# Patient Record
Sex: Male | Born: 1945
Health system: Southern US, Community
[De-identification: ages and names within clinical notes are randomized; demographics above are authoritative.]

## PROBLEM LIST (undated history)

## (undated) DIAGNOSIS — C140 Malignant neoplasm of pharynx, unspecified: Secondary | ICD-10-CM

## (undated) DIAGNOSIS — E669 Obesity, unspecified: Secondary | ICD-10-CM

## (undated) DIAGNOSIS — M47815 Spondylosis without myelopathy or radiculopathy, thoracolumbar region: Secondary | ICD-10-CM

## (undated) DIAGNOSIS — J439 Emphysema, unspecified: Secondary | ICD-10-CM

## (undated) DIAGNOSIS — K5790 Diverticulosis of intestine, part unspecified, without perforation or abscess without bleeding: Secondary | ICD-10-CM

## (undated) DIAGNOSIS — I5020 Unspecified systolic (congestive) heart failure: Secondary | ICD-10-CM

## (undated) DIAGNOSIS — M1990 Unspecified osteoarthritis, unspecified site: Secondary | ICD-10-CM

## (undated) DIAGNOSIS — D494 Neoplasm of unspecified behavior of bladder: Secondary | ICD-10-CM

## (undated) DIAGNOSIS — E119 Type 2 diabetes mellitus without complications: Secondary | ICD-10-CM

## (undated) DIAGNOSIS — N400 Enlarged prostate without lower urinary tract symptoms: Secondary | ICD-10-CM

## (undated) DIAGNOSIS — I452 Bifascicular block: Secondary | ICD-10-CM

## (undated) DIAGNOSIS — I6789 Other cerebrovascular disease: Secondary | ICD-10-CM

## (undated) DIAGNOSIS — R251 Tremor, unspecified: Secondary | ICD-10-CM

## (undated) DIAGNOSIS — I2510 Atherosclerotic heart disease of native coronary artery without angina pectoris: Secondary | ICD-10-CM

## (undated) DIAGNOSIS — E1142 Type 2 diabetes mellitus with diabetic polyneuropathy: Secondary | ICD-10-CM

## (undated) DIAGNOSIS — I700 Atherosclerosis of aorta: Secondary | ICD-10-CM

## (undated) DIAGNOSIS — I10 Essential (primary) hypertension: Secondary | ICD-10-CM

## (undated) DIAGNOSIS — M5440 Lumbago with sciatica, unspecified side: Secondary | ICD-10-CM

## (undated) DIAGNOSIS — Z7982 Long term (current) use of aspirin: Secondary | ICD-10-CM

## (undated) DIAGNOSIS — G629 Polyneuropathy, unspecified: Secondary | ICD-10-CM

## (undated) DIAGNOSIS — H9190 Unspecified hearing loss, unspecified ear: Secondary | ICD-10-CM

## (undated) DIAGNOSIS — M47812 Spondylosis without myelopathy or radiculopathy, cervical region: Secondary | ICD-10-CM

## (undated) DIAGNOSIS — I6523 Occlusion and stenosis of bilateral carotid arteries: Secondary | ICD-10-CM

## (undated) DIAGNOSIS — Z87442 Personal history of urinary calculi: Secondary | ICD-10-CM

## (undated) DIAGNOSIS — K439 Ventral hernia without obstruction or gangrene: Secondary | ICD-10-CM

## (undated) DIAGNOSIS — M549 Dorsalgia, unspecified: Secondary | ICD-10-CM

## (undated) DIAGNOSIS — C679 Malignant neoplasm of bladder, unspecified: Secondary | ICD-10-CM

## (undated) DIAGNOSIS — C4491 Basal cell carcinoma of skin, unspecified: Secondary | ICD-10-CM

## (undated) DIAGNOSIS — C649 Malignant neoplasm of unspecified kidney, except renal pelvis: Secondary | ICD-10-CM

## (undated) DIAGNOSIS — G8929 Other chronic pain: Secondary | ICD-10-CM

## (undated) DIAGNOSIS — I509 Heart failure, unspecified: Secondary | ICD-10-CM

## (undated) HISTORY — PX: COLONOSCOPY: SHX174

## (undated) HISTORY — PX: OTHER SURGICAL HISTORY: SHX169

## (undated) HISTORY — PX: APPENDECTOMY: SHX54

## (undated) HISTORY — PX: CERVICAL SPINE SURGERY: SHX589

## (undated) HISTORY — PX: NEPHRECTOMY: SHX65

## (undated) HISTORY — PX: BACK SURGERY: SHX140

## (undated) HISTORY — PX: BLADDER SURGERY: SHX569

## (undated) HISTORY — PX: HERNIA REPAIR: SHX51

---

## 1988-12-28 HISTORY — PX: THROAT SURGERY: SHX803

## 2005-07-31 ENCOUNTER — Ambulatory Visit: Payer: Self-pay

## 2005-09-03 ENCOUNTER — Other Ambulatory Visit: Payer: Self-pay

## 2005-09-17 ENCOUNTER — Ambulatory Visit: Payer: Self-pay | Admitting: Urology

## 2005-10-13 ENCOUNTER — Inpatient Hospital Stay: Payer: Self-pay | Admitting: Urology

## 2005-11-06 ENCOUNTER — Ambulatory Visit: Payer: Self-pay | Admitting: Oncology

## 2005-11-27 ENCOUNTER — Ambulatory Visit: Payer: Self-pay | Admitting: Oncology

## 2005-12-28 ENCOUNTER — Ambulatory Visit: Payer: Self-pay | Admitting: Oncology

## 2006-01-28 ENCOUNTER — Ambulatory Visit: Payer: Self-pay | Admitting: Oncology

## 2006-02-12 ENCOUNTER — Ambulatory Visit: Payer: Self-pay

## 2006-05-19 ENCOUNTER — Ambulatory Visit: Payer: Self-pay | Admitting: Oncology

## 2006-05-28 ENCOUNTER — Ambulatory Visit: Payer: Self-pay | Admitting: Oncology

## 2006-06-24 ENCOUNTER — Ambulatory Visit: Payer: Self-pay | Admitting: Physician Assistant

## 2006-06-27 ENCOUNTER — Ambulatory Visit: Payer: Self-pay | Admitting: Oncology

## 2006-07-22 ENCOUNTER — Other Ambulatory Visit: Payer: Self-pay

## 2006-07-29 ENCOUNTER — Ambulatory Visit: Payer: Self-pay | Admitting: Urology

## 2006-10-18 ENCOUNTER — Ambulatory Visit: Payer: Self-pay | Admitting: Oncology

## 2006-11-10 ENCOUNTER — Ambulatory Visit: Payer: Self-pay | Admitting: Gastroenterology

## 2007-01-06 ENCOUNTER — Ambulatory Visit: Payer: Self-pay | Admitting: Pain Medicine

## 2007-01-19 ENCOUNTER — Ambulatory Visit: Payer: Self-pay | Admitting: Pain Medicine

## 2007-02-01 ENCOUNTER — Ambulatory Visit: Payer: Self-pay | Admitting: Oncology

## 2007-02-26 ENCOUNTER — Ambulatory Visit: Payer: Self-pay | Admitting: Oncology

## 2007-03-03 ENCOUNTER — Ambulatory Visit: Payer: Self-pay | Admitting: Pain Medicine

## 2007-03-04 ENCOUNTER — Ambulatory Visit: Payer: Self-pay | Admitting: Urology

## 2007-03-14 ENCOUNTER — Ambulatory Visit: Payer: Self-pay | Admitting: Pain Medicine

## 2007-05-31 ENCOUNTER — Ambulatory Visit: Payer: Self-pay | Admitting: Urology

## 2007-05-31 ENCOUNTER — Other Ambulatory Visit: Payer: Self-pay

## 2007-06-14 ENCOUNTER — Ambulatory Visit: Payer: Self-pay | Admitting: Urology

## 2007-07-12 ENCOUNTER — Inpatient Hospital Stay: Payer: Self-pay | Admitting: Urology

## 2009-04-26 ENCOUNTER — Ambulatory Visit: Payer: Self-pay | Admitting: Urology

## 2010-12-17 ENCOUNTER — Ambulatory Visit: Payer: Self-pay | Admitting: Internal Medicine

## 2011-05-06 ENCOUNTER — Ambulatory Visit: Payer: Self-pay | Admitting: Urology

## 2012-10-10 DIAGNOSIS — N138 Other obstructive and reflux uropathy: Secondary | ICD-10-CM | POA: Insufficient documentation

## 2013-07-07 ENCOUNTER — Ambulatory Visit: Payer: Self-pay | Admitting: Urology

## 2013-07-07 DIAGNOSIS — R053 Chronic cough: Secondary | ICD-10-CM | POA: Insufficient documentation

## 2013-07-07 DIAGNOSIS — Z8554 Personal history of malignant neoplasm of ureter: Secondary | ICD-10-CM | POA: Insufficient documentation

## 2014-01-05 ENCOUNTER — Ambulatory Visit: Payer: Self-pay | Admitting: Gastroenterology

## 2014-01-09 LAB — PATHOLOGY REPORT

## 2014-07-04 DIAGNOSIS — K439 Ventral hernia without obstruction or gangrene: Secondary | ICD-10-CM | POA: Insufficient documentation

## 2014-07-04 DIAGNOSIS — E785 Hyperlipidemia, unspecified: Secondary | ICD-10-CM | POA: Insufficient documentation

## 2014-07-04 DIAGNOSIS — E119 Type 2 diabetes mellitus without complications: Secondary | ICD-10-CM | POA: Insufficient documentation

## 2014-07-04 DIAGNOSIS — G250 Essential tremor: Secondary | ICD-10-CM | POA: Insufficient documentation

## 2014-07-04 DIAGNOSIS — I10 Essential (primary) hypertension: Secondary | ICD-10-CM | POA: Insufficient documentation

## 2014-07-04 DIAGNOSIS — M549 Dorsalgia, unspecified: Secondary | ICD-10-CM | POA: Insufficient documentation

## 2016-09-01 DIAGNOSIS — D414 Neoplasm of uncertain behavior of bladder: Secondary | ICD-10-CM | POA: Insufficient documentation

## 2016-10-29 DIAGNOSIS — D692 Other nonthrombocytopenic purpura: Secondary | ICD-10-CM | POA: Insufficient documentation

## 2017-04-29 DIAGNOSIS — Z86010 Personal history of colonic polyps: Secondary | ICD-10-CM | POA: Insufficient documentation

## 2017-09-08 DIAGNOSIS — Z85528 Personal history of other malignant neoplasm of kidney: Secondary | ICD-10-CM | POA: Insufficient documentation

## 2017-11-02 DIAGNOSIS — Z0000 Encounter for general adult medical examination without abnormal findings: Secondary | ICD-10-CM | POA: Insufficient documentation

## 2017-11-09 ENCOUNTER — Encounter: Payer: Self-pay | Admitting: *Deleted

## 2017-11-10 ENCOUNTER — Ambulatory Visit
Admission: RE | Admit: 2017-11-10 | Discharge: 2017-11-10 | Disposition: A | Discharge status: Home / Self Care | Payer: Medicare PPO | Source: Ambulatory Visit | Attending: Internal Medicine | Admitting: Internal Medicine

## 2017-11-10 ENCOUNTER — Ambulatory Visit: Payer: Medicare PPO | Admitting: Anesthesiology

## 2017-11-10 ENCOUNTER — Encounter: Payer: Self-pay | Admitting: Anesthesiology

## 2017-11-10 ENCOUNTER — Encounter
Admission: RE | Disposition: A | Discharge status: Home / Self Care | Payer: Self-pay | Source: Ambulatory Visit | Attending: Internal Medicine

## 2017-11-10 DIAGNOSIS — Z79899 Other long term (current) drug therapy: Secondary | ICD-10-CM | POA: Insufficient documentation

## 2017-11-10 DIAGNOSIS — Z8521 Personal history of malignant neoplasm of larynx: Secondary | ICD-10-CM | POA: Insufficient documentation

## 2017-11-10 DIAGNOSIS — Z8551 Personal history of malignant neoplasm of bladder: Secondary | ICD-10-CM | POA: Insufficient documentation

## 2017-11-10 DIAGNOSIS — K5730 Diverticulosis of large intestine without perforation or abscess without bleeding: Secondary | ICD-10-CM | POA: Insufficient documentation

## 2017-11-10 DIAGNOSIS — K219 Gastro-esophageal reflux disease without esophagitis: Secondary | ICD-10-CM | POA: Diagnosis not present

## 2017-11-10 DIAGNOSIS — Z87891 Personal history of nicotine dependence: Secondary | ICD-10-CM | POA: Diagnosis not present

## 2017-11-10 DIAGNOSIS — E669 Obesity, unspecified: Secondary | ICD-10-CM | POA: Insufficient documentation

## 2017-11-10 DIAGNOSIS — R1310 Dysphagia, unspecified: Secondary | ICD-10-CM | POA: Diagnosis not present

## 2017-11-10 DIAGNOSIS — K3189 Other diseases of stomach and duodenum: Secondary | ICD-10-CM | POA: Insufficient documentation

## 2017-11-10 DIAGNOSIS — K640 First degree hemorrhoids: Secondary | ICD-10-CM | POA: Insufficient documentation

## 2017-11-10 DIAGNOSIS — Z7982 Long term (current) use of aspirin: Secondary | ICD-10-CM | POA: Diagnosis not present

## 2017-11-10 DIAGNOSIS — Z794 Long term (current) use of insulin: Secondary | ICD-10-CM | POA: Insufficient documentation

## 2017-11-10 DIAGNOSIS — Z1211 Encounter for screening for malignant neoplasm of colon: Secondary | ICD-10-CM | POA: Diagnosis present

## 2017-11-10 DIAGNOSIS — Z85528 Personal history of other malignant neoplasm of kidney: Secondary | ICD-10-CM | POA: Diagnosis not present

## 2017-11-10 DIAGNOSIS — I10 Essential (primary) hypertension: Secondary | ICD-10-CM | POA: Diagnosis not present

## 2017-11-10 DIAGNOSIS — Z86010 Personal history of colonic polyps: Secondary | ICD-10-CM | POA: Insufficient documentation

## 2017-11-10 DIAGNOSIS — E119 Type 2 diabetes mellitus without complications: Secondary | ICD-10-CM | POA: Insufficient documentation

## 2017-11-10 HISTORY — PX: ESOPHAGOGASTRODUODENOSCOPY (EGD) WITH PROPOFOL: SHX5813

## 2017-11-10 HISTORY — DX: Obesity, unspecified: E66.9

## 2017-11-10 HISTORY — DX: Type 2 diabetes mellitus without complications: E11.9

## 2017-11-10 HISTORY — PX: COLONOSCOPY WITH PROPOFOL: SHX5780

## 2017-11-10 HISTORY — DX: Essential (primary) hypertension: I10

## 2017-11-10 HISTORY — DX: Malignant neoplasm of bladder, unspecified: C67.9

## 2017-11-10 HISTORY — DX: Tremor, unspecified: R25.1

## 2017-11-10 HISTORY — DX: Malignant neoplasm of unspecified kidney, except renal pelvis: C64.9

## 2017-11-10 HISTORY — DX: Dorsalgia, unspecified: M54.9

## 2017-11-10 LAB — GLUCOSE, CAPILLARY: GLUCOSE-CAPILLARY: 158 mg/dL — AB (ref 65–99)

## 2017-11-10 SURGERY — ESOPHAGOGASTRODUODENOSCOPY (EGD) WITH PROPOFOL
Anesthesia: General

## 2017-11-10 MED ORDER — GLYCOPYRROLATE 0.2 MG/ML IJ SOLN
INTRAMUSCULAR | Status: AC
  Filled 2017-11-10: qty 1

## 2017-11-10 MED ORDER — SODIUM CHLORIDE 0.9 % IV SOLN
INTRAVENOUS | Status: DC
  Administered 2017-11-10: 1000 mL via INTRAVENOUS

## 2017-11-10 MED ORDER — PROPOFOL 500 MG/50ML IV EMUL
INTRAVENOUS | Status: AC
  Filled 2017-11-10: qty 50

## 2017-11-10 MED ORDER — PROPOFOL 10 MG/ML IV BOLUS
INTRAVENOUS | Status: DC | PRN
  Administered 2017-11-10: 100 mg via INTRAVENOUS

## 2017-11-10 MED ORDER — LIDOCAINE HCL (PF) 2 % IJ SOLN
INTRAMUSCULAR | Status: AC
  Filled 2017-11-10: qty 10

## 2017-11-10 MED ORDER — GLYCOPYRROLATE 0.2 MG/ML IJ SOLN
INTRAMUSCULAR | Status: DC | PRN
Start: 2017-11-10 — End: 2017-11-10
  Administered 2017-11-10: 0.2 mg via INTRAVENOUS

## 2017-11-10 MED ORDER — PROPOFOL 500 MG/50ML IV EMUL
INTRAVENOUS | Status: DC | PRN
  Administered 2017-11-10: 150 ug/kg/min via INTRAVENOUS

## 2017-11-10 MED ORDER — LIDOCAINE HCL (CARDIAC) 20 MG/ML IV SOLN
INTRAVENOUS | Status: DC | PRN
  Administered 2017-11-10: 100 mg via INTRAVENOUS

## 2017-11-10 NOTE — Op Note (Signed)
[facility] Patient Name: [name] Procedure Date: 11/10/2017 7:19 AM MRN: [id number] Account #: [id number] Date of Birth: 04/16/1946 Admit Type: Outpatient Age: 71 Room: [facility] ENDO ROOM 4 Gender: Male Note Status: Finalized Procedure:            Colonoscopy Indications:          High risk colon cancer surveillance: Personal history                        of colonic polyps Providers:            Benay Pike. Alice Reichert MD, MD Referring MD:         Dion Body (Referring MD) Medicines:            Propofol per Anesthesia Complications:        No immediate complications. Procedure:            Pre-Anesthesia Assessment:                       - The risks and benefits of the procedure and the                        sedation options and risks were discussed with the                        patient. All questions were answered and informed                        consent was obtained.                       - Patient identification and proposed procedure were                        verified prior to the procedure by the nurse. The                        procedure was verified in the procedure room.                       - ASA Grade Assessment: III - A patient with severe                        systemic disease.                       - After reviewing the risks and benefits, the patient                        was deemed in satisfactory condition to undergo the                        procedure.                       After obtaining informed consent, the colonoscope was                        passed under direct vision. Throughout the procedure,  the patient's blood pressure, pulse, and oxygen                        saturations were monitored continuously. The                        Colonoscope was introduced through the anus and                        advanced to the the cecum, identified by appendiceal                         orifice and ileocecal valve. The ileocecal valve,                        appendiceal orifice, and rectum were photographed. The                        colonoscopy was performed without difficulty. The                        patient tolerated the procedure well. The quality of                        the bowel preparation was good. Findings:      The perianal and digital rectal examinations were normal.      Multiple small-mouthed diverticula were found in the sigmoid colon.       There was no evidence of diverticular bleeding.      Non-bleeding internal hemorrhoids were found during retroflexion. The       hemorrhoids were Grade I (internal hemorrhoids that do not prolapse). Impression:           - Moderate diverticulosis in the sigmoid colon. There                        was no evidence of diverticular bleeding.                       - Non-bleeding internal hemorrhoids.                       - No specimens collected. Recommendation:       - Repeat colonoscopy in 5 years for surveillance.                       - Return to GI office PRN. Procedure Code(s):    --- Professional ---                       I2979, Colorectal cancer screening; colonoscopy on                        individual at high risk Diagnosis Code(s):    --- Professional ---                       Z86.010, Personal history of colonic polyps                       K64.0, First degree hemorrhoids  K57.30, Diverticulosis of large intestine without                        perforation or abscess without bleeding CPT copyright 2016 American [employer]. All rights reserved. The codes documented in this report are preliminary and upon coder review may  be revised to meet current compliance requirements. Efrain Sella MD, MD 11/10/2017 8:37:57 AM This report has been signed electronically. Number of Addenda: 0 Note Initiated On: 11/10/2017 7:19 AM      [facility]

## 2017-11-10 NOTE — Transfer of Care (Signed)
Immediate Anesthesia Transfer of Care Note  Patient: [name]  Procedure(s) Performed: ESOPHAGOGASTRODUODENOSCOPY (EGD) WITH PROPOFOL (N/A ) COLONOSCOPY WITH PROPOFOL (N/A )  Patient Location: Endoscopy Unit  Anesthesia Type:General  Level of Consciousness: sedated  Airway & Oxygen Therapy: Patient Spontanous Breathing and Patient connected to nasal cannula oxygen  Post-op Assessment: Report given to RN and Post -op Vital signs reviewed and stable  Post vital signs: Reviewed and stable  Last Vitals:  Vitals:   11/10/17 0706  BP: (!) 163/84  Pulse: 70  Resp: 16  Temp: (!) 35.7 C  SpO2: 99%    Last Pain:  Vitals:   11/10/17 0706  TempSrc: Tympanic         Complications: No apparent anesthesia complications

## 2017-11-10 NOTE — Op Note (Signed)
[facility] Gastroenterology Patient Name: [name] Procedure Date: 11/10/2017 7:19 AM MRN: [id number] Account #: [id number] Date of Birth: 10/18/1946 Admit Type: Outpatient Age: 71 Room: [facility] ENDO ROOM 4 Gender: Male Note Status: Finalized Procedure:            Upper GI endoscopy Indications:          Dysphagia Providers:            Benay Pike. Alice Reichert MD, MD Referring MD:         Dion Body (Referring MD) Medicines:            Propofol per Anesthesia Complications:        No immediate complications. Procedure:            Pre-Anesthesia Assessment:                       - Prior to the procedure, a History and Physical was                        performed, and patient medications and allergies were                        reviewed. The risks and benefits of the procedure and                        the sedation options and risks were discussed with the                        patient. All questions were answered and informed                        consent was obtained. Patient identification and                        proposed procedure were verified [Verifying Personnel]                        [Verification]. [Mental Status Exam]. [Airway Exam].                        [Respiratory Exam]. [CV Exam]. The patient [Abx                        Prophylaxis Requirement] prophylactic antibiotics [High                        Risk History Reason] and [High Risk Procedure Reason].                        [Anticoagulant Agents] [GHWE Prior to [place]. [ASA                        Grade]. After reviewing the risks and benefits, the                        patient was deemed in satisfactory condition to undergo                        the procedure. [Anesthesia Plan]. Immediately prior to  administration of medications, the patient was                        re-assessed for adequacy to receive sedatives. The                        heart rate,  respiratory rate, oxygen saturations, blood                        pressure, adequacy of pulmonary ventilation, and                        response to care were monitored throughout the                        procedure. The physical status of the patient was                        re-assessed after the procedure.                       - The risks and benefits of the procedure and the                        sedation options and risks were discussed with the                        patient. All questions were answered and informed                        consent was obtained.                       - Patient identification and proposed procedure were                        verified prior to the procedure by the nurse. The                        procedure was verified in the procedure room.                       - ASA Grade Assessment: III - A patient with severe                        systemic disease.                       - After reviewing the risks and benefits, the patient                        was deemed in satisfactory condition to undergo the                        procedure.                       After obtaining informed consent, the endoscope was                        passed under direct vision. Throughout the procedure,  the patient's blood pressure, pulse, and oxygen                        saturations were monitored continuously. The                        Colonoscope was introduced through the mouth, and                        advanced to the third part of duodenum. The upper GI                        endoscopy was accomplished without difficulty. The                        patient tolerated the procedure well. The upper GI                        endoscopy was accomplished without difficulty. The                        patient tolerated the procedure well. Findings:      No endoscopic abnormality was evident in the esophagus to explain the       patient's  complaint of dysphagia.      Localized moderately erythematous mucosa without bleeding was found in       the gastric antrum. Biopsies were taken with a cold forceps for       Helicobacter pylori testing.      The examined duodenum was normal. Impression:           - No endoscopic esophageal abnormality to explain                        patient's dysphagia.                       - Erythematous mucosa in the antrum. Biopsied.                       - Normal examined duodenum. Recommendation:       - Await pathology results.                       - See the other procedure note for documentation of                        additional recommendations. Procedure Code(s):    --- Professional ---                       [id number], Esophagogastroduodenoscopy, flexible, transoral;                        with biopsy, single or multiple Diagnosis Code(s):    --- Professional ---                       K31.89, Other diseases of stomach and duodenum                       R13.10, Dysphagia, unspecified CPT copyright 2016 American Medical Association. All rights reserved. The codes documented in this report are preliminary and  upon coder review may  be revised to meet current compliance requirements. Efrain Sella MD, MD 11/10/2017 8:34:02 AM This report has been signed electronically. Number of Addenda: 0 Note Initiated On: 11/10/2017 7:19 AM Total Procedure Duration: 0 hours 16 minutes 19 seconds       [facility]

## 2017-11-10 NOTE — Anesthesia Post-op Follow-up Note (Signed)
Anesthesia QCDR form completed.        

## 2017-11-10 NOTE — H&P (Signed)
Outpatient short stay form Pre-procedure 11/10/2017 7:54 AM [name], M.D.  Primary Physician: Meriel Flavors, M.D.  Reason for visit:  Dysphagia, personal hx of colon polyps.  History of present illness: Patient is a pleasant time 71 year old male with a long history of GERD. He is status post partial laryngectomy for laryngeal cancer circa 1990. He continues to have oral pharyngeal type dysphagia to solids in the same area of his previous surgery, somewhere inferior to the cricothyroid cartilage region. He denies any hematemesis or weight loss. Patient has a history of multiple adenomas removed in January 2015 during his last colonoscopy. He denies any change in bowel habits rectal bleeding or weight loss.    Current Facility-Administered Medications:  .  0.9 %  sodium chloride infusion, , Intravenous, Continuous, [place], Benay Pike, MD, Last Rate: 20 mL/hr at 11/10/17 0717, 1,000 mL at 11/10/17 4481  Medications Prior to Admission  Medication Sig Dispense Refill Last Dose  . acetaminophen (TYLENOL) 650 MG CR tablet Take 650 mg every 8 (eight) hours as needed by mouth for pain.   Past Week at Unknown time  . amitriptyline (ELAVIL) 25 MG tablet Take 25 mg at bedtime by mouth.   Past Week at Unknown time  . aspirin EC 81 MG tablet Take 81 mg daily by mouth.   Past Week at Unknown time  . atorvastatin (LIPITOR) 40 MG tablet Take 40 mg daily by mouth.   Past Week at Unknown time  . cimetidine (TAGAMET) 400 MG tablet Take 400 mg 2 (two) times daily by mouth.   Past Week at Unknown time  . fluticasone (FLONASE) 50 MCG/ACT nasal spray Place daily into both nostrils.   Past Week at Unknown time  . glimepiride (AMARYL) 4 MG tablet Take 4 mg daily with breakfast by mouth.   Past Week at Unknown time  . hydrochlorothiazide (HYDRODIURIL) 25 MG tablet Take 25 mg daily by mouth.   11/10/2017 at 0500  . ibuprofen (ADVIL,MOTRIN) 200 MG tablet Take 200 mg every 6 (six) hours as needed by mouth.    Past Week at Unknown time  . insulin NPH-regular Human (NOVOLIN 70/30) (70-30) 100 UNIT/ML injection Inject into the skin.   11/10/2017 at 0500  . lisinopril (PRINIVIL,ZESTRIL) 10 MG tablet Take 10 mg daily by mouth.   11/10/2017 at 0500  . NIFEdipine (PROCARDIA-XL/ADALAT-CC/NIFEDICAL-XL) 30 MG 24 hr tablet Take 30 mg daily by mouth.   11/10/2017 at 0500  . propranolol (INDERAL) 40 MG tablet Take 40 mg 3 (three) times daily by mouth.   11/10/2017 at 0500     No Known Allergies   Past Medical History:  Diagnosis Date  . Back pain   . Bladder cancer ([place])   . Cancer ([place])   . Diabetes mellitus without complication ([place])   . Hypertension   . Obesity   . Renal cancer ([place])   . Renal cell cancer ([place])   . Tremor     Review of systems:      Physical Exam  General appearance: alert, cooperative and appears stated age Resp: clear to auscultation bilaterally Cardio: regular rate and rhythm, S1, S2 normal, no murmur, click, rub or gallop GI: soft, non-tender; bowel sounds normal; no masses,  no organomegaly     Planned procedures: EGD and colonoscopy.The patient understands the nature of the planned procedure, indications, risks, alternatives and potential complications including but not limited to bleeding, infection, perforation, damage to internal organs and possible oversedation/side effects from anesthesia. The patient agrees  and gives consent to proceed.  Please refer to procedure notes for findings, recommendations and patient disposition/instructions.    [name], M.D. Gastroenterology 11/10/2017  7:54 AM

## 2017-11-10 NOTE — Anesthesia Postprocedure Evaluation (Signed)
Anesthesia Post Note  Patient: [name]  Procedure(s) Performed: ESOPHAGOGASTRODUODENOSCOPY (EGD) WITH PROPOFOL (N/A ) COLONOSCOPY WITH PROPOFOL (N/A )  Patient location during evaluation: Endoscopy Anesthesia Type: General Level of consciousness: awake and alert Pain management: pain level controlled Vital Signs Assessment: post-procedure vital signs reviewed and stable Respiratory status: spontaneous breathing, nonlabored ventilation, respiratory function stable and patient connected to nasal cannula oxygen Cardiovascular status: blood pressure returned to baseline and stable Postop Assessment: no apparent nausea or vomiting Anesthetic complications: no     Last Vitals:  Vitals:   11/10/17 0853 11/10/17 0903  BP: 122/80 (!) 148/80  Pulse: 71 72  Resp: 20 20  Temp:    SpO2: 93% 92%    Last Pain:  Vitals:   11/10/17 0833  TempSrc: Tympanic  PainSc: Asleep                 Martha Clan

## 2017-11-10 NOTE — Anesthesia Preprocedure Evaluation (Signed)
Anesthesia Evaluation  Patient identified by MRN, date of birth, ID band Patient awake    Reviewed: Allergy & Precautions, H&P , NPO status , Patient's Chart, lab work & pertinent test results, reviewed documented beta blocker date and time   History of Anesthesia Complications Negative for: history of anesthetic complications  Airway Mallampati: III  TM Distance: >3 FB Neck ROM: full    Dental  (+) Edentulous Upper, Edentulous Lower   Pulmonary neg pulmonary ROS, former smoker,           Cardiovascular Exercise Tolerance: Good hypertension, (-) angina(-) CAD, (-) Past MI, (-) Cardiac Stents and (-) CABG (-) dysrhythmias (-) Valvular Problems/Murmurs     Neuro/Psych negative neurological ROS  negative psych ROS   GI/Hepatic Neg liver ROS, GERD  ,  Endo/Other  diabetes  Renal/GU Renal disease (h/o kidney cancer s/p nephrectomy)  negative genitourinary   Musculoskeletal   Abdominal   Peds  Hematology negative hematology ROS (+)   Anesthesia Other Findings Past Medical History: No date: Back pain No date: Bladder cancer ([facility]) No date: Cancer ([place]) No date: Diabetes mellitus without complication (H[facility]) No date: Hypertension No date: Obesity No date: Renal cancer (H[facility]) No date: Renal cell cancer (H[facility]) No date: Tremor   Reproductive/Obstetrics negative OB ROS                             Anesthesia Physical Anesthesia Plan  ASA: III  Anesthesia Plan: General   Post-op Pain Management:    Induction: Intravenous  PONV Risk Score and Plan: 2 and Propofol infusion  Airway Management Planned: Nasal Cannula  Additional Equipment:   Intra-op Plan:   Post-operative Plan:   Informed Consent: I have reviewed the patients History and Physical, chart, labs and discussed the procedure including the risks, benefits and alternatives for the proposed anesthesia with the patient or authorized  representative who has indicated his/her understanding and acceptance.   Dental Advisory Given  Plan Discussed with: Anesthesiologist, CRNA and Surgeon  Anesthesia Plan Comments:         Anesthesia Quick Evaluation

## 2017-11-11 ENCOUNTER — Encounter: Payer: Self-pay | Admitting: Internal Medicine

## 2017-11-12 LAB — SURGICAL PATHOLOGY

## 2018-03-15 ENCOUNTER — Other Ambulatory Visit: Payer: Self-pay | Admitting: Family Medicine

## 2018-03-15 DIAGNOSIS — R1011 Right upper quadrant pain: Secondary | ICD-10-CM

## 2018-03-15 DIAGNOSIS — R509 Fever, unspecified: Secondary | ICD-10-CM | POA: Diagnosis not present

## 2018-03-15 DIAGNOSIS — K529 Noninfective gastroenteritis and colitis, unspecified: Secondary | ICD-10-CM | POA: Diagnosis not present

## 2018-03-15 DIAGNOSIS — R6889 Other general symptoms and signs: Secondary | ICD-10-CM | POA: Diagnosis not present

## 2018-03-16 ENCOUNTER — Ambulatory Visit: Payer: Medicare PPO

## 2018-03-17 ENCOUNTER — Ambulatory Visit
Admission: RE | Admit: 2018-03-17 | Discharge: 2018-03-17 | Disposition: A | Discharge status: Home / Self Care | Payer: PPO | Source: Ambulatory Visit | Attending: Family Medicine | Admitting: Family Medicine

## 2018-03-17 DIAGNOSIS — K8020 Calculus of gallbladder without cholecystitis without obstruction: Secondary | ICD-10-CM | POA: Diagnosis not present

## 2018-03-17 DIAGNOSIS — K760 Fatty (change of) liver, not elsewhere classified: Secondary | ICD-10-CM | POA: Diagnosis not present

## 2018-03-17 DIAGNOSIS — K828 Other specified diseases of gallbladder: Secondary | ICD-10-CM | POA: Diagnosis not present

## 2018-03-17 DIAGNOSIS — R1011 Right upper quadrant pain: Secondary | ICD-10-CM | POA: Diagnosis not present

## 2018-03-18 DIAGNOSIS — R1011 Right upper quadrant pain: Secondary | ICD-10-CM | POA: Diagnosis not present

## 2018-03-18 DIAGNOSIS — R112 Nausea with vomiting, unspecified: Secondary | ICD-10-CM | POA: Diagnosis not present

## 2018-03-22 ENCOUNTER — Ambulatory Visit: Payer: Self-pay

## 2018-03-22 ENCOUNTER — Other Ambulatory Visit: Payer: Self-pay

## 2018-03-22 ENCOUNTER — Inpatient Hospital Stay
Admission: AD | Admit: 2018-03-22 | Discharge: 2018-03-25 | DRG: 418 | Disposition: A | Discharge status: Home / Self Care | Payer: PPO | Source: Ambulatory Visit | Attending: General Surgery | Admitting: General Surgery

## 2018-03-22 DIAGNOSIS — K810 Acute cholecystitis: Secondary | ICD-10-CM | POA: Diagnosis not present

## 2018-03-22 DIAGNOSIS — E119 Type 2 diabetes mellitus without complications: Secondary | ICD-10-CM | POA: Diagnosis present

## 2018-03-22 DIAGNOSIS — E6609 Other obesity due to excess calories: Secondary | ICD-10-CM | POA: Diagnosis present

## 2018-03-22 DIAGNOSIS — Z79899 Other long term (current) drug therapy: Secondary | ICD-10-CM

## 2018-03-22 DIAGNOSIS — K8010 Calculus of gallbladder with chronic cholecystitis without obstruction: Secondary | ICD-10-CM | POA: Diagnosis not present

## 2018-03-22 DIAGNOSIS — M549 Dorsalgia, unspecified: Secondary | ICD-10-CM | POA: Diagnosis not present

## 2018-03-22 DIAGNOSIS — Z981 Arthrodesis status: Secondary | ICD-10-CM | POA: Diagnosis not present

## 2018-03-22 DIAGNOSIS — Z01810 Encounter for preprocedural cardiovascular examination: Secondary | ICD-10-CM | POA: Diagnosis not present

## 2018-03-22 DIAGNOSIS — E785 Hyperlipidemia, unspecified: Secondary | ICD-10-CM | POA: Diagnosis present

## 2018-03-22 DIAGNOSIS — I4891 Unspecified atrial fibrillation: Secondary | ICD-10-CM | POA: Diagnosis not present

## 2018-03-22 DIAGNOSIS — Z85528 Personal history of other malignant neoplasm of kidney: Secondary | ICD-10-CM

## 2018-03-22 DIAGNOSIS — Z86010 Personal history of colonic polyps: Secondary | ICD-10-CM

## 2018-03-22 DIAGNOSIS — Z87891 Personal history of nicotine dependence: Secondary | ICD-10-CM

## 2018-03-22 DIAGNOSIS — K8000 Calculus of gallbladder with acute cholecystitis without obstruction: Secondary | ICD-10-CM | POA: Diagnosis not present

## 2018-03-22 DIAGNOSIS — K8021 Calculus of gallbladder without cholecystitis with obstruction: Secondary | ICD-10-CM | POA: Diagnosis not present

## 2018-03-22 DIAGNOSIS — G8929 Other chronic pain: Secondary | ICD-10-CM | POA: Diagnosis not present

## 2018-03-22 DIAGNOSIS — Z6836 Body mass index (BMI) 36.0-36.9, adult: Secondary | ICD-10-CM | POA: Diagnosis not present

## 2018-03-22 DIAGNOSIS — Z8249 Family history of ischemic heart disease and other diseases of the circulatory system: Secondary | ICD-10-CM

## 2018-03-22 DIAGNOSIS — I10 Essential (primary) hypertension: Secondary | ICD-10-CM | POA: Diagnosis present

## 2018-03-22 DIAGNOSIS — Z794 Long term (current) use of insulin: Secondary | ICD-10-CM | POA: Diagnosis not present

## 2018-03-22 DIAGNOSIS — Z791 Long term (current) use of non-steroidal anti-inflammatories (NSAID): Secondary | ICD-10-CM

## 2018-03-22 DIAGNOSIS — E860 Dehydration: Secondary | ICD-10-CM | POA: Diagnosis not present

## 2018-03-22 DIAGNOSIS — E7800 Pure hypercholesterolemia, unspecified: Secondary | ICD-10-CM | POA: Diagnosis present

## 2018-03-22 DIAGNOSIS — I480 Paroxysmal atrial fibrillation: Secondary | ICD-10-CM | POA: Diagnosis not present

## 2018-03-22 DIAGNOSIS — Z905 Acquired absence of kidney: Secondary | ICD-10-CM

## 2018-03-22 DIAGNOSIS — R932 Abnormal findings on diagnostic imaging of liver and biliary tract: Secondary | ICD-10-CM | POA: Diagnosis not present

## 2018-03-22 DIAGNOSIS — H5462 Unqualified visual loss, left eye, normal vision right eye: Secondary | ICD-10-CM | POA: Diagnosis present

## 2018-03-22 DIAGNOSIS — C649 Malignant neoplasm of unspecified kidney, except renal pelvis: Secondary | ICD-10-CM | POA: Diagnosis not present

## 2018-03-22 DIAGNOSIS — I471 Supraventricular tachycardia: Secondary | ICD-10-CM | POA: Diagnosis not present

## 2018-03-22 DIAGNOSIS — D692 Other nonthrombocytopenic purpura: Secondary | ICD-10-CM | POA: Diagnosis present

## 2018-03-22 DIAGNOSIS — Z833 Family history of diabetes mellitus: Secondary | ICD-10-CM

## 2018-03-22 DIAGNOSIS — Z7982 Long term (current) use of aspirin: Secondary | ICD-10-CM

## 2018-03-22 DIAGNOSIS — R109 Unspecified abdominal pain: Secondary | ICD-10-CM

## 2018-03-22 DIAGNOSIS — G20 Parkinson's disease: Secondary | ICD-10-CM | POA: Diagnosis not present

## 2018-03-22 DIAGNOSIS — I4892 Unspecified atrial flutter: Secondary | ICD-10-CM | POA: Diagnosis not present

## 2018-03-22 DIAGNOSIS — Z85819 Personal history of malignant neoplasm of unspecified site of lip, oral cavity, and pharynx: Secondary | ICD-10-CM | POA: Diagnosis not present

## 2018-03-22 DIAGNOSIS — I499 Cardiac arrhythmia, unspecified: Secondary | ICD-10-CM | POA: Diagnosis not present

## 2018-03-22 DIAGNOSIS — R748 Abnormal levels of other serum enzymes: Secondary | ICD-10-CM | POA: Diagnosis present

## 2018-03-22 DIAGNOSIS — K219 Gastro-esophageal reflux disease without esophagitis: Secondary | ICD-10-CM | POA: Diagnosis present

## 2018-03-22 DIAGNOSIS — N400 Enlarged prostate without lower urinary tract symptoms: Secondary | ICD-10-CM | POA: Diagnosis present

## 2018-03-22 DIAGNOSIS — Z8551 Personal history of malignant neoplasm of bladder: Secondary | ICD-10-CM

## 2018-03-22 DIAGNOSIS — K8020 Calculus of gallbladder without cholecystitis without obstruction: Secondary | ICD-10-CM | POA: Diagnosis not present

## 2018-03-22 HISTORY — DX: Malignant neoplasm of pharynx, unspecified: C14.0

## 2018-03-22 LAB — BASIC METABOLIC PANEL
Anion gap: 9 (ref 5–15)
BUN: 7 mg/dL (ref 6–20)
CALCIUM: 8.7 mg/dL — AB (ref 8.9–10.3)
CO2: 26 mmol/L (ref 22–32)
CREATININE: 0.85 mg/dL (ref 0.61–1.24)
Chloride: 102 mmol/L (ref 101–111)
GFR calc non Af Amer: >60 mL/min (ref >60)
GLUCOSE: 117 mg/dL — AB (ref 65–99)
Potassium: 3.5 mmol/L (ref 3.5–5.1)
Sodium: 137 mmol/L (ref 135–145)

## 2018-03-22 LAB — SURGICAL PCR SCREEN
MRSA, PCR: NEGATIVE
Staphylococcus aureus: NEGATIVE

## 2018-03-22 LAB — GLUCOSE, CAPILLARY: Glucose-Capillary: 135 mg/dL — ABNORMAL HIGH (ref 65–99)

## 2018-03-22 MED ORDER — ENOXAPARIN SODIUM 40 MG/0.4ML ~~LOC~~ SOLN
40.0000 mg | SUBCUTANEOUS | Status: DC
  Administered 2018-03-23 – 2018-03-25 (×3): 40 mg via SUBCUTANEOUS
  Filled 2018-03-22 (×3): qty 0.4

## 2018-03-22 MED ORDER — PROPRANOLOL HCL 40 MG PO TABS
40.0000 mg | ORAL_TABLET | Freq: Three times a day (TID) | ORAL | Status: DC
  Administered 2018-03-22 – 2018-03-23 (×2): 40 mg via ORAL
  Filled 2018-03-22 (×3): qty 1
  Filled 2018-03-22 (×2): qty 2

## 2018-03-22 MED ORDER — SODIUM CHLORIDE 0.9 % IV SOLN
INTRAVENOUS | Status: DC
  Administered 2018-03-22 – 2018-03-23 (×3): via INTRAVENOUS

## 2018-03-22 MED ORDER — FLUTICASONE PROPIONATE 50 MCG/ACT NA SUSP
1.0000 | Freq: Every day | NASAL | Status: DC
  Administered 2018-03-23: 1 via NASAL
  Filled 2018-03-22: qty 16

## 2018-03-22 MED ORDER — PANTOPRAZOLE SODIUM 40 MG IV SOLR
40.0000 mg | Freq: Every day | INTRAVENOUS | Status: DC
  Administered 2018-03-22 – 2018-03-24 (×3): 40 mg via INTRAVENOUS
  Filled 2018-03-22 (×3): qty 40

## 2018-03-22 MED ORDER — AMITRIPTYLINE HCL 25 MG PO TABS
25.0000 mg | ORAL_TABLET | Freq: Every day | ORAL | Status: DC
  Administered 2018-03-22: 25 mg via ORAL
  Filled 2018-03-22 (×2): qty 1

## 2018-03-22 MED ORDER — INSULIN GLARGINE 100 UNIT/ML ~~LOC~~ SOLN
10.0000 [IU] | Freq: Two times a day (BID) | SUBCUTANEOUS | Status: DC
  Administered 2018-03-22 – 2018-03-24 (×5): 10 [IU] via SUBCUTANEOUS
  Filled 2018-03-22 (×7): qty 0.1

## 2018-03-22 MED ORDER — INSULIN ASPART 100 UNIT/ML ~~LOC~~ SOLN: 0.0000 [IU] | Freq: Every day | SUBCUTANEOUS | Status: DC

## 2018-03-22 MED ORDER — MORPHINE SULFATE (PF) 4 MG/ML IV SOLN
4.0000 mg | INTRAVENOUS | Status: DC | PRN
  Administered 2018-03-22 – 2018-03-23 (×4): 4 mg via INTRAVENOUS
  Filled 2018-03-22 (×4): qty 1

## 2018-03-22 MED ORDER — PIPERACILLIN-TAZOBACTAM 3.375 G IVPB
3.3750 g | Freq: Three times a day (TID) | INTRAVENOUS | Status: DC
  Administered 2018-03-22 – 2018-03-25 (×9): 3.375 g via INTRAVENOUS
  Filled 2018-03-22 (×9): qty 50

## 2018-03-22 MED ORDER — LISINOPRIL 10 MG PO TABS
10.0000 mg | ORAL_TABLET | Freq: Every day | ORAL | Status: DC
  Administered 2018-03-23: 10 mg via ORAL
  Filled 2018-03-22: qty 1

## 2018-03-22 MED ORDER — NIFEDIPINE ER 30 MG PO TB24
30.0000 mg | ORAL_TABLET | Freq: Every day | ORAL | Status: DC
  Administered 2018-03-22 – 2018-03-23 (×2): 30 mg via ORAL
  Filled 2018-03-22 (×2): qty 1

## 2018-03-22 MED ORDER — MUPIROCIN 2 % EX OINT
1.0000 "application " | TOPICAL_OINTMENT | Freq: Two times a day (BID) | CUTANEOUS | Status: DC
  Filled 2018-03-22: qty 22

## 2018-03-22 MED ORDER — INSULIN ASPART 100 UNIT/ML ~~LOC~~ SOLN
0.0000 [IU] | Freq: Three times a day (TID) | SUBCUTANEOUS | Status: DC
  Administered 2018-03-23: 2 [IU] via SUBCUTANEOUS
  Administered 2018-03-23: 1 [IU] via SUBCUTANEOUS
  Administered 2018-03-24: 2 [IU] via SUBCUTANEOUS
  Administered 2018-03-24: 3 [IU] via SUBCUTANEOUS
  Administered 2018-03-24 – 2018-03-25 (×2): 2 [IU] via SUBCUTANEOUS
  Administered 2018-03-25: 7 [IU] via SUBCUTANEOUS
  Filled 2018-03-22 (×7): qty 1

## 2018-03-22 MED ORDER — HYDROCHLOROTHIAZIDE 25 MG PO TABS: 25.0000 mg | ORAL_TABLET | Freq: Every day | ORAL | Status: DC

## 2018-03-22 NOTE — Consult Note (Signed)
[facility] [place] NAME: [name]    MR#:  [id number]  DATE OF BIRTH:  [birth year]  DATE OF ADMISSION:  03/22/2018  PRIMARY CARE PHYSICIAN: Dion Body, MD   REQUESTING/REFERRING PHYSICIAN:  Dr. Herbert Pun  CHIEF COMPLAINT:  No chief complaint on file.   HISTORY OF PRESENT ILLNESS:  [name]  is a 72 y.o. male with a known history of hypertension, insulin-dependent diabetes mellitus, history of renal cell cancer status post right nephrectomy, remote history of throat cancer and essential tremors comes to hospital secondary to worsening epigastric pain radiating to right upper quadrant and also nausea. Symptoms going on almost for 2 weeks now.Ultrasound here showing cholelithiasis. Admitted to surgical service for possible cholecystectomy. Medical consult requested for clearance. Patient denies any cardiac history, no chest pain, active at baseline. Labs are pending at this time.Denies any complaints other than abdominal pain and nausea. No recent illnesses.  PAST MEDICAL HISTORY:   Past Medical History:  Diagnosis Date  . Back pain   . Bladder cancer ([place])   . Diabetes mellitus without complication ([place])   . Hypertension   . Obesity   . Renal cancer ([place])   . Renal cell cancer ([place])   . Throat cancer ([place])   . Tremor     PAST SURGICAL HISTOIRY:   Past Surgical History:  Procedure Laterality Date  . APPENDECTOMY    . BLADDER SURGERY    . CERVICAL SPINE SURGERY    . COLONOSCOPY    . COLONOSCOPY WITH PROPOFOL N/A 11/10/2017   Procedure: COLONOSCOPY WITH PROPOFOL;  Surgeon: Toledo, Benay Pike, MD;  Location: [name] ENDOSCOPY;  Service: Gastroenterology;  Laterality: N/A;  . ESOPHAGOGASTRODUODENOSCOPY (EGD) WITH PROPOFOL N/A 11/10/2017   Procedure: ESOPHAGOGASTRODUODENOSCOPY (EGD) WITH PROPOFOL;  Surgeon: Toledo, Benay Pike, MD;  Location: [name] ENDOSCOPY;  Service: Gastroenterology;  Laterality: N/A;   . HERNIA REPAIR    . left od surgery    . NEPHRECTOMY     right kidney nephrectomy  . rt elbow surgery    . THROAT SURGERY  1990   for cancer removal at [facility]:   Social History   Tobacco Use  . Smoking status: Former Research scientist (life sciences)  . Smokeless tobacco: Never Used  Substance Use Topics  . Alcohol use: Yes    Frequency: Never    Comment: Occasional beer drinker    FAMILY HISTORY:   Family History  Problem Relation Age of Onset  . Diabetes Brother   . Hypertension Brother   . CAD Brother   . CAD Brother     DRUG ALLERGIES:  No Known Allergies  REVIEW OF SYSTEMS:   Review of Systems  Constitutional: Negative for chills, fever, malaise/fatigue and weight loss.  HENT: Negative for ear discharge, ear pain, hearing loss and nosebleeds.   Eyes: Negative for blurred vision, double vision and photophobia.  Respiratory: Negative for cough, hemoptysis, shortness of breath and wheezing.   Cardiovascular: Negative for chest pain, palpitations, orthopnea and leg swelling.  Gastrointestinal: Positive for abdominal pain and nausea. Negative for constipation, diarrhea, heartburn, melena and vomiting.  Genitourinary: Negative for dysuria, frequency, hematuria and urgency.  Musculoskeletal: Negative for back pain, myalgias and neck pain.  Skin: Negative for rash.  Neurological: Negative for dizziness, tremors, sensory change, speech change, focal weakness and headaches.  Endo/Heme/Allergies: Does not bruise/bleed easily.  Psychiatric/Behavioral: Negative for depression.    MEDICATIONS AT HOME:   Prior to  Admission medications   Medication Sig Start Date End Date Taking? Authorizing Provider  aspirin EC 81 MG tablet Take 81 mg daily by mouth.   Yes [provider]  acetaminophen (TYLENOL) 650 MG CR tablet Take 650 mg every 8 (eight) hours as needed by mouth for pain.    [provider]  amitriptyline (ELAVIL) 25 MG tablet Take 25 mg at  bedtime by mouth.    [provider]  atorvastatin (LIPITOR) 40 MG tablet Take 40 mg daily by mouth.    [provider]  cimetidine (TAGAMET) 400 MG tablet Take 400 mg 2 (two) times daily by mouth.    [provider]  fluticasone (FLONASE) 50 MCG/ACT nasal spray Place daily into both nostrils.    [provider]  glimepiride (AMARYL) 4 MG tablet Take 4 mg daily with breakfast by mouth.    [provider]  hydrochlorothiazide (HYDRODIURIL) 25 MG tablet Take 25 mg daily by mouth.    [provider]  ibuprofen (ADVIL,MOTRIN) 200 MG tablet Take 200 mg every 6 (six) hours as needed by mouth.    [provider]  insulin NPH-regular Human (NOVOLIN 70/30) (70-30) 100 UNIT/ML injection Inject into the skin.    [provider]  lisinopril (PRINIVIL,ZESTRIL) 10 MG tablet Take 10 mg daily by mouth.    [provider]  NIFEdipine (PROCARDIA-XL/ADALAT-CC/NIFEDICAL-XL) 30 MG 24 hr tablet Take 30 mg daily by mouth.    [provider]  propranolol (INDERAL) 40 MG tablet Take 40 mg 3 (three) times daily by mouth.    [provider]      VITAL SIGNS:  Blood pressure (!) 169/83, pulse 74, temperature 98.1 F (36.7 C), temperature source Oral, resp. rate 18, height 6' (1.829 m), weight 109.6 kg (241 lb 10 oz), SpO2 98 %.  PHYSICAL EXAMINATION:   Physical Exam  GENERAL:  72 y.o.-year-old obese patient lying in the bed with no acute distress.  EYES: Pupils equal, round, reactive to light and accommodation. No scleral icterus. Extraocular muscles intact.  HEENT: Head atraumatic, normocephalic. Oropharynx and nasopharynx clear.  NECK:  Supple, no jugular venous distention. No thyroid enlargement, no tenderness.  LUNGS: Normal breath sounds bilaterally, no wheezing, rales,rhonchi or crepitation. No use of accessory muscles of respiration.  CARDIOVASCULAR: S1, S2 normal. No murmurs, rubs, or gallops.  ABDOMEN: Soft,  tender in the epigastric region and right upper quadrant with voluntary guarding, nondistended. Bowel sounds present. No organomegaly or mass.  EXTREMITIES: No pedal edema, cyanosis, or clubbing.  NEUROLOGIC: Cranial nerves II through XII are intact. Muscle strength 5/5 in all extremities. Sensation intact. Gait not checked.  PSYCHIATRIC: The patient is alert and oriented x 3.  SKIN: No obvious rash, lesion, or ulcer.   LABORATORY PANEL:   CBC No results for input(s): WBC, HGB, HCT, PLT in the last 168 hours. ------------------------------------------------------------------------------------------------------------------  Chemistries  No results for input(s): NA, K, CL, CO2, GLUCOSE, BUN, CREATININE, CALCIUM, MG, AST, ALT, ALKPHOS, BILITOT in the last 168 hours.  Invalid input(s): GFRCGP ------------------------------------------------------------------------------------------------------------------  Cardiac Enzymes No results for input(s): TROPONINI in the last 168 hours. ------------------------------------------------------------------------------------------------------------------  RADIOLOGY:  No results found.  EKG:   Orders placed or performed during the hospital encounter of 03/22/18  . EKG 12-Lead  . EKG 12-Lead    IMPRESSION AND PLAN:   [name]  is a 72 y.o. male with a known history of hypertension, insulin-dependent diabetes mellitus, history of renal cell cancer status post right nephrectomy, remote history  of throat cancer and essential tremors comes to hospital secondary to worsening epigastric pain radiating to right upper quadrant and also nausea.  1. Acute cholelithiasis with cholecystitis-admitted to surgical service. -Labs done as outpatient showing elevated LFTs but normal bilirubin. -For laparoscopic cholecystectomy tomorrow. -Lower risk for noncardiac surgery, can proceed with the surgery. -Currently on liquid diet, nothing by mouth after  midnight. -Pain control after surgery and further management per surgical team  2.Insulin-dependent diabetes mellitus-A1c is pending.-Sliding scale insulin, decrease Lantus to 10 units twice a day for now -hold Amaryl for now  3. Hypertension-will hold hydrochlorothiazide. Continue his lisinopril and nifedipine.  4. Essential tremors-on propranolol  5. DVT prophylaxis-Lovenox    All the records are reviewed and case discussed with Consulting provider. Management plans discussed with the patient, family and they are in agreement.  CODE STATUS: full code  TOTAL TIME TAKING CARE OF THIS PATIENT: 50 minutes.    Gladstone Lighter M.D on 03/22/2018 at 5:58 PM  Between 7am to 6pm - Pager - [phone]  After 6pm go to [email or web address] - password [email or web address] [facility]  [facility]  Office  [phone]  CC: Primary care Physician: Dion Body, MD

## 2018-03-22 NOTE — Progress Notes (Signed)
[facility] received order requisition for HCPOA for patient. [facility] will provide [facility] with information to follow up with patient. Education materials

## 2018-03-22 NOTE — H&P (Signed)
PATIENT PROFILE: [name] is a 72 y.o. male who presents to the Clinic for consultation at the request of Dr. Netty Starring for evaluation of cholelithiasis.  PCP:  Dion Body, MD  HISTORY OF PRESENT ILLNESS: [name] reports having abdominal pain since 3-4 weeks ago. The patient refers that it has been progressively getting worse. Pain starts on the epigastric area and radiates to the right upper quadrant. Pain is associated with nausea and vomiting. Refers one episode of fever couple of weeks ago. Pain is aggravated with any food intake. Pain is better when not eating but does not resolves. Denies changes of bowel habits.    PROBLEM LIST:         Problem List  Date Reviewed: 11/02/2017         Noted   Medicare annual wellness visit, initial: 05/28/15 11/02/2017   Medicare annual wellness visit, subsequent 11/02/17 11/02/2017   History of colon polyps (01/05/14 - repeat 3 yrs) 04/29/2017   Senile purpura (CMS-HCC) 10/29/2016   Vaccine counseling: Td vaccine-09/11/04- pt declines vaccine; PNA-23 vaccine-Fall 2013 per pt- pt declines vaccine; PCV-13 vaccine administered on 06/05/16 (11/02/17) 05/28/2015   Pure hypercholesterolemia (LDL 77 - 10/26/17) Unknown   Insulin dependent type 2 diabetes mellitus (A1c 7.5% - 10/26/17) Unknown   Essential hypertension, benign Unknown   History of throat cancer Unknown   History of bladder cancer - followed by Dr. Jacqlyn Larsen Unknown   History of renal cell cancer Unknown   Class 2 obesity due to excess calories with serious comorbidity and body mass index (BMI) of 36.0 to 36.9 in adult Unknown   Benign essential tremor Unknown   Chronic back pain on chronic amitriptyline Unknown      GENERAL REVIEW OF SYSTEMS:   General ROS: negative for - chills, fatigue, fever, weight gain or weight loss Allergy and Immunology ROS: negative for - hives  Hematological and Lymphatic ROS: negative for - bleeding problems or bruising, negative  for palpable nodes Endocrine ROS: negative for - heat or cold intolerance, hair changes Respiratory ROS: Positive for - cough, Denies shortness of breath or wheezing Cardiovascular ROS: no chest pain or palpitations GI ROS: Positive for nausea, vomiting, abdominal pain, Denies diarrhea, constipation Musculoskeletal ROS: negative for - joint swelling or muscle pain Neurological ROS: negative for - confusion, syncope Dermatological ROS: negative for pruritus and rash Psychiatric: negative for anxiety, depression, difficulty sleeping and memory loss  MEDICATIONS: CurrentMedications        Current Outpatient Medications  Medication Sig Dispense Refill  . acetaminophen (TYLENOL) 650 MG ER tablet Take 1,300 mg by mouth once daily. Takes 2 tablets PO qafternoon    . amitriptyline (ELAVIL) 25 MG tablet TAKE 3 TABLETS BY MOUTH AT BEDTIME 270 tablet 1  . amoxicillin-clavulanate (AUGMENTIN) 500-125 mg tablet Take 1 tablet (500 mg total) by mouth 2 (two) times daily 14 tablet 0  . aspirin 81 MG EC tablet Take 81 mg by mouth once daily.    Marland Kitchen atorvastatin (LIPITOR) 40 MG tablet Take 1 tablet (40 mg total) by mouth nightly 90 tablet 1  . BD INSULIN SYRINGE ULTRA-FINE 0.3 mL 31 gauge x 5/16" syringe USE SUBCUTANEOUSLY TWICE DAILY 200 each 3  . blood glucose diagnostic (GLUCOSE BLOOD) test strip Use as directed Patient needs OneTouch Ultra test strips. Check CBG's twice daily. Dx: E11.9, Z79.4 100 each 11  . blood glucose meter kit Use as directed Patient needs OneTouch Ultra glucometer. Dx: E11.9, Z79.4 1 each 0  . fluticasone (  FLONASE) 50 mcg/actuation nasal spray Place 2 sprays into both nostrils once daily. 16 g 5  . glimepiride (AMARYL) 4 MG tablet Take 1 tablet by mouth every day with a meal 90 tablet 1  . hydroCHLOROthiazide (HYDRODIURIL) 25 MG tablet TAKE 1 TABLET BY MOUTH ONCE A DAY 90 tablet 1  . ibuprofen (ADVIL,MOTRIN) 200 MG tablet Take 200 mg by mouth once daily as needed      .  lancets (ONETOUCH DELICA LANCETS) 30 gauge Misc Use 1 each as directed Check CBG's twice daily. Dx: E11.9, Z79.4 100 each 11  . lisinopril (PRINIVIL,ZESTRIL) 10 MG tablet TAKE 1 TABLET (10 MG TOTAL) BY MOUTH ONCE DAILY. 90 tablet 1  . niFEdipine (PROCARDIA-XL) 30 MG (OSM) XL tablet TAKE 1 TABLET BY MOUTH ONCE A DAY 90 tablet 1  . NOVOLIN 70/30 U-100 INSULIN 100 unit/mL (70-30) injection INJECT 30 UNITS SUBCUTANEOUSLY TWICE DAILY BEFORE A MEAL. 60 mL 1  . ondansetron (ZOFRAN-ODT) 4 MG disintegrating tablet Take 1 tablet (4 mg total) by mouth every 8 (eight) hours as needed for Nausea 20 tablet 0  . peg-electrolyte (NULYTELY) solution Use as directed for colonoscopy 4000 mL 0  . propranolol (INDERAL) 40 MG tablet Take 40 mg by mouth 2 (two) times daily.    . propranolol (INDERAL) 40 MG tablet TAKE 1 TABLET (40 MG TOTAL) BY MOUTH 2 (TWO) TIMES DAILY. 180 tablet 3  . ranitidine (ZANTAC) 150 MG tablet Take 1 tablet (150 mg total) by mouth 2 (two) times daily 60 tablet 3  . tamsulosin (FLOMAX) 0.4 mg capsule Take 0.4 mg by mouth once daily. Take 30 minutes after same meal each day.     No current facility-administered medications for this visit.       ALLERGIES: Patient has no known allergies.  PAST MEDICAL HISTORY:     Past Medical History:  Diagnosis Date  . Benign essential tremor   . Chronic back pain, unspecified   . Essential hypertension, benign   . History of bladder cancer    History of right renal cancer s/p right nephrectomy- followed by Dr. Jacqlyn Larsen  . History of renal cell cancer   . History of throat cancer   . Insulin dependent diabetes mellitus (CMS-HCC)   . Obesity, unspecified   . Other and unspecified hyperlipidemia   . Tremor     PAST SURGICAL HISTORY:      Past Surgical History:  Procedure Laterality Date  . APPENDECTOMY    . COLONOSCOPY  01/05/2014   Adenomatous Polyps & PH Colon Polyps - repeat 3 years per Dr. Rayann Heman  . COLONOSCOPY   11/10/2017   Diverticulosis/PHx CP/Repeat 17yr/TKT  . EGD  11/10/2017   No endoscopic esophageal abnormality no repeat TKT   . INGUINAL HERNIA REPAIR    . Left eye surgery     s/p eye injury with blindness left eye.  .Marland KitchenNEPHRECTOMY Right    s/p right renal cancer  . OTHER SURGERY     Cervical spine fusion  . Right elbow surgery       FAMILY HISTORY:      Family History  Problem Relation Age of Onset  . No Known Problems Mother   . No Known Problems Father   . No Known Problems Brother   . No Known Problems Brother   . No Known Problems Brother   . No Known Problems Brother      SOCIAL HISTORY: Social History  Socioeconomic History  . Marital status: Married    Spouse name: Not on file  . Number of children: Not on file  . Years of education: Not on file  . Highest education level: Not on file  Occupational History  . Not on file  Social Needs  . Financial resource strain: Not on file  . Food insecurity:    Worry: Not on file    Inability: Not on file  . Transportation needs:    Medical: Not on file    Non-medical: Not on file  Tobacco Use  . Smoking status: Former Smoker    Packs/day: 2.50    Years: 19.00    Pack years: 47.50    Types: Cigarettes    Last attempt to quit: 12/29/1987    Years since quitting: 30.2  . Smokeless tobacco: Never Used  Substance and Sexual Activity  . Alcohol use: Yes    Alcohol/week: 0.0 oz    Comment: Occasional- beer  . Drug use: No  . Sexual activity: Defer  Other Topics Concern  . Not on file  Social History Narrative   Marital Status- Married   Lives with wife   Employment- Disability   Exercise hx- Walks on the treadmill 10 minutes 3x/daily   Religious Affiliation- [employer]      PHYSICAL EXAM:    Vitals:   03/22/18 1403  BP: (!) 184/89  Pulse: 72  Temp: 36.8 C (98.3 F)   Body mass index is 34.97 kg/m. Weight: (!) 109.8 kg (242 lb)    GENERAL: Alert, active, oriented x3  HEENT: Pupils equal reactive to light. Extraocular movements are intact. Sclera clear. Palpebral conjunctiva normal red color.Pharynx clear.  NECK: Supple with no palpable mass and no adenopathy.  LUNGS: Sound clear with no rales rhonchi or wheezes.  HEART: Regular rhythm S1 and S2 without murmur.  ABDOMEN: Soft and depressible, tender on right upper quadrant with no palpable mass, no hepatomegaly.   EXTREMITIES: Well-developed well-nourished symmetrical with dependent edema.  NEUROLOGICAL: Awake alert oriented, facial expression symmetrical, moving all extremities.  REVIEW OF DATA: I have reviewed the following data today:      Office Visit on 03/18/2018  Component Date Value  . WBC (White Blood Cell Co* 03/18/2018 8.9   . RBC (Red Blood Cell Coun* 03/18/2018 5.38   . Hemoglobin 03/18/2018 13.7*  . Hematocrit 03/18/2018 43.6   . MCV (Mean Corpuscular Vo* 03/18/2018 81.0   . MCH (Mean Corpuscular He* 03/18/2018 25.5*  . MCHC (Mean Corpuscular H* 03/18/2018 31.4*  . Platelet Count 03/18/2018 224   . RDW-CV (Red Cell Distrib* 03/18/2018 15.4*  . MPV (Mean Platelet Volum* 03/18/2018 11.3   . Neutrophils 03/18/2018 6.38   . Lymphocytes 03/18/2018 1.15   . Monocytes 03/18/2018 0.71   . Eosinophils 03/18/2018 0.60*  . Basophils 03/18/2018 0.04   . Neutrophil % 03/18/2018 71.7*  . Lymphocyte % 03/18/2018 12.9   . Monocyte % 03/18/2018 8.0   . Eosinophil % 03/18/2018 6.7*  . Basophil% 03/18/2018 0.4   . Immature Granulocyte % 03/18/2018 0.3   . Immature Granulocyte Cou* 03/18/2018 0.03   . Glucose 03/18/2018 83   . Sodium 03/18/2018 139   . Potassium 03/18/2018 3.3*  . Chloride 03/18/2018 96*  . Carbon Dioxide (CO2) 03/18/2018 30.4   . Urea Nitrogen (BUN) 03/18/2018 19   . Creatinine 03/18/2018 1.0   . Glomerular Filtration Ra* 03/18/2018 74   . Calcium 03/18/2018 9.1   . AST  03/18/2018 109*  .  ALT  03/18/2018 120*  .  Alk Phos (alkaline Phosp* 03/18/2018 230*  . Albumin 03/18/2018 3.3*  . Bilirubin, Total 03/18/2018 1.6*  . Protein, Total 03/18/2018 6.4   . A/G Ratio 03/18/2018 1.1   Office Visit on 03/15/2018  Component Date Value  . Influenza A Antigen 03/15/2018 Negative   . Influenza B Antigen 03/15/2018 Negative   . WBC (White Blood Cell Co* 03/15/2018 10.4*  . RBC (Red Blood Cell Coun* 03/15/2018 5.66   . Hemoglobin 03/15/2018 14.8   . Hematocrit 03/15/2018 46.1   . MCV (Mean Corpuscular Vo* 03/15/2018 81.4   . MCH (Mean Corpuscular He* 03/15/2018 26.1*  . MCHC (Mean Corpuscular H* 03/15/2018 32.1   . Platelet Count 03/15/2018 184   . RDW-CV (Red Cell Distrib* 03/15/2018 15.3*  . MPV (Mean Platelet Volum* 03/15/2018 11.6   . Neutrophils 03/15/2018 8.58*  . Lymphocytes 03/15/2018 0.72*  . Monocytes 03/15/2018 0.81   . Eosinophils 03/15/2018 0.26   . Basophils 03/15/2018 0.04   . Neutrophil % 03/15/2018 82.1*  . Lymphocyte % 03/15/2018 6.9*  . Monocyte % 03/15/2018 7.8   . Eosinophil % 03/15/2018 2.5   . Basophil% 03/15/2018 0.4   . Immature Granulocyte % 03/15/2018 0.3   . Immature Granulocyte Cou* 03/15/2018 0.03   . Glucose 03/15/2018 203*  . Sodium 03/15/2018 138   . Potassium 03/15/2018 3.5*  . Chloride 03/15/2018 98   . Carbon Dioxide (CO2) 03/15/2018 32.6*  . Urea Nitrogen (BUN) 03/15/2018 26*  . Creatinine 03/15/2018 1.3   . Glomerular Filtration Ra* 03/15/2018 55*  . Calcium 03/15/2018 8.9   . AST  03/15/2018 26   . ALT  03/15/2018 20   . Alk Phos (alkaline Phosp* 03/15/2018 72   . Albumin 03/15/2018 3.4*  . Bilirubin, Total 03/15/2018 1.3*  . Protein, Total 03/15/2018 6.4   . A/G Ratio 03/15/2018 1.1      ASSESSMENT: Mr. Kroft is a 72 y.o. male presenting for consultation for cholelithiasis.    Patient was oriented about the diagnosis of cholelithiasis. Also oriented about what is the gallbladder, its anatomy and function and the implications of having stones.  The patient was oriented about the treatment alternatives. Since patient is getting worse pain and the bilirubin is elevated will order new labs today STAT. After labs will admit patient for IV hydration, MRCP if bilirubin elevated, medical clearance and possible cholecystectomy on admission.   Surgical technique (open vs laparoscopic) was discussed. It was also discussed the goals of the surgery (decrease the pain episodes and avoid the risk of cholecystitis) and the risk of surgery including: bleeding, infection, common bile duct injury, stone retention, injury to other organs such as bowel, liver, stomach, other complications such as hernia, bowel obstruction among others. Also discussed with patient about anesthesia and its complications such as: reaction to medications, pneumonia, heart complications, death, among others.  New labs shows improvement of bilirubin, most likely due to passed stone. Still with elevated Alk Phos.    PLAN: 1. CBC, CMP, PT/PTT/INR - done 2. Will admit to the hospital for IV hydration, Internal medicine clearance and cholecystectomy on same admission  Patient, his wife and daughter verbalized understanding, all questions were answered, and were agreeable with the plan outlined above.   I spent more than 60 minutes on this encounter orienting patient about condition and coordinating plan of care including direct admission to the hospital.   Herbert Pun, MD  Electronically signed by Herbert Pun, MD

## 2018-03-22 NOTE — Progress Notes (Signed)
[employer] spoke with patient and his wife. Patient is having surgery in the morning and wanted HCPOA information. PT was educated and wife is looking over paperwork. Patient spoke about his grandson and the time they spend together on Saturdays. [place] provided education, active listening, and emotional support. A follow up visit with [facility] on-call for tomorrow will be set up.

## 2018-03-23 ENCOUNTER — Inpatient Hospital Stay: Payer: PPO | Admitting: Anesthesiology

## 2018-03-23 ENCOUNTER — Encounter: Payer: Self-pay | Admitting: *Deleted

## 2018-03-23 ENCOUNTER — Encounter
Admission: AD | Disposition: A | Discharge status: Home / Self Care | Payer: Self-pay | Source: Ambulatory Visit | Attending: General Surgery

## 2018-03-23 ENCOUNTER — Inpatient Hospital Stay: Payer: PPO

## 2018-03-23 HISTORY — PX: CHOLECYSTECTOMY: SHX55

## 2018-03-23 LAB — GLUCOSE, CAPILLARY
GLUCOSE-CAPILLARY: 137 mg/dL — AB (ref 65–99)
GLUCOSE-CAPILLARY: 156 mg/dL — AB (ref 65–99)
GLUCOSE-CAPILLARY: 180 mg/dL — AB (ref 65–99)
GLUCOSE-CAPILLARY: 163 mg/dL — AB (ref 65–99)
Glucose-Capillary: 152 mg/dL — ABNORMAL HIGH (ref 65–99)

## 2018-03-23 LAB — HEMOGLOBIN A1C
HEMOGLOBIN A1C: 7.5 % — AB (ref 4.8–5.6)
MEAN PLASMA GLUCOSE: 168.55 mg/dL

## 2018-03-23 LAB — COMPREHENSIVE METABOLIC PANEL
ALBUMIN: 2.8 g/dL — AB (ref 3.5–5.0)
ALK PHOS: 238 U/L — AB (ref 38–126)
ALT: 92 U/L — ABNORMAL HIGH (ref 17–63)
AST: 52 U/L — ABNORMAL HIGH (ref 15–41)
Anion gap: 10 (ref 5–15)
BILIRUBIN TOTAL: 1.3 mg/dL — AB (ref 0.3–1.2)
BUN: 7 mg/dL (ref 6–20)
CALCIUM: 8.5 mg/dL — AB (ref 8.9–10.3)
CO2: 26 mmol/L (ref 22–32)
Chloride: 102 mmol/L (ref 101–111)
Creatinine, Ser: 0.93 mg/dL (ref 0.61–1.24)
GFR calc non Af Amer: >60 mL/min (ref >60)
Glucose, Bld: 134 mg/dL — ABNORMAL HIGH (ref 65–99)
POTASSIUM: 3.3 mmol/L — AB (ref 3.5–5.1)
SODIUM: 138 mmol/L (ref 135–145)
TOTAL PROTEIN: 7.3 g/dL (ref 6.5–8.1)

## 2018-03-23 LAB — PROTIME-INR
INR: 1.18
Prothrombin Time: 14.9 seconds (ref 11.4–15.2)

## 2018-03-23 SURGERY — LAPAROSCOPIC CHOLECYSTECTOMY
Anesthesia: General | Site: Abdomen | Wound class: "Contaminated "

## 2018-03-23 MED ORDER — SUGAMMADEX SODIUM 500 MG/5ML IV SOLN
INTRAVENOUS | Status: DC | PRN
  Administered 2018-03-23: 220 mg via INTRAVENOUS

## 2018-03-23 MED ORDER — PROPOFOL 10 MG/ML IV BOLUS
INTRAVENOUS | Status: DC | PRN
  Administered 2018-03-23: 170 mg via INTRAVENOUS

## 2018-03-23 MED ORDER — LIDOCAINE HCL (PF) 2 % IJ SOLN
INTRAMUSCULAR | Status: AC
  Filled 2018-03-23: qty 10

## 2018-03-23 MED ORDER — ACETAMINOPHEN 325 MG PO TABS: 650.0000 mg | ORAL_TABLET | Freq: Four times a day (QID) | ORAL | Status: DC | PRN

## 2018-03-23 MED ORDER — CEFAZOLIN SODIUM-DEXTROSE 2-3 GM-%(50ML) IV SOLR
INTRAVENOUS | Status: DC | PRN
  Administered 2018-03-23: 2 g via INTRAVENOUS

## 2018-03-23 MED ORDER — TAMSULOSIN HCL 0.4 MG PO CAPS
0.4000 mg | ORAL_CAPSULE | Freq: Every day | ORAL | Status: DC
  Administered 2018-03-24 – 2018-03-25 (×2): 0.4 mg via ORAL
  Filled 2018-03-23 (×2): qty 1

## 2018-03-23 MED ORDER — MORPHINE SULFATE (PF) 4 MG/ML IV SOLN
4.0000 mg | INTRAVENOUS | Status: DC | PRN
  Administered 2018-03-23 – 2018-03-25 (×5): 4 mg via INTRAVENOUS
  Filled 2018-03-23 (×5): qty 1

## 2018-03-23 MED ORDER — ROCURONIUM BROMIDE 50 MG/5ML IV SOLN
INTRAVENOUS | Status: AC
  Filled 2018-03-23: qty 1

## 2018-03-23 MED ORDER — MIDAZOLAM HCL 2 MG/2ML IJ SOLN
INTRAMUSCULAR | Status: DC | PRN
  Administered 2018-03-23: 2 mg via INTRAVENOUS

## 2018-03-23 MED ORDER — GADOBENATE DIMEGLUMINE 529 MG/ML IV SOLN
20.0000 mL | Freq: Once | INTRAVENOUS | Status: AC | PRN
  Administered 2018-03-23: 20 mL via INTRAVENOUS

## 2018-03-23 MED ORDER — SUCCINYLCHOLINE CHLORIDE 20 MG/ML IJ SOLN
INTRAMUSCULAR | Status: DC | PRN
  Administered 2018-03-23: 140 mg via INTRAVENOUS

## 2018-03-23 MED ORDER — PROPOFOL 10 MG/ML IV BOLUS
INTRAVENOUS | Status: AC
  Filled 2018-03-23: qty 20

## 2018-03-23 MED ORDER — FENTANYL CITRATE (PF) 100 MCG/2ML IJ SOLN
INTRAMUSCULAR | Status: DC | PRN
  Administered 2018-03-23: 50 ug via INTRAVENOUS
  Administered 2018-03-23: 100 ug via INTRAVENOUS

## 2018-03-23 MED ORDER — EPHEDRINE SULFATE 50 MG/ML IJ SOLN
INTRAMUSCULAR | Status: DC | PRN
  Administered 2018-03-23: 5 mg via INTRAVENOUS
  Administered 2018-03-23: 10 mg via INTRAVENOUS

## 2018-03-23 MED ORDER — LIDOCAINE HCL (CARDIAC) 20 MG/ML IV SOLN
INTRAVENOUS | Status: DC | PRN
  Administered 2018-03-23: 60 mg via INTRAVENOUS

## 2018-03-23 MED ORDER — EPHEDRINE SULFATE 50 MG/ML IJ SOLN
INTRAMUSCULAR | Status: AC
  Filled 2018-03-23: qty 1

## 2018-03-23 MED ORDER — ONDANSETRON HCL 4 MG/2ML IJ SOLN: 4.0000 mg | Freq: Once | INTRAMUSCULAR | Status: DC | PRN

## 2018-03-23 MED ORDER — FENTANYL CITRATE (PF) 100 MCG/2ML IJ SOLN
25.0000 ug | INTRAMUSCULAR | Status: DC | PRN
  Administered 2018-03-23 (×3): 25 ug via INTRAVENOUS

## 2018-03-23 MED ORDER — POTASSIUM CHLORIDE IN NACL 20-0.45 MEQ/L-% IV SOLN
INTRAVENOUS | Status: DC
  Administered 2018-03-23 – 2018-03-25 (×3): via INTRAVENOUS
  Filled 2018-03-23 (×5): qty 1000

## 2018-03-23 MED ORDER — ONDANSETRON HCL 4 MG/2ML IJ SOLN
INTRAMUSCULAR | Status: DC | PRN
Start: 2018-03-23 — End: 2018-03-23
  Administered 2018-03-23: 4 mg via INTRAVENOUS

## 2018-03-23 MED ORDER — PHENYLEPHRINE HCL 10 MG/ML IJ SOLN
INTRAMUSCULAR | Status: DC | PRN
  Administered 2018-03-23: 25 ug/min via INTRAVENOUS

## 2018-03-23 MED ORDER — SUCCINYLCHOLINE CHLORIDE 20 MG/ML IJ SOLN
INTRAMUSCULAR | Status: AC
  Filled 2018-03-23: qty 1

## 2018-03-23 MED ORDER — ROCURONIUM BROMIDE 100 MG/10ML IV SOLN
INTRAVENOUS | Status: DC | PRN
  Administered 2018-03-23 (×2): 10 mg via INTRAVENOUS
  Administered 2018-03-23: 20 mg via INTRAVENOUS
  Administered 2018-03-23: 5 mg via INTRAVENOUS
  Administered 2018-03-23: 35 mg via INTRAVENOUS

## 2018-03-23 MED ORDER — PHENYLEPHRINE HCL 10 MG/ML IJ SOLN
INTRAMUSCULAR | Status: DC | PRN
Start: 2018-03-23 — End: 2018-03-23
  Administered 2018-03-23 (×6): 100 ug via INTRAVENOUS
  Administered 2018-03-23: 150 ug via INTRAVENOUS

## 2018-03-23 MED ORDER — PHENYLEPHRINE HCL 10 MG/ML IJ SOLN
INTRAMUSCULAR | Status: AC
  Filled 2018-03-23: qty 1

## 2018-03-23 MED ORDER — FENTANYL CITRATE (PF) 250 MCG/5ML IJ SOLN
INTRAMUSCULAR | Status: AC
  Filled 2018-03-23: qty 5

## 2018-03-23 MED ORDER — MIDAZOLAM HCL 2 MG/2ML IJ SOLN
INTRAMUSCULAR | Status: AC
  Filled 2018-03-23: qty 2

## 2018-03-23 MED ORDER — FENTANYL CITRATE (PF) 100 MCG/2ML IJ SOLN
INTRAMUSCULAR | Status: AC
  Administered 2018-03-23: 25 ug via INTRAVENOUS
  Filled 2018-03-23: qty 2

## 2018-03-23 MED ORDER — BUPIVACAINE-EPINEPHRINE (PF) 0.5% -1:200000 IJ SOLN
INTRAMUSCULAR | Status: DC | PRN
  Administered 2018-03-23: 10 mL

## 2018-03-23 SURGICAL SUPPLY — 38 items
"PENCIL ELECTRO HAND CTR " (MISCELLANEOUS) IMPLANT
APPLIER CLIP LOGIC TI 5 (MISCELLANEOUS) ×3 IMPLANT
BLADE SURG SZ11 CARB STEEL (BLADE) ×3 IMPLANT
CANISTER SUCT 1200ML W/VALVE (MISCELLANEOUS) ×3 IMPLANT
CHLORAPREP W/TINT 26ML (MISCELLANEOUS) ×3 IMPLANT
DERMABOND ADVANCED (GAUZE/BANDAGES/DRESSINGS) ×2
DERMABOND ADVANCED .7 DNX12 (GAUZE/BANDAGES/DRESSINGS) ×1 IMPLANT
DRAPE SHEET LG 3/4 BI-LAMINATE (DRAPES) ×3 IMPLANT
ELECT REM PT RETURN 9FT ADLT (ELECTROSURGICAL) ×3
ELECTRODE REM PT RTRN 9FT ADLT (ELECTROSURGICAL) ×1 IMPLANT
GLOVE BIO SURGEON STRL SZ 6.5 (GLOVE) ×2 IMPLANT
GLOVE BIO SURGEONS STRL SZ 6.5 (GLOVE) ×1
GOWN STRL REUS W/ TWL LRG LVL3 (GOWN DISPOSABLE) ×4 IMPLANT
GOWN STRL REUS W/TWL LRG LVL3 (GOWN DISPOSABLE) ×8
GRASPER SUT TROCAR 14GX15 (MISCELLANEOUS) IMPLANT
HEMOSTAT SURGICEL 2X3 (HEMOSTASIS) IMPLANT
IRRIGATION STRYKERFLOW (MISCELLANEOUS) ×1 IMPLANT
IRRIGATOR STRYKERFLOW (MISCELLANEOUS) ×3
IV NS 1000ML (IV SOLUTION) ×2
IV NS 1000ML BAXH (IV SOLUTION) ×1 IMPLANT
KIT TURNOVER KIT A (KITS) ×3 IMPLANT
LABEL OR SOLS (LABEL) ×3 IMPLANT
NDL HYPO 25X1 1.5 SAFETY (NEEDLE) ×1 IMPLANT
NDL INSUFFLATION 14GA 120MM (NEEDLE) ×1 IMPLANT
NEEDLE HYPO 25X1 1.5 SAFETY (NEEDLE) ×3 IMPLANT
NEEDLE INSUFFLATION 14GA 120MM (NEEDLE) ×3 IMPLANT
NS IRRIG 500ML POUR BTL (IV SOLUTION) ×3 IMPLANT
PACK LAP CHOLECYSTECTOMY (MISCELLANEOUS) ×3 IMPLANT
PENCIL ELECTRO HAND CTR (MISCELLANEOUS) IMPLANT
POUCH SPECIMEN RETRIEVAL 10MM (ENDOMECHANICALS) ×3 IMPLANT
SCISSORS METZENBAUM CVD 33 (INSTRUMENTS) ×3 IMPLANT
SLEEVE ENDOPATH XCEL 5M (ENDOMECHANICALS) ×6 IMPLANT
SUT MNCRL AB 4-0 PS2 18 (SUTURE) ×3 IMPLANT
SUT VIC AB 0 CT1 36 (SUTURE) ×2 IMPLANT
SUT VICRYL 0 AB UR-6 (SUTURE) ×3 IMPLANT
TROCAR XCEL NON-BLD 11X100MML (ENDOMECHANICALS) ×3 IMPLANT
TROCAR XCEL NON-BLD 5MMX100MML (ENDOMECHANICALS) ×3 IMPLANT
TUBING INSUFFLATION (TUBING) ×3 IMPLANT

## 2018-03-23 NOTE — Op Note (Signed)
Preoperative diagnosis:  Acute cholecystitis  Postoperative diagnosis: Acute cholecystitis.  Procedure: Laparoscopic Cholecystectomy.   Anesthesia: GETA   Surgeon: Dr. Windell Moment  Wound Classification: Clean Contaminated  Indications: Patient is a 72 y.o. male developed right upper quadrant pain, nausea, vomiting, fever and on workup was found to have cholelithiasis with a normal common duct. Clinical history was consistent with cholecystitis. Laparoscopic cholecystectomy was elected.  Findings: Acutely inflamed, tense gallbladder Critical view of safety achieved Cystic duct and artery identified, ligated and divided Adequate hemostasis Large right flank hernia, non obstructive  Description of procedure: The patient was placed on the operating table in the supine position. General anesthesia was induced. A time-out was completed verifying correct patient, procedure, site, positioning, and implant(s) and/or special equipment prior to beginning this procedure. An orogastric tube was placed. The abdomen was prepped and draped in the usual sterile fashion.  An incision was made in a natural skin line below the umbilicus.  The fascia was elevated and the Veress needle inserted. Proper position was confirmed by aspiration and saline meniscus test.  The abdomen was insufflated with carbon dioxide to a pressure of 15 mmHg. The patient tolerated insufflation well. A 11-mm trocar was then inserted.  The laparoscope was inserted and the abdomen inspected. No injuries from initial trocar placement were noted. Additional trocars were then inserted in the following locations: a 5-mm trocar in the right epigastrium and two 5-mm trocars along the right costal margin. The abdomen was inspected and no abnormalities were found. The table was placed in the reverse Trendelenburg position with the right side up.  Filmy adhesions between the gallbladder and omentum, duodenum and transverse colon were lysed  sharply. The dome of the gallbladder was unable to be grasped and the gallbladder needed to be aspirated to be able to be grasped with an atraumatic grasper that was passed through the lateral port and retracted over the dome of the liver. The infundibulum was also grasped with an atraumatic grasper through the midclavicular port and retracted toward the right lower quadrant. This maneuver exposed Calot's triangle. The peritoneum overlying the gallbladder infundibulum was then incised and the cystic duct and cystic artery identified and circumferentially dissected. A time out was done to review the critical view of safety structures. The cystic duct and cystic artery were then doubly clipped and divided close to the gallbladder.  The gallbladder was then dissected from its peritoneal attachments by electrocautery. Hemostasis was checked and the gallbladder and contained stones were removed using an endoscopic retrieval bag placed through the umbilical port. The gallbladder was passed off the table as a specimen. The gallbladder fossa was copiously irrigated with saline and hemostasis was obtained. There was no evidence of bleeding from the gallbladder fossa or cystic artery or leakage of the bile from the cystic duct stump. Secondary trocars were removed under direct vision. No bleeding was noted. The laparoscope was withdrawn and the umbilical trocar removed. The abdomen was allowed to collapse. The fascia of the 47mm trocar sites was closed with figure-of-eight 0 vicryl sutures. The skin was closed with subcuticular sutures of 4-0 monocryl and topical skin adhesive. The orogastric tube was removed.  The patient tolerated the procedure well and was taken to the postanesthesia care unit in stable condition.   Specimen: Gallbladder  Complications: None  EBL: 81mL

## 2018-03-23 NOTE — Progress Notes (Signed)
Was unable to see this patient in follow up as was in surgery. Will follow up tomorrow. In the interim, will c/s DM nurse.

## 2018-03-23 NOTE — Anesthesia Procedure Notes (Signed)
Procedure Name: Intubation Date/Time: 03/23/2018 3:39 PM Performed by: Aline Brochure, CRNA Pre-anesthesia Checklist: Patient identified, Patient being monitored, Timeout performed, Emergency Drugs available and Suction available Patient Re-evaluated:Patient Re-evaluated prior to induction Oxygen Delivery Method: Circle system utilized Preoxygenation: Pre-oxygenation with 100% oxygen Induction Type: IV induction Ventilation: Mask ventilation without difficulty and Oral airway inserted - appropriate to patient size Laryngoscope Size: Mac and 3 Grade View: Grade I Tube type: Oral Tube size: 7.5 mm Number of attempts: 1 Placement Confirmation: ETT inserted through vocal cords under direct vision,  positive ETCO2 and breath sounds checked- equal and bilateral Secured at: 21 cm Tube secured with: Tape Dental Injury: Teeth and Oropharynx as per pre-operative assessment

## 2018-03-23 NOTE — Anesthesia Post-op Follow-up Note (Signed)
Anesthesia QCDR form completed.        

## 2018-03-23 NOTE — Anesthesia Postprocedure Evaluation (Signed)
Anesthesia Post Note  Patient: [name]  Procedure(s) Performed: LAPAROSCOPIC CHOLECYSTECTOMY (N/A Abdomen)  Patient location during evaluation: PACU Anesthesia Type: General Level of consciousness: awake and alert Pain management: pain level controlled Vital Signs Assessment: post-procedure vital signs reviewed and stable Respiratory status: spontaneous breathing and respiratory function stable Cardiovascular status: stable Anesthetic complications: no     Last Vitals:  Vitals:   03/23/18 1515 03/23/18 1723  BP: (!) 154/82 111/62  Pulse: 81 63  Resp: 20 18  Temp: (!) 36.3 C (!) 36.4 C  SpO2: 93% 95%    Last Pain:  Vitals:   03/23/18 1723  TempSrc:   PainSc: Asleep                 KEPHART,WILLIAM K

## 2018-03-23 NOTE — Anesthesia Preprocedure Evaluation (Addendum)
Anesthesia Evaluation  Patient identified by MRN, date of birth, ID band Patient awake    Reviewed: Allergy & Precautions, NPO status , Patient's Chart, lab work & pertinent test results  History of Anesthesia Complications Negative for: history of anesthetic complications  Airway Mallampati: II       Dental  (+) Chipped, Missing   Pulmonary neg sleep apnea, neg COPD, former smoker,           Cardiovascular hypertension, Pt. on medications (-) Past MI and (-) CHF (-) dysrhythmias (-) Valvular Problems/Murmurs     Neuro/Psych neg Seizures  Blind in R eye    GI/Hepatic GERD  Medicated and Controlled,  Endo/Other  diabetes, Type 2, Oral Hypoglycemic Agents  Renal/GU Renal disease (Renal CA, S/P nephrectomy)     Musculoskeletal   Abdominal   Peds  Hematology   Anesthesia Other Findings   Reproductive/Obstetrics                            Anesthesia Physical Anesthesia Plan  ASA: III  Anesthesia Plan: General   Post-op Pain Management:    Induction:   PONV Risk Score and Plan: 2 and Dexamethasone and Ondansetron  Airway Management Planned: Oral ETT  Additional Equipment:   Intra-op Plan:   Post-operative Plan:   Informed Consent: I have reviewed the patients History and Physical, chart, labs and discussed the procedure including the risks, benefits and alternatives for the proposed anesthesia with the patient or authorized representative who has indicated his/her understanding and acceptance.     Plan Discussed with:   Anesthesia Plan Comments:         Anesthesia Quick Evaluation

## 2018-03-23 NOTE — Transfer of Care (Signed)
Immediate Anesthesia Transfer of Care Note  Patient: [name]  Procedure(s) Performed: LAPAROSCOPIC CHOLECYSTECTOMY (N/A Abdomen)  Patient Location: PACU  Anesthesia Type:General  Level of Consciousness: sedated  Airway & Oxygen Therapy: Patient Spontanous Breathing and Patient connected to face mask oxygen  Post-op Assessment: Report given to RN and Post -op Vital signs reviewed and stable  Post vital signs: Reviewed and stable  Last Vitals:  Vitals Value Taken Time  BP 111/62 03/23/2018  5:23 PM  Temp 36.4 C 03/23/2018  5:23 PM  Pulse 65 03/23/2018  5:25 PM  Resp 19 03/23/2018  5:25 PM  SpO2 95 % 03/23/2018  5:25 PM  Vitals shown include unvalidated device data.  Last Pain:  Vitals:   03/23/18 1723  TempSrc:   PainSc: Asleep      Patients Stated Pain Goal: 1 (80/88/11 0315)  Complications: No apparent anesthesia complications

## 2018-03-23 NOTE — Interval H&P Note (Signed)
History and Physical Interval Note:  03/23/2018 3:28 PM  [name]  has presented today for surgery, with the diagnosis of N/A  The various methods of treatment have been discussed with the patient and family. After consideration of risks, benefits and other options for treatment, the patient has consented to  Procedure(s): LAPAROSCOPIC CHOLECYSTECTOMY (N/A) as a surgical intervention .  The patient's history has been reviewed, patient examined, no change in status, stable for surgery.  I have reviewed the patient's chart and labs.  Questions were answered to the patient's satisfaction.     Herbert Pun

## 2018-03-23 NOTE — H&P (View-Only) (Signed)
Patient ID: [name], male   DOB: Dec 06, 1946, 72 y.o.   MRN: [id number]     [place] [facility] Day(s): 1.   Post op day(s):  Marland Kitchen   Interval History: Patient seen and examined, no acute events or new complaints overnight. Patient reports persistent abdominal pain, denies fever or chills. .  Vital signs in last 24 hours: [min-max] current  Temp:  [98.1 F (36.7 C)-99.2 F (37.3 C)] 98.9 F (37.2 C) (03/27 0419) Pulse Rate:  [74-84] 84 (03/27 0419) Resp:  [18-20] 20 (03/27 0419) BP: (162-177)/(70-83) 162/80 (03/27 0419) SpO2:  [92 %-98 %] 92 % (03/27 0419) Weight:  [109.6 kg (241 lb 10 oz)] 109.6 kg (241 lb 10 oz) (03/26 1742)     Height: 6' (182.9 cm) Weight: 109.6 kg (241 lb 10 oz) BMI (Calculated): 32.76    Physical Exam:  Constitutional: alert, cooperative and no distress  HENT: normocephalic without obvious abnormality  Eyes: PERRL, EOM's grossly intact and symmetric  Respiratory: breathing non-labored at rest  Cardiovascular: regular rate and sinus rhythm  Gastrointestinal: soft, tender on right upper quadrant, and non-distended  Labs:  No flowsheet data found. CMP Latest Ref Rng & Units 03/23/2018 03/22/2018  Glucose 65 - 99 mg/dL 134(H) 117(H)  BUN 6 - 20 mg/dL 7 7  Creatinine 0.61 - 1.24 mg/dL 0.93 0.85  Sodium 135 - 145 mmol/L 138 137  Potassium 3.5 - 5.1 mmol/L 3.3(L) 3.5  Chloride 101 - 111 mmol/L 102 102  CO2 22 - 32 mmol/L 26 26  Calcium 8.9 - 10.3 mg/dL 8.5(L) 8.7(L)  Total Protein 6.5 - 8.1 g/dL 7.3 -  Total Bilirubin 0.3 - 1.2 mg/dL 1.3(H) -  Alkaline Phos 38 - 126 U/L 238(H) -  AST 15 - 41 U/L 52(H) -  ALT 17 - 63 U/L 92(H) -   Imaging studies: No new pertinent imaging studies  Assessment/Plan:  72 y.o. male with cholelithiasis with increasing trend again of bilirubin. Persistent abdominal pain. No fever. Alk phos/AST/ALT decreasing but bilirubin increased. Will order MRCP for evaluation of biliary duct and rule out choledocholithiasis  before cholecystectomy. If negative will take patient to OR for cholecystectomy. If positive will consult GI for evaluation and recommendations. Currently patient NPO for study and possible surgery. Receiving 0.9NSS for hydration since patient is dehydrated.    All of the above findings and recommendations were discussed with the patient, patient's wife, and the medical team, and all of patient's and family's questions were answered to their expressed satisfaction.  Arnold Long, MD

## 2018-03-23 NOTE — Progress Notes (Signed)
Patient ID: [name], male   DOB: Dec 06, 1946, 72 y.o.   MRN: [id number]     [facility] Day(s): 1.   Post op day(s):  Marland Kitchen   Interval History: Patient seen and examined, no acute events or new complaints overnight. Patient reports persistent abdominal pain, denies fever or chills. .  Vital signs in last 24 hours: [min-max] current  Temp:  [98.1 F (36.7 C)-99.2 F (37.3 C)] 98.9 F (37.2 C) (03/27 0419) Pulse Rate:  [74-84] 84 (03/27 0419) Resp:  [18-20] 20 (03/27 0419) BP: (162-177)/(70-83) 162/80 (03/27 0419) SpO2:  [92 %-98 %] 92 % (03/27 0419) Weight:  [109.6 kg (241 lb 10 oz)] 109.6 kg (241 lb 10 oz) (03/26 1742)     Height: 6' (182.9 cm) Weight: 109.6 kg (241 lb 10 oz) BMI (Calculated): 32.76    Physical Exam:  Constitutional: alert, cooperative and no distress  HENT: normocephalic without obvious abnormality  Eyes: PERRL, EOM's grossly intact and symmetric  Respiratory: breathing non-labored at rest  Cardiovascular: regular rate and sinus rhythm  Gastrointestinal: soft, tender on right upper quadrant, and non-distended  Labs:  No flowsheet data found. CMP Latest Ref Rng & Units 03/23/2018 03/22/2018  Glucose 65 - 99 mg/dL 134(H) 117(H)  BUN 6 - 20 mg/dL 7 7  Creatinine 0.61 - 1.24 mg/dL 0.93 0.85  Sodium 135 - 145 mmol/L 138 137  Potassium 3.5 - 5.1 mmol/L 3.3(L) 3.5  Chloride 101 - 111 mmol/L 102 102  CO2 22 - 32 mmol/L 26 26  Calcium 8.9 - 10.3 mg/dL 8.5(L) 8.7(L)  Total Protein 6.5 - 8.1 g/dL 7.3 -  Total Bilirubin 0.3 - 1.2 mg/dL 1.3(H) -  Alkaline Phos 38 - 126 U/L 238(H) -  AST 15 - 41 U/L 52(H) -  ALT 17 - 63 U/L 92(H) -   Imaging studies: No new pertinent imaging studies  Assessment/Plan:  72 y.o. male with cholelithiasis with increasing trend again of bilirubin. Persistent abdominal pain. No fever. Alk phos/AST/ALT decreasing but bilirubin increased. Will order MRCP for evaluation of biliary duct and rule out choledocholithiasis  before cholecystectomy. If negative will take patient to OR for cholecystectomy. If positive will consult GI for evaluation and recommendations. Currently patient NPO for study and possible surgery. Receiving 0.9NSS for hydration since patient is dehydrated.    All of the above findings and recommendations were discussed with the patient, patient's wife, and the medical team, and all of patient's and family's questions were answered to their expressed satisfaction.  Martin Long, MD

## 2018-03-24 ENCOUNTER — Encounter: Payer: Self-pay | Admitting: General Surgery

## 2018-03-24 LAB — GLUCOSE, CAPILLARY
GLUCOSE-CAPILLARY: 175 mg/dL — AB (ref 65–99)
GLUCOSE-CAPILLARY: 216 mg/dL — AB (ref 65–99)
Glucose-Capillary: 155 mg/dL — ABNORMAL HIGH (ref 65–99)
Glucose-Capillary: 175 mg/dL — ABNORMAL HIGH (ref 65–99)

## 2018-03-24 LAB — COMPREHENSIVE METABOLIC PANEL
ALK PHOS: 214 U/L — AB (ref 38–126)
ALT: 74 U/L — ABNORMAL HIGH (ref 17–63)
ANION GAP: 10 (ref 5–15)
AST: 57 U/L — ABNORMAL HIGH (ref 15–41)
Albumin: 2.8 g/dL — ABNORMAL LOW (ref 3.5–5.0)
BILIRUBIN TOTAL: 1.7 mg/dL — AB (ref 0.3–1.2)
BUN: 8 mg/dL (ref 6–20)
CALCIUM: 8.4 mg/dL — AB (ref 8.9–10.3)
CO2: 23 mmol/L (ref 22–32)
Chloride: 103 mmol/L (ref 101–111)
Creatinine, Ser: 0.83 mg/dL (ref 0.61–1.24)
GFR calc non Af Amer: >60 mL/min (ref >60)
Glucose, Bld: 139 mg/dL — ABNORMAL HIGH (ref 65–99)
Potassium: 3.6 mmol/L (ref 3.5–5.1)
Sodium: 136 mmol/L (ref 135–145)
TOTAL PROTEIN: 7.6 g/dL (ref 6.5–8.1)

## 2018-03-24 MED ORDER — NIFEDIPINE ER 30 MG PO TB24
30.0000 mg | ORAL_TABLET | Freq: Every day | ORAL | Status: DC
  Administered 2018-03-25: 30 mg via ORAL
  Filled 2018-03-24 (×2): qty 1

## 2018-03-24 MED ORDER — TRAMADOL HCL 50 MG PO TABS
50.0000 mg | ORAL_TABLET | Freq: Four times a day (QID) | ORAL | Status: DC | PRN
  Administered 2018-03-24 – 2018-03-25 (×3): 50 mg via ORAL
  Filled 2018-03-24 (×3): qty 1

## 2018-03-24 MED ORDER — LISINOPRIL 10 MG PO TABS
10.0000 mg | ORAL_TABLET | Freq: Every day | ORAL | Status: DC
  Administered 2018-03-24 – 2018-03-25 (×2): 10 mg via ORAL
  Filled 2018-03-24 (×2): qty 1

## 2018-03-24 NOTE — Progress Notes (Signed)
Inpatient Diabetes Program Recommendations  AACE/ADA: New Consensus Statement on Inpatient Glycemic Control (2015)  Target Ranges:  Prepandial:   less than 140 mg/dL      Peak postprandial:   less than 180 mg/dL (1-2 hours)      Critically ill patients:  140 - 180 mg/dL   Results for ADRIEL, KESSEN (MRN [id number]) as of 03/24/2018 13:20  Ref. Range 03/23/2018 07:37 03/23/2018 12:28 03/23/2018 15:17 03/23/2018 17:25 03/23/2018 21:33  Glucose-Capillary Latest Ref Range: 65 - 99 mg/dL 137 (H) 156 (H) 152 (H) 180 (H) 163 (H)   Results for TRENDON, ZARING (MRN [id number]) as of 03/24/2018 13:20  Ref. Range 03/24/2018 07:40 03/24/2018 11:55  Glucose-Capillary Latest Ref Range: 65 - 99 mg/dL 155 (H) 175 (H)   Results for ALTIN, SEASE (MRN [id number]) as of 03/24/2018 13:20  Ref. Range 03/22/2018 19:02  Hemoglobin A1C Latest Ref Range: 4.8 - 5.6 % 7.5 (H)    Admit: Acute cholecystitis  History: DM  Home DM Meds: Novolin 70/30 Insulin- 30 units BID        Amaryl 4 mg daily  Current Orders: Lantus 10 units BID       Novolog Sensitive Correction Scale/ SSI (0-9 units) TID AC + HS      CBGs stable on current Insulin regimen.  Hemoglobin A1c shows decent control at home= 7.5%.  No insulin recommendations at this time given stability of CBGs today.     --Will follow patient during hospitalization--  Wyn Quaker RN, MSN, CDE Diabetes Coordinator Inpatient Glycemic Control Team Team Pager: [phone] (8a-5p)

## 2018-03-24 NOTE — Progress Notes (Signed)
[facility] NAME: [name]    MR#:  [id number]  DATE OF BIRTH:  Jul 21, 1946  SUBJECTIVE:   She is admitted to the hospital due to ongoing abdominal pain noted to have acute cholecystitis.  Patient status post scopic cholecystectomy postop day #1 today.  Blood sugars are stable.  Patient still complains of some mild abdominal pain but otherwise much better.  REVIEW OF SYSTEMS:    Review of Systems  Constitutional: Negative for chills and fever.  HENT: Negative for congestion and tinnitus.   Eyes: Negative for blurred vision and double vision.  Respiratory: Negative for cough, shortness of breath and wheezing.   Cardiovascular: Negative for chest pain, orthopnea and PND.  Gastrointestinal: Positive for abdominal pain. Negative for diarrhea, nausea and vomiting.  Genitourinary: Negative for dysuria and hematuria.  Neurological: Negative for dizziness, sensory change and focal weakness.  All other systems reviewed and are negative.   Nutrition: Full Liquid Tolerating Diet: Yes Tolerating PT: Ambulatory  DRUG ALLERGIES:  No Known Allergies  VITALS:  Blood pressure (!) 159/82, pulse 97, temperature 98.2 F (36.8 C), temperature source Oral, resp. rate 20, height 6' (1.829 m), weight 109.6 kg (241 lb 10 oz), SpO2 90 %.  PHYSICAL EXAMINATION:   Physical Exam  GENERAL:  72 y.o.-year-old obese patient lying in bed in no acute distress.  EYES: Pupils equal, round, reactive to light and accommodation. No scleral icterus. Extraocular muscles intact.  HEENT: Head atraumatic, normocephalic. Oropharynx and nasopharynx clear.  NECK:  Supple, no jugular venous distention. No thyroid enlargement, no tenderness.  LUNGS: Normal breath sounds bilaterally, no wheezing, rales, rhonchi. No use of accessory muscles of respiration.  CARDIOVASCULAR: S1, S2 normal. No murmurs, rubs, or gallops.  ABDOMEN: Soft, Tender near surgical sites,  nondistended. Bowel sounds present. No organomegaly or mass.  EXTREMITIES: No cyanosis, clubbing or edema b/l.    NEUROLOGIC: Cranial nerves II through XII are intact. No focal Motor or sensory deficits b/l.   PSYCHIATRIC: The patient is alert and oriented x 3.  SKIN: No obvious rash, lesion, or ulcer.    LABORATORY PANEL:   CBC No results for input(s): WBC, HGB, HCT, PLT in the last 168 hours. ------------------------------------------------------------------------------------------------------------------  Chemistries  Recent Labs  Lab 03/24/18 0519  NA 136  K 3.6  CL 103  CO2 23  GLUCOSE 139*  BUN 8  CREATININE 0.83  CALCIUM 8.4*  AST 57*  ALT 74*  ALKPHOS 214*  BILITOT 1.7*   ------------------------------------------------------------------------------------------------------------------  Cardiac Enzymes No results for input(s): TROPONINI in the last 168 hours. ------------------------------------------------------------------------------------------------------------------  RADIOLOGY:  Mr 3d Recon At Scanner  Result Date: 03/23/2018 CLINICAL DATA:  Abdominal pain with abnormal liver function studies. Cholelithiasis and increased hepatic echogenicity on ultrasound. EXAM: MRI ABDOMEN WITHOUT CONTRAST  (INCLUDING MRCP) TECHNIQUE: Multiplanar multisequence MR imaging of the abdomen was performed. Heavily T2-weighted images of the biliary and pancreatic ducts were obtained, and three-dimensional MRCP images were rendered by post processing. COMPARISON:  Ultrasound 03/17/2018. FINDINGS: Despite efforts by the technologist and patient, mild motion artifact is present on today's exam and could not be eliminated. This reduces exam sensitivity and specificity. Motion is greatest on the post-contrast images. Lower chest: Trace bilateral pleural effusions. The visualized lower chest otherwise appears unremarkable. Hepatobiliary: The liver demonstrates no significant signal dropout on  gradient echo opposed phase images to suggest significant steatosis. No focal hepatic lesions or abnormal enhancement seen following contrast. The gallbladder is well distended.  There are several small gallstones. No evidence of gallbladder wall thickening or pericholecystic inflammation. The common bile duct is normal in caliber. No evidence of choledocholithiasis. Pancreas: Unremarkable. No pancreatic ductal dilatation or surrounding inflammatory changes. Normal pancreatic ductal anatomy. Spleen: Normal in size without focal abnormality. Adrenals/Urinary Tract: Both adrenal glands appear normal. Previous right nephrectomy. The left kidney appears normal. No evidence of mass in the nephrectomy bed. Stomach/Bowel: No evidence of bowel wall thickening, distention or surrounding inflammatory change.Mild diverticulosis of the descending colon. Vascular/Lymphatic: There are no enlarged abdominal lymph nodes. Mild aortic atherosclerosis. The IVC and left renal vein appear normal. No acute vascular findings. Other: There is a broad-based hernia involving the right lateral abdominal wall, incompletely visualized. This may be incisional. Portions of the liver, right colon and distal small bowel protrude into this hernia. No ascites. Musculoskeletal: No acute or significant osseous findings. IMPRESSION: 1. Cholelithiasis without evidence of cholecystitis or biliary dilatation. 2. The liver appears normal without steatosis or focal abnormality. 3. No evidence of metastatic renal cell carcinoma status post right nephrectomy. The left kidney appears normal. 4. Probable incisional hernia involving the right lateral abdominal wall, incompletely visualized. Electronically Signed   By: Richardean Sale M.D.   On: 03/23/2018 12:42   Mr Abdomen With Mrcp W Contrast  Result Date: 03/23/2018 CLINICAL DATA:  Abdominal pain with abnormal liver function studies. Cholelithiasis and increased hepatic echogenicity on ultrasound. EXAM: MRI  ABDOMEN WITHOUT CONTRAST  (INCLUDING MRCP) TECHNIQUE: Multiplanar multisequence MR imaging of the abdomen was performed. Heavily T2-weighted images of the biliary and pancreatic ducts were obtained, and three-dimensional MRCP images were rendered by post processing. COMPARISON:  Ultrasound 03/17/2018. FINDINGS: Despite efforts by the technologist and patient, mild motion artifact is present on today's exam and could not be eliminated. This reduces exam sensitivity and specificity. Motion is greatest on the post-contrast images. Lower chest: Trace bilateral pleural effusions. The visualized lower chest otherwise appears unremarkable. Hepatobiliary: The liver demonstrates no significant signal dropout on gradient echo opposed phase images to suggest significant steatosis. No focal hepatic lesions or abnormal enhancement seen following contrast. The gallbladder is well distended. There are several small gallstones. No evidence of gallbladder wall thickening or pericholecystic inflammation. The common bile duct is normal in caliber. No evidence of choledocholithiasis. Pancreas: Unremarkable. No pancreatic ductal dilatation or surrounding inflammatory changes. Normal pancreatic ductal anatomy. Spleen: Normal in size without focal abnormality. Adrenals/Urinary Tract: Both adrenal glands appear normal. Previous right nephrectomy. The left kidney appears normal. No evidence of mass in the nephrectomy bed. Stomach/Bowel: No evidence of bowel wall thickening, distention or surrounding inflammatory change.Mild diverticulosis of the descending colon. Vascular/Lymphatic: There are no enlarged abdominal lymph nodes. Mild aortic atherosclerosis. The IVC and left renal vein appear normal. No acute vascular findings. Other: There is a broad-based hernia involving the right lateral abdominal wall, incompletely visualized. This may be incisional. Portions of the liver, right colon and distal small bowel protrude into this hernia. No  ascites. Musculoskeletal: No acute or significant osseous findings. IMPRESSION: 1. Cholelithiasis without evidence of cholecystitis or biliary dilatation. 2. The liver appears normal without steatosis or focal abnormality. 3. No evidence of metastatic renal cell carcinoma status post right nephrectomy. The left kidney appears normal. 4. Probable incisional hernia involving the right lateral abdominal wall, incompletely visualized. Electronically Signed   By: Richardean Sale M.D.   On: 03/23/2018 12:42     ASSESSMENT AND PLAN:   72 year old male with past medical history of diabetes, morbid obesity,  history of renal cell carcinoma, hypertension, tremor/possible Parkinson's who presents to the hospital due to abdominal pain and noted to have acute cholecystitis.  1.  Acute cholecystitis-status post laparoscopic cholecystectomy postop day #1 today. -Continue full liquid diet, advance as tolerated per surgery. - LFT's stable bilirubin still mildly elevated and we will continue to monitor.  MRCP done intraoperatively was negative for any acute stones.  2.  Diabetes type 2 without complication -Continue low-dose Lantus, sliding scale insulin.  Blood sugar stable  3. Hx of HTN - cont. Lisinopril, Procardia.   4. BPH - cont. Flomax.   5. GERD - cont. Protonix.    All the records are reviewed and case discussed with Care Management/Social Worker. Management plans discussed with the patient, family and they are in agreement.  CODE STATUS: Full code  DVT Prophylaxis: Lovenox  TOTAL TIME TAKING CARE OF THIS PATIENT: 25 minutes.   POSSIBLE D/C IN 1-2 DAYS, DEPENDING ON CLINICAL CONDITION.   Henreitta Leber M.D on 03/24/2018 at 1:39 PM  Between 7am to 6pm - Pager - [phone]  After 6pm go to [email or web address] - Proofreader  [facility]  Office  [phone]  CC: Primary care physician; Dion Body, MD

## 2018-03-24 NOTE — Progress Notes (Signed)
Patient ID: [name], male   DOB: 25-May-1946, 72 y.o.   MRN: [id number]     [facility] Hospital Day(s): 2.   Post op day(s): 1 Day Post-Op.   Interval History: Patient seen and examined, no acute events overnight. Patient reports significant pain on surgical area. Unable to be controlled with oral pain medications. Denies nausea or vomiting.   Vital signs in last 24 hours: [min-max] current  Temp:  [97.4 F (36.3 C)-98.8 F (37.1 C)] 98.2 F (36.8 C) (03/28 0506) Pulse Rate:  [63-97] 97 (03/28 0506) Resp:  [16-30] 20 (03/28 0506) BP: (111-178)/(62-83) 159/82 (03/28 0506) SpO2:  [90 %-97 %] 90 % (03/28 0506)     Height: 6' (182.9 cm) Weight: 109.6 kg (241 lb 10 oz) BMI (Calculated): 32.76   Physical Exam:  Constitutional: alert, cooperative and no distress  HENT: normocephalic without obvious abnormality  Eyes: PERRL, EOM's grossly intact and symmetric  Respiratory: breathing non-labored at rest  Cardiovascular: regular rate and sinus rhythm  Gastrointestinal: soft, tender palpation on right upper quadrant, and non-distended. Wounds are dry and clean.   Labs:  No flowsheet data found. CMP Latest Ref Rng & Units 03/24/2018 03/23/2018 03/22/2018  Glucose 65 - 99 mg/dL 139(H) 134(H) 117(H)  BUN 6 - 20 mg/dL 8 7 7   Creatinine 0.61 - 1.24 mg/dL 0.83 0.93 0.85  Sodium 135 - 145 mmol/L 136 138 137  Potassium 3.5 - 5.1 mmol/L 3.6 3.3(L) 3.5  Chloride 101 - 111 mmol/L 103 102 102  CO2 22 - 32 mmol/L 23 26 26   Calcium 8.9 - 10.3 mg/dL 8.4(L) 8.5(L) 8.7(L)  Total Protein 6.5 - 8.1 g/dL 7.6 7.3 -  Total Bilirubin 0.3 - 1.2 mg/dL 1.7(H) 1.3(H) -  Alkaline Phos 38 - 126 U/L 214(H) 238(H) -  AST 15 - 41 U/L 57(H) 52(H) -  ALT 17 - 63 U/L 74(H) 92(H) -   Imaging studies: MRCP yesterday shows a distended gallbladder with normal common bile duct. No sign of choledocholithiasis   Assessment/Plan: 72 y.o. male with  1. Acute cholecystitis status post laparoscopic  cholecystectomy POD#1. Recovering slowly.  2. Hyperbilirubinemia - may be expected post op or due to a retained stone. MRCP negative before surgery. Will continue IV hydration, and follow trend with new labs. Alk phosphatase and ALT in decreasing trend.  3. Post operative pain still significant. Need to be controlled with IV pain medication.   Patient encouraged to ambulate. Will keep Full liquid diet and assess for toleration. Will continue DVT prophylaxis, stress ulcer prophylaxis, blood pressure and sugar control.   Arnold Long, MD

## 2018-03-25 ENCOUNTER — Inpatient Hospital Stay
Admission: AD | Admit: 2018-03-25 | Discharge: 2018-03-25 | Disposition: A | Discharge status: Home / Self Care | Payer: PPO | Source: Ambulatory Visit | Attending: Specialist | Admitting: Specialist

## 2018-03-25 LAB — CBC WITH DIFFERENTIAL/PLATELET
Basophils Absolute: 0.1 10*3/uL (ref 0–0.1)
Basophils Relative: 1 %
EOS PCT: 3 %
Eosinophils Absolute: 0.3 10*3/uL (ref 0–0.7)
HCT: 41 % (ref 40.0–52.0)
HEMOGLOBIN: 13.3 g/dL (ref 13.0–18.0)
LYMPHS ABS: 1.1 10*3/uL (ref 1.0–3.6)
LYMPHS PCT: 11 %
MCH: 25.8 pg — AB (ref 26.0–34.0)
MCHC: 32.4 g/dL (ref 32.0–36.0)
MCV: 79.8 fL — ABNORMAL LOW (ref 80.0–100.0)
Monocytes Absolute: 0.8 10*3/uL (ref 0.2–1.0)
Monocytes Relative: 8 %
Neutro Abs: 8.1 10*3/uL — ABNORMAL HIGH (ref 1.4–6.5)
Neutrophils Relative %: 77 %
PLATELETS: 258 10*3/uL (ref 150–440)
RBC: 5.14 MIL/uL (ref 4.40–5.90)
RDW: 16.9 % — ABNORMAL HIGH (ref 11.5–14.5)
WBC: 10.5 10*3/uL (ref 3.8–10.6)

## 2018-03-25 LAB — COMPREHENSIVE METABOLIC PANEL
ALBUMIN: 2.6 g/dL — AB (ref 3.5–5.0)
ALT: 46 U/L (ref 17–63)
ALT: 47 U/L (ref 17–63)
ANION GAP: 12 (ref 5–15)
ANION GAP: 9 (ref 5–15)
AST: 31 U/L (ref 15–41)
AST: 34 U/L (ref 15–41)
Albumin: 2.5 g/dL — ABNORMAL LOW (ref 3.5–5.0)
Alkaline Phosphatase: 169 U/L — ABNORMAL HIGH (ref 38–126)
Alkaline Phosphatase: 163 U/L — ABNORMAL HIGH (ref 38–126)
BUN: 12 mg/dL (ref 6–20)
BUN: 15 mg/dL (ref 6–20)
CHLORIDE: 103 mmol/L (ref 101–111)
CHLORIDE: 103 mmol/L (ref 101–111)
CO2: 20 mmol/L — AB (ref 22–32)
CO2: 21 mmol/L — AB (ref 22–32)
Calcium: 8.3 mg/dL — ABNORMAL LOW (ref 8.9–10.3)
Calcium: 8.6 mg/dL — ABNORMAL LOW (ref 8.9–10.3)
Creatinine, Ser: 1 mg/dL (ref 0.61–1.24)
Creatinine, Ser: 1.34 mg/dL — ABNORMAL HIGH (ref 0.61–1.24)
GFR calc Af Amer: >60 mL/min (ref >60)
GFR calc non Af Amer: >60 mL/min (ref >60)
GFR calc non Af Amer: 52 mL/min — ABNORMAL LOW (ref >60)
GLUCOSE: 164 mg/dL — AB (ref 65–99)
Glucose, Bld: 303 mg/dL — ABNORMAL HIGH (ref 65–99)
POTASSIUM: 3.4 mmol/L — AB (ref 3.5–5.1)
POTASSIUM: 4.3 mmol/L (ref 3.5–5.1)
SODIUM: 133 mmol/L — AB (ref 135–145)
Sodium: 135 mmol/L (ref 135–145)
Total Bilirubin: 1.5 mg/dL — ABNORMAL HIGH (ref 0.3–1.2)
Total Bilirubin: 1 mg/dL (ref 0.3–1.2)
Total Protein: 7.3 g/dL (ref 6.5–8.1)
Total Protein: 7.8 g/dL (ref 6.5–8.1)

## 2018-03-25 LAB — SURGICAL PATHOLOGY

## 2018-03-25 LAB — MAGNESIUM: MAGNESIUM: 2 mg/dL (ref 1.7–2.4)

## 2018-03-25 LAB — GLUCOSE, CAPILLARY
GLUCOSE-CAPILLARY: 306 mg/dL — AB (ref 65–99)
Glucose-Capillary: 192 mg/dL — ABNORMAL HIGH (ref 65–99)

## 2018-03-25 LAB — ECHOCARDIOGRAM COMPLETE
HEIGHTINCHES: 72 in
WEIGHTICAEL: 3865.99 [oz_av]

## 2018-03-25 LAB — BILIRUBIN, DIRECT: Bilirubin, Direct: 0.3 mg/dL (ref 0.1–0.5)

## 2018-03-25 MED ORDER — INSULIN GLARGINE 100 UNIT/ML ~~LOC~~ SOLN
12.0000 [IU] | Freq: Two times a day (BID) | SUBCUTANEOUS | Status: DC
  Administered 2018-03-25: 12 [IU] via SUBCUTANEOUS
  Filled 2018-03-25 (×3): qty 0.12

## 2018-03-25 MED ORDER — PROPRANOLOL HCL 20 MG PO TABS
40.0000 mg | ORAL_TABLET | Freq: Three times a day (TID) | ORAL | Status: DC
  Administered 2018-03-25: 40 mg via ORAL
  Filled 2018-03-25 (×3): qty 2

## 2018-03-25 MED ORDER — DILTIAZEM HCL 25 MG/5ML IV SOLN
10.0000 mg | Freq: Once | INTRAVENOUS | Status: AC
  Administered 2018-03-25: 10 mg via INTRAVENOUS
  Filled 2018-03-25: qty 5

## 2018-03-25 MED ORDER — POTASSIUM CHLORIDE CRYS ER 20 MEQ PO TBCR
40.0000 meq | EXTENDED_RELEASE_TABLET | Freq: Once | ORAL | Status: AC
  Administered 2018-03-25: 40 meq via ORAL
  Filled 2018-03-25: qty 2

## 2018-03-25 MED ORDER — TRAMADOL HCL 50 MG PO TABS: 50.0000 mg | ORAL_TABLET | Freq: Four times a day (QID) | ORAL | 0 refills | Status: DC | PRN

## 2018-03-25 MED ORDER — PROPRANOLOL HCL 40 MG PO TABS: 40.0000 mg | ORAL_TABLET | Freq: Three times a day (TID) | ORAL | 0 refills | Status: DC

## 2018-03-25 NOTE — Progress Notes (Signed)
*  PRELIMINARY RESULTS* Echocardiogram 2D Echocardiogram has been performed.  Sherrie Sport 03/25/2018, 2:41 PM

## 2018-03-25 NOTE — Consult Note (Signed)
[facility] Cardiology  CARDIOLOGY CONSULT NOTE  Patient ID: [name] MRN: [id number] DOB/AGE: [birth year] 72 y.o.  Admit date: 03/22/2018 Referring Physician Verdell Carmine Primary Physician [facility] Primary Cardiologist  Reason for Consultation atrial fibrillation  HPI: 73 year old gentleman referred for evaluation of paroxysmal atrial fibrillation.  Patient was admitted with right upper quadrant pain, diagnosed with cholelithiasis, and underwent laparoscopic cholecystectomy.  Postop day 2, patient developed tachycardia, ECG revealed atrial fibrillation with a rapid ventricular rate, converted to sinus rhythm.  The patient previously was on propanolol, for tremors secondary to Parkinson's.  He denies chest pain or shortness of breath.  He denies palpitations or heart racing.  2D echocardiogram was performed today which revealed normal left ventricular function, with LVEF 55-65%.  Review of systems complete and found to be negative unless listed above     Past Medical History:  Diagnosis Date  . Back pain   . Bladder cancer ([place])   . Diabetes mellitus without complication ([place])   . Hypertension   . Obesity   . Renal cancer ([place])   . Renal cell cancer ([place])   . Throat cancer ([place])   . Tremor     Past Surgical History:  Procedure Laterality Date  . APPENDECTOMY    . BLADDER SURGERY    . CERVICAL SPINE SURGERY    . CHOLECYSTECTOMY N/A 03/23/2018   Procedure: LAPAROSCOPIC CHOLECYSTECTOMY;  Surgeon: Herbert Pun, MD;  Location: [name] ORS;  Service: General;  Laterality: N/A;  . COLONOSCOPY    . COLONOSCOPY WITH PROPOFOL N/A 11/10/2017   Procedure: COLONOSCOPY WITH PROPOFOL;  Surgeon: Toledo, Benay Pike, MD;  Location: [name] ENDOSCOPY;  Service: Gastroenterology;  Laterality: N/A;  . ESOPHAGOGASTRODUODENOSCOPY (EGD) WITH PROPOFOL N/A 11/10/2017   Procedure: ESOPHAGOGASTRODUODENOSCOPY (EGD) WITH PROPOFOL;  Surgeon: Toledo, Benay Pike, MD;  Location: [name] ENDOSCOPY;  Service:  Gastroenterology;  Laterality: N/A;  . HERNIA REPAIR    . left od surgery    . NEPHRECTOMY     right kidney nephrectomy  . rt elbow surgery    . THROAT SURGERY  1990   for cancer removal at [facility]    Medications Prior to Admission  Medication Sig Dispense Refill Last Dose  . acetaminophen (TYLENOL) 650 MG CR tablet Take 650 mg every 8 (eight) hours as needed by mouth for pain.   prn at prn  . amitriptyline (ELAVIL) 25 MG tablet Take 25 mg at bedtime by mouth.   03/21/2018 at 2000  . amoxicillin-clavulanate (AUGMENTIN) 500-125 MG tablet Take 1 tablet by mouth 2 (two) times daily.   03/22/2018 at 0800  . aspirin EC 81 MG tablet Take 81 mg daily by mouth.   03/22/2018 at 0800  . atorvastatin (LIPITOR) 40 MG tablet Take 40 mg daily by mouth.   03/22/2018 at 0800  . fluticasone (FLONASE) 50 MCG/ACT nasal spray Place 2 sprays into both nostrils daily.    03/22/2018 at 0800  . glimepiride (AMARYL) 4 MG tablet Take 4 mg daily with breakfast by mouth.   03/22/2018 at 0800  . hydrochlorothiazide (HYDRODIURIL) 25 MG tablet Take 25 mg daily by mouth.   03/22/2018 at 0800  . insulin NPH-regular Human (NOVOLIN 70/30) (70-30) 100 UNIT/ML injection Inject 30 Units into the skin 2 (two) times daily with a meal.    03/22/2018 at 0800  . lisinopril (PRINIVIL,ZESTRIL) 10 MG tablet Take 10 mg daily by mouth.   03/22/2018 at 0800  . NIFEdipine (PROCARDIA-XL/ADALAT-CC/NIFEDICAL-XL) 30 MG 24 hr tablet Take 30 mg daily by mouth.  03/22/2018 at 0800  . ondansetron (ZOFRAN-ODT) 4 MG disintegrating tablet Take 4 mg by mouth every 8 (eight) hours as needed for nausea or vomiting.   prn at prn  . propranolol (INDERAL) 40 MG tablet Take 40 mg by mouth 2 (two) times daily.    03/22/2018 at 0800  . ranitidine (ZANTAC) 150 MG tablet Take 150 mg by mouth 2 (two) times daily.   03/22/2018 at 0800  . tamsulosin (FLOMAX) 0.4 MG CAPS capsule Take 0.4 mg by mouth daily after breakfast.   03/22/2018 at 0800  . cimetidine (TAGAMET) 400  MG tablet Take 400 mg 2 (two) times daily by mouth.   Unknown at Unknown time   Social History   Socioeconomic History  . Marital status: Married    Spouse name: Not on file  . Number of children: Not on file  . Years of education: Not on file  . Highest education level: Not on file  Occupational History  . Not on file  Social Needs  . Financial resource strain: Not on file  . Food insecurity:    Worry: Not on file    Inability: Not on file  . Transportation needs:    Medical: Not on file    Non-medical: Not on file  Tobacco Use  . Smoking status: Former Research scientist (life sciences)  . Smokeless tobacco: Never Used  Substance and Sexual Activity  . Alcohol use: Yes    Frequency: Never    Comment: Occasional beer drinker  . Drug use: No  . Sexual activity: Yes  Lifestyle  . Physical activity:    Days per week: Not on file    Minutes per session: Not on file  . Stress: Not on file  Relationships  . Social connections:    Talks on phone: Not on file    Gets together: Not on file    Attends religious service: Not on file    Active member of club or organization: Not on file    Attends meetings of clubs or organizations: Not on file    Relationship status: Not on file  . Intimate partner violence:    Fear of current or ex partner: Not on file    Emotionally abused: Not on file    Physically abused: Not on file    Forced sexual activity: Not on file  Other Topics Concern  . Not on file  Social History Narrative   Lives at home with wife, active and independent at baseline    Family History  Problem Relation Age of Onset  . Diabetes Brother   . Hypertension Brother   . CAD Brother   . CAD Brother       Review of systems complete and found to be negative unless listed above      PHYSICAL EXAM  General: Well developed, well nourished, in no acute distress HEENT:  Normocephalic and atramatic Neck:  No JVD.  Lungs: Clear bilaterally to auscultation and percussion. Heart: HRRR .  Normal S1 and S2 without gallops or murmurs.  Abdomen: Bowel sounds are positive, abdomen soft and non-tender  Msk:  Back normal, normal gait. Normal strength and tone for age. Extremities: No clubbing, cyanosis or edema.   Neuro: Alert and oriented X 3. Psych:  Good affect, responds appropriately  Labs:   Lab Results  Component Value Date   WBC 10.5 03/25/2018   HGB 13.3 03/25/2018   HCT 41.0 03/25/2018   MCV 79.8 (L) 03/25/2018   PLT 258 03/25/2018  Recent Labs  Lab 03/25/18 1218  NA 133*  K 4.3  CL 103  CO2 21*  BUN 15  CREATININE 1.34*  CALCIUM 8.6*  PROT 7.8  BILITOT 1.0  ALKPHOS 163*  ALT 47  AST 34  GLUCOSE 303*   No results found for: CKTOTAL, CKMB, CKMBINDEX, TROPONINI No results found for: CHOL No results found for: HDL No results found for: LDLCALC No results found for: TRIG No results found for: CHOLHDL No results found for: LDLDIRECT    Radiology: Mr 3d Recon At Scanner  Result Date: 03/23/2018 CLINICAL DATA:  Abdominal pain with abnormal liver function studies. Cholelithiasis and increased hepatic echogenicity on ultrasound. EXAM: MRI ABDOMEN WITHOUT CONTRAST  (INCLUDING MRCP) TECHNIQUE: Multiplanar multisequence MR imaging of the abdomen was performed. Heavily T2-weighted images of the biliary and pancreatic ducts were obtained, and three-dimensional MRCP images were rendered by post processing. COMPARISON:  Ultrasound 03/17/2018. FINDINGS: Despite efforts by the technologist and patient, mild motion artifact is present on today's exam and could not be eliminated. This reduces exam sensitivity and specificity. Motion is greatest on the post-contrast images. Lower chest: Trace bilateral pleural effusions. The visualized lower chest otherwise appears unremarkable. Hepatobiliary: The liver demonstrates no significant signal dropout on gradient echo opposed phase images to suggest significant steatosis. No focal hepatic lesions or abnormal enhancement seen  following contrast. The gallbladder is well distended. There are several small gallstones. No evidence of gallbladder wall thickening or pericholecystic inflammation. The common bile duct is normal in caliber. No evidence of choledocholithiasis. Pancreas: Unremarkable. No pancreatic ductal dilatation or surrounding inflammatory changes. Normal pancreatic ductal anatomy. Spleen: Normal in size without focal abnormality. Adrenals/Urinary Tract: Both adrenal glands appear normal. Previous right nephrectomy. The left kidney appears normal. No evidence of mass in the nephrectomy bed. Stomach/Bowel: No evidence of bowel wall thickening, distention or surrounding inflammatory change.Mild diverticulosis of the descending colon. Vascular/Lymphatic: There are no enlarged abdominal lymph nodes. Mild aortic atherosclerosis. The IVC and left renal vein appear normal. No acute vascular findings. Other: There is a broad-based hernia involving the right lateral abdominal wall, incompletely visualized. This may be incisional. Portions of the liver, right colon and distal small bowel protrude into this hernia. No ascites. Musculoskeletal: No acute or significant osseous findings. IMPRESSION: 1. Cholelithiasis without evidence of cholecystitis or biliary dilatation. 2. The liver appears normal without steatosis or focal abnormality. 3. No evidence of metastatic renal cell carcinoma status post right nephrectomy. The left kidney appears normal. 4. Probable incisional hernia involving the right lateral abdominal wall, incompletely visualized. Electronically Signed   By: Richardean Sale M.D.   On: 03/23/2018 12:42   Mr Abdomen With Mrcp W Contrast  Result Date: 03/23/2018 CLINICAL DATA:  Abdominal pain with abnormal liver function studies. Cholelithiasis and increased hepatic echogenicity on ultrasound. EXAM: MRI ABDOMEN WITHOUT CONTRAST  (INCLUDING MRCP) TECHNIQUE: Multiplanar multisequence MR imaging of the abdomen was performed.  Heavily T2-weighted images of the biliary and pancreatic ducts were obtained, and three-dimensional MRCP images were rendered by post processing. COMPARISON:  Ultrasound 03/17/2018. FINDINGS: Despite efforts by the technologist and patient, mild motion artifact is present on today's exam and could not be eliminated. This reduces exam sensitivity and specificity. Motion is greatest on the post-contrast images. Lower chest: Trace bilateral pleural effusions. The visualized lower chest otherwise appears unremarkable. Hepatobiliary: The liver demonstrates no significant signal dropout on gradient echo opposed phase images to suggest significant steatosis. No focal hepatic lesions or abnormal enhancement seen following contrast. The  gallbladder is well distended. There are several small gallstones. No evidence of gallbladder wall thickening or pericholecystic inflammation. The common bile duct is normal in caliber. No evidence of choledocholithiasis. Pancreas: Unremarkable. No pancreatic ductal dilatation or surrounding inflammatory changes. Normal pancreatic ductal anatomy. Spleen: Normal in size without focal abnormality. Adrenals/Urinary Tract: Both adrenal glands appear normal. Previous right nephrectomy. The left kidney appears normal. No evidence of mass in the nephrectomy bed. Stomach/Bowel: No evidence of bowel wall thickening, distention or surrounding inflammatory change.Mild diverticulosis of the descending colon. Vascular/Lymphatic: There are no enlarged abdominal lymph nodes. Mild aortic atherosclerosis. The IVC and left renal vein appear normal. No acute vascular findings. Other: There is a broad-based hernia involving the right lateral abdominal wall, incompletely visualized. This may be incisional. Portions of the liver, right colon and distal small bowel protrude into this hernia. No ascites. Musculoskeletal: No acute or significant osseous findings. IMPRESSION: 1. Cholelithiasis without evidence of  cholecystitis or biliary dilatation. 2. The liver appears normal without steatosis or focal abnormality. 3. No evidence of metastatic renal cell carcinoma status post right nephrectomy. The left kidney appears normal. 4. Probable incisional hernia involving the right lateral abdominal wall, incompletely visualized. Electronically Signed   By: Richardean Sale M.D.   On: 03/23/2018 12:42   US Abdomen Limited Ruq  Result Date: 03/17/2018 CLINICAL DATA:  Right upper quadrant pain for 1 EXAM: ULTRASOUND ABDOMEN LIMITED RIGHT UPPER QUADRANT COMPARISON:  04/26/2009 FINDINGS: Gallbladder: Multiple gallstones are identified. No gallbladder wall thickening or pericholecystic fluid is noted. Gallbladder sludge is noted as well. Common bile duct: Diameter: 4 mm Liver: Mildly increased in echogenicity consistent with fatty infiltration. Portal vein is patent on color Doppler imaging with normal direction of blood flow towards the liver. IMPRESSION: Cholelithiasis and gallbladder sludge without complicating factors. Fatty liver. Electronically Signed   By: Inez Catalina M.D.   On: 03/17/2018 14:59    EKG: Normal sinus rhythm with right bundle branch block  ASSESSMENT AND PLAN:   1.  Paroxysmal atrial fibrillation, episode in the setting of postop for laparoscopic cholecystectomy for cholelithiasis, converted spontaneously back into sinus rhythm, back on propanolol.  2D echocardiogram reveals normal left ventricular function, without significant valvular abnormalities.  Recommendations  1.  Agree with current therapy 2.  Defer starting chronic anticoagulation since episode of atrial fibrillation likely occurred during a reversible event (cholelithiasis status post laparoscopic cholecystectomy ), exacerbated by stopping propranolol with rebound tachycardia 3.  Continue propranolol 4.  Follow-up as outpatient  Signed: Isaias Cowman MD,PhD, [facility] 03/25/2018, 4:23 PM

## 2018-03-25 NOTE — Discharge Summary (Signed)
Physician Discharge Summary  Patient ID: [name] MRN: [id number] DOB/AGE: 1946-07-22 72 y.o.  Admit date: 03/22/2018 Discharge date: 03/25/2018  Admission Diagnoses: Cholelithiasis with acute cholecystitis  Discharge Diagnoses:  Active Problems:   Cholelithiasis with acute cholecystitis (Resolved)   Dehydration (Resolved)   Hyperbilirubinemia (Resolved)   Atrial fibrillation vs supraventricular tachycardia (resolved)  Discharged Condition: fair  Hospital Course: Patient admitted from the clinic due to finding of right upper quadrant pain, hyperbilirubinemia, dehydration unable to tolerated diet and ultrasound with cholelithiasis. MRCP done and found with normal common bile duct. Patient taken to surgery and cholecystectomy was done. Patient had increased hyperbilirubinemia after surgery which was treated with IV hydration. Pain controlled with oral pain medication. On post op day #2 patient developed tachycardia. As per Hospitalist it was a supraventricular tachycardia which responded to medical management. Tachycardia may come since patient was not using the Propanolol. Now patient with a heart rate of 71, regular rhythm, no chest pain, no shortness of breath. Patient toleratind diet and ambulating. Bilirubin and liver enzymes normalized. Alk phosphates continue in decreasing trend. Will discharge patient home who will follow up with cardiologist as outpatient.   Consults: Hospitalist  Significant Diagnostic Studies: labs: bilirubin 1.0, Potassium 4.3, WBC 10.5. MRCP: normal biliary duct, no sign of choledocholithiasis.   Treatments: IV hydration, antibiotics: Zosyn, analgesia: Morphine and Tramadol, insulin: regular and Lantus and surgery: Laparoscopic cholecystectomy  Discharge Exam: Blood pressure 108/62, pulse 66, temperature 97.6 F (36.4 C), temperature source Oral, resp. rate 20, height 6' (1.829 m), weight 109.6 kg (241 lb 10 oz), SpO2 90 %. General appearance: alert and  cooperative Resp: clear to auscultation bilaterally Cardio: regular rate and rhythm GI: soft, non-tender; bowel sounds normal; no masses,  no organomegaly Pulses: 2+ and symmetric Incision/Wound:dry and clean.  Disposition: Discharge disposition: 01-Home or Self Care       Discharge Instructions    Diet - low sodium heart healthy   Complete by:  As directed    Increase activity slowly   Complete by:  As directed      Allergies as of 03/25/2018   No Known Allergies     Medication List    STOP taking these medications   amoxicillin-clavulanate 500-125 MG tablet Commonly known as:  AUGMENTIN     TAKE these medications   acetaminophen 650 MG CR tablet Commonly known as:  TYLENOL Take 650 mg every 8 (eight) hours as needed by mouth for pain.   amitriptyline 25 MG tablet Commonly known as:  ELAVIL Take 25 mg at bedtime by mouth.   aspirin EC 81 MG tablet Take 81 mg daily by mouth.   atorvastatin 40 MG tablet Commonly known as:  LIPITOR Take 40 mg daily by mouth.   cimetidine 400 MG tablet Commonly known as:  TAGAMET Take 400 mg 2 (two) times daily by mouth.   fluticasone 50 MCG/ACT nasal spray Commonly known as:  FLONASE Place 2 sprays into both nostrils daily.   glimepiride 4 MG tablet Commonly known as:  AMARYL Take 4 mg daily with breakfast by mouth.   hydrochlorothiazide 25 MG tablet Commonly known as:  HYDRODIURIL Take 25 mg daily by mouth.   insulin NPH-regular Human (70-30) 100 UNIT/ML injection Commonly known as:  NOVOLIN 70/30 Inject 30 Units into the skin 2 (two) times daily with a meal.   lisinopril 10 MG tablet Commonly known as:  PRINIVIL,ZESTRIL Take 10 mg daily by mouth.   NIFEdipine 30 MG 24 hr tablet Commonly  known as:  PROCARDIA-XL/ADALAT-CC/NIFEDICAL-XL Take 30 mg daily by mouth.   ondansetron 4 MG disintegrating tablet Commonly known as:  ZOFRAN-ODT Take 4 mg by mouth every 8 (eight) hours as needed for nausea or vomiting.    propranolol 40 MG tablet Commonly known as:  INDERAL Take 1 tablet (40 mg total) by mouth 3 (three) times daily. What changed:  when to take this   ranitidine 150 MG tablet Commonly known as:  ZANTAC Take 150 mg by mouth 2 (two) times daily.   tamsulosin 0.4 MG Caps capsule Commonly known as:  FLOMAX Take 0.4 mg by mouth daily after breakfast.   traMADol 50 MG tablet Commonly known as:  ULTRAM Take 1 tablet (50 mg total) by mouth every 6 (six) hours as needed for moderate pain.        Signed: Herbert Pun 03/25/2018, 3:36 PM

## 2018-03-25 NOTE — Progress Notes (Signed)
[name] at [place] NAME: Markeith Jue    MR#:  [id number]  DATE OF BIRTH:  [birth year]  SUBJECTIVE:   She developed some irregular heart rate/SVT this morning.  He was completely asymptomatic.  He denies any chest pain, shortness of breath or palpitations.  He denies abdominal pain nausea or vomiting.  REVIEW OF SYSTEMS:    Review of Systems  Constitutional: Negative for chills and fever.  HENT: Negative for congestion and tinnitus.   Eyes: Negative for blurred vision and double vision.  Respiratory: Negative for cough, shortness of breath and wheezing.   Cardiovascular: Negative for chest pain, orthopnea and PND.  Gastrointestinal: Positive for abdominal pain. Negative for diarrhea, nausea and vomiting.  Genitourinary: Negative for dysuria and hematuria.  Neurological: Negative for dizziness, sensory change and focal weakness.  All other systems reviewed and are negative.   Nutrition: Full Liquid Tolerating Diet: Yes Tolerating PT: Ambulatory  DRUG ALLERGIES:  No Known Allergies  VITALS:  Blood pressure 108/62, pulse 66, temperature 97.6 F (36.4 C), temperature source Oral, resp. rate 20, height 6' (1.829 m), weight 109.6 kg (241 lb 10 oz), SpO2 90 %.  PHYSICAL EXAMINATION:   Physical Exam  GENERAL:  72 y.o.-year-old obese patient lying in bed in no acute distress.  EYES: Pupils equal, round, reactive to light and accommodation. No scleral icterus. Extraocular muscles intact.  HEENT: Head atraumatic, normocephalic. Oropharynx and nasopharynx clear.  NECK:  Supple, no jugular venous distention. No thyroid enlargement, no tenderness.  LUNGS: Normal breath sounds bilaterally, no wheezing, rales, rhonchi. No use of accessory muscles of respiration.  CARDIOVASCULAR: S1, S2 RRR, Tachy. No murmurs, rubs, or gallops.  ABDOMEN: Soft, Tender near surgical sites, nondistended. Bowel sounds present. No organomegaly or mass.  EXTREMITIES:  No cyanosis, clubbing or edema b/l.    NEUROLOGIC: Cranial nerves II through XII are intact. No focal Motor or sensory deficits b/l.   PSYCHIATRIC: The patient is alert and oriented x 3.  SKIN: No obvious rash, lesion, or ulcer.    LABORATORY PANEL:   CBC Recent Labs  Lab 03/25/18 0559  WBC 10.5  HGB 13.3  HCT 41.0  PLT 258   ------------------------------------------------------------------------------------------------------------------  Chemistries  Recent Labs  Lab 03/25/18 0559 03/25/18 1218  NA 135 133*  K 3.4* 4.3  CL 103 103  CO2 20* 21*  GLUCOSE 164* 303*  BUN 12 15  CREATININE 1.00 1.34*  CALCIUM 8.3* 8.6*  MG 2.0  --   AST 31 34  ALT 46 47  ALKPHOS 169* 163*  BILITOT 1.5* 1.0   ------------------------------------------------------------------------------------------------------------------  Cardiac Enzymes No results for input(s): TROPONINI in the last 168 hours. ------------------------------------------------------------------------------------------------------------------  RADIOLOGY:  No results found.   ASSESSMENT AND PLAN:   72 year old male with past medical history of diabetes, morbid obesity, history of renal cell carcinoma, hypertension, tremor/possible Parkinson's who presents to the hospital due to abdominal pain and noted to have acute cholecystitis.  1.  Acute cholecystitis-status post laparoscopic cholecystectomy postop day #2 today. -Continue full liquid diet, advance as tolerated per surgery. - LFT's stable bilirubin stable.  MRCP done intraoperatively was negative for any acute stones.  2.  SVT/tachycardia-patient was noted to have SVT with heart rates in the 130s-150s this morning.  Patient was clinically asymptomatic.  EKG reading was consistent with atrial fibrillation.  Patient has no previous history of atrial fibrillation.  He was given 1 dose of IV Cardizem with some slowing of his heart rate.  He was restarted back on his  propranolol.  Echocardiogram obtained but results pending. -Patient has now converted to normal sinus rhythm.  Cardiology has been consulted and await further input.  3.  Diabetes type 2 without complication -Continue low-dose Lantus, sliding scale insulin.  Blood sugar stable  4. Hx of HTN - cont. Lisinopril, Procardia, Propranolol.   5. BPH - cont. Flomax.   6. GERD - cont. Protonix.   Discussed plan with pt's family and also with surgeon over the phone.   All the records are reviewed and case discussed with Care Management/Social Worker. Management plans discussed with the patient, family and they are in agreement.  CODE STATUS: Full code  DVT Prophylaxis: Lovenox  TOTAL TIME TAKING CARE OF THIS PATIENT: 30 minutes.   POSSIBLE D/C IN 1-2 DAYS, DEPENDING ON CLINICAL CONDITION.   Henreitta Leber M.D on 03/25/2018 at 3:36 PM  Between 7am to 6pm - Pager - [phone]  After 6pm go to [email or web address] - Proofreader  [facility]  Office  [phone]  CC: Primary care physician; Dion Body, MD

## 2018-03-25 NOTE — Progress Notes (Signed)
Patient ID: [name], male   DOB: [birth year], 72 y.o.   MRN: [id number]     [facility] Day(s): 3.   Post op day(s): 2 Days Post-Op.   Interval History: Patient seen and examined, no acute events or new complaints overnight. Patient refers feeling better but vital signs from yesterday show a low grade fever at 3PM. None since then. Not much appetite eating small amount. On physical exam heart rate and rhythm was found irregular.  Vital signs in last 24 hours: [min-max] current  Temp:  [97.5 F (36.4 C)-100.2 F (37.9 C)] 97.5 F (36.4 C) (03/29 0823) Pulse Rate:  [102-121] 113 (03/29 0823) Resp:  [16-20] 20 (03/29 0429) BP: (101-151)/(76-90) 151/85 (03/29 0823) SpO2:  [91 %-96 %] 95 % (03/29 0823)     Height: 6' (182.9 cm) Weight: 109.6 kg (241 lb 10 oz) BMI (Calculated): 32.76   Physical Exam:  Constitutional: alert, cooperative and no distress  Respiratory: breathing non-labored at rest  Cardiovascular: irregular rate and rythm Gastrointestinal: soft, mild-tender on right upper quadrant, and non-distended. Wounds dry and clean.   Labs:  CBC Latest Ref Rng & Units 03/25/2018  WBC 3.8 - 10.6 K/uL 10.5  Hemoglobin 13.0 - 18.0 g/dL 13.3  Hematocrit 40.0 - 52.0 % 41.0  Platelets 150 - 440 K/uL 258   CMP Latest Ref Rng & Units 03/25/2018 03/24/2018 03/23/2018  Glucose 65 - 99 mg/dL 164(H) 139(H) 134(H)  BUN 6 - 20 mg/dL _0 Creatinine 0.61 - 1.24 mg/dL 1.00 0.83 0.93  Sodium 135 - 145 mmol/L 135 136 138  Potassium 3.5 - 5.1 mmol/L 3.4(L) 3.6 3.3(L)  Chloride 101 - 111 mmol/L 103 103 102  CO2 22 - 32 mmol/L 20(L) 23 26  Calcium 8.9 - 10.3 mg/dL 8.3(L) 8.4(L) 8.5(L)  Total Protein 6.5 - 8.1 g/dL 7.3 7.6 7.3  Total Bilirubin 0.3 - 1.2 mg/dL 1.5(H) 1.7(H) 1.3(H)  Alkaline Phos 38 - 126 U/L 169(H) 214(H) 238(H)  AST 15 - 41 U/L 31 57(H) 52(H)  ALT 17 - 63 U/L 46 74(H) 92(H)   Imaging studies: No new pertinent imaging studies  Assessment/Plan:  72  y.o. male with acute cholecystitis 2 Days Post-Op s/p laparoscopic cholecytectomy.   -Hyperbilirubinemia - stable with slight decrease to 1.5. Will continue IV hydration, and follow trend with new labs and partition. Alk phosphatase and ALT/AST continue in decreasing trend.  -Post operative pain better.  -New Afib - EKG done and found with irregular rate and rhythm. Blood pressure stable. Will contact Hospitalist for recommendations   All of the above findings and recommendations were discussed with the patient, patient's family, and the medical team, and all of patient's and family's questions were answered to their expressed satisfaction.  Arnold Long, MD

## 2018-03-25 NOTE — Discharge Instructions (Signed)
  Diet: Resume home heart healthy regular diet.   Activity: No heavy lifting >20 pounds (children, pets, laundry, garbage) or strenuous activity until follow-up, but light activity and walking are encouraged. Do not drive or drink alcohol if taking narcotic pain medications.  Wound care: May shower with soapy water and pat dry (do not rub incisions), but no baths or submerging incision underwater until follow-up. (no swimming)   Medications: Resume all home medications. For mild to moderate pain: acetaminophen (Tylenol) or ibuprofen (if no kidney disease). Combining Tylenol with alcohol can substantially increase your risk of causing liver disease. Narcotic pain medications, if prescribed, can be used for severe pain, though may cause nausea, constipation, and drowsiness. Do not combine Tylenol and Percocet within a 6 hour period as Percocet contains Tylenol. If you do not need the narcotic pain medication, you do not need to fill the prescription.  Call office ([phone]) at any time if any questions, worsening pain, fevers/chills, bleeding, drainage from incision site, or other concerns.  

## 2018-03-25 NOTE — Progress Notes (Signed)
Inpatient Diabetes Program Recommendations  AACE/ADA: New Consensus Statement on Inpatient Glycemic Control (2015)  Target Ranges:  Prepandial:   less than 140 mg/dL      Peak postprandial:   less than 180 mg/dL (1-2 hours)      Critically ill patients:  140 - 180 mg/dL   Results for DESJUAN, STEARNS (MRN [id number]) as of 03/25/2018 08:22  Ref. Range 03/24/2018 07:40 03/24/2018 11:55 03/24/2018 16:42 03/24/2018 21:32  Glucose-Capillary Latest Ref Range: 65 - 99 mg/dL 155 (H) 175 (H) 216 (H) 175 (H)   Results for MACINTYRE, ALEXA (MRN [id number]) as of 03/25/2018 08:22  Ref. Range 03/25/2018 07:57  Glucose-Capillary Latest Ref Range: 65 - 99 mg/dL 192 (H)    Admit: Acute cholecystitis  History: DM  Home DM Meds: Novolin 70/30 Insulin- 30 units BID                              Amaryl 4 mg daily  Current Orders: Lantus 10 units BID                             Novolog Sensitive Correction Scale/ SSI (0-9 units) TID AC + HS     MD- Please consider the following in-hospital insulin adjustments:  1. Increase Lantus slightly to 12 units BID  2. Increase Novolog SSI to Moderate Correction Scale/ SSI (0-15 units) TID AC + HS    --Will follow patient during hospitalization--  Wyn Quaker RN, MSN, CDE Diabetes Coordinator Inpatient Glycemic Control Team Team Pager: [phone] (8a-5p)

## 2018-03-25 NOTE — Progress Notes (Signed)
Discharge instructions reviewed with patient, wife and daughter.  Understanding was verbalized and all questions were answered.  Patient discharged via wheelchair in stable condition escorted by nursing staff.

## 2018-03-25 NOTE — Progress Notes (Signed)
Patient ID: [name], male   DOB: 03-Aug-1946, 72 y.o.   MRN: [id number]  Re evaluated the patient and found on sinus rhythm and regular rate (67). Patient without chest pain or shortness of breath. Abdominal pain controlled with Tramadol. Tolerated diet.   Hospitalist evaluated patient and ordered Cardizem and an Echo.  Bilirubin normalizes to 1.0, Potassium increased to 4.3. No leukocytosis, no more fever. Will follow final recommendations before being able to discharge patient. From surgical standpoint patient may be discharged but new recommendations regarding new cardiac finding needs to be addressed.

## 2018-03-25 NOTE — Care Management Important Message (Signed)
Important Message  Patient Details  Name: [name] MRN: [id number] Date of Birth: 02/26/1946   Medicare Important Message Given:  Yes  Signed IM notice given   Katrina Stack, RN 03/25/2018, 8:06 AM

## 2018-04-14 DIAGNOSIS — G250 Essential tremor: Secondary | ICD-10-CM | POA: Diagnosis not present

## 2018-04-29 DIAGNOSIS — E7800 Pure hypercholesterolemia, unspecified: Secondary | ICD-10-CM | POA: Diagnosis not present

## 2018-05-06 DIAGNOSIS — E7800 Pure hypercholesterolemia, unspecified: Secondary | ICD-10-CM | POA: Diagnosis not present

## 2018-05-06 DIAGNOSIS — E119 Type 2 diabetes mellitus without complications: Secondary | ICD-10-CM | POA: Diagnosis not present

## 2018-05-06 DIAGNOSIS — Z794 Long term (current) use of insulin: Secondary | ICD-10-CM | POA: Diagnosis not present

## 2018-05-06 DIAGNOSIS — I10 Essential (primary) hypertension: Secondary | ICD-10-CM | POA: Diagnosis not present

## 2018-07-07 DIAGNOSIS — I878 Other specified disorders of veins: Secondary | ICD-10-CM | POA: Diagnosis not present

## 2018-07-14 DIAGNOSIS — I878 Other specified disorders of veins: Secondary | ICD-10-CM | POA: Diagnosis not present

## 2018-09-02 DIAGNOSIS — E7800 Pure hypercholesterolemia, unspecified: Secondary | ICD-10-CM | POA: Diagnosis not present

## 2018-09-02 DIAGNOSIS — I10 Essential (primary) hypertension: Secondary | ICD-10-CM | POA: Diagnosis not present

## 2018-09-02 DIAGNOSIS — E119 Type 2 diabetes mellitus without complications: Secondary | ICD-10-CM | POA: Diagnosis not present

## 2018-09-02 DIAGNOSIS — Z794 Long term (current) use of insulin: Secondary | ICD-10-CM | POA: Diagnosis not present

## 2018-09-09 DIAGNOSIS — E7800 Pure hypercholesterolemia, unspecified: Secondary | ICD-10-CM | POA: Diagnosis not present

## 2018-09-09 DIAGNOSIS — I10 Essential (primary) hypertension: Secondary | ICD-10-CM | POA: Diagnosis not present

## 2018-09-09 DIAGNOSIS — E119 Type 2 diabetes mellitus without complications: Secondary | ICD-10-CM | POA: Diagnosis not present

## 2018-09-09 DIAGNOSIS — Z794 Long term (current) use of insulin: Secondary | ICD-10-CM | POA: Diagnosis not present

## 2018-10-13 DIAGNOSIS — G250 Essential tremor: Secondary | ICD-10-CM | POA: Diagnosis not present

## 2019-01-04 DIAGNOSIS — E119 Type 2 diabetes mellitus without complications: Secondary | ICD-10-CM | POA: Diagnosis not present

## 2019-01-04 DIAGNOSIS — I10 Essential (primary) hypertension: Secondary | ICD-10-CM | POA: Diagnosis not present

## 2019-01-04 DIAGNOSIS — Z794 Long term (current) use of insulin: Secondary | ICD-10-CM | POA: Diagnosis not present

## 2019-01-04 DIAGNOSIS — E7800 Pure hypercholesterolemia, unspecified: Secondary | ICD-10-CM | POA: Diagnosis not present

## 2019-01-11 DIAGNOSIS — I10 Essential (primary) hypertension: Secondary | ICD-10-CM | POA: Diagnosis not present

## 2019-01-11 DIAGNOSIS — E119 Type 2 diabetes mellitus without complications: Secondary | ICD-10-CM | POA: Diagnosis not present

## 2019-01-11 DIAGNOSIS — Z0000 Encounter for general adult medical examination without abnormal findings: Secondary | ICD-10-CM | POA: Diagnosis not present

## 2019-01-11 DIAGNOSIS — Z794 Long term (current) use of insulin: Secondary | ICD-10-CM | POA: Diagnosis not present

## 2019-01-11 DIAGNOSIS — E7800 Pure hypercholesterolemia, unspecified: Secondary | ICD-10-CM | POA: Diagnosis not present

## 2019-01-26 DIAGNOSIS — R0982 Postnasal drip: Secondary | ICD-10-CM | POA: Diagnosis not present

## 2019-01-26 DIAGNOSIS — J019 Acute sinusitis, unspecified: Secondary | ICD-10-CM | POA: Diagnosis not present

## 2019-05-05 DIAGNOSIS — E119 Type 2 diabetes mellitus without complications: Secondary | ICD-10-CM | POA: Diagnosis not present

## 2019-05-05 DIAGNOSIS — E7800 Pure hypercholesterolemia, unspecified: Secondary | ICD-10-CM | POA: Diagnosis not present

## 2019-05-05 DIAGNOSIS — Z794 Long term (current) use of insulin: Secondary | ICD-10-CM | POA: Diagnosis not present

## 2019-05-05 DIAGNOSIS — I10 Essential (primary) hypertension: Secondary | ICD-10-CM | POA: Diagnosis not present

## 2019-05-12 DIAGNOSIS — E119 Type 2 diabetes mellitus without complications: Secondary | ICD-10-CM | POA: Diagnosis not present

## 2019-05-12 DIAGNOSIS — I10 Essential (primary) hypertension: Secondary | ICD-10-CM | POA: Diagnosis not present

## 2019-05-12 DIAGNOSIS — L309 Dermatitis, unspecified: Secondary | ICD-10-CM | POA: Diagnosis not present

## 2019-05-12 DIAGNOSIS — Z794 Long term (current) use of insulin: Secondary | ICD-10-CM | POA: Diagnosis not present

## 2019-05-12 DIAGNOSIS — D692 Other nonthrombocytopenic purpura: Secondary | ICD-10-CM | POA: Diagnosis not present

## 2019-05-12 DIAGNOSIS — E7800 Pure hypercholesterolemia, unspecified: Secondary | ICD-10-CM | POA: Diagnosis not present

## 2019-06-05 DIAGNOSIS — L03115 Cellulitis of right lower limb: Secondary | ICD-10-CM | POA: Diagnosis not present

## 2019-06-05 DIAGNOSIS — I878 Other specified disorders of veins: Secondary | ICD-10-CM | POA: Diagnosis not present

## 2019-06-12 DIAGNOSIS — L03115 Cellulitis of right lower limb: Secondary | ICD-10-CM | POA: Diagnosis not present

## 2019-06-12 DIAGNOSIS — I878 Other specified disorders of veins: Secondary | ICD-10-CM | POA: Diagnosis not present

## 2019-06-19 ENCOUNTER — Ambulatory Visit: Payer: PPO | Admitting: Physician Assistant

## 2019-07-06 ENCOUNTER — Other Ambulatory Visit: Payer: Self-pay

## 2019-07-06 ENCOUNTER — Encounter: Payer: PPO | Attending: Physician Assistant | Admitting: Physician Assistant

## 2019-07-06 DIAGNOSIS — H5462 Unqualified visual loss, left eye, normal vision right eye: Secondary | ICD-10-CM | POA: Insufficient documentation

## 2019-07-06 DIAGNOSIS — I10 Essential (primary) hypertension: Secondary | ICD-10-CM | POA: Insufficient documentation

## 2019-07-06 DIAGNOSIS — Z905 Acquired absence of kidney: Secondary | ICD-10-CM | POA: Insufficient documentation

## 2019-07-06 DIAGNOSIS — I890 Lymphedema, not elsewhere classified: Secondary | ICD-10-CM | POA: Diagnosis not present

## 2019-07-06 DIAGNOSIS — M1990 Unspecified osteoarthritis, unspecified site: Secondary | ICD-10-CM | POA: Insufficient documentation

## 2019-07-06 DIAGNOSIS — E11628 Type 2 diabetes mellitus with other skin complications: Secondary | ICD-10-CM | POA: Diagnosis not present

## 2019-07-06 DIAGNOSIS — Z8551 Personal history of malignant neoplasm of bladder: Secondary | ICD-10-CM | POA: Diagnosis not present

## 2019-07-06 DIAGNOSIS — Z8521 Personal history of malignant neoplasm of larynx: Secondary | ICD-10-CM | POA: Diagnosis not present

## 2019-07-06 DIAGNOSIS — Z87891 Personal history of nicotine dependence: Secondary | ICD-10-CM | POA: Diagnosis not present

## 2019-07-06 DIAGNOSIS — R600 Localized edema: Secondary | ICD-10-CM | POA: Diagnosis not present

## 2019-07-06 DIAGNOSIS — I872 Venous insufficiency (chronic) (peripheral): Secondary | ICD-10-CM | POA: Diagnosis not present

## 2019-07-06 DIAGNOSIS — L97909 Non-pressure chronic ulcer of unspecified part of unspecified lower leg with unspecified severity: Secondary | ICD-10-CM | POA: Diagnosis present

## 2019-07-06 DIAGNOSIS — Z85528 Personal history of other malignant neoplasm of kidney: Secondary | ICD-10-CM | POA: Insufficient documentation

## 2019-07-06 NOTE — Progress Notes (Signed)
JEB, SCHLOEMER ([id number]) Visit Report for 07/06/2019 Abuse/Suicide Risk Screen Details Patient Name: SONG, GARRIS. Date of Service: 07/06/2019 1:15 PM Medical Record Number: [id number] Patient Account Number: [id number] Date of Birth/Sex: [birth year] (73 y.o. M) Treating RN: Montey Hora Primary Care Diasha Castleman: Dion Body Other Clinician: Referring Jeronda Don: Dion Body Treating Venus Gilles/Extender: Melburn Hake, HOYT Weeks in Treatment: 0 Abuse/Suicide Risk Screen Items Answer ABUSE RISK SCREEN: Has anyone close to you tried to hurt or harm you recentlyo No Do you feel uncomfortable with anyone in your familyo No Has anyone forced you do things that you didnot want to doo No Electronic Signature(s) Signed: 07/06/2019 4:02:59 PM By: Montey Hora Entered By: Montey Hora on 07/06/2019 13:32:31 Latka, Viviann Spare ([id number]) -------------------------------------------------------------------------------- Activities of Daily Living Details Patient Name: VALENTINE, BARNEY. Date of Service: 07/06/2019 1:15 PM Medical Record Number: [id number] Patient Account Number: [id number] Date of Birth/Sex: 1946/07/28 (73 y.o. M) Treating RN: Montey Hora Primary Care Ha Shannahan: Dion Body Other Clinician: Referring Marvia Troost: Dion Body Treating Waymond Meador/Extender: Melburn Hake, HOYT Weeks in Treatment: 0 Activities of Daily Living Items Answer Activities of Daily Living (Please select one for each item) Drive Automobile Completely Able Take Medications Completely Able Use Telephone Completely Able Care for Appearance Completely Able Use Toilet Completely Able Bath / Shower Completely Able Dress Self Completely Able Feed Self Completely Able Walk Completely Able Get In / Out Bed Completely Able Housework Completely Able Prepare Meals Completely Able Handle Money Completely Able Shop for Self Completely Able Electronic Signature(s) Signed: 07/06/2019 4:02:59 PM  By: Montey Hora Entered By: Montey Hora on 07/06/2019 13:33:12 Lechtenberg, Viviann Spare ([id number]) -------------------------------------------------------------------------------- Education Screening Details Patient Name: [name] Date of Service: 07/06/2019 1:15 PM Medical Record Number: [id number] Patient Account Number: [id number] Date of Birth/Sex: 1946-06-19 (73 y.o. M) Treating RN: Montey Hora Primary Care Christopher Glasscock: Dion Body Other Clinician: Referring Desi Carby: Dion Body Treating Jerian Morais/Extender: Sharalyn Ink in Treatment: 0 Primary Learner Assessed: Patient Learning Preferences/Education Level/Primary Language Learning Preference: Explanation, Demonstration Highest Education Level: High School Preferred Language: English Cognitive Barrier Language Barrier: No Translator Needed: No Memory Deficit: No Emotional Barrier: No Cultural/Religious Beliefs Affecting Medical Care: No Physical Barrier Impaired Vision: No Impaired Hearing: No Decreased Hand dexterity: No Knowledge/Comprehension Knowledge Level: Medium Comprehension Level: Medium Ability to understand written Medium instructions: Ability to understand verbal Medium instructions: Motivation Anxiety Level: Calm Cooperation: Cooperative Education Importance: Acknowledges Need Interest in Health Problems: Asks Questions Perception: Coherent Willingness to Engage in Self- Medium Management Activities: Readiness to Engage in Self- Medium Management Activities: Electronic Signature(s) Signed: 07/06/2019 4:02:59 PM By: Montey Hora Entered By: Montey Hora on 07/06/2019 13:33:56 Brim, Viviann Spare ([id number]) -------------------------------------------------------------------------------- Fall Risk Assessment Details Patient Name: [name] Date of Service: 07/06/2019 1:15 PM Medical Record Number: [id number] Patient Account Number: [id number] Date of  Birth/Sex: 02-17-1946 (73 y.o. M) Treating RN: Montey Hora Primary Care Becker Christopher: Dion Body Other Clinician: Referring Caci Orren: Dion Body Treating Kalee Broxton/Extender: Melburn Hake, HOYT Weeks in Treatment: 0 Fall Risk Assessment Items Have you had 2 or more falls in the last 12 monthso 0 No Have you had any fall that resulted in injury in the last 12 monthso 0 No FALLS RISK SCREEN History of falling - immediate or within 3 months 0 No Secondary diagnosis (Do you have 2 or more medical diagnoseso) 0 No Ambulatory aid None/bed rest/wheelchair/nurse 0 Yes Crutches/cane/walker 0 No Furniture 0 No Intravenous therapy Access/Saline/Heparin Lock 0 No Gait/Transferring Normal/ bed rest/ wheelchair 0 No Weak (  short steps with or without shuffle, stooped but able to lift head while 10 Yes walking, may seek support from furniture) Impaired (short steps with shuffle, may have difficulty arising from chair, head 0 No down, impaired balance) Mental Status Oriented to own ability 0 Yes Electronic Signature(s) Signed: 07/06/2019 4:02:59 PM By: Montey Hora Entered By: Montey Hora on 07/06/2019 13:34:05 Godino, Viviann Spare ([id number]) -------------------------------------------------------------------------------- Foot Assessment Details Patient Name: [name] Date of Service: 07/06/2019 1:15 PM Medical Record Number: [id number] Patient Account Number: [id number] Date of Birth/Sex: 1946/06/25 (73 y.o. M) Treating RN: Montey Hora Primary Care Loras Grieshop: Dion Body Other Clinician: Referring Jaksen Fiorella: Dion Body Treating Arli Bree/Extender: Melburn Hake, HOYT Weeks in Treatment: 0 Foot Assessment Items Site Locations + = Sensation present, - = Sensation absent, C = Callus, U = Ulcer R = Redness, W = Warmth, M = Maceration, PU = Pre-ulcerative lesion F = Fissure, S = Swelling, D = Dryness Assessment Right: Left: Other Deformity: No No Prior Foot  Ulcer: No No Prior Amputation: No No Charcot Joint: No No Ambulatory Status: Ambulatory Without Help Gait: Steady Electronic Signature(s) Signed: 07/06/2019 4:02:59 PM By: Montey Hora Entered By: Montey Hora on 07/06/2019 13:45:37 Freid, Viviann Spare ([id number]) -------------------------------------------------------------------------------- Nutrition Risk Screening Details Patient Name: [name]. Date of Service: 07/06/2019 1:15 PM Medical Record Number: [id number] Patient Account Number: [id number] Date of Birth/Sex: 08/30/1946 (73 y.o. M) Treating RN: Montey Hora Primary Care Harlo Fabela: Dion Body Other Clinician: Referring Raad Clayson: Dion Body Treating Traves Majchrzak/Extender: Melburn Hake, HOYT Weeks in Treatment: 0 Height (in): 72 Weight (lbs): 245 Body Mass Index (BMI): 33.2 Nutrition Risk Screening Items Score Screening NUTRITION RISK SCREEN: I have an illness or condition that made me change the kind and/or amount of 0 No food I eat I eat fewer than two meals per day 0 No I eat few fruits and vegetables, or milk products 0 No I have three or more drinks of beer, liquor or wine almost every day 0 No I have tooth or mouth problems that make it hard for me to eat 0 No I don't always have enough money to buy the food I need 0 No I eat alone most of the time 0 No I take three or more different prescribed or over-the-counter drugs a day 1 Yes Without wanting to, I have lost or gained 10 pounds in the last six months 0 No I am not always physically able to shop, cook and/or feed myself 0 No Nutrition Protocols Good Risk Protocol 0 No interventions needed Moderate Risk Protocol High Risk Proctocol Risk Level: Good Risk Score: 1 Electronic Signature(s) Signed: 07/06/2019 4:02:59 PM By: Montey Hora Entered By: Montey Hora on 07/06/2019 13:34:12

## 2019-07-09 NOTE — Progress Notes (Signed)
JAMAURIE, BERNIER ([id number]) Visit Report for 07/06/2019 Chief Complaint Document Details Patient Name: [name], [name]. Date of Service: 07/06/2019 1:15 PM Medical Record Number: [id number] Patient Account Number: [id number] Date of Birth/Sex: [birth year] (73 y.o. M) Treating RN: Army Melia Primary Care Provider: Dion Body Other Clinician: Referring Provider: Dion Body Treating Provider/Extender: Melburn Hake, HOYT Weeks in Treatment: 0 Information Obtained from: Patient Chief Complaint Bilateral LE edema Electronic Signature(s) Signed: 07/09/2019 4:13:38 PM By: Worthy Keeler PA-C Entered By: Worthy Keeler on 07/06/2019 13:52:25 [name], [name] ([id number]) -------------------------------------------------------------------------------- HPI Details Patient Name: [name] Date of Service: 07/06/2019 1:15 PM Medical Record Number: [id number] Patient Account Number: [id number] Date of Birth/Sex: 05-Jan-1946 (73 y.o. M) Treating RN: Army Melia Primary Care Provider: Dion Body Other Clinician: Referring Provider: Dion Body Treating Provider/Extender: Melburn Hake, HOYT Weeks in Treatment: 0 History of Present Illness HPI Description: 07/06/19 but evaluation today patient patient presents for initial evaluation our clinic concerning issues that he's been having with his bilateral lower extremities for some time. He has actually appears to be doing rather well more recently although he does have a blister on the right anterior shin in several areas that appear to be showing signs of not being completely open but have recently been so. This is on the bilateral lower extremities. He does have lymphedema as well as being stasis according to what I'm seeing at this point. As previously been in [employer] with some good result. With that being said I do believe that based on what I'm seeing at this point he may benefit from a compression wrap  to squeeze out some of the fluid and then measure him for appropriate compression stockings size for him to pick something up at an elastic therapy in [place]. The patient is not opposed to this. The good news is there's no signs of infection and no obvious and significant wounds at this point. He does have a history of hypertension, he's had his right kidney removed secondary to cancer from what he tells me, and again does have lymphedema as well as chronic venous insufficiency. The good news is he has excellent APIs and seems to have great blood flow with good pulses. Electronic Signature(s) Signed: 07/09/2019 4:13:38 PM By: Worthy Keeler PA-C Entered By: Worthy Keeler on 07/06/2019 14:04:20 [name], [name] ([id number]) -------------------------------------------------------------------------------- Physical Exam Details Patient Name: [name], [name]. Date of Service: 07/06/2019 1:15 PM Medical Record Number: [id number] Patient Account Number: [id number] Date of Birth/Sex: 1946/01/05 (73 y.o. M) Treating RN: Army Melia Primary Care Provider: Dion Body Other Clinician: Referring Provider: Dion Body Treating Provider/Extender: Melburn Hake, HOYT Weeks in Treatment: 0 Constitutional patient is hypertensive.. pulse regular and within target range for patient.Marland Kitchen respirations regular, non-labored and within target range for patient.Marland Kitchen temperature within target range for patient.. Well-nourished and well-hydrated in no acute distress. Eyes conjunctiva clear no eyelid edema noted. pupils equal round and reactive to light and accommodation. Ears, Nose, Mouth, and Throat no gross abnormality of ear auricles or external auditory canals. normal hearing noted during conversation. mucus membranes moist. Respiratory normal breathing without difficulty. clear to auscultation bilaterally. Cardiovascular regular rate and rhythm with normal S1, S2. 2+ dorsalis pedis/posterior  tibialis pulses. no clubbing, cyanosis, significant edema, <3 sec cap refill. Gastrointestinal (GI) soft, non-tender, non-distended, +BS. no ventral hernia noted. Musculoskeletal normal gait and posture. no significant deformity or arthritic changes, no loss or range of motion, no clubbing. Psychiatric this patient is able to  make decisions and demonstrates good insight into disease process. Alert and Oriented x 3. pleasant and cooperative. Notes Upon inspection today patient's wound bed actually showed signs of good granulation at this time the does not appear to be any signs of active infection which is excellent news. Fortunately he has been tolerating the dressing changes without any complication. No fevers, chills, nausea, or vomiting noted at this time. Electronic Signature(s) Signed: 07/09/2019 4:13:38 PM By: Worthy Keeler PA-C Entered By: Worthy Keeler on 07/06/2019 14:04:52 [name], [name] ([id number]) -------------------------------------------------------------------------------- Physician Orders Details Patient Name: [name], [name]. Date of Service: 07/06/2019 1:15 PM Medical Record Number: [id number] Patient Account Number: [id number] Date of Birth/Sex: [birth year] (73 y.o. M) Treating RN: Army Melia Primary Care Provider: Dion Body Other Clinician: Referring Provider: Dion Body Treating Provider/Extender: Melburn Hake, HOYT Weeks in Treatment: 0 Verbal / Phone Orders: No Diagnosis Coding ICD-10 Coding Code Description I89.0 Lymphedema, not elsewhere classified E11.628 Type 2 diabetes mellitus with other skin complications K48.1 Venous insufficiency (chronic) (peripheral) I10 Essential (primary) hypertension Z90.5 Acquired absence of kidney Wound Cleansing o Clean wound with Normal Saline. Primary Wound Dressing o ABD Pad Dressing Change Frequency o Change dressing every week Follow-up Appointments o Return Appointment in 1  week. Edema Control o 3 Layer Compression System - Bilateral Electronic Signature(s) Signed: 07/06/2019 2:48:20 PM By: Army Melia Signed: 07/09/2019 4:13:38 PM By: Worthy Keeler PA-C Entered By: Army Melia on 07/06/2019 13:58:40 [name], [name] ([id number]) -------------------------------------------------------------------------------- Problem List Details Patient Name: [name], [name]. Date of Service: 07/06/2019 1:15 PM Medical Record Number: [id number] Patient Account Number: [id number] Date of Birth/Sex: 10-30-1946 (73 y.o. M) Treating RN: Army Melia Primary Care Provider: Dion Body Other Clinician: Referring Provider: Dion Body Treating Provider/Extender: Melburn Hake, HOYT Weeks in Treatment: 0 Active Problems ICD-10 Evaluated Encounter Code Description Active Date Today Diagnosis I89.0 Lymphedema, not elsewhere classified 07/06/2019 No Yes E11.628 Type 2 diabetes mellitus with other skin complications 0/01/7740 No Yes I87.2 Venous insufficiency (chronic) (peripheral) 07/06/2019 No Yes I10 Essential (primary) hypertension 07/06/2019 No Yes Z90.5 Acquired absence of kidney 07/06/2019 No Yes Inactive Problems Resolved Problems Electronic Signature(s) Signed: 07/09/2019 4:13:38 PM By: Worthy Keeler PA-C Entered By: Worthy Keeler on 07/06/2019 13:51:50 [name], [name] ([id number]) -------------------------------------------------------------------------------- Progress Note Details Patient Name: [name] Date of Service: 07/06/2019 1:15 PM Medical Record Number: [id number] Patient Account Number: [id number] Date of Birth/Sex: [birth year] (73 y.o. M) Treating RN: Army Melia Primary Care Provider: Dion Body Other Clinician: Referring Provider: Dion Body Treating Provider/Extender: Melburn Hake, HOYT Weeks in Treatment: 0 Subjective Chief Complaint Information obtained from Patient Bilateral LE edema History of Present Illness  (HPI) 07/06/19 but evaluation today patient patient presents for initial evaluation our clinic concerning issues that he's been having with his bilateral lower extremities for some time. He has actually appears to be doing rather well more recently although he does have a blister on the right anterior shin in several areas that appear to be showing signs of not being completely open but have recently been so. This is on the bilateral lower extremities. He does have lymphedema as well as being stasis according to what I'm seeing at this point. As previously been in [employer] with some good result. With that being said I do believe that based on what I'm seeing at this point he may benefit from a compression wrap to squeeze out some of the fluid and then measure him for appropriate compression stockings size  for him to pick something up at an elastic therapy in [place]. The patient is not opposed to this. The good news is there's no signs of infection and no obvious and significant wounds at this point. He does have a history of hypertension, he's had his right kidney removed secondary to cancer from what he tells me, and again does have lymphedema as well as chronic venous insufficiency. The good news is he has excellent APIs and seems to have great blood flow with good pulses. Patient History Information obtained from Patient. Allergies No Known Drug Allergies Family History Diabetes - Siblings, Heart Disease - Siblings, Hypertension - Siblings, No family history of Cancer, Hereditary Spherocytosis, Kidney Disease, Lung Disease, Seizures, Stroke, Thyroid Problems, Tuberculosis. Social History Former smoker - quit 50+ years ago, Marital Status - Married, Alcohol Use - Rarely, Drug Use - No History, Caffeine Use - Daily. Medical History Eyes Denies history of Cataracts, Glaucoma, Optic Neuritis Cardiovascular Patient has history of Hypertension Denies history of Angina, Arrhythmia,  Congestive Heart Failure, Coronary Artery Disease, Deep Vein Thrombosis, Hypotension, Myocardial Infarction, Peripheral Arterial Disease, Peripheral Venous Disease, Phlebitis, Vasculitis Endocrine Patient has history of Type II Diabetes Denies history of Type I Diabetes Genitourinary Denies history of End Stage Renal Disease Seman, MARSHALL KAMPF ([id number]) Integumentary (Skin) Denies history of History of Burn, History of pressure wounds Musculoskeletal Patient has history of Osteoarthritis Denies history of Gout, Rheumatoid Arthritis, Osteomyelitis Oncologic Denies history of Received Chemotherapy, Received Radiation Patient is treated with Insulin, Oral Agents. Blood sugar is tested. 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 Eyes Denies complaints or symptoms of Dry Eyes, Vision Changes, Glasses / Contacts. Ear/Nose/Mouth/Throat Denies complaints or symptoms of Difficult clearing ears, Sinusitis. Hematologic/Lymphatic Denies complaints or symptoms of Bleeding / Clotting Disorders, Human Immunodeficiency Virus. Respiratory Denies complaints or symptoms of Chronic or frequent coughs, Shortness of Breath. Cardiovascular Complains or has symptoms of LE edema. Denies complaints or symptoms of Chest pain. Gastrointestinal Denies complaints or symptoms of Frequent diarrhea, Nausea, Vomiting. Endocrine Denies complaints or symptoms of Hepatitis, Thyroid disease, Polydypsia (Excessive Thirst). Genitourinary Denies complaints or symptoms of Kidney failure/ Dialysis, Incontinence/dribbling. Immunological Denies complaints or symptoms of Hives, Itching. Integumentary (Skin) Complains or has symptoms of  Wounds, Swelling. Denies complaints or symptoms of Bleeding or bruising tendency, Breakdown. Musculoskeletal Denies complaints or symptoms of Muscle Pain, Muscle Weakness. Neurologic Denies complaints or symptoms of Numbness/parasthesias, Focal/Weakness. Psychiatric Denies complaints or symptoms of Anxiety, Claustrophobia. Objective [name], [name] ([id number]) Constitutional patient is hypertensive.. pulse regular and within target range for patient.Marland Kitchen respirations regular, non-labored and within target range for patient.Marland Kitchen temperature within target range for patient.. Well-nourished and well-hydrated in no acute distress. Vitals Time Taken: 1:30 PM, Height: 72 in, Source: Measured, Weight: 245 lbs, Source: Measured, BMI: 33.2, Temperature: 98.4 F, Pulse: 74 bpm, Respiratory Rate: 16 breaths/min, Blood Pressure: 155/68 mmHg. Eyes conjunctiva clear no eyelid edema noted. pupils equal round and reactive to light and accommodation. Ears, Nose, Mouth, and Throat no gross abnormality of ear auricles or external auditory canals. normal hearing noted during conversation. mucus membranes moist. Respiratory normal breathing without difficulty. clear to auscultation bilaterally. Cardiovascular regular rate and rhythm with normal S1, S2. 2+ dorsalis pedis/posterior tibialis pulses. no clubbing, cyanosis, significant edema, Gastrointestinal (GI) soft,  non-tender, non-distended, +BS. no ventral hernia noted. Musculoskeletal normal gait and posture. no significant deformity or arthritic changes, no loss or range of motion, no clubbing. Psychiatric this patient is able to make decisions and demonstrates good insight into disease process. Alert and Oriented x 3. pleasant and cooperative. General Notes: Upon inspection today patient's wound bed actually showed signs of good granulation at this time the does not appear to be any signs of active infection which is excellent news. Fortunately he has  been tolerating the dressing changes without any complication. No fevers, chills, nausea, or vomiting noted at this time. Other Condition(s) Patient presents with Lymphedema located on the Bilateral Leg. General Notes: BLE edema with no open areas at this time Assessment Active Problems ICD-10 Lymphedema, not elsewhere classified Type 2 diabetes mellitus with other skin complications Venous insufficiency (chronic) (peripheral) Essential (primary) hypertension Acquired absence of kidney [name], [name]. ([id number]) Procedures There was a Three Layer Compression Therapy Procedure with a pre-treatment ABI of 1 by Army Melia, RN. Post procedure Diagnosis Wound #: Same as Pre-Procedure Plan Wound Cleansing: Clean wound with Normal Saline. Primary Wound Dressing: ABD Pad Dressing Change Frequency: Change dressing every week Follow-up Appointments: Return Appointment in 1 week. Edema Control: 3 Layer Compression System - Bilateral My suggestion at this point is gonna be that we go ahead and initiate the above wound care measures for the next week. Again he doesn't have any significant wounds but he does have lymphedema which I think coupled with a blister on the right lower extremity he would benefit from is wrapping him in order to get his swelling down and then hopefully measure him for compression therapy next week. He is in agreement with this plan. We will subsequently see him back for reevaluation again in one weeks time to keep the wrap on between now and then. If anything worsens or changes he knows let me know. Please see above for specific wound care orders. We will see patient for re-evaluation in 1 week(s) here in the clinic. If anything worsens or changes patient will contact our office for additional recommendations. Electronic Signature(s) Signed: 07/09/2019 4:13:38 PM By: Worthy Keeler PA-C Entered By: Worthy Keeler on 07/06/2019 14:05:06 Kinzler, [name]  ([id number]) -------------------------------------------------------------------------------- ROS/PFSH Details Patient Name: [name], JAWAD. Date of Service: 07/06/2019 1:15 PM Medical Record Number: [id number] Patient Account Number: [id number] Date of Birth/Sex: 1946/11/10 (73 y.o. M) Treating RN: Montey Hora Primary Care Provider: Dion Body Other Clinician: Referring Provider: Dion Body Treating Provider/Extender: Melburn Hake, HOYT Weeks in Treatment: 0 Information Obtained From Patient Constitutional Symptoms (General Health) Complaints and Symptoms: Negative for: Fatigue; Fever; Chills; Marked Weight Change Eyes Complaints and Symptoms: Negative for: Dry Eyes; Vision Changes; Glasses / Contacts Medical History: Negative for: Cataracts; Glaucoma; Optic Neuritis Past Medical History Notes: blind in left eye Ear/Nose/Mouth/Throat Complaints and Symptoms: Negative for: Difficult clearing ears; Sinusitis Hematologic/Lymphatic Complaints and Symptoms: Negative for: Bleeding / Clotting Disorders; Human Immunodeficiency Virus Respiratory Complaints and Symptoms: Negative for: Chronic or frequent coughs; Shortness of Breath Cardiovascular Complaints and Symptoms: Positive for: LE edema Negative for: Chest pain Medical History: Positive for: Hypertension Negative for: Angina; Arrhythmia; Congestive Heart Failure; Coronary Artery Disease; Deep Vein Thrombosis; Hypotension; Myocardial Infarction; Peripheral Arterial Disease; Peripheral Venous Disease; Phlebitis; Vasculitis Gastrointestinal Complaints and Symptoms: Negative for: Frequent diarrhea; Nausea; Vomiting Endocrine LAWTON, DOLLINGER. ([id number]) Complaints and Symptoms: Negative for: Hepatitis; Thyroid disease; Polydypsia (Excessive Thirst) Medical History: Positive for: Type II Diabetes Negative for: Type  I Diabetes Treated with: Insulin, Oral agents Blood sugar tested every day: Yes Tested :  BID Genitourinary Complaints and Symptoms: Negative for: Kidney failure/ Dialysis; Incontinence/dribbling Medical History: Negative for: End Stage Renal Disease Past Medical History Notes: right kidney has been removed Immunological Complaints and Symptoms: Negative for: Hives; Itching Integumentary (Skin) Complaints and Symptoms: Positive for: Wounds; Swelling Negative for: Bleeding or bruising tendency; Breakdown Medical History: Negative for: History of Burn; History of pressure wounds Musculoskeletal Complaints and Symptoms: Negative for: Muscle Pain; Muscle Weakness Medical History: Positive for: Osteoarthritis Negative for: Gout; Rheumatoid Arthritis; Osteomyelitis Neurologic Complaints and Symptoms: Negative for: Numbness/parasthesias; Focal/Weakness Psychiatric Complaints and Symptoms: Negative for: Anxiety; Claustrophobia Oncologic Medical History: Negative for: Received Chemotherapy; Received Radiation Past Medical History Notes: renal cancer - removed right kidney; throat cancer; bladder cancer GUNTER, CONDE ([id number]) Immunizations Pneumococcal Vaccine: Received Pneumococcal Vaccination: No Implantable Devices None Family and Social History Cancer: No; Diabetes: Yes - Siblings; Heart Disease: Yes - Siblings; Hereditary Spherocytosis: No; Hypertension: Yes - Siblings; Kidney Disease: No; Lung Disease: No; Seizures: No; Stroke: No; Thyroid Problems: No; Tuberculosis: No; Former smoker - quit 50+ years ago; Marital Status - Married; Alcohol Use: Rarely; Drug Use: No History; Caffeine Use: Daily; Financial Concerns: No; Food, Clothing or Shelter Needs: No; Support System Lacking: No; Transportation Concerns: No Electronic Signature(s) Signed: 07/06/2019 4:02:59 PM By: Montey Hora Signed: 07/09/2019 4:13:38 PM By: Worthy Keeler PA-C Entered By: Montey Hora on 07/06/2019 13:40:41 Glas, [name]  ([id number]) -------------------------------------------------------------------------------- SuperBill Details Patient Name: CEVIN, RUBINSTEIN. Date of Service: 07/06/2019 Medical Record Number: [id number] Patient Account Number: [id number] Date of Birth/Sex: 07-Dec-1946 (73 y.o. M) Treating RN: Army Melia Primary Care Provider: Dion Body Other Clinician: Referring Provider: Dion Body Treating Provider/Extender: Melburn Hake, HOYT Weeks in Treatment: 0 Diagnosis Coding ICD-10 Codes Code Description I89.0 Lymphedema, not elsewhere classified E11.628 Type 2 diabetes mellitus with other skin complications F16.3 Venous insufficiency (chronic) (peripheral) I10 Essential (primary) hypertension Z90.5 Acquired absence of kidney Facility Procedures CPT4: Description Modifier Quantity Code [id number] - WOUND CARE VISIT-LEV 2 EST PT 1 CPT4: [id number] BILATERAL: Application of multi-layer venous compression system; leg (below 1 knee), including ankle and foot. Physician Procedures CPT4 Code: [id number] Description: WC PHYS LEVEL 3 o NEW PT ICD-10 Diagnosis Description I89.0 Lymphedema, not elsewhere classified E11.628 Type 2 diabetes mellitus with other skin complications M22.6 Venous insufficiency (chronic) (peripheral) I10 Essential (primary)  hypertension Modifier: Quantity: 1 Electronic Signature(s) Signed: 07/09/2019 4:13:38 PM By: Worthy Keeler PA-C Entered By: Worthy Keeler on 07/06/2019 14:05:21

## 2019-07-09 NOTE — Progress Notes (Signed)
Martin, [name] ([id number]) Visit Report for 07/06/2019 Allergy List Details Patient Name: [name], [name]. Date of Service: 07/06/2019 1:15 PM Medical Record Number: [id number] Patient Account Number: [id number] Date of Birth/Sex: 1946/10/17 (73 y.o. M) Treating RN: Montey Hora Primary Care Lynk Marti: Dion Body Other Clinician: Referring Jaice Lague: Dion Body Treating Aryanne Gilleland/Extender: Melburn Hake, HOYT Weeks in Treatment: 0 Allergies Active Allergies No Known Drug Allergies Allergy Notes Electronic Signature(s) Signed: 07/06/2019 4:02:59 PM By: Montey Hora Entered By: Montey Hora on 07/06/2019 13:32:22 Gianino, Viviann Spare ([id number]) -------------------------------------------------------------------------------- Arrival Information Details Patient Name: Martin, [name]. Date of Service: 07/06/2019 1:15 PM Medical Record Number: [id number] Patient Account Number: [id number] Date of Birth/Sex: 1946-07-22 (73 y.o. M) Treating RN: Montey Hora Primary Care Emarion Toral: Dion Body Other Clinician: Referring Tannisha Kennington: Dion Body Treating Rumi Kolodziej/Extender: Melburn Hake, HOYT Weeks in Treatment: 0 Visit Information Patient Arrived: Ambulatory Arrival Time: 13:30 Accompanied By: self Transfer Assistance: None Patient Identification Verified: Yes Secondary Verification Process Completed: Yes Patient Has Alerts: Yes Patient Alerts: DMII Electronic Signature(s) Signed: 07/06/2019 4:02:59 PM By: Montey Hora Entered By: Montey Hora on 07/06/2019 13:30:29 Kettles, Viviann Spare ([id number]) -------------------------------------------------------------------------------- Clinic Level of Care Assessment Details Patient Name: [name] Date of Service: 07/06/2019 1:15 PM Medical Record Number: [id number] Patient Account Number: [id number] Date of Birth/Sex: 12/14/1946 (73 y.o. M) Treating RN: Army Melia Primary Care Tanairi Cypert: Dion Body  Other Clinician: Referring Caylan Chenard: Dion Body Treating Regana Kemple/Extender: Melburn Hake, HOYT Weeks in Treatment: 0 Clinic Level of Care Assessment Items TOOL 1 Quantity Score X - Use when EandM and Procedure is performed on INITIAL visit 1 0 ASSESSMENTS - Nursing Assessment / Reassessment X - General Physical Exam (combine w/ comprehensive assessment (listed just below) when 1 20 performed on new pt. evals) X- 1 25 Comprehensive Assessment (HX, ROS, Risk Assessments, Wounds Hx, etc.) ASSESSMENTS - Wound and Skin Assessment / Reassessment []  - Dermatologic / Skin Assessment (not related to wound area) 0 ASSESSMENTS - Ostomy and/or Continence Assessment and Care []  - Incontinence Assessment and Management 0 []  - 0 Ostomy Care Assessment and Management (repouching, etc.) PROCESS - Coordination of Care X - Simple Patient / Family Education for ongoing care 1 15 []  - 0 Complex (extensive) Patient / Family Education for ongoing care []  - 0 Staff obtains Programmer, systems, Records, Test Results / Process Orders []  - 0 Staff telephones HHA, Nursing Homes / Clarify orders / etc []  - 0 Routine Transfer to another Facility (non-emergent condition) []  - 0 Routine Hospital Admission (non-emergent condition) X- 1 15 New Admissions / Biomedical engineer / Ordering NPWT, Apligraf, etc. []  - 0 Emergency Hospital Admission (emergent condition) PROCESS - Special Needs []  - Pediatric / Minor Patient Management 0 []  - 0 Isolation Patient Management []  - 0 Hearing / Language / Visual special needs []  - 0 Assessment of Community assistance (transportation, D/C planning, etc.) []  - 0 Additional assistance / Altered mentation []  - 0 Support Surface(s) Assessment (bed, cushion, seat, etc.) CRUE, OTERO ([id number]) INTERVENTIONS - Miscellaneous []  - External ear exam 0 []  - 0 Patient Transfer (multiple staff / Civil Service fast streamer / Similar devices) []  - 0 Simple Staple / Suture removal (25  or less) []  - 0 Complex Staple / Suture removal (26 or more) []  - 0 Hypo/Hyperglycemic Management (do not check if billed separately) []  - 0 Ankle / Brachial Index (ABI) - do not check if billed separately Has the patient been seen at the hospital within the last three years: Yes Total Score:  75 Level Of Care: New/Established - Level 2 Electronic Signature(s) Signed: 07/06/2019 2:48:20 PM By: Army Melia Entered By: Army Melia on 07/06/2019 13:59:33 Brabant, Viviann Spare ([id number]) -------------------------------------------------------------------------------- Compression Therapy Details Patient Name: [name] Date of Service: 07/06/2019 1:15 PM Medical Record Number: [id number] Patient Account Number: [id number] Date of Birth/Sex: Aug 19, 1946 (73 y.o. M) Treating RN: Army Melia Primary Care Bethene Hankinson: Dion Body Other Clinician: Referring Dalana Pfahler: Dion Body Treating Charelle Petrakis/Extender: Melburn Hake, HOYT Weeks in Treatment: 0 Compression Therapy Performed for Wound Assessment: NonWound Condition Lymphedema - Bilateral Leg Performed By: Clinician Army Melia, RN Compression Type: Three Layer Pre Treatment ABI: 1 Post Procedure Diagnosis Same as Pre-procedure Electronic Signature(s) Signed: 07/06/2019 2:48:20 PM By: Army Melia Entered By: Army Melia on 07/06/2019 13:57:41 Biondo, Viviann Spare ([id number]) -------------------------------------------------------------------------------- Encounter Discharge Information Details Patient Name: Martin, [name]. Date of Service: 07/06/2019 1:15 PM Medical Record Number: [id number] Patient Account Number: [id number] Date of Birth/Sex: May 09, 1946 (73 y.o. M) Treating RN: Cornell Barman Primary Care Mercer Stallworth: Dion Body Other Clinician: Referring Peytan Andringa: Dion Body Treating Mabry Santarelli/Extender: Melburn Hake, HOYT Weeks in Treatment: 0 Encounter Discharge Information Items Discharge Condition:  Stable Ambulatory Status: Ambulatory Discharge Destination: Home Transportation: Private Auto Accompanied By: self Schedule Follow-up Appointment: Yes Clinical Summary of Care: Electronic Signature(s) Signed: 07/06/2019 5:18:52 PM By: Gretta Cool, BSN, RN, CWS, Kim RN, BSN Entered By: Gretta Cool, BSN, RN, CWS, Kim on 07/06/2019 14:21:47 [name] ([id number]) -------------------------------------------------------------------------------- Lower Extremity Assessment Details Patient Name: DACARI, BECKSTRAND. Date of Service: 07/06/2019 1:15 PM Medical Record Number: [id number] Patient Account Number: [id number] Date of Birth/Sex: 1946/04/11 (73 y.o. M) Treating RN: Montey Hora Primary Care Yarelly Kuba: Dion Body Other Clinician: Referring Ilaisaane Marts: Dion Body Treating Charika Mikelson/Extender: Melburn Hake, HOYT Weeks in Treatment: 0 Edema Assessment Assessed: [Left: No] [Right: No] Edema: [Left: Yes] [Right: Yes] Calf Left: Right: Point of Measurement: 32 cm From Medial Instep 36 cm 36.5 cm Ankle Left: Right: Point of Measurement: 11 cm From Medial Instep 24.7 cm 25 cm Vascular Assessment Pulses: Dorsalis Pedis Palpable: [Left:Yes] [Right:Yes] Doppler Audible: [Left:Yes] [Right:Yes] Posterior Tibial Palpable: [Left:Yes] [Right:Yes] Doppler Audible: [Left:Yes] [Right:Yes] Blood Pressure: Brachial: [Left:154] [Right:158] Dorsalis Pedis: 162 [Left:Dorsalis Pedis: 150] Ankle: Posterior Tibial: 165 [Left:Posterior Tibial: 150 1.04] [Right:0.95] Electronic Signature(s) Signed: 07/06/2019 4:02:59 PM By: Montey Hora Entered By: Montey Hora on 07/06/2019 13:44:24 Towle, Viviann Spare ([id number]) -------------------------------------------------------------------------------- Multi Wound Chart Details Patient Name: [name] Date of Service: 07/06/2019 1:15 PM Medical Record Number: [id number] Patient Account Number: [id number] Date of Birth/Sex: [birth year] (73 y.o.  M) Treating RN: Army Melia Primary Care Morty Ortwein: Dion Body Other Clinician: Referring Rease Wence: Dion Body Treating Verne Lanuza/Extender: Melburn Hake, HOYT Weeks in Treatment: 0 Vital Signs Height(in): 72 Pulse(bpm): 74 Weight(lbs): 245 Blood Pressure(mmHg): 155/68 Body Mass Index(BMI): 33 Temperature(F): 98.4 Respiratory Rate 16 (breaths/min): Wound Assessments Treatment Notes Electronic Signature(s) Signed: 07/06/2019 2:48:20 PM By: Army Melia Entered By: Army Melia on 07/06/2019 13:56:25 Cornette, Viviann Spare ([id number]) -------------------------------------------------------------------------------- [place] Details Patient Name: MURPHY, DUZAN. Date of Service: 07/06/2019 1:15 PM Medical Record Number: [id number] Patient Account Number: [id number] Date of Birth/Sex: 01/22/1946 (73 y.o. M) Treating RN: Army Melia Primary Care Rozelia Catapano: Dion Body Other Clinician: Referring Aiyana Stegmann: Dion Body Treating Sharel Behne/Extender: Melburn Hake, HOYT Weeks in Treatment: 0 Active Inactive Orientation to the Wound Care Program Nursing Diagnoses: Knowledge deficit related to the wound healing center program Goals: Patient/caregiver will verbalize understanding of the [place] Program Date Initiated: 07/06/2019 Target Resolution Date: 08/04/2019 Goal Status: Active Interventions:  Provide education on orientation to the wound center Notes: Pain, Acute or Chronic Nursing Diagnoses: Pain, acute or chronic: actual or potential Goals: Patient will verbalize adequate pain control and receive pain control interventions during procedures as needed Date Initiated: 07/06/2019 Target Resolution Date: 08/04/2019 Goal Status: Active Patient/caregiver will verbalize adequate pain control between visits Date Initiated: 07/06/2019 Target Resolution Date: 08/04/2019 Goal Status: Active Interventions: Assess comfort goal upon  admission Provide education on pain management Treatment Activities: Refer to pain specialist or other pain support program : 07/06/2019 Notes: Electronic Signature(s) Signed: 07/06/2019 2:48:20 PM By: Army Melia Entered By: Army Melia on 07/06/2019 13:56:13 Seely, Viviann Spare ([id number]) Oletta Lamas, Viviann Spare ([id number]) -------------------------------------------------------------------------------- Non-Wound Condition Assessment Details Patient Name: [name] Date of Service: 07/06/2019 1:15 PM Medical Record Number: [id number] Patient Account Number: [id number] Date of Birth/Sex: [birth year] (73 y.o. M) Treating RN: Montey Hora Primary Care Nitin Mckowen: Dion Body Other Clinician: Referring Archit Leger: Dion Body Treating Lio Wehrly/Extender: Melburn Hake, HOYT Weeks in Treatment: 0 Non-Wound Condition: Condition: Lymphedema Location: Leg Side: Bilateral Photos Notes BLE edema with no open areas at this time Electronic Signature(s) Signed: 07/06/2019 4:02:59 PM By: Montey Hora Entered By: Montey Hora on 07/06/2019 13:48:07 Enns, Viviann Spare ([id number]) -------------------------------------------------------------------------------- Pain Assessment Details Patient Name: [name] Date of Service: 07/06/2019 1:15 PM Medical Record Number: [id number] Patient Account Number: [id number] Date of Birth/Sex: 12-06-1946 (73 y.o. M) Treating RN: Montey Hora Primary Care Rhea Thrun: Dion Body Other Clinician: Referring Snyder Colavito: Dion Body Treating Hajer Dwyer/Extender: Melburn Hake, HOYT Weeks in Treatment: 0 Active Problems Location of Pain Severity and Description of Pain Patient Has Paino Yes Site Locations Pain Location: Pain in Ulcers With Dressing Change: Yes Duration of the Pain. Constant / Intermittento Intermittent Character of Pain Describe the Pain: Throbbing Pain Management and Medication Current Pain Management: Electronic  Signature(s) Signed: 07/06/2019 4:02:59 PM By: Montey Hora Entered By: Montey Hora on 07/06/2019 13:30:45 [place], Viviann Spare ([id number]) -------------------------------------------------------------------------------- Patient/Caregiver Education Details Patient Name: [name] Date of Service: 07/06/2019 1:15 PM Medical Record Number: [id number] Patient Account Number: [id number] Date of Birth/Gender: 1946/04/10 (73 y.o. M) Treating RN: Army Melia Primary Care Physician: Dion Body Other Clinician: Referring Physician: Dion Body Treating Physician/Extender: Sharalyn Ink in Treatment: 0 Education Assessment Education Provided To: Patient Education Topics Provided Welcome To [facility]: Handouts: Welcome To [facility] Methods: Demonstration, Explain/Verbal Responses: State content correctly Wound/Skin Impairment: Handouts: Caring for Your Ulcer Methods: Demonstration, Explain/Verbal Responses: State content correctly Electronic Signature(s) Signed: 07/06/2019 2:48:20 PM By: Army Melia Entered By: Army Melia on 07/06/2019 14:00:02 Jasmin, Viviann Spare ([id number]) -------------------------------------------------------------------------------- [place] Details Patient Name: [name]. Date of Service: 07/06/2019 1:15 PM Medical Record Number: [id number] Patient Account Number: [id number] Date of Birth/Sex: 08/27/1946 (73 y.o. M) Treating RN: Montey Hora Primary Care Aila Terra: Dion Body Other Clinician: Referring Hind Chesler: Dion Body Treating Delray Reza/Extender: Melburn Hake, HOYT Weeks in Treatment: 0 Vital Signs Time Taken: 13:30 Temperature (F): 98.4 Height (in): 72 Pulse (bpm): 74 Source: Measured Respiratory Rate (breaths/min): 16 Weight (lbs): 245 Blood Pressure (mmHg): 155/68 Source: Measured Reference Range: 80 - 120 mg / dl Body Mass Index (BMI): 33.2 Electronic Signature(s) Signed:  07/06/2019 4:02:59 PM By: Montey Hora Entered By: Montey Hora on 07/06/2019 13:32:11

## 2019-07-13 ENCOUNTER — Ambulatory Visit: Payer: PPO | Admitting: Physician Assistant

## 2019-07-14 ENCOUNTER — Encounter: Payer: PPO | Admitting: Physician Assistant

## 2019-07-14 ENCOUNTER — Other Ambulatory Visit: Payer: Self-pay

## 2019-07-14 DIAGNOSIS — I890 Lymphedema, not elsewhere classified: Secondary | ICD-10-CM | POA: Diagnosis not present

## 2019-07-14 DIAGNOSIS — I872 Venous insufficiency (chronic) (peripheral): Secondary | ICD-10-CM | POA: Diagnosis not present

## 2019-07-16 NOTE — Progress Notes (Signed)
[name] ([id number]) Visit Report for 07/14/2019 Arrival Information Details Patient Name: [name], [name]. Date of Service: 07/14/2019 3:00 PM Medical Record Number: [id number] Patient Account Number: [id number] Date of Birth/Sex: Apr 19, 1946 (73 y.o. M) Treating RN: [name] Primary Care [name]: [name] Other Clinician: Referring [name]: [name] Treating [name]/Extender: [name], [name] [name] in Treatment: 1 Visit Information History Since Last Visit Added or deleted any medications: No Patient Arrived: Ambulatory Any new allergies or adverse reactions: No Arrival Time: 15:05 Had a fall or experienced change in No Accompanied By: self activities of daily living that may affect Transfer Assistance: None risk of falls: Patient Identification Verified: Yes Signs or symptoms of abuse/neglect since last visito No Secondary Verification Process Completed: Yes Hospitalized since last visit: No Patient Has Alerts: Yes Has Dressing in Place as Prescribed: Yes Patient Alerts: DMII Has Compression in Place as Prescribed: Yes Pain Present Now: No Electronic Signature(s) Signed: 07/14/2019 5:22:32 PM By: [name] Entered By: [name] on 07/14/2019 15:05:30 [name], [name] ([id number]) -------------------------------------------------------------------------------- Compression Therapy Details Patient Name: [name] Date of Service: 07/14/2019 3:00 PM Medical Record Number: [id number] Patient Account Number: [id number] Date of Birth/Sex: 03-24-1946 (73 y.o. M) Treating RN: [name] Primary Care [name]: [name] Other Clinician: Referring [name]: [name] Treating [name]/Extender: [name], [name] [name] in Treatment: 1 Compression Therapy Performed for Wound Assessment: NonWound Condition Lymphedema - Bilateral Leg Performed By: Clinician Martin Hora, RN Compression Type: Three Layer Pre  Treatment ABI: 1 Post Procedure Diagnosis Same as Pre-procedure Electronic Signature(s) Signed: 07/14/2019 5:37:40 PM By: [name] Entered By: [name] on 07/14/2019 15:43:00 [name], [name] ([id number]) -------------------------------------------------------------------------------- Encounter Discharge Information Details Patient Name: [name]. Date of Service: 07/14/2019 3:00 PM Medical Record Number: [id number] Patient Account Number: [id number] Date of Birth/Sex: [birth year] (73 y.o. M) Treating RN: [name] Primary Care Kenniel Bergsma: [name] Other Clinician: Referring [name]: [name] Treating [name]/Extender: [name], [name] [name] in Treatment: 1 Encounter Discharge Information Items Discharge Condition: Stable Ambulatory Status: Ambulatory Discharge Destination: Home Transportation: Private Auto Accompanied By: self Schedule Follow-up Appointment: Yes Clinical Summary of Care: Electronic Signature(s) Signed: 07/14/2019 4:56:53 PM By: [name] Entered By: [name] on 07/14/2019 16:02:36 [name] ([id number]) -------------------------------------------------------------------------------- Lower Extremity Assessment Details Patient Name: [name], [name]. Date of Service: 07/14/2019 3:00 PM Medical Record Number: [id number] Patient Account Number: [id number] Date of Birth/Sex: 07/19/1946 (73 y.o. M) Treating RN: [name] Primary Care Tatym Schermer: [name] Other Clinician: Referring Solae Norling: [name] Treating Sumeet Geter/Extender: [name], [name] [name] in Treatment: 1 Edema Assessment Assessed: [Left: No] [Right: No] [Left: Edema] [Right: :] Calf Left: Right: Point of Measurement: 32 cm From Medial Instep 34 cm 34 cm Ankle Left: Right: Point of Measurement: 11 cm From Medial Instep 22 cm 22 cm Vascular Assessment Pulses: Dorsalis Pedis Palpable: [Left:Yes] [Right:Yes] Posterior  Tibial Palpable: [Left:Yes] [Right:Yes] Electronic Signature(s) Signed: 07/14/2019 5:22:32 PM By: [name] Entered By: [name] on 07/14/2019 15:15:38 [name], [name] ([id number]) -------------------------------------------------------------------------------- Multi Wound Chart Details Patient Name: [name] Date of Service: 07/14/2019 3:00 PM Medical Record Number: [id number] Patient Account Number: [id number] Date of Birth/Sex: 1946-01-13 (73 y.o. M) Treating RN: [name] Primary Care Tiffanee Mcnee: [name] Other Clinician: Referring Brevin Mcfadden: [name] Treating Isadore Bokhari/Extender: [name], [name] [name] in Treatment: 1 Vital Signs Height(in): 72 Pulse(bpm): 79 Weight(lbs): 245 Blood Pressure(mmHg): 143/73 [name] Mass Index(BMI): 33 Temperature(F): 98.3 Respiratory Rate 18 (breaths/min): Wound Assessments Treatment Notes Electronic Signature(s) Signed:  07/14/2019 5:37:40 PM By: [name] Entered By: [name] on 07/14/2019 15:42:07 [name], [name] ([id number]) -------------------------------------------------------------------------------- [place] Details Patient Name: [name], [name]. Date of Service: 07/14/2019 3:00 PM Medical Record Number: [id number] Patient Account Number: [id number] Date of Birth/Sex: 11-10-1946 (73 y.o. M) Treating RN: [name] Primary Care Tarri Guilfoil: [name] Other Clinician: Referring Anari Evitt: [name] Treating Eshani Maestre/Extender: [name], [name] [name] in Treatment: 1 Active Inactive Orientation to the Wound Care Program Nursing Diagnoses: Knowledge deficit related to the wound healing center program Goals: Patient/caregiver will verbalize understanding of the [place] Program Date Initiated: 07/06/2019 Target Resolution Date: 08/04/2019 Goal Status: Active Interventions: Provide education on orientation to the wound  center Notes: Pain, Acute or Chronic Nursing Diagnoses: Pain, acute or chronic: actual or potential Goals: Patient will verbalize adequate pain control and receive pain control interventions during procedures as needed Date Initiated: 07/06/2019 Target Resolution Date: 08/04/2019 Goal Status: Active Patient/caregiver will verbalize adequate pain control between visits Date Initiated: 07/06/2019 Target Resolution Date: 08/04/2019 Goal Status: Active Interventions: Assess comfort goal upon admission Provide education on pain management Treatment Activities: Refer to pain specialist or other pain support program : 07/06/2019 Notes: Electronic Signature(s) Signed: 07/14/2019 5:37:40 PM By: [name] Entered By: [name] on 07/14/2019 15:42:01 [name], [name] ([id number]) [name], [name] ([id number]) -------------------------------------------------------------------------------- Non-Wound Condition Assessment Details Patient Name: [name] Date of Service: 07/14/2019 3:00 PM Medical Record Number: [id number] Patient Account Number: [id number] Date of Birth/Sex: 24-Jan-1946 (73 y.o. M) Treating RN: [name] Primary Care Daina Cara: [name] Other Clinician: Referring Anglia Blakley: [name] Treating Derriona Branscom/Extender: [name], [name] [name] in Treatment: 1 Non-Wound Condition: Condition: Lymphedema Location: Leg Side: Bilateral Photos Electronic Signature(s) Signed: 07/14/2019 5:22:32 PM By: [name] Entered By: [name] on 07/14/2019 15:19:40 [name], [name] ([id number]) -------------------------------------------------------------------------------- Pain Assessment Details Patient Name: JASYN, MEY. Date of Service: 07/14/2019 3:00 PM Medical Record Number: [id number] Patient Account Number: [id number] Date of Birth/Sex: 01-21-1946 (74 y.o. M) Treating RN: [name] Primary Care Itamar Mcgowan: [name] Other  Clinician: Referring Warda Mcqueary: [name] Treating Pharell Rolfson/Extender: [name], [name] [name] in Treatment: 1 Active Problems Location of Pain Severity and Description of Pain Patient Has Paino No Site Locations Pain Management and Medication Current Pain Management: Electronic Signature(s) Signed: 07/14/2019 5:22:32 PM By: [name] Entered By: [name] on 07/14/2019 15:07:52 [name], [name] ([id number]) -------------------------------------------------------------------------------- Patient/Caregiver Education Details Patient Name: [name], [name]. Date of Service: 07/14/2019 3:00 PM Medical Record Number: [id number] Patient Account Number: [id number] Date of Birth/Gender: 02-10-1946 (73 y.o. M) Treating RN: [name] Primary Care Physician: [name] Other Clinician: Referring Physician: Dion [name] Treating Physician/Extender: Sharalyn Ink in Treatment: 1 Education Assessment Education Provided To: Patient Education Topics Provided Venous: Handouts: Other: need for ongoing compression Methods: Explain/Verbal Responses: State content correctly Electronic Signature(s) Signed: 07/14/2019 5:37:40 PM By: [name] Entered By: [name] on 07/14/2019 15:43:52 [name], [name] ([id number]) -------------------------------------------------------------------------------- [place] Details Patient Name: [name] Date of Service: 07/14/2019 3:00 PM Medical Record Number: [id number] Patient Account Number: [id number] Date of Birth/Sex: 1946-09-12 (73 y.o. M) Treating RN: [name] Primary Care Josephine Wooldridge: [name] Other Clinician: Referring Bijou Easler: [name] Treating Yaxiel Minnie/Extender: [name], [name] [name] in Treatment: 1 Vital Signs Time Taken: 15:05 Temperature (F): 98.3 Height (in): 72 Pulse (bpm): 79 Weight (lbs): 245 Respiratory Rate (breaths/min): 18 [name] Mass Index (BMI):  33.2 Blood Pressure (mmHg): 143/73 Reference Range: 80 - 120 mg / dl Electronic  Signature(s) Signed: 07/14/2019 5:22:32 PM By: [name] Entered By: [name] on 07/14/2019 15:08:21

## 2019-07-17 NOTE — Progress Notes (Signed)
Martin, [name] ([id number]) Visit Report for 07/14/2019 Chief Complaint Document Details Patient Name: [name], [name]. Date of Service: 07/14/2019 3:00 PM Medical Record Number: [id number] Patient Account Number: [id number] Date of Birth/Sex: [birth year] (73 y.o. M) Treating RN: Montey Hora Primary Care Provider: Dion Body Other Clinician: Referring Provider: Dion Body Treating Provider/Extender: Melburn Hake, Anzel Kearse Weeks in Treatment: 1 Information Obtained from: Patient Chief Complaint Bilateral LE edema Electronic Signature(s) Signed: 07/16/2019 6:25:26 PM By: Worthy Keeler PA-C Entered By: Worthy Keeler on 07/14/2019 15:35:11 [name], Martin Spare ([id number]) -------------------------------------------------------------------------------- HPI Details Patient Name: [name] Date of Service: 07/14/2019 3:00 PM Medical Record Number: [id number] Patient Account Number: [id number] Date of Birth/Sex: 02/07/1946 (73 y.o. M) Treating RN: Montey Hora Primary Care Provider: Dion Body Other Clinician: Referring Provider: Dion Body Treating Provider/Extender: Melburn Hake, Nijel Flink Weeks in Treatment: 1 History of Present Illness HPI Description: 07/06/19 but evaluation today patient patient presents for initial evaluation our clinic concerning issues that he's been having with his bilateral lower extremities for some time. He has actually appears to be doing rather well more recently although he does have a blister on the right anterior shin in several areas that appear to be showing signs of not being completely open but have recently been so. This is on the bilateral lower extremities. He does have lymphedema as well as being stasis according to what I'm seeing at this point. As previously been in [employer] with some good result. With that being said I do believe that based on what I'm seeing at this point he may benefit from a compression wrap  to squeeze out some of the fluid and then measure him for appropriate compression stockings size for him to pick something up at an elastic therapy in [place]. The patient is not opposed to this. The good news is there's no signs of infection and no obvious and significant wounds at this point. He does have a history of hypertension, he's had his right kidney removed secondary to cancer from what he tells me, and again does have lymphedema as well as chronic venous insufficiency. The good news is he has excellent APIs and seems to have great blood flow with good pulses. 07/14/19 on evaluation today patient actually appears to be doing well with regard to his lower extremity ulcers. In fact based on what I'm seeing at this point I feel like that the patient has done very well and again there does not appear to be any signs of active drainage new infection at this time which is excellent news. He did have a little bit of drainage on the wrap but again I feel like this was mainly due to some older areas that just hadn't completely sealed up. Nonetheless he hasn't gotten his compression stockings yet he does need these and I do think potentially wrapping him when one week before completely discharging him from wound care center would be a benefit for him. Electronic Signature(s) Signed: 07/16/2019 6:25:26 PM By: Worthy Keeler PA-C Entered By: Worthy Keeler on 07/16/2019 17:43:53 [name], Martin Spare ([id number]) -------------------------------------------------------------------------------- Physical Exam Details Patient Name: [name], [name]. Date of Service: 07/14/2019 3:00 PM Medical Record Number: [id number] Patient Account Number: [id number] Date of Birth/Sex: Dec 10, 1946 (73 y.o. M) Treating RN: Montey Hora Primary Care Provider: Dion Body Other Clinician: Referring Provider: Dion Body Treating Provider/Extender: Melburn Hake, Churchill Grimsley Weeks in Treatment:  1 Constitutional Well-nourished and well-hydrated in no acute distress. Respiratory normal breathing without difficulty. clear  to auscultation bilaterally. Cardiovascular regular rate and rhythm with normal S1, S2. Psychiatric this patient is able to make decisions and demonstrates good insight into disease process. Alert and Oriented x 3. pleasant and cooperative. Notes Upon inspection patient's wounds again appear to be for the most part sealed up he does still have some swelling although tremendously improved with the compression wraps which is great news. I'm very pleased in this regard as is the patient. We did measure him today to give him the information the call lasted therapy to get compression stockings going forward. Electronic Signature(s) Signed: 07/16/2019 6:25:26 PM By: Worthy Keeler PA-C Entered By: Worthy Keeler on 07/16/2019 17:44:33 [name], Martin Spare ([id number]) -------------------------------------------------------------------------------- Physician Orders Details Patient Name: [name], [name]. Date of Service: 07/14/2019 3:00 PM Medical Record Number: [id number] Patient Account Number: [id number] Date of Birth/Sex: 17-Feb-1946 (73 y.o. 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 o May shower with protection. - Do not get your wraps wet Dressing Change Frequency o Change dressing every week Follow-up Appointments o Return Appointment in 1 week. Edema  Control o 3 Layer Compression System - Bilateral Electronic Signature(s) Signed: 07/14/2019 5:37:40 PM By: Montey Hora Signed: 07/16/2019 6:25:26 PM By: Worthy Keeler PA-C Entered By: Montey Hora on 07/14/2019 15:42:40 [name], Martin Spare ([id number]) -------------------------------------------------------------------------------- Problem List Details Patient Name: [name], [name]. Date of Service: 07/14/2019 3:00 PM Medical Record Number: [id number] Patient Account Number: [id number] Date of Birth/Sex: 1946/09/16 (73 y.o. 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 complications 04/27/257 No Yes I87.2 Venous insufficiency (chronic) (peripheral) 07/06/2019 No Yes I10 Essential (primary) hypertension 07/06/2019 No Yes Z90.5 Acquired absence of kidney 07/06/2019 No Yes Inactive Problems Resolved Problems Electronic Signature(s) Signed: 07/16/2019 6:25:26 PM By: Worthy Keeler PA-C Entered By: Worthy Keeler on 07/14/2019 15:35:05 Barclift, Martin Spare ([id number]) -------------------------------------------------------------------------------- Progress Note Details Patient Name: [name] Date of Service: 07/14/2019 3:00 PM Medical Record Number: [id number] Patient Account Number: [id number] Date of Birth/Sex: 1946/06/25 (73 y.o. M) Treating RN: Montey Hora Primary Care Provider: Dion Body Other Clinician: Referring Provider: Dion Body Treating Provider/Extender: Melburn Hake, Lathyn Griggs Weeks in Treatment: 1 Subjective Chief Complaint Information obtained from Patient Bilateral LE edema History of Present Illness  (HPI) 07/06/19 but evaluation today patient patient presents for initial evaluation our clinic concerning issues that he's been having with his bilateral lower extremities for some time. He has actually appears to be doing rather well more recently although he does have a blister on the right anterior shin in several areas that appear to be showing signs of not being completely open but have recently been so. This is on the bilateral lower extremities. He does have lymphedema as well as being stasis according to what I'm seeing at this point. As previously been in [employer] with some good result. With that being said I  do believe that based on what I'm seeing at this point he may benefit from a compression wrap to squeeze out some of the fluid and then measure him for appropriate compression stockings size for him to pick something up at an elastic therapy in [place]. The patient is not opposed to this. The good news is there's no signs of infection and no obvious and significant wounds at this point. He does have a history of hypertension, he's had his right kidney removed secondary to cancer from what he tells me, and again does have lymphedema as well as chronic venous insufficiency. The good news is he has excellent APIs and seems to have great blood flow with good pulses. 07/14/19 on evaluation today patient actually appears to be doing well with regard to his lower extremity ulcers. In fact based on what I'm seeing at this point I feel like that the patient has done very well and again there does not appear to be any signs of active drainage new infection at this time which is excellent news. He did have a little bit of drainage on the wrap but again I feel like this was mainly due to some older areas that just hadn't completely sealed up. Nonetheless he hasn't gotten his compression stockings yet he does need these and I do think potentially wrapping him when one week before  completely discharging him from wound care center would be a benefit for him. Patient History Information obtained from Patient. Family History Diabetes - Siblings, Heart Disease - Siblings, Hypertension - Siblings, No family history of Cancer, Hereditary Spherocytosis, Kidney Disease, Lung Disease, Seizures, Stroke, Thyroid Problems, Tuberculosis. Social History Former smoker - quit 50+ years ago, Marital Status - Married, Alcohol Use - Rarely, Drug Use - No History, Caffeine Use - Daily. Medical History Eyes Denies history of Cataracts, Glaucoma, Optic Neuritis Cardiovascular Patient has history of Hypertension Denies history of Angina, Arrhythmia, Congestive Heart Failure, Coronary Artery Disease, Deep Vein Thrombosis, Hypotension, Myocardial Infarction, Peripheral Arterial Disease, Peripheral Venous Disease, Phlebitis, Vasculitis Endocrine Patient has history of Type II Diabetes [name], [name] ([id number]) Denies history of Type I Diabetes Genitourinary Denies history of End Stage Renal Disease Integumentary (Skin) Denies history of History of Burn, History of pressure wounds Musculoskeletal Patient has history of Osteoarthritis Denies history of Gout, Rheumatoid Arthritis, Osteomyelitis Oncologic Denies history of Received Chemotherapy, Received Radiation Medical And Surgical History Notes 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 Respiratory Denies complaints or symptoms of Chronic or frequent coughs, Shortness of Breath. Cardiovascular Complains or has symptoms of LE edema. Denies complaints or symptoms of Chest pain. Psychiatric Denies complaints or symptoms of Anxiety,  Claustrophobia. Objective Constitutional Well-nourished and well-hydrated in no acute distress. Vitals Time Taken: 3:05 PM, Height: 72 in, Weight: 245 lbs, BMI: 33.2, Temperature: 98.3 F, Pulse: 79 bpm, Respiratory Rate: 18 breaths/min, Blood Pressure: 143/73 mmHg. Respiratory normal breathing without difficulty. clear to auscultation bilaterally. Cardiovascular regular rate and rhythm with normal S1, S2. Psychiatric this patient is able to make decisions and demonstrates good insight into disease process. Alert and Oriented x 3. pleasant and cooperative. General Notes: Upon inspection patient's wounds again appear to be for the most part sealed up he does still have some [name], Martin G. ([id number]) swelling although tremendously improved with the compression  wraps which is great news. I'm very pleased in this regard as is the patient. We did measure him today to give him the information the call lasted therapy to get compression stockings going forward. Other Condition(s) Patient presents with Lymphedema located on the Bilateral Leg. Assessment Active Problems ICD-10 Lymphedema, not elsewhere classified Type 2 diabetes mellitus with other skin complications Venous insufficiency (chronic) (peripheral) Essential (primary) hypertension Acquired absence of kidney Procedures There was a Three Layer Compression Therapy Procedure with a pre-treatment ABI of 1 by Montey Hora, RN. Post procedure Diagnosis Wound #: Same as Pre-Procedure Plan Wound Cleansing: Clean wound with Normal Saline. May shower with protection. - Do not get your wraps wet Dressing Change Frequency: Change dressing every week Follow-up Appointments: Return Appointment in 1 week. Edema Control: 3 Layer Compression System - Bilateral I'm gonna suggest that we go ahead and continue with the above wound care measures for the next week and the patient is in agreement the plan. We will subsequently see were  things stand at follow-up. If anything changes worsens to let me know otherwise I hope it will be able to completely discharge him next week from wound care services. Please see above for specific wound care orders. We will see patient for re-evaluation in 1 week(s) here in the clinic. If anything worsens or changes patient will contact our office for additional recommendations. [name], [name] ([id number]) Electronic Signature(s) Signed: 07/16/2019 6:25:26 PM By: Worthy Keeler PA-C Entered By: Worthy Keeler on 07/16/2019 17:44:55 Fowles, Martin Spare ([id number]) -------------------------------------------------------------------------------- ROS/PFSH Details Patient Name: [name], [name]. Date of Service: 07/14/2019 3:00 PM Medical Record Number: [id number] Patient Account Number: [id number] Date of Birth/Sex: [birth year] (73 y.o. M) Treating RN: Montey Hora Primary Care Provider: Dion Body Other Clinician: Referring Provider: Dion Body Treating Provider/Extender: Melburn Hake, Hiroko Tregre Weeks in Treatment: 1 Information Obtained From Patient Constitutional Symptoms (General Health) Complaints and Symptoms: Negative for: Fatigue; Fever; Chills; Marked Weight Change Respiratory Complaints and Symptoms: Negative for: Chronic or frequent coughs; Shortness of Breath Cardiovascular Complaints and Symptoms: Positive for: LE edema Negative for: Chest pain Medical History: Positive for: Hypertension Negative for: Angina; Arrhythmia; Congestive Heart Failure; Coronary Artery Disease; Deep Vein Thrombosis; Hypotension; Myocardial Infarction; Peripheral Arterial Disease; Peripheral Venous Disease; Phlebitis; Vasculitis Psychiatric Complaints and Symptoms: Negative for: Anxiety; Claustrophobia Eyes Medical History: Negative for: Cataracts; Glaucoma; Optic Neuritis Past Medical History Notes: blind in left eye Endocrine Medical History: Positive for: Type II  Diabetes Negative for: Type I Diabetes Treated with: Insulin, Oral agents Blood sugar tested every day: Yes Tested : BID Genitourinary Medical History: Negative for: End Stage Renal Disease Past Medical History Notes: [name], [name] ([id number]) right kidney has been removed Integumentary (Skin) Medical History: Negative for: History of Burn; History of pressure wounds Musculoskeletal Medical History: Positive for: Osteoarthritis Negative for: Gout; Rheumatoid Arthritis; Osteomyelitis Oncologic Medical History: Negative for: Received Chemotherapy; Received Radiation Past Medical History Notes: renal cancer - removed right kidney; throat cancer; bladder cancer Immunizations Pneumococcal Vaccine: Received Pneumococcal Vaccination: No Implantable Devices None Family and Social History Cancer: No; Diabetes: Yes - Siblings; Heart Disease: Yes - Siblings; Hereditary Spherocytosis: No; Hypertension: Yes - Siblings; Kidney Disease: No; Lung Disease: No; Seizures: No; Stroke: No; Thyroid Problems: No; Tuberculosis: No; Former smoker - quit 50+ years ago; Marital Status - Married; Alcohol Use: Rarely; Drug Use: No History; Caffeine Use: Daily; Financial Concerns: No; Food, Clothing or Shelter Needs: No; Support System Lacking: No; Transportation Concerns: No Physician Affirmation I  have reviewed and agree with the above information. Electronic Signature(s) Signed: 07/16/2019 6:25:26 PM By: Worthy Keeler PA-C Signed: 07/17/2019 4:17:58 PM By: Montey Hora Entered By: Worthy Keeler on 07/16/2019 17:44:12 Luepke, Martin Spare ([id number]) -------------------------------------------------------------------------------- SuperBill Details Patient Name: [name], [name]. Date of Service: 07/14/2019 Medical Record Number: [id number] Patient Account Number: [id number] Date of Birth/Sex: 1946-11-16 (73 y.o. M) Treating RN: Montey Hora Primary Care Provider: Dion Body Other  Clinician: Referring Provider: Dion Body Treating Provider/Extender: Melburn Hake, Analaya Hoey Weeks in Treatment: 1 Diagnosis Coding ICD-10 Codes Code Description I89.0 Lymphedema, not elsewhere classified E11.628 Type 2 diabetes mellitus with other skin complications G76.1 Venous insufficiency (chronic) (peripheral) I10 Essential (primary) hypertension Z90.5 Acquired absence of kidney Facility Procedures CPT4: Description Modifier Quantity Code [id number] BILATERAL: Application of multi-layer venous compression system; leg (below 1 knee), including ankle and foot. Physician Procedures CPT4 Code: [id number] Description: 46190 - WC PHYS LEVEL 4 - EST PT ICD-10 Diagnosis Description I89.0 Lymphedema, not elsewhere classified E11.628 Type 2 diabetes mellitus with other skin complications V22.2 Venous insufficiency (chronic) (peripheral) I10 Essential (primary)  hypertension Modifier: Quantity: 1 Electronic Signature(s) Signed: 07/16/2019 6:25:26 PM By: Worthy Keeler PA-C Previous Signature: 07/14/2019 5:37:40 PM Version By: Montey Hora Entered By: Worthy Keeler on 07/16/2019 17:46:03

## 2019-07-20 ENCOUNTER — Encounter: Payer: PPO | Admitting: Physician Assistant

## 2019-07-20 ENCOUNTER — Other Ambulatory Visit: Payer: Self-pay

## 2019-07-20 DIAGNOSIS — L97829 Non-pressure chronic ulcer of other part of left lower leg with unspecified severity: Secondary | ICD-10-CM | POA: Diagnosis not present

## 2019-07-20 DIAGNOSIS — I890 Lymphedema, not elsewhere classified: Secondary | ICD-10-CM | POA: Diagnosis not present

## 2019-07-22 NOTE — Progress Notes (Signed)
RAHN, LACUESTA ([id number]) Visit Report for 07/20/2019 Arrival Information Details Patient Name: [name], [name]. Date of Service: 07/20/2019 2:45 PM Medical Record Number: [id number] Patient Account Number: [id number] Date of Birth/Sex: 1946/09/05 (73 y.o. M) Treating RN: Montey Hora Primary Care Kerolos Nehme: Dion Body Other Clinician: Referring Fred Franzen: Dion Body Treating Jullian Clayson/Extender: Melburn Hake, HOYT Weeks in Treatment: 2 Visit Information History Since Last Visit Added or deleted any medications: No Patient Arrived: Ambulatory Any new allergies or adverse reactions: No Arrival Time: 14:52 Had a fall or experienced change in No Accompanied By: self activities of daily living that may affect Transfer Assistance: None risk of falls: Patient Identification Verified: Yes Signs or symptoms of abuse/neglect since last visito No Secondary Verification Process Completed: Yes Hospitalized since last visit: No Patient Has Alerts: Yes Implantable device outside of the clinic excluding No Patient Alerts: DMII cellular tissue based products placed in the center since last visit: Has Dressing in Place as Prescribed: Yes Has Compression in Place as Prescribed: Yes Pain Present Now: No Electronic Signature(s) Signed: 07/20/2019 4:10:49 PM By: Montey Hora Entered By: Montey Hora on 07/20/2019 14:54:21 [place], [name] ([id number]) -------------------------------------------------------------------------------- Clinic Level of Care Assessment Details Patient Name: [name] Date of Service: 07/20/2019 2:45 PM Medical Record Number: [id number] Patient Account Number: [id number] Date of Birth/Sex: [birth year] (73 y.o. M) Treating RN: Army Melia Primary Care Jadan Hinojos: Dion Body Other Clinician: Referring Chas Axel: Dion Body Treating Forestine Macho/Extender: Melburn Hake, HOYT Weeks in Treatment: 2 Clinic Level of Care Assessment Items TOOL  4 Quantity Score []  - Use when only an EandM is performed on FOLLOW-UP visit 0 ASSESSMENTS - Nursing Assessment / Reassessment X - Reassessment of Co-morbidities (includes updates in patient status) 1 10 X- 1 5 Reassessment of Adherence to Treatment Plan ASSESSMENTS - Wound and Skin Assessment / Reassessment X - Simple Wound Assessment / Reassessment - one wound 1 5 []  - 0 Complex Wound Assessment / Reassessment - multiple wounds []  - 0 Dermatologic / Skin Assessment (not related to wound area) ASSESSMENTS - Focused Assessment []  - Circumferential Edema Measurements - multi extremities 0 []  - 0 Nutritional Assessment / Counseling / Intervention []  - 0 Lower Extremity Assessment (monofilament, tuning fork, pulses) []  - 0 Peripheral Arterial Disease Assessment (using hand held doppler) ASSESSMENTS - Ostomy and/or Continence Assessment and Care []  - Incontinence Assessment and Management 0 []  - 0 Ostomy Care Assessment and Management (repouching, etc.) PROCESS - Coordination of Care X - Simple Patient / Family Education for ongoing care 1 15 []  - 0 Complex (extensive) Patient / Family Education for ongoing care []  - 0 Staff obtains Programmer, systems, Records, Test Results / Process Orders []  - 0 Staff telephones HHA, Nursing Homes / Clarify orders / etc []  - 0 Routine Transfer to another Facility (non-emergent condition) []  - 0 Routine Hospital Admission (non-emergent condition) []  - 0 New Admissions / Biomedical engineer / Ordering NPWT, Apligraf, etc. []  - 0 Emergency Hospital Admission (emergent condition) X- 1 10 Simple Discharge Coordination ISHMEAL, RORIE ([id number]) []  - 0 Complex (extensive) Discharge Coordination PROCESS - Special Needs []  - Pediatric / Minor Patient Management 0 []  - 0 Isolation Patient Management []  - 0 Hearing / Language / Visual special needs []  - 0 Assessment of Community assistance (transportation, D/C planning, etc.) []  -  0 Additional assistance / Altered mentation []  - 0 Support Surface(s) Assessment (bed, cushion, seat, etc.) INTERVENTIONS - Wound Cleansing / Measurement []  - Simple Wound Cleansing - one wound 0 X-  2 5 Complex Wound Cleansing - multiple wounds X- 1 5 Wound Imaging (photographs - any number of wounds) []  - 0 Wound Tracing (instead of photographs) []  - 0 Simple Wound Measurement - one wound X- 2 5 Complex Wound Measurement - multiple wounds INTERVENTIONS - Wound Dressings X - Small Wound Dressing one or multiple wounds 1 10 []  - 0 Medium Wound Dressing one or multiple wounds []  - 0 Large Wound Dressing one or multiple wounds []  - 0 Application of Medications - topical []  - 0 Application of Medications - injection INTERVENTIONS - Miscellaneous []  - External ear exam 0 []  - 0 Specimen Collection (cultures, biopsies, blood, body fluids, etc.) []  - 0 Specimen(s) / Culture(s) sent or taken to Lab for analysis []  - 0 Patient Transfer (multiple staff / Civil Service fast streamer / Similar devices) []  - 0 Simple Staple / Suture removal (25 or less) []  - 0 Complex Staple / Suture removal (26 or more) []  - 0 Hypo / Hyperglycemic Management (close monitor of Blood Glucose) []  - 0 Ankle / Brachial Index (ABI) - do not check if billed separately X- 1 5 Vital Signs [name], [name]. ([id number]) Has the patient been seen at the hospital within the last three years: Yes Total Score: 85 Level Of Care: New/Established - Level 3 Electronic Signature(s) Signed: 07/20/2019 3:30:04 PM By: Army Melia Entered By: Army Melia on 07/20/2019 15:14:36 [name], [name] ([id number]) -------------------------------------------------------------------------------- Lower Extremity Assessment Details Patient Name: [name] Date of Service: 07/20/2019 2:45 PM Medical Record Number: [id number] Patient Account Number: [id number] Date of Birth/Sex: Nov 16, 1946 (73 y.o. M) Treating RN: Montey Hora Primary Care Ladasia Sircy: Dion Body Other Clinician: Referring Jelisa [place]: Dion Body Treating Avleen Bordwell/Extender: Melburn Hake, HOYT Weeks in Treatment: 2 Edema Assessment Assessed: [Left: No] [Right: No] Edema: [Left: No] [Right: No] Calf Left: Right: Point of Measurement: 32 cm From Medial Instep 34 cm 34 cm Ankle Left: Right: Point of Measurement: 11 cm From Medial Instep 23.5 cm 23 cm Vascular Assessment Pulses: Dorsalis Pedis Palpable: [Left:Yes] [Right:Yes] Electronic Signature(s) Signed: 07/20/2019 4:10:49 PM By: Montey Hora Entered By: Montey Hora on 07/20/2019 15:02:03 [name], [name] ([id number]) -------------------------------------------------------------------------------- Multi Wound Chart Details Patient Name: [name] Date of Service: 07/20/2019 2:45 PM Medical Record Number: [id number] Patient Account Number: [id number] Date of Birth/Sex: Sep 13, 1946 (73 y.o. M) Treating RN: Army Melia Primary Care Dekker Verga: Dion Body Other Clinician: Referring Daril Warga: Dion Body Treating Tej Murdaugh/Extender: Melburn Hake, HOYT Weeks in Treatment: 2 Vital Signs Height(in): 72 Pulse(bpm): 70 Weight(lbs): 245 Blood Pressure(mmHg): 128/54 Body Mass Index(BMI): 33 Temperature(F): 98.2 Respiratory Rate 18 (breaths/min): Wound Assessments Treatment Notes Electronic Signature(s) Signed: 07/20/2019 3:30:04 PM By: Army Melia Entered By: Army Melia on 07/20/2019 15:10:35 [place], [name] ([id number]) -------------------------------------------------------------------------------- [place] Details Patient Name: [name], [name]. Date of Service: 07/20/2019 2:45 PM Medical Record Number: [id number] Patient Account Number: [id number] Date of Birth/Sex: 1946/01/17 (74 y.o. M) Treating RN: Army Melia Primary Care Rockey Guarino: Dion Body Other Clinician: Referring Shyniece Scripter: Dion Body Treating  Jeena Arnett/Extender: Melburn Hake, HOYT Weeks in Treatment: 2 Active Inactive Electronic Signature(s) Signed: 07/20/2019 3:30:04 PM By: Army Melia Entered By: Army Melia on 07/20/2019 15:13:31 [name], [name] ([id number]) -------------------------------------------------------------------------------- Pain Assessment Details Patient Name: [name] Date of Service: 07/20/2019 2:45 PM Medical Record Number: [id number] Patient Account Number: [id number] Date of Birth/Sex: 12/09/1946 (73 y.o. M) Treating RN: Montey Hora Primary Care Reyhan Moronta: Dion Body Other Clinician: Referring Laiden Milles: Dion Body Treating Gaetan Spieker/Extender: Melburn Hake, HOYT Weeks in  Treatment: 2 Active Problems Location of Pain Severity and Description of Pain Patient Has Paino No Site Locations Pain Management and Medication Current Pain Management: Electronic Signature(s) Signed: 07/20/2019 4:10:49 PM By: Montey Hora Entered By: Montey Hora on 07/20/2019 14:54:27 [name], [name] ([id number]) -------------------------------------------------------------------------------- Patient/Caregiver Education Details Patient Name: [name], [name]. Date of Service: 07/20/2019 2:45 PM Medical Record Number: [id number] Patient Account Number: [id number] Date of Birth/Gender: 1946-09-25 (73 y.o. M) Treating RN: Army Melia Primary Care Physician: Dion Body Other Clinician: Referring Physician: Dion Body Treating Physician/Extender: Sharalyn Ink in Treatment: 2 Education Assessment Education Provided To: Patient Education Topics Provided Wound/Skin Impairment: Handouts: Caring for Your Ulcer Methods: Demonstration, Explain/Verbal Responses: State content correctly Electronic Signature(s) Signed: 07/20/2019 3:30:04 PM By: Army Melia Entered By: Army Melia on 07/20/2019 15:14:50 [name], [name]  ([id number]) -------------------------------------------------------------------------------- [place] Details Patient Name: [name] Date of Service: 07/20/2019 2:45 PM Medical Record Number: [id number] Patient Account Number: [id number] Date of Birth/Sex: 12-Sep-1946 (73 y.o. M) Treating RN: Montey Hora Primary Care Cheri Ayotte: Dion Body Other Clinician: Referring Tarek Cravens: Dion Body Treating Esmay Amspacher/Extender: Melburn Hake, HOYT Weeks in Treatment: 2 Vital Signs Time Taken: 14:55 Temperature (F): 98.2 Height (in): 72 Pulse (bpm): 70 Weight (lbs): 245 Respiratory Rate (breaths/min): 18 Body Mass Index (BMI): 33.2 Blood Pressure (mmHg): 128/54 Reference Range: 80 - 120 mg / dl Electronic Signature(s) Signed: 07/20/2019 4:10:49 PM By: Montey Hora Entered By: Montey Hora on 07/20/2019 14:55:55

## 2019-07-24 NOTE — Progress Notes (Signed)
Martin, [name] ([id number]) Visit Report for 07/20/2019 Chief Complaint Document Details Patient Name: [name], [name]. Date of Service: 07/20/2019 2:45 PM Medical Record Number: [id number] Patient Account Number: [id number] Date of Birth/Sex: 1946-12-17 (73 y.o. M) Treating RN: Army Melia Primary Care Provider: Dion Body Other Clinician: Referring Provider: Dion Body Treating Provider/Extender: Melburn Hake, HOYT Weeks in Treatment: 2 Information Obtained from: Patient Chief Complaint Bilateral LE edema Electronic Signature(s) Signed: 07/22/2019 12:16:19 AM By: Worthy Keeler PA-C Entered By: Worthy Keeler on 07/20/2019 14:36:31 Elman, Viviann Spare ([id number]) -------------------------------------------------------------------------------- HPI Details Patient Name: [name] Date of Service: 07/20/2019 2:45 PM Medical Record Number: [id number] Patient Account Number: [id number] Date of Birth/Sex: 01/02/1946 (73 y.o. M) Treating RN: Army Melia Primary Care Provider: Dion Body Other Clinician: Referring Provider: Dion Body Treating Provider/Extender: Melburn Hake, HOYT Weeks in Treatment: 2 History of Present Illness HPI Description: 07/06/19 but evaluation today patient patient presents for initial evaluation our clinic concerning issues that he's been having with his bilateral lower extremities for some time. He has actually appears to be doing rather well more recently although he does have a blister on the right anterior shin in several areas that appear to be showing signs of not being completely open but have recently been so. This is on the bilateral lower extremities. He does have lymphedema as well as being stasis according to what I'm seeing at this point. As previously been in [employer] with some good result. With that being said I do believe that based on what I'm seeing at this point he may benefit from a compression wrap  to squeeze out some of the fluid and then measure him for appropriate compression stockings size for him to pick something up at an elastic therapy in [place]. The patient is not opposed to this. The good news is there's no signs of infection and no obvious and significant wounds at this point. He does have a history of hypertension, he's had his right kidney removed secondary to cancer from what he tells me, and again does have lymphedema as well as chronic venous insufficiency. The good news is he has excellent APIs and seems to have great blood flow with good pulses. 07/14/19 on evaluation today patient actually appears to be doing well with regard to his lower extremity ulcers. In fact based on what I'm seeing at this point I feel like that the patient has done very well and again there does not appear to be any signs of active drainage new infection at this time which is excellent news. He did have a little bit of drainage on the wrap but again I feel like this was mainly due to some older areas that just hadn't completely sealed up. Nonetheless he hasn't gotten his compression stockings yet he does need these and I do think potentially wrapping him when one week before completely discharging him from wound care center would be a benefit for him. 07/20/19 on evaluation today patient appears to be doing very well at this point with regard to his lower extremities. There's no signs of active infection at this time there are no open wounds noted currently. Overall I'm very pleased with how his legs have done. He has ordered his compression stockings that have not arrived as of yet. Electronic Signature(s) Signed: 07/22/2019 12:16:19 AM By: Worthy Keeler PA-C Entered By: Worthy Keeler on 07/22/2019 00:11:45 Labella, Viviann Spare ([id number]) -------------------------------------------------------------------------------- Physical Exam Details Patient Name: Martin, [name]. Date  of Service:  07/20/2019 2:45 PM Medical Record Number: [id number] Patient Account Number: [id number] Date of Birth/Sex: [birth year] (73 y.o. M) Treating RN: Army Melia Primary Care Provider: Dion Body Other Clinician: Referring Provider: Dion Body Treating Provider/Extender: Melburn Hake, HOYT Weeks in Treatment: 2 Constitutional Well-nourished and well-hydrated in no acute distress. Respiratory normal breathing without difficulty. Psychiatric this patient is able to make decisions and demonstrates good insight into disease process. Alert and Oriented x 3. pleasant and cooperative. Notes Patient's wound bed currently showed evidence of good epithelialization at this time which is excellent news. Overall there are no open wounds noted currently. Electronic Signature(s) Signed: 07/22/2019 12:16:19 AM By: Worthy Keeler PA-C Entered By: Worthy Keeler on 07/22/2019 00:12:18 [name] ([id number]) -------------------------------------------------------------------------------- Physician Orders Details Patient Name: Martin, [name]. Date of Service: 07/20/2019 2:45 PM Medical Record Number: [id number] Patient Account Number: [id number] Date of Birth/Sex: 1946-03-21 (73 y.o. M) Treating RN: Army Melia Primary Care Provider: Dion Body Other Clinician: Referring Provider: Dion Body Treating Provider/Extender: Melburn Hake, HOYT Weeks in Treatment: 2 Verbal / Phone Orders: No Diagnosis Coding ICD-10 Coding Code Description I89.0 Lymphedema, not elsewhere classified E11.628 Type 2 diabetes mellitus with other skin complications K56.2 Venous insufficiency (chronic) (peripheral) I10 Essential (primary) hypertension Z90.5 Acquired absence of kidney Follow-up Appointments o Return Appointment in 1 week. Edema Control o Other: - double layer tubigrip F bilaterally Discharge From [facility] Services o Discharge from [place]  Signature(s) Signed: 07/20/2019 3:30:04 PM By: Army Melia Signed: 07/22/2019 12:16:19 AM By: Worthy Keeler PA-C Entered By: Army Melia on 07/20/2019 15:13:13 Tijerino, Viviann Spare ([id number]) -------------------------------------------------------------------------------- Problem List Details Patient Name: JESSEN, SIEGMAN. Date of Service: 07/20/2019 2:45 PM Medical Record Number: [id number] Patient Account Number: [id number] Date of Birth/Sex: November 26, 1946 (72 y.o. M) Treating RN: Army Melia Primary Care Provider: Dion Body Other Clinician: Referring Provider: Dion Body Treating Provider/Extender: Melburn Hake, HOYT Weeks in Treatment: 2 Active Problems ICD-10 Evaluated Encounter Code Description Active Date Today Diagnosis I89.0 Lymphedema, not elsewhere classified 07/06/2019 No Yes E11.628 Type 2 diabetes mellitus with other skin complications 02/02/2034 No Yes I87.2 Venous insufficiency (chronic) (peripheral) 07/06/2019 No Yes I10 Essential (primary) hypertension 07/06/2019 No Yes Z90.5 Acquired absence of kidney 07/06/2019 No Yes Inactive Problems Resolved Problems Electronic Signature(s) Signed: 07/22/2019 12:16:19 AM By: Worthy Keeler PA-C Entered By: Worthy Keeler on 07/20/2019 14:36:21 Sen, Viviann Spare ([id number]) -------------------------------------------------------------------------------- Progress Note Details Patient Name: [name] Date of Service: 07/20/2019 2:45 PM Medical Record Number: [id number] Patient Account Number: [id number] Date of Birth/Sex: 10-06-1946 (73 y.o. M) Treating RN: Army Melia Primary Care Provider: Dion Body Other Clinician: Referring Provider: Dion Body Treating Provider/Extender: Melburn Hake, HOYT Weeks in Treatment: 2 Subjective Chief Complaint Information obtained from Patient Bilateral LE edema History of Present Illness (HPI) 07/06/19 but evaluation today patient patient presents for initial  evaluation our clinic concerning issues that he's been having with his bilateral lower extremities for some time. He has actually appears to be doing rather well more recently although he does have a blister on the right anterior shin in several areas that appear to be showing signs of not being completely open but have recently been so. This is on the bilateral lower extremities. He does have lymphedema as well as being stasis according to what I'm seeing at this point. As previously been in [employer] with some good result. With that being said I do believe that based on what I'm seeing  at this point he may benefit from a compression wrap to squeeze out some of the fluid and then measure him for appropriate compression stockings size for him to pick something up at an elastic therapy in [place]. The patient is not opposed to this. The good news is there's no signs of infection and no obvious and significant wounds at this point. He does have a history of hypertension, he's had his right kidney removed secondary to cancer from what he tells me, and again does have lymphedema as well as chronic venous insufficiency. The good news is he has excellent APIs and seems to have great blood flow with good pulses. 07/14/19 on evaluation today patient actually appears to be doing well with regard to his lower extremity ulcers. In fact based on what I'm seeing at this point I feel like that the patient has done very well and again there does not appear to be any signs of active drainage new infection at this time which is excellent news. He did have a little bit of drainage on the wrap but again I feel like this was mainly due to some older areas that just hadn't completely sealed up. Nonetheless he hasn't gotten his compression stockings yet he does need these and I do think potentially wrapping him when one week before completely discharging him from wound care center would be a benefit for  him. 07/20/19 on evaluation today patient appears to be doing very well at this point with regard to his lower extremities. There's no signs of active infection at this time there are no open wounds noted currently. Overall I'm very pleased with how his legs have done. He has ordered his compression stockings that have not arrived as of yet. Patient History Information obtained from Patient. Family History Diabetes - Siblings, Heart Disease - Siblings, Hypertension - Siblings, No family history of Cancer, Hereditary Spherocytosis, Kidney Disease, Lung Disease, Seizures, Stroke, Thyroid Problems, Tuberculosis. Social History Former smoker - quit 50+ years ago, Marital Status - Married, Alcohol Use - Rarely, Drug Use - No History, Caffeine Use - Daily. Medical History Eyes Denies history of Cataracts, Glaucoma, Optic Neuritis Cardiovascular Patient has history of Hypertension MOHID, FURUYA ([id number]) Denies history of Angina, Arrhythmia, Congestive Heart Failure, Coronary Artery Disease, Deep Vein Thrombosis, Hypotension, Myocardial Infarction, Peripheral Arterial Disease, Peripheral Venous Disease, Phlebitis, Vasculitis Endocrine Patient has history of Type II Diabetes Denies history of Type I Diabetes Genitourinary Denies history of End Stage Renal Disease Integumentary (Skin) Denies history of History of Burn, History of pressure wounds Musculoskeletal Patient has history of Osteoarthritis Denies history of Gout, Rheumatoid Arthritis, Osteomyelitis Oncologic Denies history of Received Chemotherapy, Received Radiation Medical And Surgical History Notes 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 Respiratory Denies complaints or  symptoms of Chronic or frequent coughs, Shortness of Breath. Cardiovascular Denies complaints or symptoms of Chest pain, LE edema. Psychiatric Denies complaints or symptoms of Anxiety, Claustrophobia. Objective Constitutional Well-nourished and well-hydrated in no acute distress. Vitals Time Taken: 2:55 PM, Height: 72 in, Weight: 245 lbs, BMI: 33.2, Temperature: 98.2 F, Pulse: 70 bpm, Respiratory Rate: 18 breaths/min, Blood Pressure: 128/54 mmHg. Respiratory normal breathing without difficulty. Psychiatric this patient is able to make decisions and demonstrates good insight into disease process. Alert and Oriented x 3. pleasant and  cooperative. General Notes: Patient's wound bed currently showed evidence of good epithelialization at this time which is excellent news. JEP, DYAS ([id number]) Overall there are no open wounds noted currently. Assessment Active Problems ICD-10 Lymphedema, not elsewhere classified Type 2 diabetes mellitus with other skin complications Venous insufficiency (chronic) (peripheral) Essential (primary) hypertension Acquired absence of kidney Plan Follow-up Appointments: Return Appointment in 1 week. Edema Control: Other: - double layer tubigrip F bilaterally Discharge From [facility] Services: Discharge from [place] recommend that we discontinue wound care services at this point since the patient appears to be doing so well. If anything changes or worsens in the future you can always contact the office and let me know. Otherwise will see were things stand at follow-up. That will be as needed. Electronic Signature(s) Signed: 07/22/2019 12:16:19 AM By: Worthy Keeler PA-C Entered By: Worthy Keeler on 07/22/2019 00:12:30 Mow, Viviann Spare ([id number]) -------------------------------------------------------------------------------- ROS/PFSH Details Patient Name: RONDEL, EPISCOPO. Date of Service: 07/20/2019 2:45 PM Medical Record  Number: [id number] Patient Account Number: [id number] Date of Birth/Sex: 1946/08/10 (73 y.o. M) Treating RN: Army Melia Primary Care Provider: Dion Body Other Clinician: Referring Provider: Dion Body Treating Provider/Extender: Melburn Hake, HOYT Weeks in Treatment: 2 Information Obtained From Patient Constitutional Symptoms (General Health) Complaints and Symptoms: Negative for: Fatigue; Fever; Chills; Marked Weight Change Respiratory Complaints and Symptoms: Negative for: Chronic or frequent coughs; Shortness of Breath Cardiovascular Complaints and Symptoms: Negative for: Chest pain; LE edema Medical History: Positive for: Hypertension Negative for: Angina; Arrhythmia; Congestive Heart Failure; Coronary Artery Disease; Deep Vein Thrombosis; Hypotension; Myocardial Infarction; Peripheral Arterial Disease; Peripheral Venous Disease; Phlebitis; Vasculitis Psychiatric Complaints and Symptoms: Negative for: Anxiety; Claustrophobia Eyes Medical History: Negative for: Cataracts; Glaucoma; Optic Neuritis Past Medical History Notes: blind in left eye Endocrine Medical History: Positive for: Type II Diabetes Negative for: Type I Diabetes Treated with: Insulin, Oral agents Blood sugar tested every day: Yes Tested : BID Genitourinary Medical History: Negative for: End Stage Renal Disease Past Medical History Notes: right kidney has been removed Stammer, Viviann Spare ([id number]) Integumentary (Skin) Medical History: Negative for: History of Burn; History of pressure wounds Musculoskeletal Medical History: Positive for: Osteoarthritis Negative for: Gout; Rheumatoid Arthritis; Osteomyelitis Oncologic Medical History: Negative for: Received Chemotherapy; Received Radiation Past Medical History Notes: renal cancer - removed right kidney; throat cancer; bladder cancer Immunizations Pneumococcal Vaccine: Received Pneumococcal Vaccination: No Implantable  Devices None Family and Social History Cancer: No; Diabetes: Yes - Siblings; Heart Disease: Yes - Siblings; Hereditary Spherocytosis: No; Hypertension: Yes - Siblings; Kidney Disease: No; Lung Disease: No; Seizures: No; Stroke: No; Thyroid Problems: No; Tuberculosis: No; Former smoker - quit 50+ years ago; Marital Status - Married; Alcohol Use: Rarely; Drug Use: No History; Caffeine Use: Daily; Financial Concerns: No; Food, Clothing or Shelter Needs: No; Support System Lacking: No; Transportation Concerns: No Physician Affirmation I have reviewed and agree with the above information. Electronic Signature(s) Signed: 07/22/2019 12:16:19 AM By: Worthy Keeler PA-C Signed: 07/24/2019 11:41:51 AM By: Army Melia Entered By: Worthy Keeler on 07/22/2019 00:12:06 [name] ([id number]) -------------------------------------------------------------------------------- SuperBill Details Patient Name: DANYL, DEEMS. Date of Service: 07/20/2019 Medical Record Number: [id number] Patient Account Number: [id number] Date of Birth/Sex: December 08, 1946 (73 y.o. M) Treating RN: Army Melia Primary Care Provider: Dion Body Other Clinician: Referring Provider: Dion Body Treating Provider/Extender: Melburn Hake, HOYT Weeks in Treatment: 2 Diagnosis Coding ICD-10 Codes Code Description I89.0 Lymphedema, not elsewhere classified E11.628 Type 2 diabetes mellitus with  other skin complications E23.3 Venous insufficiency (chronic) (peripheral) I10 Essential (primary) hypertension Z90.5 Acquired absence of kidney Facility Procedures CPT4 Code: [id number] Description: 53005 - WOUND CARE VISIT-LEV 3 EST PT Modifier: Quantity: 1 Physician Procedures CPT4 Code: 1102111 Description: 73567 - WC PHYS LEVEL 3 - EST PT ICD-10 Diagnosis Description I89.0 Lymphedema, not elsewhere classified E11.628 Type 2 diabetes mellitus with other skin complications O14.1 Venous insufficiency (chronic)  (peripheral) I10 Essential (primary)  hypertension Modifier: Quantity: 1 Electronic Signature(s) Signed: 07/22/2019 12:16:19 AM By: Worthy Keeler PA-C Entered By: Worthy Keeler on 07/20/2019 17:19:54

## 2019-08-03 ENCOUNTER — Encounter: Payer: PPO | Attending: Physician Assistant | Admitting: Physician Assistant

## 2019-08-03 ENCOUNTER — Other Ambulatory Visit: Payer: Self-pay

## 2019-08-03 DIAGNOSIS — H5462 Unqualified visual loss, left eye, normal vision right eye: Secondary | ICD-10-CM | POA: Insufficient documentation

## 2019-08-03 DIAGNOSIS — M1990 Unspecified osteoarthritis, unspecified site: Secondary | ICD-10-CM | POA: Diagnosis not present

## 2019-08-03 DIAGNOSIS — E11628 Type 2 diabetes mellitus with other skin complications: Secondary | ICD-10-CM | POA: Insufficient documentation

## 2019-08-03 DIAGNOSIS — Z8551 Personal history of malignant neoplasm of bladder: Secondary | ICD-10-CM | POA: Diagnosis not present

## 2019-08-03 DIAGNOSIS — Z85528 Personal history of other malignant neoplasm of kidney: Secondary | ICD-10-CM | POA: Diagnosis not present

## 2019-08-03 DIAGNOSIS — Z881 Allergy status to other antibiotic agents status: Secondary | ICD-10-CM | POA: Diagnosis not present

## 2019-08-03 DIAGNOSIS — E1151 Type 2 diabetes mellitus with diabetic peripheral angiopathy without gangrene: Secondary | ICD-10-CM | POA: Insufficient documentation

## 2019-08-03 DIAGNOSIS — I890 Lymphedema, not elsewhere classified: Secondary | ICD-10-CM | POA: Insufficient documentation

## 2019-08-03 DIAGNOSIS — I10 Essential (primary) hypertension: Secondary | ICD-10-CM | POA: Insufficient documentation

## 2019-08-03 DIAGNOSIS — Z87891 Personal history of nicotine dependence: Secondary | ICD-10-CM | POA: Insufficient documentation

## 2019-08-03 DIAGNOSIS — L988 Other specified disorders of the skin and subcutaneous tissue: Secondary | ICD-10-CM | POA: Diagnosis not present

## 2019-08-03 DIAGNOSIS — Z8249 Family history of ischemic heart disease and other diseases of the circulatory system: Secondary | ICD-10-CM | POA: Insufficient documentation

## 2019-08-03 DIAGNOSIS — Z905 Acquired absence of kidney: Secondary | ICD-10-CM | POA: Diagnosis not present

## 2019-08-03 NOTE — Progress Notes (Signed)
HURBERT, DURAN ([id number]) Visit Report for 08/03/2019 Arrival Information Details Patient Name: [name], [name]. Date of Service: 08/03/2019 3:15 PM Medical Record Number: [id number] Patient Account Number: [id number] Date of Birth/Sex: 01/04/1946 (73 y.o. M) Treating RN: Montey Hora Primary Care Kandie Keiper: Dion Body Other Clinician: Referring Noella Kipnis: Dion Body Treating Carlos Heber/Extender: Melburn Hake, HOYT Weeks in Treatment: 4 Visit Information History Since Last Visit Added or deleted any medications: No Patient Arrived: Ambulatory Any new allergies or adverse reactions: No Arrival Time: 15:16 Had a fall or experienced change in No Accompanied By: self activities of daily living that may affect Transfer Assistance: None risk of falls: Patient Identification Verified: Yes Signs or symptoms of abuse/neglect since last visito No Secondary Verification Process Completed: Yes Hospitalized since last visit: No Patient Has Alerts: Yes Implantable device outside of the clinic excluding No Patient Alerts: DMII cellular tissue based products placed in the center since last visit: Has Compression in Place as Prescribed: Yes Pain Present Now: No Electronic Signature(s) Signed: 08/03/2019 4:34:48 PM By: Montey Hora Entered By: Montey Hora on 08/03/2019 15:18:34 Wogan, Viviann Spare ([id number]) -------------------------------------------------------------------------------- Compression Therapy Details Patient Name: [name] Date of Service: 08/03/2019 3:15 PM Medical Record Number: [id number] Patient Account Number: [id number] Date of Birth/Sex: [birth year] (73 y.o. M) Treating RN: Army Melia Primary Care Tuan Tippin: Dion Body Other Clinician: Referring Jeziel Hoffmann: Dion Body Treating Severus Brodzinski/Extender: Melburn Hake, HOYT Weeks in Treatment: 4 Compression Therapy Performed for Wound Assessment: NonWound Condition Lymphedema - Bilateral  Leg Performed By: Clinician Army Melia, RN Compression Type: Three Layer Pre Treatment ABI: 1 Post Procedure Diagnosis Same as Pre-procedure Electronic Signature(s) Signed: 08/03/2019 4:44:10 PM By: Army Melia Entered By: Army Melia on 08/03/2019 15:57:33 Lamora, Viviann Spare ([id number]) -------------------------------------------------------------------------------- Encounter Discharge Information Details Patient Name: [name], [name]. Date of Service: 08/03/2019 3:15 PM Medical Record Number: [id number] Patient Account Number: [id number] Date of Birth/Sex: 1946/08/31 (73 y.o. M) Treating RN: Army Melia Primary Care Shiquita Collignon: Dion Body Other Clinician: Referring Cecilie Heidel: Dion Body Treating Lovinia Snare/Extender: Melburn Hake, HOYT Weeks in Treatment: 4 Encounter Discharge Information Items Discharge Condition: Stable Ambulatory Status: Ambulatory Discharge Destination: Home Transportation: Private Auto Accompanied By: self Schedule Follow-up Appointment: Yes Clinical Summary of Care: Electronic Signature(s) Signed: 08/03/2019 4:44:10 PM By: Army Melia Entered By: Army Melia on 08/03/2019 16:00:11 Behanna, Viviann Spare ([id number]) -------------------------------------------------------------------------------- Lower Extremity Assessment Details Patient Name: [name], [name]. Date of Service: 08/03/2019 3:15 PM Medical Record Number: [id number] Patient Account Number: [id number] Date of Birth/Sex: January 02, 1946 (73 y.o. M) Treating RN: Montey Hora Primary Care Dasie Chancellor: Dion Body Other Clinician: Referring Sukari Grist: Dion Body Treating Ridley Schewe/Extender: Melburn Hake, HOYT Weeks in Treatment: 4 Edema Assessment Assessed: [Left: No] [Right: No] Edema: [Left: Yes] [Right: Yes] Calf Left: Right: Point of Measurement: 32 cm From Medial Instep 34.5 cm 35 cm Ankle Left: Right: Point of Measurement: 11 cm From Medial Instep 24 cm 24 cm Vascular  Assessment Pulses: Dorsalis Pedis Palpable: [Left:Yes] [Right:Yes] Electronic Signature(s) Signed: 08/03/2019 4:34:48 PM By: Montey Hora Entered By: Montey Hora on 08/03/2019 15:24:33 Taborda, Viviann Spare ([id number]) -------------------------------------------------------------------------------- Multi Wound Chart Details Patient Name: [name] Date of Service: 08/03/2019 3:15 PM Medical Record Number: [id number] Patient Account Number: [id number] Date of Birth/Sex: 04/12/1946 (73 y.o. M) Treating RN: Army Melia Primary Care Kathyrn Warmuth: Dion Body Other Clinician: Referring Christyl Osentoski: Dion Body Treating Altair Appenzeller/Extender: Melburn Hake, HOYT Weeks in Treatment: 4 Vital Signs Height(in): 72 Pulse(bpm): 83 Weight(lbs): 245 Blood Pressure(mmHg): 167/64 Body Mass Index(BMI): 33 Temperature(F): 97.9 Respiratory Rate 16 (  breaths/min): Wound Assessments Treatment Notes Electronic Signature(s) Signed: 08/03/2019 4:44:10 PM By: Army Melia Entered By: Army Melia on 08/03/2019 15:54:48 Franqui, Viviann Spare ([id number]) -------------------------------------------------------------------------------- Onset Details Patient Name: [name], [name]. Date of Service: 08/03/2019 3:15 PM Medical Record Number: [id number] Patient Account Number: [id number] Date of Birth/Sex: 06/01/1946 (73 y.o. M) Treating RN: Army Melia Primary Care Viona Hosking: Dion Body Other Clinician: Referring Saryna Kneeland: Dion Body Treating Zackerie Sara/Extender: Melburn Hake, HOYT Weeks in Treatment: 4 Active Inactive Electronic Signature(s) Signed: 08/03/2019 4:44:10 PM By: Army Melia Entered By: Army Melia on 08/03/2019 15:53:35 Przybylski, Viviann Spare ([id number]) -------------------------------------------------------------------------------- Non-Wound Condition Assessment Details Patient Name: [name], [name]. Date of Service: 08/03/2019 3:15 PM Medical Record  Number: [id number] Patient Account Number: [id number] Date of Birth/Sex: 08-26-1946 (73 y.o. M) Treating RN: Montey Hora Primary Care Amani Nodarse: Dion Body Other Clinician: Referring Nicolena Schurman: Dion Body Treating Taryn Nave/Extender: Melburn Hake, HOYT Weeks in Treatment: 4 Non-Wound Condition: Condition: Lymphedema Location: Leg Side: Bilateral Photos Electronic Signature(s) Signed: 08/03/2019 4:34:48 PM By: Montey Hora Entered By: Montey Hora on 08/03/2019 15:28:00 Hiscox, Viviann Spare ([id number]) -------------------------------------------------------------------------------- Non-Wound Condition Assessment Details Patient Name: [name], [name]. Date of Service: 08/03/2019 3:15 PM Medical Record Number: [id number] Patient Account Number: [id number] Date of Birth/Sex: 07-18-1946 (73 y.o. M) Treating RN: Montey Hora Primary Care Kateena Degroote: Dion Body Other Clinician: Referring Euretha Najarro: Dion Body Treating Inice Sanluis/Extender: Melburn Hake, HOYT Weeks in Treatment: 4 Non-Wound Condition: Condition: Other Dermatologic Condition Location: Leg Side: Left Photos Notes intact blister on left lateral lower leg Electronic Signature(s) Signed: 08/03/2019 4:34:48 PM By: Montey Hora Entered By: Montey Hora on 08/03/2019 15:28:56 Agredano, Viviann Spare ([id number]) -------------------------------------------------------------------------------- Pain Assessment Details Patient Name: [name] Date of Service: 08/03/2019 3:15 PM Medical Record Number: [id number] Patient Account Number: [id number] Date of Birth/Sex: 05/09/1946 (73 y.o. M) Treating RN: Montey Hora Primary Care Demiah Gullickson: Dion Body Other Clinician: Referring Kylynn Street: Dion Body Treating Ellee Wawrzyniak/Extender: Melburn Hake, HOYT Weeks in Treatment: 4 Active Problems Location of Pain Severity and Description of Pain Patient Has Paino Yes Site Locations Pain  Location: Generalized Pain Pain Management and Medication Current Pain Management: Electronic Signature(s) Signed: 08/03/2019 4:34:48 PM By: Montey Hora Entered By: Montey Hora on 08/03/2019 15:20:48 Franzel, Viviann Spare ([id number]) -------------------------------------------------------------------------------- Patient/Caregiver Education Details Patient Name: [name], [name]. Date of Service: 08/03/2019 3:15 PM Medical Record Number: [id number] Patient Account Number: [id number] Date of Birth/Gender: 12-Feb-1946 (73 y.o. M) Treating RN: Army Melia Primary Care Physician: Dion Body Other Clinician: Referring Physician: Dion Body Treating Physician/Extender: Sharalyn Ink in Treatment: 4 Education Assessment Education Provided To: Patient Education Topics Provided Wound/Skin Impairment: Handouts: Caring for Your Ulcer Methods: Demonstration, Explain/Verbal Responses: State content correctly Electronic Signature(s) Signed: 08/03/2019 4:44:10 PM By: Army Melia Entered By: Army Melia on 08/03/2019 15:59:14 Rickey, Viviann Spare ([id number]) -------------------------------------------------------------------------------- [place] Details Patient Name: [name] Date of Service: 08/03/2019 3:15 PM Medical Record Number: [id number] Patient Account Number: [id number] Date of Birth/Sex: 03-10-1946 (73 y.o. M) Treating RN: Montey Hora Primary Care Severus Brodzinski: Dion Body Other Clinician: Referring Haydon Kalmar: Dion Body Treating Roseana Rhine/Extender: Melburn Hake, HOYT Weeks in Treatment: 4 Vital Signs Time Taken: 15:20 Temperature (F): 97.9 Height (in): 72 Pulse (bpm): 83 Weight (lbs): 245 Respiratory Rate (breaths/min): 16 Body Mass Index (BMI): 33.2 Blood Pressure (mmHg): 167/64 Reference Range: 80 - 120 mg / dl Electronic Signature(s) Signed: 08/03/2019 4:34:48 PM By: Montey Hora Entered By: Montey Hora on 08/03/2019 15:21:12

## 2019-08-03 NOTE — Progress Notes (Addendum)
PEDROHENRIQUE, [name] ([id number]) Visit Report for 08/03/2019 Chief Complaint Document Details Patient Name: [name], [name]. Date of Service: 08/03/2019 3:15 PM Medical Record Number: [id number] Patient Account Number: [id number] Date of Birth/Sex: 12-09-1946 (73 y.o. M) Treating RN: Army Melia Primary Care Provider: Dion Body Other Clinician: Referring Provider: Dion Body Treating Provider/Extender: Melburn Hake, Tayli Buch Weeks in Treatment: 4 Information Obtained from: Patient Chief Complaint Bilateral LE edema Electronic Signature(s) Signed: 08/03/2019 3:45:13 PM By: Worthy Keeler PA-C Entered By: Worthy Keeler on 08/03/2019 15:45:13 Bloxom, Viviann Spare ([id number]) -------------------------------------------------------------------------------- HPI Details Patient Name: [name] Date of Service: 08/03/2019 3:15 PM Medical Record Number: [id number] Patient Account Number: [id number] Date of Birth/Sex: 1946/08/15 (73 y.o. M) Treating RN: Army Melia Primary Care Provider: Dion Body Other Clinician: Referring Provider: Dion Body Treating Provider/Extender: Melburn Hake, Cayenne Breault Weeks in Treatment: 4 History of Present Illness HPI Description: 07/06/19 but evaluation today patient patient presents for initial evaluation our clinic concerning issues that he's been having with his bilateral lower extremities for some time. He has actually appears to be doing rather well more recently although he does have a blister on the right anterior shin in several areas that appear to be showing signs of not being completely open but have recently been so. This is on the bilateral lower extremities. He does have lymphedema as well as being stasis according to what I'm seeing at this point. As previously been in [employer] with some good result. With that being said I do believe that based on what I'm seeing at this point he may benefit from a compression wrap  to squeeze out some of the fluid and then measure him for appropriate compression stockings size for him to pick something up at an elastic therapy in [place]. The patient is not opposed to this. The good news is there's no signs of infection and no obvious and significant wounds at this point. He does have a history of hypertension, he's had his right kidney removed secondary to cancer from what he tells me, and again does have lymphedema as well as chronic venous insufficiency. The good news is he has excellent APIs and seems to have great blood flow with good pulses. 07/14/19 on evaluation today patient actually appears to be doing well with regard to his lower extremity ulcers. In fact based on what I'm seeing at this point I feel like that the patient has done very well and again there does not appear to be any signs of active drainage new infection at this time which is excellent news. He did have a little bit of drainage on the wrap but again I feel like this was mainly due to some older areas that just hadn't completely sealed up. Nonetheless he hasn't gotten his compression stockings yet he does need these and I do think potentially wrapping him when one week before completely discharging him from wound care center would be a benefit for him. 07/20/19 on evaluation today patient appears to be doing very well at this point with regard to his lower extremities. There's no signs of active infection at this time there are no open wounds noted currently. Overall I'm very pleased with how his legs have done. He has ordered his compression stockings that have not arrived as of yet. 08/03/19 upon evaluation today for patient presents for reevaluation of our clinic although I had just discharged him from wound care services just a couple weeks ago. He has been wearing the compression  stockings that he ordered and seem to be doing okay until he began to develop several blister areas unfortunately. There  does not appear to be any signs of active infection at this time which is good news. With that being said the blistering is mostly on the left lower extremity although he has some areas on the right there's also some region of your theme I noted today. Electronic Signature(s) Signed: 08/04/2019 2:47:02 AM By: Worthy Keeler PA-C Entered By: Worthy Keeler on 08/03/2019 22:47:22 [name] ([id number]) -------------------------------------------------------------------------------- Physical Exam Details Patient Name: Martin, [name]. Date of Service: 08/03/2019 3:15 PM Medical Record Number: [id number] Patient Account Number: [id number] Date of Birth/Sex: October 22, 1946 (73 y.o. M) Treating RN: Army Melia Primary Care Provider: Dion Body Other Clinician: Referring Provider: Dion Body Treating Provider/Extender: Melburn Hake, Branae Crail Weeks in Treatment: 4 Constitutional Well-nourished and well-hydrated in no acute distress. Respiratory normal breathing without difficulty. clear to auscultation bilaterally. Cardiovascular regular rate and rhythm with normal S1, S2. Psychiatric this patient is able to make decisions and demonstrates good insight into disease process. Alert and Oriented x 3. pleasant and cooperative. Notes Upon inspection patient actually has issues with several blister areas over his left lower extremity in particular. The right lower extremity doesn't appear to be to bad right now which is good news although he did have a couple small areas in question. Overall I do believe that he will do well with the compression wraps which previously got things under very good control for him. His compression stockings there do seem to be doing a good job as far as controlling his edema. Electronic Signature(s) Signed: 08/04/2019 2:47:02 AM By: Worthy Keeler PA-C Entered By: Worthy Keeler on 08/03/2019 22:54:39 Vera, Viviann Spare  ([id number]) -------------------------------------------------------------------------------- Physician Orders Details Patient Name: [name], [name]. Date of Service: 08/03/2019 3:15 PM Medical Record Number: [id number] Patient Account Number: [id number] Date of Birth/Sex: 02-20-1946 (73 y.o. 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 Edema Control o 3 Layer Compression System - Bilateral Electronic Signature(s) Signed: 08/03/2019 4:44:10 PM By: Army Melia Signed: 08/04/2019 2:47:02 AM By: Worthy Keeler PA-C Entered By: Army Melia on 08/03/2019 15:58:44 Poitra, Viviann Spare ([id number]) -------------------------------------------------------------------------------- Problem List Details Patient Name: IDRISSA, BEVILLE. Date of Service: 08/03/2019 3:15 PM Medical Record Number: [id number] Patient Account Number: [id number] Date of Birth/Sex: 08/24/1946 (73 y.o. M) Treating RN: Army Melia Primary Care Provider: Dion Body Other Clinician: Referring Provider: Dion Body Treating Provider/Extender: Melburn Hake, Hyde Sires Weeks in Treatment: 4 Active Problems ICD-10 Evaluated Encounter Code Description Active Date Today Diagnosis I89.0 Lymphedema, not elsewhere classified 07/06/2019 No Yes E11.628 Type 2 diabetes mellitus  with other skin complications 03/31/9674 No Yes I87.2 Venous insufficiency (chronic) (peripheral) 07/06/2019 No Yes I10 Essential (primary) hypertension 07/06/2019 No Yes Z90.5 Acquired absence of kidney 07/06/2019 No Yes Inactive Problems Resolved Problems Electronic Signature(s) Signed: 08/03/2019 3:45:07 PM By: Worthy Keeler PA-C Entered By: Worthy Keeler on 08/03/2019 15:45:06 Micek,  Viviann Spare ([id number]) -------------------------------------------------------------------------------- Progress Note Details Patient Name: ALIJAH, AKRAM. Date of Service: 08/03/2019 3:15 PM Medical Record Number: [id number] Patient Account Number: [id number] Date of Birth/Sex: May 04, 1946 (73 y.o. M) Treating RN: Army Melia Primary Care Provider: Dion Body Other Clinician: Referring Provider: Dion Body Treating Provider/Extender: Melburn Hake, Kue Fox Weeks in Treatment: 4 Subjective Chief Complaint Information obtained from Patient Bilateral LE edema History of Present Illness (HPI) 07/06/19 but evaluation today patient patient presents for initial evaluation our clinic concerning issues that he's been having with his bilateral lower extremities for some time. He has actually appears to be doing rather well more recently although he does have a blister on the right anterior shin in several areas that appear to be showing signs of not being completely open but have recently been so. This is on the bilateral lower extremities. He does have lymphedema as well as being stasis according to what I'm seeing at this point. As previously been in [employer] with some good result. With that being said I do believe that based on what I'm seeing at this point he may benefit from a compression wrap to squeeze out some of the fluid and then measure him for appropriate compression stockings size for him to pick something up at an elastic therapy in [place]. The patient is not opposed to this. The good  news is there's no signs of infection and no obvious and significant wounds at this point. He does have a history of hypertension, he's had his right kidney removed secondary to cancer from what he tells me, and again does have lymphedema as well as chronic venous insufficiency. The good news is he has excellent APIs and seems to have great blood flow with good pulses. 07/14/19 on evaluation today patient actually appears to be doing well with regard to his lower extremity ulcers. In fact based on what I'm seeing at this point I feel like that the patient has done very well and again there does not appear to be any signs of active drainage new infection at this time which is excellent news. He did have a little bit of drainage on the wrap but again I feel like this was mainly due to some older areas that just hadn't completely sealed up. Nonetheless he hasn't gotten his compression stockings yet he does need these and I do think potentially wrapping him when one week before completely discharging him from wound care center would be a benefit for him. 07/20/19 on evaluation today patient appears to be doing very well at this point with regard to his lower extremities. There's no signs of active infection at this time there are no open wounds noted currently. Overall I'm very pleased with how his legs have done. He has ordered his compression stockings that have not arrived as of yet. 08/03/19 upon evaluation today for patient presents for reevaluation of our clinic although I had just discharged him from wound care services just a couple weeks ago. He has been wearing the compression stockings that he ordered and seem to be doing okay until he began to develop several blister areas unfortunately. There does not appear to be any signs of active infection at this time which is good news. With that being said the blistering is mostly on the left lower extremity although he has some areas on the right  there's also some region of your theme I noted today. Patient History Information obtained from Patient. Family History Diabetes - Siblings, Heart  Disease - Siblings, Hypertension - Siblings, No family history of Cancer, Hereditary Spherocytosis, Kidney Disease, Lung Disease, Seizures, Stroke, Thyroid Problems, Tuberculosis. Social History Former smoker - quit 50+ years ago, Marital Status - Married, Alcohol Use - Rarely, Drug Use - No History, Caffeine Use - Daily. SHAYLON, ADEN ([id number]) Medical History Eyes Denies history of Cataracts, Glaucoma, Optic Neuritis Cardiovascular Patient has history of Hypertension Denies history of Angina, Arrhythmia, Congestive Heart Failure, Coronary Artery Disease, Deep Vein Thrombosis, Hypotension, Myocardial Infarction, Peripheral Arterial Disease, Peripheral Venous Disease, Phlebitis, Vasculitis Endocrine Patient has history of Type II Diabetes Denies history of Type I Diabetes Genitourinary Denies history of End Stage Renal Disease Integumentary (Skin) Denies history of History of Burn, History of pressure wounds Musculoskeletal Patient has history of Osteoarthritis Denies history of Gout, Rheumatoid Arthritis, Osteomyelitis Oncologic Denies history of Received Chemotherapy, Received Radiation Medical And Surgical History Notes 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 Respiratory Denies complaints or symptoms of Chronic or frequent coughs, Shortness of Breath. Cardiovascular Complains or has symptoms of LE edema. Denies complaints or symptoms of Chest pain. Psychiatric Denies complaints or symptoms of Anxiety, Claustrophobia. Objective Constitutional Well-nourished and  well-hydrated in no acute distress. Vitals Time Taken: 3:20 PM, Height: 72 in, Weight: 245 lbs, BMI: 33.2, Temperature: 97.9 F, Pulse: 83 bpm, Respiratory Rate: 16 breaths/min, Blood Pressure: 167/64 mmHg. Respiratory normal breathing without difficulty. clear to auscultation bilaterally. MARQUIZ, SOTELO ([id number]) Cardiovascular regular rate and rhythm with normal S1, S2. Psychiatric this patient is able to make decisions and demonstrates good insight into disease process. Alert and Oriented x 3. pleasant and cooperative. General Notes: Upon inspection patient actually has issues with several blister areas over his left lower extremity in particular. The right lower extremity doesn't appear to be to bad right now which is good news although he did have a couple small areas in question. Overall I do believe that he will do well with the compression wraps which previously got things under very good control for him. His compression stockings there do seem to be doing a good job as far as controlling his edema. Other Condition(s) Patient presents with Lymphedema located on the Bilateral Leg. Patient presents with Other Dermatologic Condition located on the Left Leg. 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 Post procedure Diagnosis Wound #: Same as Pre-Procedure Plan Primary Wound Dressing: Silver Alginate - TCA on rash areas Secondary Dressing: ABD pad Dressing Change Frequency: Change dressing every week Follow-up Appointments: KORAY, SOTER ([id number]) Return Appointment in 1 week. Edema Control: 3 Layer  Compression System - Bilateral 1. My initial recommendation is that we utilize silver out and it to the wound beds to help with any drainage the patient is in agreement with that plan. 2. I'm gonna suggest that we go ahead and reinitiate the bilateral three layer compression wraps is the seem to be beneficial for him in the past. 3. No sharp debridement was necessary today which is good news. With that being said I will keep an  eye on how things seem to progress and if any debridement is necessary we will initiated as needed. Please see above for specific wound care orders. We will see patient for re-evaluation in 1 week(s) here in the clinic. If anything worsens or changes patient will contact our office for additional recommendations. Electronic Signature(s) Signed: 08/04/2019 2:47:02 AM By: Worthy Keeler PA-C Entered By: Worthy Keeler on 08/03/2019 22:55:54 Guerrier, Viviann Spare ([id number]) -------------------------------------------------------------------------------- ROS/PFSH Details Patient Name: ROBERT, SPERL. Date of Service: 08/03/2019 3:15 PM Medical Record Number: [id number] Patient Account Number: [id number] Date of Birth/Sex: 16-Jul-1946 (73 y.o. M) Treating RN: Army Melia Primary Care Provider: Dion Body Other Clinician: Referring Provider: Dion Body Treating Provider/Extender: Melburn Hake, Nuchem Grattan Weeks in Treatment: 4 Information Obtained From Patient Constitutional Symptoms (General Health) Complaints and Symptoms: Negative for: Fatigue; Fever; Chills; Marked Weight Change Respiratory Complaints and Symptoms: Negative for: Chronic or frequent coughs; Shortness of Breath Cardiovascular Complaints and Symptoms: Positive for: LE edema Negative for: Chest pain Medical History: Positive for: Hypertension Negative for: Angina; Arrhythmia; Congestive Heart Failure; Coronary Artery Disease; Deep Vein Thrombosis; Hypotension; Myocardial Infarction;  Peripheral Arterial Disease; Peripheral Venous Disease; Phlebitis; Vasculitis Psychiatric Complaints and Symptoms: Negative for: Anxiety; Claustrophobia Eyes Medical History: Negative for: Cataracts; Glaucoma; Optic Neuritis Past Medical History Notes: blind in left eye Endocrine Medical History: Positive for: Type II Diabetes Negative for: Type I Diabetes Treated with: Insulin, Oral agents Blood sugar tested every day: Yes Tested : BID Genitourinary Medical History: Negative for: End Stage Renal Disease Past Medical History Notes: JAMARRIUS, SALAY ([id number]) right kidney has been removed Integumentary (Skin) Medical History: Negative for: History of Burn; History of pressure wounds Musculoskeletal Medical History: Positive for: Osteoarthritis Negative for: Gout; Rheumatoid Arthritis; Osteomyelitis Oncologic Medical History: Negative for: Received Chemotherapy; Received Radiation Past Medical History Notes: renal cancer - removed right kidney; throat cancer; bladder cancer Immunizations Pneumococcal Vaccine: Received Pneumococcal Vaccination: No Implantable Devices None Family and Social History Cancer: No; Diabetes: Yes - Siblings; Heart Disease: Yes - Siblings; Hereditary Spherocytosis: No; Hypertension: Yes - Siblings; Kidney Disease: No; Lung Disease: No; Seizures: No; Stroke: No; Thyroid Problems: No; Tuberculosis: No; Former smoker - quit 50+ years ago; Marital Status - Married; Alcohol Use: Rarely; Drug Use: No History; Caffeine Use: Daily; Financial Concerns: No; Food, Clothing or Shelter Needs: No; Support System Lacking: No; Transportation Concerns: No Physician Affirmation I have reviewed and agree with the above information. Electronic Signature(s) Signed: 08/04/2019 2:47:02 AM By: Worthy Keeler PA-C Signed: 08/04/2019 11:48:59 AM By: Army Melia Entered By: Worthy Keeler on 08/03/2019 22:52:17 Lagace, Viviann Spare  ([id number]) -------------------------------------------------------------------------------- SuperBill Details Patient Name: LIBAN, GUEDES. Date of Service: 08/03/2019 Medical Record Number: [id number] Patient Account Number: [id number] Date of Birth/Sex: December 09, 1946 (73 y.o. 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

## 2019-08-08 DIAGNOSIS — I10 Essential (primary) hypertension: Secondary | ICD-10-CM | POA: Diagnosis not present

## 2019-08-08 DIAGNOSIS — E119 Type 2 diabetes mellitus without complications: Secondary | ICD-10-CM | POA: Diagnosis not present

## 2019-08-08 DIAGNOSIS — Z794 Long term (current) use of insulin: Secondary | ICD-10-CM | POA: Diagnosis not present

## 2019-08-08 DIAGNOSIS — R739 Hyperglycemia, unspecified: Secondary | ICD-10-CM | POA: Diagnosis not present

## 2019-08-10 ENCOUNTER — Other Ambulatory Visit: Payer: Self-pay

## 2019-08-10 ENCOUNTER — Encounter: Payer: PPO | Admitting: Physician Assistant

## 2019-08-10 DIAGNOSIS — L97829 Non-pressure chronic ulcer of other part of left lower leg with unspecified severity: Secondary | ICD-10-CM | POA: Diagnosis not present

## 2019-08-10 DIAGNOSIS — S80822A Blister (nonthermal), left lower leg, initial encounter: Secondary | ICD-10-CM | POA: Diagnosis not present

## 2019-08-10 DIAGNOSIS — I890 Lymphedema, not elsewhere classified: Secondary | ICD-10-CM | POA: Diagnosis not present

## 2019-08-10 NOTE — Progress Notes (Addendum)
KHAIR, CHASTEEN ([id number]) Visit Report for 08/10/2019 Arrival Information Details Patient Name: [name], [name]. Date of Service: 08/10/2019 3:45 PM Medical Record Number: [id number] Patient Account Number: [id number] Date of Birth/Sex: [birth year] (73 y.o. M) Treating RN: Montey Hora Primary Care Lashane Whelpley: Dion Body Other Clinician: Referring Mykell Rawl: Dion Body Treating Helen Winterhalter/Extender: Melburn Hake, HOYT Weeks in Treatment: 5 Visit Information History Since Last Visit Added or deleted any medications: No Patient Arrived: Ambulatory Any new allergies or adverse reactions: No Arrival Time: 15:59 Had a fall or experienced change in No Accompanied By: self activities of daily living that may affect Transfer Assistance: None risk of falls: Patient Identification Verified: Yes Signs or symptoms of abuse/neglect since last visito No Secondary Verification Process Completed: Yes Hospitalized since last visit: No Patient Has Alerts: Yes Implantable device outside of the clinic excluding No Patient Alerts: DMII cellular tissue based products placed in the center since last visit: Has Dressing in Place as Prescribed: Yes Has Compression in Place as Prescribed: Yes Pain Present Now: Yes Electronic Signature(s) Signed: 08/10/2019 4:45:21 PM By: Montey Hora Entered By: Montey Hora on 08/10/2019 16:00:49 Kattner, Viviann Spare ([id number]) -------------------------------------------------------------------------------- Compression Therapy Details Patient Name: [name] Date of Service: 08/10/2019 3:45 PM Medical Record Number: [id number] Patient Account Number: [id number] Date of Birth/Sex: 1946-01-01 (73 y.o. M) Treating RN: Army Melia Primary Care Teyana Pierron: Dion Body Other Clinician: Referring Granger Chui: Dion Body Treating Dhiren Azimi/Extender: Melburn Hake, HOYT Weeks in Treatment: 5 Compression Therapy Performed for Wound Assessment:  NonWound Condition Other Dermatologic Condition - Left Leg Performed By: Clinician Army Melia, RN Compression Type: Three Layer Pre Treatment ABI: 1 Post Procedure Diagnosis Same as Pre-procedure Electronic Signature(s) Signed: 08/10/2019 4:39:13 PM By: Army Melia Entered By: Army Melia on 08/10/2019 16:21:18 Swoveland, Viviann Spare ([id number]) -------------------------------------------------------------------------------- Encounter Discharge Information Details Patient Name: [name]. Date of Service: 08/10/2019 3:45 PM Medical Record Number: [id number] Patient Account Number: [id number] Date of Birth/Sex: Jan 30, 1946 (73 y.o. M) Treating RN: Army Melia Primary Care Ariellah Faust: Dion Body Other Clinician: Referring Militza Devery: Dion Body Treating Koda Defrank/Extender: Melburn Hake, HOYT Weeks in Treatment: 5 Encounter Discharge Information Items Discharge Condition: Stable Ambulatory Status: Ambulatory Discharge Destination: Home Transportation: Private Auto Accompanied By: self Schedule Follow-up Appointment: Yes Clinical Summary of Care: Electronic Signature(s) Signed: 08/10/2019 4:39:13 PM By: Army Melia Entered By: Army Melia on 08/10/2019 16:24:18 Waxman, Viviann Spare ([id number]) -------------------------------------------------------------------------------- Lower Extremity Assessment Details Patient Name: [name]. Date of Service: 08/10/2019 3:45 PM Medical Record Number: [id number] Patient Account Number: [id number] Date of Birth/Sex: 18-Mar-1946 (73 y.o. M) Treating RN: Montey Hora Primary Care Edgar Corrigan: Dion Body Other Clinician: Referring Lisset Ketchem: Dion Body Treating Gennesis Hogland/Extender: Melburn Hake, HOYT Weeks in Treatment: 5 Edema Assessment Assessed: [Left: No] [Right: No] Edema: [Left: No] [Right: No] Calf Left: Right: Point of Measurement: 32 cm From Medial Instep 33.4 cm 33.2 cm Ankle Left: Right: Point of  Measurement: 11 cm From Medial Instep 23.2 cm 22 cm Vascular Assessment Pulses: Dorsalis Pedis Palpable: [Left:Yes] [Right:Yes] Electronic Signature(s) Signed: 08/10/2019 4:45:21 PM By: Montey Hora Entered By: Montey Hora on 08/10/2019 16:09:21 Imran, Viviann Spare ([id number]) -------------------------------------------------------------------------------- Multi Wound Chart Details Patient Name: [name] Date of Service: 08/10/2019 3:45 PM Medical Record Number: [id number] Patient Account Number: [id number] Date of Birth/Sex: [birth year] (73 y.o. M) Treating RN: Army Melia Primary Care Susie Ehresman: Dion Body Other Clinician: Referring Makenzee Choudhry: Dion Body Treating Kamalani Mastro/Extender: Melburn Hake, HOYT Weeks in Treatment: 5 Vital Signs Height(in): 72 Pulse(bpm): 82 Weight(lbs): 245 Blood Pressure(mmHg): 133/71  Body Mass Index(BMI): 33 Temperature(F): 98.5 Respiratory Rate 18 (breaths/min): Wound Assessments Treatment Notes Electronic Signature(s) Signed: 08/10/2019 4:39:13 PM By: Army Melia Entered By: Army Melia on 08/10/2019 16:20:53 Okun, Viviann Spare ([id number]) -------------------------------------------------------------------------------- Chunky Details Patient Name: [name]. Date of Service: 08/10/2019 3:45 PM Medical Record Number: [id number] Patient Account Number: [id number] Date of Birth/Sex: 14-Oct-1946 (73 y.o. M) Treating RN: Army Melia Primary Care Keishon Chavarin: Dion Body Other Clinician: Referring Ricke Kimoto: Dion Body Treating Damaria Vachon/Extender: Melburn Hake, HOYT Weeks in Treatment: 5 Active Inactive Electronic Signature(s) Signed: 08/10/2019 4:39:13 PM By: Army Melia Entered By: Army Melia on 08/10/2019 16:20:43 Migues, Viviann Spare ([id number]) -------------------------------------------------------------------------------- Non-Wound Condition Assessment Details Patient Name:  [name]. Date of Service: 08/10/2019 3:45 PM Medical Record Number: [id number] Patient Account Number: [id number] Date of Birth/Sex: 09-20-1946 (73 y.o. M) Treating RN: Montey Hora Primary Care Meggie Laseter: Dion Body Other Clinician: Referring Saleha Kalp: Dion Body Treating Isma Tietje/Extender: Melburn Hake, HOYT Weeks in Treatment: 5 Non-Wound Condition: Condition: Other Dermatologic Condition Location: Leg Side: Left Photos Notes intact blister on left lower leg Electronic Signature(s) Signed: 08/10/2019 4:45:21 PM By: Montey Hora Entered By: Montey Hora on 08/10/2019 16:11:58 Passe, Viviann Spare ([id number]) -------------------------------------------------------------------------------- Pain Assessment Details Patient Name: [name] Date of Service: 08/10/2019 3:45 PM Medical Record Number: [id number] Patient Account Number: [id number] Date of Birth/Sex: 12/04/1946 (73 y.o. M) Treating RN: Montey Hora Primary Care Kellee Sittner: Dion Body Other Clinician: Referring Gayatri Teasdale: Dion Body Treating Kritika Stukes/Extender: Melburn Hake, HOYT Weeks in Treatment: 5 Active Problems Location of Pain Severity and Description of Pain Patient Has Paino Yes Site Locations Pain Location: Generalized Pain Duration of the Pain. Constant / Intermittento Constant Character of Pain Describe the Pain: Other: stings Pain Management and Medication Current Pain Management: Electronic Signature(s) Signed: 08/10/2019 4:45:21 PM By: Montey Hora Entered By: Montey Hora on 08/10/2019 16:01:11 Brouillet, Viviann Spare ([id number]) -------------------------------------------------------------------------------- Patient/Caregiver Education Details Patient Name: [name] Date of Service: 08/10/2019 3:45 PM Medical Record Number: [id number] Patient Account Number: [id number] Date of Birth/Gender: [birth year] (73 y.o. M) Treating RN: Army Melia Primary  Care Physician: Dion Body Other Clinician: Referring Physician: Dion Body Treating Physician/Extender: Sharalyn Ink in Treatment: 5 Education Assessment Education Provided To: Patient Education Topics Provided Wound/Skin Impairment: Handouts: Caring for Your Ulcer Methods: Demonstration, Explain/Verbal Responses: State content correctly Electronic Signature(s) Signed: 08/10/2019 4:39:13 PM By: Army Melia Entered By: Army Melia on 08/10/2019 16:23:21 Bartelson, Viviann Spare ([id number]) -------------------------------------------------------------------------------- Vitals Details Patient Name: MUHANAD, TOROSYAN. Date of Service: 08/10/2019 3:45 PM Medical Record Number: [id number] Patient Account Number: [id number] Date of Birth/Sex: 05/22/1946 (73 y.o. M) Treating RN: Montey Hora Primary Care Cynia Abruzzo: Dion Body Other Clinician: Referring Boyde Grieco: Dion Body Treating Perina Salvaggio/Extender: Melburn Hake, HOYT Weeks in Treatment: 5 Vital Signs Time Taken: 16:01 Temperature (F): 98.5 Height (in): 72 Pulse (bpm): 82 Weight (lbs): 245 Respiratory Rate (breaths/min): 18 Body Mass Index (BMI): 33.2 Blood Pressure (mmHg): 133/71 Reference Range: 80 - 120 mg / dl Electronic Signature(s) Signed: 08/10/2019 4:45:21 PM By: Montey Hora Entered By: Montey Hora on 08/10/2019 16:02:35

## 2019-08-10 NOTE — Progress Notes (Addendum)
KEYAAN, LEDERMAN ([id number]) Visit Report for 08/10/2019 Chief Complaint Document Details Patient Name: [name], [name]. Date of Service: 08/10/2019 3:45 PM Medical Record Number: [id number] Patient Account Number: [id number] Date of Birth/Sex: 10/15/1946 (73 y.o. M) Treating RN: Army Melia Primary Care Provider: Dion Body Other Clinician: Referring Provider: Dion Body Treating Provider/Extender: Melburn Hake, HOYT Weeks in Treatment: 5 Information Obtained from: Patient Chief Complaint Bilateral LE edema Electronic Signature(s) Signed: 08/10/2019 3:56:59 PM By: Worthy Keeler PA-C Entered By: Worthy Keeler on 08/10/2019 15:56:59 Schussler, [name] ([id number]) -------------------------------------------------------------------------------- HPI Details Patient Name: [name] Date of Service: 08/10/2019 3:45 PM Medical Record Number: [id number] Patient Account Number: [id number] Date of Birth/Sex: 08-Nov-1946 (73 y.o. M) Treating RN: Army Melia Primary Care Provider: Dion Body Other Clinician: Referring Provider: Dion Body Treating Provider/Extender: Melburn Hake, HOYT Weeks in Treatment: 5 History of Present Illness HPI Description: 07/06/19 but evaluation today patient patient presents for initial evaluation our clinic concerning issues that he's been having with his bilateral lower extremities for some time. He has actually appears to be doing rather well more recently although he does have a blister on the right anterior shin in several areas that appear to be showing signs of not being completely open but have recently been so. This is on the bilateral lower extremities. He does have lymphedema as well as being stasis according to what I'm seeing at this point. As previously been in [employer] with some good result. With that being said I do believe that based on what I'm seeing at this point he may benefit from a compression wrap  to squeeze out some of the fluid and then measure him for appropriate compression stockings size for him to pick something up at an elastic therapy in [place]. The patient is not opposed to this. The good news is there's no signs of infection and no obvious and significant wounds at this point. He does have a history of hypertension, he's had his right kidney removed secondary to cancer from what he tells me, and again does have lymphedema as well as chronic venous insufficiency. The good news is he has excellent APIs and seems to have great blood flow with good pulses. 07/14/19 on evaluation today patient actually appears to be doing well with regard to his lower extremity ulcers. In fact based on what I'm seeing at this point I feel like that the patient has done very well and again there does not appear to be any signs of active drainage new infection at this time which is excellent news. He did have a little bit of drainage on the wrap but again I feel like this was mainly due to some older areas that just hadn't completely sealed up. Nonetheless he hasn't gotten his compression stockings yet he does need these and I do think potentially wrapping him when one week before completely discharging him from wound care center would be a benefit for him. 07/20/19 on evaluation today patient appears to be doing very well at this point with regard to his lower extremities. There's no signs of active infection at this time there are no open wounds noted currently. Overall I'm very pleased with how his legs have done. He has ordered his compression stockings that have not arrived as of yet. 08/03/19 upon evaluation today for patient presents for reevaluation of our clinic although I had just discharged him from wound care services just a couple weeks ago. He has been wearing the compression  stockings that he ordered and seem to be doing okay until he began to develop several blister areas unfortunately. There  does not appear to be any signs of active infection at this time which is good news. With that being said the blistering is mostly on the left lower extremity although he has some areas on the right there's also some region of your theme I noted today. 08/10/2019 upon evaluation today patient actually appears to be doing quite well with regard to his left lower extremity ulcer where he has a blister. The blister is showing signs of absorbing although it is not completely absorbed at this point. He has no discomfort which is good news. No fevers, chills, nausea, vomiting, or diarrhea. Electronic Signature(s) Signed: 08/10/2019 5:59:26 PM By: Worthy Keeler PA-C Entered By: Worthy Keeler on 08/10/2019 17:59:26 Soley, [name] ([id number]) -------------------------------------------------------------------------------- Physical Exam Details Patient Name: BLAKELEY, MARGRAF. Date of Service: 08/10/2019 3:45 PM Medical Record Number: [id number] Patient Account Number: [id number] Date of Birth/Sex: [birth year] (73 y.o. M) Treating RN: Army Melia Primary Care Provider: Dion Body Other Clinician: Referring Provider: Dion Body Treating Provider/Extender: Melburn Hake, HOYT Weeks in Treatment: 5 Constitutional Well-nourished and well-hydrated in no acute distress. Respiratory normal breathing without difficulty. clear to auscultation bilaterally. Cardiovascular regular rate and rhythm with normal S1, S2. Psychiatric this patient is able to make decisions and demonstrates good insight into disease process. Alert and Oriented x 3. pleasant and cooperative. Notes Upon inspection today patient's wound bed again really does not show any opening he just has an area of blistering at this time. Overall he seems to be doing much better and I do believe this is resolving I still think it may be best to wrap his left leg although I think the right leg can probably go without being  wrapped. Electronic Signature(s) Signed: 08/10/2019 6:00:06 PM By: Worthy Keeler PA-C Entered By: Worthy Keeler on 08/10/2019 18:00:06 [name], [name] ([id number]) -------------------------------------------------------------------------------- Physician Orders Details Patient Name: [name], [name]. Date of Service: 08/10/2019 3:45 PM Medical Record Number: [id number] Patient Account Number: [id number] Date of Birth/Sex: [birth year] (73 y.o. 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 Edema Control o 3 Layer Compression System - Left Lower Extremity o Other: - Tubi grip on R leg Electronic Signature(s) Signed: 08/10/2019 4:39:13 PM By: Army Melia Signed: 08/10/2019 10:42:11 PM By: Worthy Keeler PA-C Entered By: Army Melia on 08/10/2019 16:23:06 Doscher, [name] ([id number]) -------------------------------------------------------------------------------- Problem List Details Patient Name: FREDI, GEILER. Date of Service: 08/10/2019 3:45 PM Medical Record Number: [id number] Patient Account Number: [id number] Date of Birth/Sex: 1946-02-25 (73 y.o. 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 Date of Service: 08/10/2019 3:45 PM Medical Record Number: [id number] Patient Account Number: [id number] Date of Birth/Sex: [birth year] (73 y.o. M) Treating RN: Army Melia Primary Care Provider: Dion Body Other Clinician: Referring Provider: Dion Body Treating Provider/Extender: Melburn Hake, HOYT Weeks in Treatment: 5 Subjective Chief Complaint Information obtained from Patient Bilateral LE edema History of Present Illness (HPI) 07/06/19 but evaluation today patient patient presents for initial evaluation our clinic concerning issues that he's been having with his bilateral lower extremities for some time. He has actually appears to be doing rather well more recently although he does have a blister on the right anterior shin in several areas that appear to be showing signs of not being completely open but have recently been so. This is on the bilateral lower extremities. He does have lymphedema as well as being stasis according to what I'm seeing at this point. As previously been in [employer] with some good result. With that being said I do believe that based on what I'm seeing at this point he may benefit from a  compression wrap to squeeze out some of the fluid and then measure him for appropriate compression stockings size for him to pick something up at an elastic therapy in [place]. The patient is not opposed to this. The good news is there's no signs of infection and no obvious and significant wounds at this point. He does have a history of hypertension, he's had his right kidney removed secondary to cancer from what he tells me, and again does have lymphedema as well as chronic venous insufficiency. The good news is he has excellent APIs and seems to have great blood flow with good pulses. 07/14/19 on evaluation today patient actually appears to be doing well with regard to his lower extremity ulcers. In fact based on what I'm seeing at this point I feel like that the patient has done very well and again there does not appear to be any signs of active drainage new infection at this time which is excellent news. He did have a little bit of drainage on the wrap but again I feel like this was mainly due to some older areas that just hadn't completely sealed up. Nonetheless he hasn't gotten his compression stockings yet he does need these and I do think potentially wrapping him when one week before completely discharging him from wound care center would be a benefit for him. 07/20/19 on evaluation today patient appears to be doing very well at this point with regard to his lower extremities. There's no signs of active infection at this time there are no open wounds noted currently. Overall I'm very pleased with how his legs have done. He has ordered his compression stockings that have not arrived as of yet. 08/03/19 upon evaluation today for patient presents for reevaluation of our clinic although I had just discharged him from wound care services just a couple weeks ago. He has been wearing the compression stockings that he ordered and seem to be doing okay until he began to develop several blister areas  unfortunately. There does not appear to be any signs of active infection at this time which is good  news. With that being said the blistering is mostly on the left lower extremity although he has some areas on the right there's also some region of your theme I noted today. 08/10/2019 upon evaluation today patient actually appears to be doing quite well with regard to his left lower extremity ulcer where he has a blister. The blister is showing signs of absorbing although it is not completely absorbed at this point. He has no discomfort which is good news. No fevers, chills, nausea, vomiting, or diarrhea. Patient History Information obtained from Patient. Family History Diabetes - Siblings, Heart Disease - Siblings, Hypertension - Siblings, No family history of Cancer, Hereditary Spherocytosis, Kidney Disease, Lung Disease, Seizures, Stroke, Thyroid Problems, Tuberculosis. [name], [name] ([id number]) Social History Former smoker - quit 50+ years ago, Marital Status - Married, Alcohol Use - Rarely, Drug Use - No History, Caffeine Use - Daily. Medical History Eyes Denies history of Cataracts, Glaucoma, Optic Neuritis Cardiovascular Patient has history of Hypertension Denies history of Angina, Arrhythmia, Congestive Heart Failure, Coronary Artery Disease, Deep Vein Thrombosis, Hypotension, Myocardial Infarction, Peripheral Arterial Disease, Peripheral Venous Disease, Phlebitis, Vasculitis Endocrine Patient has history of Type II Diabetes Denies history of Type I Diabetes Genitourinary Denies history of End Stage Renal Disease Integumentary (Skin) Denies history of History of Burn, History of pressure wounds Musculoskeletal Patient has history of Osteoarthritis Denies history of Gout, Rheumatoid Arthritis, Osteomyelitis Oncologic Denies history of Received Chemotherapy, Received Radiation Medical And Surgical History Notes 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 Respiratory Denies complaints or symptoms of Chronic or frequent coughs, Shortness of Breath. Cardiovascular Denies complaints or symptoms of Chest pain, LE edema. Psychiatric Denies complaints or symptoms of Anxiety, Claustrophobia. Objective Constitutional Well-nourished and well-hydrated in no acute distress. Vitals Time Taken: 4:01 PM, Height: 72 in, Weight: 245 lbs, BMI: 33.2, Temperature: 98.5 F, Pulse: 82 bpm, Respiratory Rate: 18 breaths/min, Blood Pressure: 133/71 mmHg. [name], [name] ([id number]) Respiratory normal breathing without difficulty. clear to auscultation bilaterally. Cardiovascular regular rate and rhythm with normal S1, S2. Psychiatric this patient is able to make decisions and demonstrates good insight into disease process. Alert and Oriented x 3. pleasant and cooperative. General Notes: Upon inspection today patient's wound bed again really does not show any opening he just has an area of blistering at this time. Overall he seems to be doing much better and I do believe this is resolving I still think it may be best to wrap his left leg although I think the right leg can probably go without being wrapped. Other Condition(s) Patient presents with Other Dermatologic Condition located on the Left Leg. General Notes: intact blister on left lower leg Assessment Active Problems ICD-10 Lymphedema, not elsewhere classified Type 2 diabetes mellitus with other skin complications Venous insufficiency (chronic) (peripheral) Essential (primary) hypertension Acquired absence of kidney Procedures There was a Three Layer Compression Therapy Procedure with a pre-treatment ABI of 1 by Army Melia, RN. Post procedure Diagnosis Wound #:  Same as Pre-Procedure Plan Primary Wound Dressing: Silver Alginate - TCA on rash areas Secondary Dressing: ABD pad Dressing Change Frequency: Change dressing every week Follow-up Appointments: [name], [name] ([id number]) Return Appointment in 1 week. Edema Control: 3 Layer Compression System - Left Lower Extremity Other: - Tubi grip on R leg 1. At this  point I am going to recommend that we continue with the 3 layer compression wrap on the left lower extremity since this seems to be doing well he is in agreement with this plan. 2. I am also going to suggest that we go ahead and have the patient transition into using his compression stocking for the right lower extremity I think that this is doing quite well and I think will do fine with the compression in this manner. He is in agreement with this plan. 3. I recommend the patient continue to elevate his legs as much as possible as well as I think this can definitely be beneficial. We will see patient back for reevaluation in 1 week here in the clinic. If anything worsens or changes patient will contact our office for additional recommendations. Electronic Signature(s) Signed: 08/10/2019 6:01:47 PM By: Worthy Keeler PA-C Entered By: Worthy Keeler on 08/10/2019 18:01:46 [name], [name] ([id number]) -------------------------------------------------------------------------------- ROS/PFSH Details Patient Name: [name] Date of Service: 08/10/2019 3:45 PM Medical Record Number: [id number] Patient Account Number: [id number] Date of Birth/Sex: 1946-11-20 (73 y.o. M) Treating RN: Army Melia Primary Care Provider: Dion Body Other Clinician: Referring Provider: Dion Body Treating Provider/Extender: Melburn Hake, HOYT Weeks in Treatment: 5 Information Obtained From Patient Constitutional Symptoms (General Health) Complaints and Symptoms: Negative for: Fatigue; Fever; Chills; Marked Weight  Change Respiratory Complaints and Symptoms: Negative for: Chronic or frequent coughs; Shortness of Breath Cardiovascular Complaints and Symptoms: Negative for: Chest pain; LE edema Medical History: Positive for: Hypertension Negative for: Angina; Arrhythmia; Congestive Heart Failure; Coronary Artery Disease; Deep Vein Thrombosis; Hypotension; Myocardial Infarction; Peripheral Arterial Disease; Peripheral Venous Disease; Phlebitis; Vasculitis Psychiatric Complaints and Symptoms: Negative for: Anxiety; Claustrophobia Eyes Medical History: Negative for: Cataracts; Glaucoma; Optic Neuritis Past Medical History Notes: blind in left eye Endocrine Medical History: Positive for: Type II Diabetes Negative for: Type I Diabetes Treated with: Insulin, Oral agents Blood sugar tested every day: Yes Tested : BID Genitourinary Medical History: Negative for: End Stage Renal Disease Past Medical History Notes: right kidney has been removed [name], [name] ([id number]) Integumentary (Skin) Medical History: Negative for: History of Burn; History of pressure wounds Musculoskeletal Medical History: Positive for: Osteoarthritis Negative for: Gout; Rheumatoid Arthritis; Osteomyelitis Oncologic Medical History: Negative for: Received Chemotherapy; Received Radiation Past Medical History Notes: renal cancer - removed right kidney; throat cancer; bladder cancer Immunizations Pneumococcal Vaccine: Received Pneumococcal Vaccination: No Implantable Devices None Family and Social History Cancer: No; Diabetes: Yes - Siblings; Heart Disease: Yes - Siblings; Hereditary Spherocytosis: No; Hypertension: Yes - Siblings; Kidney Disease: No; Lung Disease: No; Seizures: No; Stroke: No; Thyroid Problems: No; Tuberculosis: No; Former smoker - quit 50+ years ago; Marital Status - Married; Alcohol Use: Rarely; Drug Use: No History; Caffeine Use: Daily; Financial Concerns: No; Food, Clothing or Shelter  Needs: No; Support System Lacking: No; Transportation Concerns: No Physician Affirmation I have reviewed and agree with the above information. Electronic Signature(s) Signed: 08/10/2019 10:42:11 PM By: Worthy Keeler PA-C Signed: 08/11/2019 3:55:59 PM By: Army Melia Entered By: Worthy Keeler on 08/10/2019 17:59:46 [name], [name] ([id number]) -------------------------------------------------------------------------------- SuperBill Details Patient Name: [name], [name]. Date of Service: 08/10/2019 Medical Record Number: [id number] Patient Account Number: [id number] Date of Birth/Sex: [birth year] (73 y.o. M) Treating RN: Army Melia Primary Care Provider: Dion Body Other Clinician: Referring Provider: Dion Body Treating Provider/Extender: Melburn Hake, HOYT Weeks in Treatment: 5 Diagnosis Coding ICD-10 Codes Code Description I89.0 Lymphedema, not elsewhere classified E11.628 Type 2 diabetes  mellitus with other skin complications W41.3 Venous insufficiency (chronic) (peripheral) I10 Essential (primary) hypertension Z90.5 Acquired absence of kidney Facility Procedures CPT4 Code: [id number] Description: (Facility Use Only) [phone] - [place] [facility] LT LEG Modifier: Quantity: 1 Physician Procedures CPT4 Code: [id number] Description: 95638 - WC PHYS LEVEL 4 - EST PT ICD-10 Diagnosis Description I89.0 Lymphedema, not elsewhere classified E11.628 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

## 2019-08-17 ENCOUNTER — Encounter: Payer: PPO | Admitting: Physician Assistant

## 2019-08-17 ENCOUNTER — Other Ambulatory Visit: Payer: Self-pay

## 2019-08-17 DIAGNOSIS — S80822A Blister (nonthermal), left lower leg, initial encounter: Secondary | ICD-10-CM | POA: Diagnosis not present

## 2019-08-17 DIAGNOSIS — R21 Rash and other nonspecific skin eruption: Secondary | ICD-10-CM | POA: Diagnosis not present

## 2019-08-17 DIAGNOSIS — R600 Localized edema: Secondary | ICD-10-CM | POA: Diagnosis not present

## 2019-08-17 NOTE — Progress Notes (Addendum)
DUEY, [name] ([id number]) Visit Report for 08/17/2019 Arrival Information Details Patient Name: [name], [name]. Date of Service: 08/17/2019 3:45 PM Medical Record Number: [id number] Patient Account Number: [id number] Date of Birth/Sex: Jan 12, 1946 (73 y.o. M) Treating RN: Montey Hora Primary Care Harold Moncus: [name] Other Clinician: Referring Jeffren Dombek: [name] Treating Mia Milan/Extender: Melburn Hake, HOYT Weeks in Treatment: 6 Visit Information History Since Last Visit Added or deleted any medications: No Patient Arrived: Ambulatory Any new allergies or adverse reactions: No Arrival Time: 15:54 Had a fall or experienced change in No Accompanied By: self activities of daily living that may affect Transfer Assistance: None risk of falls: Patient Identification Verified: Yes Signs or symptoms of abuse/neglect since last visito No Secondary Verification Process Completed: Yes Hospitalized since last visit: No Patient Has Alerts: Yes Implantable device outside of the clinic excluding No Patient Alerts: DMII cellular tissue based products placed in the center since last visit: Has Dressing in Place as Prescribed: Yes Has Compression in Place as Prescribed: Yes Pain Present Now: No Electronic Signature(s) Signed: 08/17/2019 4:23:33 PM By: Montey Hora Entered By: Montey Hora on 08/17/2019 15:54:50 Penrod, Viviann Spare ([id number]) -------------------------------------------------------------------------------- Encounter Discharge Information Details Patient Name: [name] Date of Service: 08/17/2019 3:45 PM Medical Record Number: [id number] Patient Account Number: [id number] Date of Birth/Sex: October 03, 1946 (73 y.o. M) Treating RN: Army Melia Primary Care Chee Dimon: [name] Other Clinician: Referring Loveah Like: [name] Treating Cecilio Ohlrich/Extender: Melburn Hake, HOYT Weeks in Treatment: 6 Encounter Discharge Information  Items Discharge Condition: Stable Ambulatory Status: Ambulatory Discharge Destination: Home Transportation: Other Accompanied By: self Schedule Follow-up Appointment: Yes Clinical Summary of Care: Electronic Signature(s) Signed: 08/17/2019 4:30:40 PM By: Army Melia Entered By: Army Melia on 08/17/2019 16:22:11 Haile, Viviann Spare ([id number]) -------------------------------------------------------------------------------- Lower Extremity Assessment Details Patient Name: [name] Date of Service: 08/17/2019 3:45 PM Medical Record Number: [id number] Patient Account Number: [id number] Date of Birth/Sex: 1946/06/13 (73 y.o. M) Treating RN: Montey Hora Primary Care Seydou Hearns: [name] Other Clinician: Referring Shequila Neglia: [name] Treating Caidan Hubbert/Extender: Melburn Hake, HOYT Weeks in Treatment: 6 Edema Assessment Assessed: [Left: No] [Right: No] Edema: [Left: Yes] [Right: Yes] Calf Left: Right: Point of Measurement: 32 cm From Medial Instep 34.5 cm 33.4 cm Ankle Left: Right: Point of Measurement: 11 cm From Medial Instep 23 cm 23 cm Vascular Assessment Pulses: Dorsalis Pedis Palpable: [Left:Yes] [Right:Yes] Electronic Signature(s) Signed: 08/17/2019 4:23:33 PM By: Montey Hora Entered By: Montey Hora on 08/17/2019 16:01:36 Perea, Viviann Spare ([id number]) -------------------------------------------------------------------------------- Multi Wound Chart Details Patient Name: [name] Date of Service: 08/17/2019 3:45 PM Medical Record Number: [id number] Patient Account Number: [id number] Date of Birth/Sex: 04-02-1946 (73 y.o. M) Treating RN: Army Melia Primary Care Juliona Vales: [name] Other Clinician: Referring Dominque Marlin: [name] Treating Sanna Porcaro/Extender: Melburn Hake, HOYT Weeks in Treatment: 6 Vital Signs Height(in): 72 Pulse(bpm): 79 Weight(lbs): 245 Blood Pressure(mmHg): 139/68 [name] Mass Index(BMI):  33 Temperature(F): 98.0 Respiratory Rate 18 (breaths/min): Wound Assessments Treatment Notes Electronic Signature(s) Signed: 08/17/2019 4:30:40 PM By: Army Melia Entered By: Army Melia on 08/17/2019 16:19:41 Sites, Viviann Spare ([id number]) -------------------------------------------------------------------------------- Downing Details Patient Name: [name], [name]. Date of Service: 08/17/2019 3:45 PM Medical Record Number: [id number] Patient Account Number: [id number] Date of Birth/Sex: February 20, 1946 (73 y.o. M) Treating RN: Army Melia Primary Care Evaleigh Mccamy: [name] Other Clinician: Referring Jaye Polidori: [name] Treating Devynn Scheff/Extender: Melburn Hake, HOYT Weeks in Treatment: 6 Active Inactive Electronic Signature(s) Signed: 08/17/2019 4:30:40 PM By: Army Melia Entered By: Army Melia on 08/17/2019 16:19:32 Carbonneau, Arnell Sieving  Darnell Level ([id number]) -------------------------------------------------------------------------------- Pain Assessment Details Patient Name: [name], [name]. Date of Service: 08/17/2019 3:45 PM Medical Record Number: [id number] Patient Account Number: [id number] Date of Birth/Sex: 12/23/1946 (73 y.o. M) Treating RN: Montey Hora Primary Care Mark Benecke: [name] Other Clinician: Referring Damian Hofstra: [name] Treating Ricarda Atayde/Extender: Melburn Hake, HOYT Weeks in Treatment: 6 Active Problems Location of Pain Severity and Description of Pain Patient Has Paino No Site Locations Pain Management and Medication Current Pain Management: Electronic Signature(s) Signed: 08/17/2019 4:23:33 PM By: Montey Hora Entered By: Montey Hora on 08/17/2019 15:54:58 Currey, Viviann Spare ([id number]) -------------------------------------------------------------------------------- Patient/Caregiver Education Details Patient Name: [name], [name]. Date of Service: 08/17/2019 3:45 PM Medical Record Number:  [id number] Patient Account Number: [id number] Date of Birth/Gender: [birth year] (73 y.o. M) Treating RN: Army Melia Primary Care Physician: [name] Other Clinician: Referring Physician: Dion [name] Treating Physician/Extender: Sharalyn Ink in Treatment: 6 Education Assessment Education Provided To: Patient Education Topics Provided Wound/Skin Impairment: Handouts: Caring for Your Ulcer Methods: Demonstration, Explain/Verbal Responses: State content correctly Electronic Signature(s) Signed: 08/17/2019 4:30:40 PM By: Army Melia Entered By: Army Melia on 08/17/2019 16:21:46 Alter, Viviann Spare ([id number]) -------------------------------------------------------------------------------- Dent Details Patient Name: [name] Date of Service: 08/17/2019 3:45 PM Medical Record Number: [id number] Patient Account Number: [id number] Date of Birth/Sex: [birth year] (73 y.o. M) Treating RN: Montey Hora Primary Care Lincy Belles: [name] Other Clinician: Referring Ernestina Joe: [name] Treating Cherylin Waguespack/Extender: Melburn Hake, HOYT Weeks in Treatment: 6 Vital Signs Time Taken: 15:56 Temperature (F): 98.0 Height (in): 72 Pulse (bpm): 79 Weight (lbs): 245 Respiratory Rate (breaths/min): 18 [name] Mass Index (BMI): 33.2 Blood Pressure (mmHg): 139/68 Reference Range: 80 - 120 mg / dl Electronic Signature(s) Signed: 08/17/2019 4:23:33 PM By: Montey Hora Entered By: Montey Hora on 08/17/2019 16:00:20

## 2019-08-17 NOTE — Progress Notes (Addendum)
[name], [name] ([id number]) Visit Report for 08/17/2019 Chief Complaint Document Details Patient Name: [name], [name]. Date of Service: 08/17/2019 3:45 PM Medical Record Number: [id number] Patient Account Number: [id number] Date of Birth/Sex: 1946/10/13 (73 y.o. M) Treating RN: Army Melia Primary Care Provider: Dion Body Other Clinician: Referring Provider: Dion Body Treating Provider/Extender: Melburn Hake, Doristine Shehan Weeks in Treatment: 6 Information Obtained from: Patient Chief Complaint Bilateral LE edema Electronic Signature(s) Signed: 08/17/2019 3:37:26 PM By: Worthy Keeler PA-C Entered By: Worthy Keeler on 08/17/2019 15:37:26 Fehringer, [name] ([id number]) -------------------------------------------------------------------------------- HPI Details Patient Name: [name] Date of Service: 08/17/2019 3:45 PM Medical Record Number: [id number] Patient Account Number: [id number] Date of Birth/Sex: [birth year] (73 y.o. M) Treating RN: Army Melia Primary Care Provider: Dion Body Other Clinician: Referring Provider: Dion Body Treating Provider/Extender: Melburn Hake, Tavaughn Silguero Weeks in Treatment: 6 History of Present Illness HPI Description: 07/06/19 but evaluation today patient patient presents for initial evaluation our clinic concerning issues that he's been having with his bilateral lower extremities for some time. He has actually appears to be doing rather well more recently although he does have a blister on the right anterior shin in several areas that appear to be showing signs of not being completely open but have recently been so. This is on the bilateral lower extremities. He does have lymphedema as well as being stasis according to what I'm seeing at this point. As previously been in [employer] with some good result. With that being said I do believe that based on what I'm seeing at this point he may benefit from a compression wrap  to squeeze out some of the fluid and then measure him for appropriate compression stockings size for him to pick something up at an elastic therapy in [place]. The patient is not opposed to this. The good news is there's no signs of infection and no obvious and significant wounds at this point. He does have a history of hypertension, he's had his right kidney removed secondary to cancer from what he tells me, and again does have lymphedema as well as chronic venous insufficiency. The good news is he has excellent APIs and seems to have great blood flow with good pulses. 07/14/19 on evaluation today patient actually appears to be doing well with regard to his lower extremity ulcers. In fact based on what I'm seeing at this point I feel like that the patient has done very well and again there does not appear to be any signs of active drainage new infection at this time which is excellent news. He did have a little bit of drainage on the wrap but again I feel like this was mainly due to some older areas that just hadn't completely sealed up. Nonetheless he hasn't gotten his compression stockings yet he does need these and I do think potentially wrapping him when one week before completely discharging him from wound care center would be a benefit for him. 07/20/19 on evaluation today patient appears to be doing very well at this point with regard to his lower extremities. There's no signs of active infection at this time there are no open wounds noted currently. Overall I'm very pleased with how his legs have done. He has ordered his compression stockings that have not arrived as of yet. 08/03/19 upon evaluation today for patient presents for reevaluation of our clinic although I had just discharged him from wound care services just a couple weeks ago. He has been wearing the compression  stockings that he ordered and seem to be doing okay until he began to develop several blister areas unfortunately. There  does not appear to be any signs of active infection at this time which is good news. With that being said the blistering is mostly on the left lower extremity although he has some areas on the right there's also some region of your theme I noted today. 08/10/2019 upon evaluation today patient actually appears to be doing quite well with regard to his left lower extremity ulcer where he has a blister. The blister is showing signs of absorbing although it is not completely absorbed at this point. He has no discomfort which is good news. No fevers, chills, nausea, vomiting, or diarrhea. 08/17/2019 on evaluation today patient actually appears to be doing quite well with regard to his wounds on the left lower extremity. In fact the blisters appear to have completely resolved and reattached he is having no issues currently. He does have a little bit of a rash on the medial ankle of the right leg but again I do not feel like this is more of an ulcer but rather more of a rash potentially even tinea corporis. I think we may want to try cream to see if that would help in that region. Electronic Signature(s) Signed: 08/17/2019 4:24:02 PM By: Worthy Keeler PA-C Entered By: Worthy Keeler on 08/17/2019 16:24:02 [name], [name] ([id number]) -------------------------------------------------------------------------------- Physical Exam Details Patient Name: [name], [name]. Date of Service: 08/17/2019 3:45 PM Medical Record Number: [id number] Patient Account Number: [id number] Date of Birth/Sex: [birth year] (73 y.o. M) Treating RN: Army Melia Primary Care Provider: Dion Body Other Clinician: Referring Provider: Dion Body Treating Provider/Extender: Melburn Hake, Kimbrely Buckel Weeks in Treatment: 6 Constitutional Well-nourished and well-hydrated in no acute distress. Respiratory normal breathing without difficulty. clear to auscultation bilaterally. Cardiovascular regular rate and rhythm with  normal S1, S2. Psychiatric this patient is able to make decisions and demonstrates good insight into disease process. Alert and Oriented x 3. pleasant and cooperative. Notes Patient's wounds currently again have all resolved the blisters on the left lower extremity have also resolved I think he is ready to get into his compression socks at this point bilaterally. He is already been using this for the right I think the left we can initiate at this point. In regard to the rash I think that a mixture of triamcinolone and nystatin will likely be beneficial for him. Electronic Signature(s) Signed: 08/17/2019 4:24:35 PM By: Worthy Keeler PA-C Entered By: Worthy Keeler on 08/17/2019 16:24:34 Parrilla, [name] ([id number]) -------------------------------------------------------------------------------- Physician Orders Details Patient Name: JOEVANNI, NORTHCUTT. Date of Service: 08/17/2019 3:45 PM Medical Record Number: [id number] Patient Account Number: [id number] Date of Birth/Sex: 1946/08/15 (73 y.o. 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 triamcinolone acetonide 08/17/2019 DOSE topical 0.1 % ointment - ointment  topical to be applied  each night to the rash as directed. Electronic Signature(s) Signed: 08/17/2019 4:27:38 PM By: Worthy Keeler PA-C Entered By: Worthy Keeler on 08/17/2019 16:27:38 Pandolfi, [name] ([id number]) -------------------------------------------------------------------------------- Problem List Details Patient Name: DEMARKO, LUMBARD. Date of Service: 08/17/2019 3:45 PM Medical Record Number: [id number] Patient Account Number: [id number] Date of Birth/Sex: 1946/06/30 (73 y.o. 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 0000000 No Yes I87.2 Venous insufficiency (chronic) (peripheral) 07/06/2019 No Yes I10 Essential (primary) hypertension 07/06/2019 No Yes Z90.5 Acquired absence of kidney 07/06/2019 No Yes Inactive Problems Resolved Problems Electronic Signature(s) Signed: 08/17/2019 3:37:15 PM By: Worthy Keeler PA-C Entered By: Worthy Keeler on 08/17/2019 15:37:15 Ganser, [name] ([id number]) -------------------------------------------------------------------------------- Progress Note Details Patient Name: [name] Date of Service: 08/17/2019 3:45 PM Medical Record Number: [id number] Patient Account Number: [id number] Date of Birth/Sex: [birth year] (73 y.o. M) Treating RN: Army Melia Primary Care Provider: Dion Body Other Clinician: Referring Provider: Dion Body Treating Provider/Extender: Melburn Hake, Koston Hennes Weeks in Treatment: 6 Subjective Chief Complaint Information obtained from Patient Bilateral LE edema History of Present Illness (HPI) 07/06/19 but evaluation today patient patient  presents for initial evaluation our clinic concerning issues that he's been having with his bilateral lower extremities for some time. He has actually appears to be doing rather well more recently although he does have a blister on the right anterior shin in several areas that appear to be showing signs of not being completely open but have recently been so. This is on the bilateral lower extremities. He does have lymphedema as well as being stasis according to what I'm seeing at this point. As previously been in [employer] with some good result. With that being said I do believe that based on what I'm seeing at this point he may benefit from a compression wrap to squeeze out some of the fluid and then measure him for appropriate compression stockings size for him to pick something up at an elastic therapy in [place]. The patient is not opposed to this. The good news is there's no signs of infection and no obvious and significant wounds at this point. He does have a history of hypertension, he's had his right kidney removed secondary to cancer from what he tells me, and again does have lymphedema as well as chronic venous insufficiency. The good news is he has excellent APIs and seems to have great blood flow with good pulses. 07/14/19 on evaluation today patient actually appears to be doing well with regard to his lower extremity ulcers. In fact based on what I'm seeing at this point I feel like that the patient has done very well and again there does not appear to be any signs of active drainage new infection at this time which is excellent news. He did have a little bit of drainage on the wrap but again I feel like this was mainly due to some older areas that just hadn't completely sealed up. Nonetheless he hasn't gotten his compression stockings yet he does need these and I do think potentially wrapping him when one week before completely discharging him from wound care center would be a  benefit for him. 07/20/19 on evaluation today patient appears to be doing very  well at this point with regard to his lower extremities. There's no signs of active infection at this time there are no open wounds noted currently. Overall I'm very pleased with how his legs have done. He has ordered his compression stockings that have not arrived as of yet. 08/03/19 upon evaluation today for patient presents for reevaluation of our clinic although I had just discharged him from wound care services just a couple weeks ago. He has been wearing the compression stockings that he ordered and seem to be doing okay until he began to develop several blister areas unfortunately. There does not appear to be any signs of active infection at this time which is good news. With that being said the blistering is mostly on the left lower extremity although he has some areas on the right there's also some region of your theme I noted today. 08/10/2019 upon evaluation today patient actually appears to be doing quite well with regard to his left lower extremity ulcer where he has a blister. The blister is showing signs of absorbing although it is not completely absorbed at this point. He has no discomfort which is good news. No fevers, chills, nausea, vomiting, or diarrhea. 08/17/2019 on evaluation today patient actually appears to be doing quite well with regard to his wounds on the left lower extremity. In fact the blisters appear to have completely resolved and reattached he is having no issues currently. He does have a little bit of a rash on the medial ankle of the right leg but again I do not feel like this is more of an ulcer but rather more of a rash potentially even tinea corporis. I think we may want to try cream to see if that would help in that region. Patient History Information obtained from Patient. [name], [name] ([id number]) Family History Diabetes - Siblings, Heart Disease - Siblings, Hypertension -  Siblings, No family history of Cancer, Hereditary Spherocytosis, Kidney Disease, Lung Disease, Seizures, Stroke, Thyroid Problems, Tuberculosis. Social History Former smoker - quit 50+ years ago, Marital Status - Married, Alcohol Use - Rarely, Drug Use - No History, Caffeine Use - Daily. Medical History Eyes Denies history of Cataracts, Glaucoma, Optic Neuritis Cardiovascular Patient has history of Hypertension Denies history of Angina, Arrhythmia, Congestive Heart Failure, Coronary Artery Disease, Deep Vein Thrombosis, Hypotension, Myocardial Infarction, Peripheral Arterial Disease, Peripheral Venous Disease, Phlebitis, Vasculitis Endocrine Patient has history of Type II Diabetes Denies history of Type I Diabetes Genitourinary Denies history of End Stage Renal Disease Integumentary (Skin) Denies history of History of Burn, History of pressure wounds Musculoskeletal Patient has history of Osteoarthritis Denies history of Gout, Rheumatoid Arthritis, Osteomyelitis Oncologic Denies history of Received Chemotherapy, Received Radiation Medical And Surgical History Notes 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 Respiratory Denies complaints or symptoms of Chronic or frequent coughs, Shortness of Breath. Cardiovascular Complains or has symptoms of LE edema. Denies complaints or symptoms of Chest pain. Psychiatric Denies complaints or symptoms of Anxiety, Claustrophobia. Objective [name], [name] ([id number]) Constitutional Well-nourished and well-hydrated in no acute distress. Vitals Time Taken: 3:56 PM, Height: 72 in, Weight: 245 lbs, BMI: 33.2, Temperature: 98.0 F, Pulse: 79 bpm, Respiratory Rate: 18 breaths/min, Blood Pressure:  139/68 mmHg. Respiratory normal breathing without difficulty. clear to auscultation bilaterally. Cardiovascular regular rate and rhythm  with normal S1, S2. Psychiatric this patient is able to make decisions and demonstrates good insight into disease process. Alert and Oriented x 3. pleasant and cooperative. General Notes: Patient's wounds currently again have all resolved the blisters on the left lower extremity have also resolved I think he is ready to get into his compression socks at this point bilaterally. He is already been using this for the right I think the left we can initiate at this point. In regard to the rash I think that a mixture of triamcinolone and nystatin will likely be beneficial for him. Assessment Active Problems ICD-10 Lymphedema, not elsewhere classified Type 2 diabetes mellitus with other skin complications Venous insufficiency (chronic) (peripheral) Essential (primary) hypertension Acquired absence of kidney Plan Discharge From [facility] Services: Discharge from [place] - treatment complete, follow up as needed The following medication(s) was prescribed: nystatin topical 100,000 unit/gram cream cream topical to be applied each night to the rash as directed. starting 08/17/2019 triamcinolone acetonide topical 0.1 % ointment ointment topical to be applied each night to the rash as directed. starting 08/17/2019 1. I will discontinue wound care services as he seems to be doing well at this point. He is in agreement with this plan. 2. With regard to his edema I recommend that he continue to utilize the compression stockings at this point he is in [place], [place]. ([id number]) agreement with that plan as well. 3. With regard to the rash on the right medial ankle I am going to suggest a mixture of one-to-one nystatin cream along with triamcinolone to be used each evening for the next 2-4 weeks or until the rash completely resolves. I think this will likely  help to clear this up if he has any concerns in the meantime he knows he can always let me know. Patient will follow-up as needed. Electronic Signature(s) Signed: 08/17/2019 4:27:51 PM By: Worthy Keeler PA-C Entered By: Worthy Keeler on 08/17/2019 16:27:51 [name], [name] ([id number]) -------------------------------------------------------------------------------- ROS/PFSH Details Patient Name: [name], [name]. Date of Service: 08/17/2019 3:45 PM Medical Record Number: [id number] Patient Account Number: [id number] Date of Birth/Sex: [birth year] (74 y.o. M) Treating RN: Army Melia Primary Care Provider: Dion Body Other Clinician: Referring Provider: Dion Body Treating Provider/Extender: Melburn Hake, Dreyden Rohrman Weeks in Treatment: 6 Information Obtained From Patient Constitutional Symptoms (General Health) Complaints and Symptoms: Negative for: Fatigue; Fever; Chills; Marked Weight Change Respiratory Complaints and Symptoms: Negative for: Chronic or frequent coughs; Shortness of Breath Cardiovascular Complaints and Symptoms: Positive for: LE edema Negative for: Chest pain Medical History: Positive for: Hypertension Negative for: Angina; Arrhythmia; Congestive Heart Failure; Coronary Artery Disease; Deep Vein Thrombosis; Hypotension; Myocardial Infarction; Peripheral Arterial Disease; Peripheral Venous Disease; Phlebitis; Vasculitis Psychiatric Complaints and Symptoms: Negative for: Anxiety; Claustrophobia Eyes Medical History: Negative for: Cataracts; Glaucoma; Optic Neuritis Past Medical History Notes: blind in left eye Endocrine Medical History: Positive for: Type II Diabetes Negative for: Type I Diabetes Treated with: Insulin, Oral agents Blood sugar tested every day: Yes Tested : BID Genitourinary Medical History: Negative for: End Stage Renal Disease Past Medical History Notes: [name], [name] ([id number]) right kidney has been  removed Integumentary (Skin) Medical History: Negative for: History of Burn; History of pressure wounds Musculoskeletal Medical History: Positive for: Osteoarthritis Negative for: Gout; Rheumatoid Arthritis; Osteomyelitis Oncologic Medical History: Negative for: Received Chemotherapy; Received Radiation Past Medical History Notes: renal cancer - removed right kidney; throat cancer; bladder cancer Immunizations Pneumococcal Vaccine: Received Pneumococcal Vaccination: No Implantable Devices None Family  and Social History Cancer: No; Diabetes: Yes - Siblings; Heart Disease: Yes - Siblings; Hereditary Spherocytosis: No; Hypertension: Yes - Siblings; Kidney Disease: No; Lung Disease: No; Seizures: No; Stroke: No; Thyroid Problems: No; Tuberculosis: No; Former smoker - quit 50+ years ago; Marital Status - Married; Alcohol Use: Rarely; Drug Use: No History; Caffeine Use: Daily; Financial Concerns: No; Food, Clothing or Shelter Needs: No; Support System Lacking: No; Transportation Concerns: No Physician Affirmation I have reviewed and agree with the above information. Electronic Signature(s) Signed: 08/17/2019 4:30:40 PM By: Army Melia Signed: 08/17/2019 5:39:21 PM By: Worthy Keeler PA-C Entered By: Worthy Keeler on 08/17/2019 16:24:20 [name], [name] ([id number]) -------------------------------------------------------------------------------- SuperBill Details Patient Name: [name], [name]. Date of Service: 08/17/2019 Medical Record Number: [id number] Patient Account Number: [id number] Date of Birth/Sex: January 19, 1946 (73 y.o. 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

## 2019-11-08 DIAGNOSIS — I10 Essential (primary) hypertension: Secondary | ICD-10-CM | POA: Diagnosis not present

## 2019-11-08 DIAGNOSIS — Z794 Long term (current) use of insulin: Secondary | ICD-10-CM | POA: Diagnosis not present

## 2019-11-08 DIAGNOSIS — E119 Type 2 diabetes mellitus without complications: Secondary | ICD-10-CM | POA: Diagnosis not present

## 2019-11-08 DIAGNOSIS — E7800 Pure hypercholesterolemia, unspecified: Secondary | ICD-10-CM | POA: Diagnosis not present

## 2019-11-15 DIAGNOSIS — I10 Essential (primary) hypertension: Secondary | ICD-10-CM | POA: Diagnosis not present

## 2019-11-15 DIAGNOSIS — E119 Type 2 diabetes mellitus without complications: Secondary | ICD-10-CM | POA: Diagnosis not present

## 2019-11-15 DIAGNOSIS — E7800 Pure hypercholesterolemia, unspecified: Secondary | ICD-10-CM | POA: Diagnosis not present

## 2019-11-15 DIAGNOSIS — Z794 Long term (current) use of insulin: Secondary | ICD-10-CM | POA: Diagnosis not present

## 2019-11-29 DIAGNOSIS — G250 Essential tremor: Secondary | ICD-10-CM | POA: Diagnosis not present

## 2020-02-14 DIAGNOSIS — E119 Type 2 diabetes mellitus without complications: Secondary | ICD-10-CM | POA: Diagnosis not present

## 2020-02-14 DIAGNOSIS — Z03818 Encounter for observation for suspected exposure to other biological agents ruled out: Secondary | ICD-10-CM | POA: Diagnosis not present

## 2020-02-14 DIAGNOSIS — Z794 Long term (current) use of insulin: Secondary | ICD-10-CM | POA: Diagnosis not present

## 2020-02-21 DIAGNOSIS — Z0000 Encounter for general adult medical examination without abnormal findings: Secondary | ICD-10-CM | POA: Diagnosis not present

## 2020-02-21 DIAGNOSIS — I10 Essential (primary) hypertension: Secondary | ICD-10-CM | POA: Diagnosis not present

## 2020-02-21 DIAGNOSIS — E119 Type 2 diabetes mellitus without complications: Secondary | ICD-10-CM | POA: Diagnosis not present

## 2020-02-21 DIAGNOSIS — Z794 Long term (current) use of insulin: Secondary | ICD-10-CM | POA: Diagnosis not present

## 2020-02-21 DIAGNOSIS — D692 Other nonthrombocytopenic purpura: Secondary | ICD-10-CM | POA: Diagnosis not present

## 2020-02-21 DIAGNOSIS — E7800 Pure hypercholesterolemia, unspecified: Secondary | ICD-10-CM | POA: Diagnosis not present

## 2020-06-27 DIAGNOSIS — Z1159 Encounter for screening for other viral diseases: Secondary | ICD-10-CM | POA: Diagnosis not present

## 2020-06-27 DIAGNOSIS — E7800 Pure hypercholesterolemia, unspecified: Secondary | ICD-10-CM | POA: Diagnosis not present

## 2020-06-27 DIAGNOSIS — I10 Essential (primary) hypertension: Secondary | ICD-10-CM | POA: Diagnosis not present

## 2020-06-27 DIAGNOSIS — E119 Type 2 diabetes mellitus without complications: Secondary | ICD-10-CM | POA: Diagnosis not present

## 2020-06-27 DIAGNOSIS — Z794 Long term (current) use of insulin: Secondary | ICD-10-CM | POA: Diagnosis not present

## 2020-07-04 DIAGNOSIS — I10 Essential (primary) hypertension: Secondary | ICD-10-CM | POA: Diagnosis not present

## 2020-07-04 DIAGNOSIS — E7800 Pure hypercholesterolemia, unspecified: Secondary | ICD-10-CM | POA: Diagnosis not present

## 2020-07-04 DIAGNOSIS — Z0000 Encounter for general adult medical examination without abnormal findings: Secondary | ICD-10-CM | POA: Diagnosis not present

## 2020-07-04 DIAGNOSIS — E119 Type 2 diabetes mellitus without complications: Secondary | ICD-10-CM | POA: Diagnosis not present

## 2020-07-04 DIAGNOSIS — Z85819 Personal history of malignant neoplasm of unspecified site of lip, oral cavity, and pharynx: Secondary | ICD-10-CM | POA: Diagnosis not present

## 2020-07-04 DIAGNOSIS — R1310 Dysphagia, unspecified: Secondary | ICD-10-CM | POA: Diagnosis not present

## 2020-07-04 DIAGNOSIS — Z794 Long term (current) use of insulin: Secondary | ICD-10-CM | POA: Diagnosis not present

## 2020-07-18 DIAGNOSIS — R1314 Dysphagia, pharyngoesophageal phase: Secondary | ICD-10-CM | POA: Diagnosis not present

## 2020-07-18 DIAGNOSIS — K219 Gastro-esophageal reflux disease without esophagitis: Secondary | ICD-10-CM | POA: Diagnosis not present

## 2020-07-18 DIAGNOSIS — J040 Acute laryngitis: Secondary | ICD-10-CM | POA: Diagnosis not present

## 2020-08-06 DIAGNOSIS — E119 Type 2 diabetes mellitus without complications: Secondary | ICD-10-CM | POA: Diagnosis not present

## 2020-08-06 DIAGNOSIS — Z1159 Encounter for screening for other viral diseases: Secondary | ICD-10-CM | POA: Diagnosis not present

## 2020-08-06 DIAGNOSIS — R197 Diarrhea, unspecified: Secondary | ICD-10-CM | POA: Diagnosis not present

## 2020-08-06 DIAGNOSIS — I10 Essential (primary) hypertension: Secondary | ICD-10-CM | POA: Diagnosis not present

## 2020-08-06 DIAGNOSIS — E7800 Pure hypercholesterolemia, unspecified: Secondary | ICD-10-CM | POA: Diagnosis not present

## 2020-08-06 DIAGNOSIS — Z794 Long term (current) use of insulin: Secondary | ICD-10-CM | POA: Diagnosis not present

## 2020-08-15 DIAGNOSIS — Z85819 Personal history of malignant neoplasm of unspecified site of lip, oral cavity, and pharynx: Secondary | ICD-10-CM | POA: Diagnosis not present

## 2020-08-15 DIAGNOSIS — Z794 Long term (current) use of insulin: Secondary | ICD-10-CM | POA: Diagnosis not present

## 2020-08-15 DIAGNOSIS — E119 Type 2 diabetes mellitus without complications: Secondary | ICD-10-CM | POA: Diagnosis not present

## 2020-08-15 DIAGNOSIS — I10 Essential (primary) hypertension: Secondary | ICD-10-CM | POA: Diagnosis not present

## 2020-08-15 DIAGNOSIS — E7800 Pure hypercholesterolemia, unspecified: Secondary | ICD-10-CM | POA: Diagnosis not present

## 2020-08-29 ENCOUNTER — Other Ambulatory Visit: Payer: Self-pay | Admitting: Otolaryngology

## 2020-08-29 ENCOUNTER — Other Ambulatory Visit (HOSPITAL_COMMUNITY): Payer: Self-pay | Admitting: Otolaryngology

## 2020-08-29 DIAGNOSIS — R1314 Dysphagia, pharyngoesophageal phase: Secondary | ICD-10-CM | POA: Diagnosis not present

## 2020-08-29 DIAGNOSIS — D380 Neoplasm of uncertain behavior of larynx: Secondary | ICD-10-CM

## 2020-08-29 DIAGNOSIS — Z8521 Personal history of malignant neoplasm of larynx: Secondary | ICD-10-CM | POA: Diagnosis not present

## 2020-09-10 DIAGNOSIS — U071 COVID-19: Secondary | ICD-10-CM

## 2020-09-10 DIAGNOSIS — R05 Cough: Secondary | ICD-10-CM | POA: Diagnosis not present

## 2020-09-10 DIAGNOSIS — Z20822 Contact with and (suspected) exposure to covid-19: Secondary | ICD-10-CM | POA: Diagnosis not present

## 2020-09-10 DIAGNOSIS — Z8616 Personal history of COVID-19: Secondary | ICD-10-CM

## 2020-09-10 HISTORY — DX: COVID-19: U07.1

## 2020-09-10 HISTORY — DX: Personal history of COVID-19: Z86.16

## 2020-09-11 ENCOUNTER — Ambulatory Visit: Admission: RE | Admit: 2020-09-11 | Payer: PPO | Source: Ambulatory Visit

## 2020-09-11 DIAGNOSIS — U071 COVID-19: Secondary | ICD-10-CM | POA: Diagnosis not present

## 2020-09-13 DIAGNOSIS — U071 COVID-19: Secondary | ICD-10-CM | POA: Diagnosis not present

## 2020-10-09 ENCOUNTER — Ambulatory Visit
Admission: RE | Admit: 2020-10-09 | Discharge: 2020-10-09 | Disposition: A | Discharge status: Home / Self Care | Payer: PPO | Source: Ambulatory Visit | Attending: Otolaryngology | Admitting: Otolaryngology

## 2020-10-09 ENCOUNTER — Other Ambulatory Visit: Payer: Self-pay

## 2020-10-09 DIAGNOSIS — D380 Neoplasm of uncertain behavior of larynx: Secondary | ICD-10-CM

## 2020-10-09 DIAGNOSIS — J392 Other diseases of pharynx: Secondary | ICD-10-CM | POA: Diagnosis not present

## 2020-10-09 DIAGNOSIS — J029 Acute pharyngitis, unspecified: Secondary | ICD-10-CM | POA: Diagnosis not present

## 2020-10-09 DIAGNOSIS — J387 Other diseases of larynx: Secondary | ICD-10-CM | POA: Diagnosis not present

## 2020-10-09 LAB — POCT I-STAT CREATININE: Creatinine, Ser: 1 mg/dL (ref 0.61–1.24)

## 2020-10-09 MED ORDER — IOHEXOL 300 MG/ML  SOLN
75.0000 mL | Freq: Once | INTRAMUSCULAR | Status: AC | PRN
  Administered 2020-10-09: 75 mL via INTRAVENOUS

## 2020-10-25 DIAGNOSIS — E119 Type 2 diabetes mellitus without complications: Secondary | ICD-10-CM | POA: Diagnosis not present

## 2020-10-25 DIAGNOSIS — Z794 Long term (current) use of insulin: Secondary | ICD-10-CM | POA: Diagnosis not present

## 2020-10-25 DIAGNOSIS — E7800 Pure hypercholesterolemia, unspecified: Secondary | ICD-10-CM | POA: Diagnosis not present

## 2020-10-28 DIAGNOSIS — D380 Neoplasm of uncertain behavior of larynx: Secondary | ICD-10-CM | POA: Diagnosis not present

## 2020-10-28 DIAGNOSIS — Z8521 Personal history of malignant neoplasm of larynx: Secondary | ICD-10-CM | POA: Diagnosis not present

## 2020-10-31 ENCOUNTER — Encounter: Payer: Self-pay | Admitting: Otolaryngology

## 2020-10-31 ENCOUNTER — Other Ambulatory Visit: Payer: Self-pay

## 2020-11-01 ENCOUNTER — Other Ambulatory Visit: Payer: PPO | Attending: Otolaryngology

## 2020-11-01 DIAGNOSIS — E119 Type 2 diabetes mellitus without complications: Secondary | ICD-10-CM | POA: Diagnosis not present

## 2020-11-01 DIAGNOSIS — Z8616 Personal history of COVID-19: Secondary | ICD-10-CM | POA: Insufficient documentation

## 2020-11-01 DIAGNOSIS — I10 Essential (primary) hypertension: Secondary | ICD-10-CM | POA: Diagnosis not present

## 2020-11-01 DIAGNOSIS — Z23 Encounter for immunization: Secondary | ICD-10-CM | POA: Diagnosis not present

## 2020-11-01 DIAGNOSIS — Z794 Long term (current) use of insulin: Secondary | ICD-10-CM | POA: Diagnosis not present

## 2020-11-01 DIAGNOSIS — E7800 Pure hypercholesterolemia, unspecified: Secondary | ICD-10-CM | POA: Diagnosis not present

## 2020-11-01 NOTE — Anesthesia Preprocedure Evaluation (Addendum)
Anesthesia Evaluation  Patient identified by MRN, date of birth, ID band Patient awake    Reviewed: Allergy & Precautions, H&P , NPO status , Patient's Chart, lab work & pertinent test results  Airway Mallampati: II  TM Distance: >3 FB Neck ROM: full    Dental  (+) Edentulous Upper, Edentulous Lower   Pulmonary former smoker,    Pulmonary exam normal breath sounds clear to auscultation       Cardiovascular hypertension, Normal cardiovascular exam Rhythm:regular Rate:Normal     Neuro/Psych    GI/Hepatic   Endo/Other  diabetes, Poorly Controlled, Insulin Dependent  Renal/GU      Musculoskeletal   Abdominal   Peds  Hematology   Anesthesia Other Findings   Reproductive/Obstetrics                            Anesthesia Physical Anesthesia Plan  ASA: III  Anesthesia Plan: General ETT   Post-op Pain Management:    Induction:   PONV Risk Score and Plan: 2 and Treatment may vary due to age or medical condition, Ondansetron and Dexamethasone  Airway Management Planned:   Additional Equipment:   Intra-op Plan:   Post-operative Plan:   Informed Consent: I have reviewed the patients History and Physical, chart, labs and discussed the procedure including the risks, benefits and alternatives for the proposed anesthesia with the patient or authorized representative who has indicated his/her understanding and acceptance.     Dental Advisory Given  Plan Discussed with: CRNA  Anesthesia Plan Comments:         Anesthesia Quick Evaluation

## 2020-11-04 NOTE — Discharge Instructions (Signed)
General Anesthesia, Adult, Care After This sheet gives you information about how to care for yourself after your procedure. Your health care provider may also give you more specific instructions. If you have problems or questions, contact your health care provider. What can I expect after the procedure? After the procedure, the following side effects are common:  Pain or discomfort at the IV site.  Nausea.  Vomiting.  Sore throat.  Trouble concentrating.  Feeling cold or chills.  Weak or tired.  Sleepiness and fatigue.  Soreness and body aches. These side effects can affect parts of the body that were not involved in surgery. Follow these instructions at home:  For at least 24 hours after the procedure:  Have a responsible adult stay with you. It is important to have someone help care for you until you are awake and alert.  Rest as needed.  Do not: ? Participate in activities in which you could fall or become injured. ? Drive. ? Use heavy machinery. ? Drink alcohol. ? Take sleeping pills or medicines that cause drowsiness. ? Make important decisions or sign legal documents. ? Take care of children on your own. Eating and drinking  Follow any instructions from your health care provider about eating or drinking restrictions.  When you feel hungry, start by eating small amounts of foods that are soft and easy to digest (bland), such as toast. Gradually return to your regular diet.  Drink enough fluid to keep your urine pale yellow.  If you vomit, rehydrate by drinking water, juice, or clear broth. General instructions  If you have sleep apnea, surgery and certain medicines can increase your risk for breathing problems. Follow instructions from your health care provider about wearing your sleep device: ? Anytime you are sleeping, including during daytime naps. ? While taking prescription pain medicines, sleeping medicines, or medicines that make you drowsy.  Return to  your normal activities as told by your health care provider. Ask your health care provider what activities are safe for you.  Take over-the-counter and prescription medicines only as told by your health care provider.  If you smoke, do not smoke without supervision.  Keep all follow-up visits as told by your health care provider. This is important. Contact a health care provider if:  You have nausea or vomiting that does not get better with medicine.  You cannot eat or drink without vomiting.  You have pain that does not get better with medicine.  You are unable to pass urine.  You develop a skin rash.  You have a fever.  You have redness around your IV site that gets worse. Get help right away if:  You have difficulty breathing.  You have chest pain.  You have blood in your urine or stool, or you vomit blood. Summary  After the procedure, it is common to have a sore throat or nausea. It is also common to feel tired.  Have a responsible adult stay with you for the first 24 hours after general anesthesia. It is important to have someone help care for you until you are awake and alert.  When you feel hungry, start by eating small amounts of foods that are soft and easy to digest (bland), such as toast. Gradually return to your regular diet.  Drink enough fluid to keep your urine pale yellow.  Return to your normal activities as told by your health care provider. Ask your health care provider what activities are safe for you. This information is not   intended to replace advice given to you by your health care provider. Make sure you discuss any questions you have with your health care provider. Document Revised: 12/17/2017 Document Reviewed: 07/30/2017 Elsevier Patient Education  2020 [employer]  

## 2020-11-05 ENCOUNTER — Encounter: Payer: Self-pay | Admitting: Otolaryngology

## 2020-11-05 ENCOUNTER — Encounter
Admission: RE | Disposition: A | Discharge status: Home / Self Care | Payer: Self-pay | Source: Home / Self Care | Attending: Otolaryngology

## 2020-11-05 ENCOUNTER — Ambulatory Visit
Admission: RE | Admit: 2020-11-05 | Discharge: 2020-11-05 | Disposition: A | Discharge status: Home / Self Care | Payer: PPO | Attending: Otolaryngology | Admitting: Otolaryngology

## 2020-11-05 ENCOUNTER — Ambulatory Visit: Payer: PPO | Admitting: Anesthesiology

## 2020-11-05 ENCOUNTER — Other Ambulatory Visit: Payer: Self-pay

## 2020-11-05 DIAGNOSIS — Z8551 Personal history of malignant neoplasm of bladder: Secondary | ICD-10-CM | POA: Diagnosis not present

## 2020-11-05 DIAGNOSIS — Z7982 Long term (current) use of aspirin: Secondary | ICD-10-CM | POA: Diagnosis not present

## 2020-11-05 DIAGNOSIS — Z87891 Personal history of nicotine dependence: Secondary | ICD-10-CM | POA: Diagnosis not present

## 2020-11-05 DIAGNOSIS — R1310 Dysphagia, unspecified: Secondary | ICD-10-CM | POA: Diagnosis not present

## 2020-11-05 DIAGNOSIS — D380 Neoplasm of uncertain behavior of larynx: Secondary | ICD-10-CM | POA: Diagnosis not present

## 2020-11-05 DIAGNOSIS — Z8521 Personal history of malignant neoplasm of larynx: Secondary | ICD-10-CM | POA: Diagnosis not present

## 2020-11-05 DIAGNOSIS — Z794 Long term (current) use of insulin: Secondary | ICD-10-CM | POA: Insufficient documentation

## 2020-11-05 DIAGNOSIS — R1319 Other dysphagia: Secondary | ICD-10-CM | POA: Diagnosis not present

## 2020-11-05 DIAGNOSIS — Z79899 Other long term (current) drug therapy: Secondary | ICD-10-CM | POA: Insufficient documentation

## 2020-11-05 DIAGNOSIS — C321 Malignant neoplasm of supraglottis: Secondary | ICD-10-CM | POA: Insufficient documentation

## 2020-11-05 DIAGNOSIS — Z85528 Personal history of other malignant neoplasm of kidney: Secondary | ICD-10-CM | POA: Diagnosis not present

## 2020-11-05 DIAGNOSIS — C329 Malignant neoplasm of larynx, unspecified: Secondary | ICD-10-CM | POA: Diagnosis not present

## 2020-11-05 HISTORY — PX: MICROLARYNGOSCOPY: SHX5208

## 2020-11-05 HISTORY — DX: Unspecified hearing loss, unspecified ear: H91.90

## 2020-11-05 LAB — GLUCOSE, CAPILLARY
Glucose-Capillary: 267 mg/dL — ABNORMAL HIGH (ref 70–99)
Glucose-Capillary: 233 mg/dL — ABNORMAL HIGH (ref 70–99)

## 2020-11-05 SURGERY — MICROLARYNGOSCOPY
Anesthesia: General | Site: Throat

## 2020-11-05 MED ORDER — LACTATED RINGERS IV SOLN: INTRAVENOUS | Status: DC

## 2020-11-05 MED ORDER — ACETAMINOPHEN 325 MG PO TABS: 325.0000 mg | ORAL_TABLET | ORAL | Status: DC | PRN

## 2020-11-05 MED ORDER — SUCCINYLCHOLINE CHLORIDE 20 MG/ML IJ SOLN
INTRAMUSCULAR | Status: DC | PRN
  Administered 2020-11-05: 100 mg via INTRAVENOUS

## 2020-11-05 MED ORDER — PROPOFOL 10 MG/ML IV BOLUS
INTRAVENOUS | Status: DC | PRN
  Administered 2020-11-05: 100 mg via INTRAVENOUS

## 2020-11-05 MED ORDER — MIDAZOLAM HCL 5 MG/5ML IJ SOLN
INTRAMUSCULAR | Status: DC | PRN
  Administered 2020-11-05: 1 mg via INTRAVENOUS

## 2020-11-05 MED ORDER — OXYMETAZOLINE HCL 0.05 % NA SOLN
NASAL | Status: DC | PRN
  Administered 2020-11-05: 1 via TOPICAL

## 2020-11-05 MED ORDER — DEXAMETHASONE SODIUM PHOSPHATE 4 MG/ML IJ SOLN
INTRAMUSCULAR | Status: DC | PRN
  Administered 2020-11-05: 10 mg via INTRAVENOUS

## 2020-11-05 MED ORDER — LIDOCAINE HCL (CARDIAC) PF 100 MG/5ML IV SOSY
PREFILLED_SYRINGE | INTRAVENOUS | Status: DC | PRN
  Administered 2020-11-05: 50 mg via INTRAVENOUS

## 2020-11-05 MED ORDER — FENTANYL CITRATE (PF) 100 MCG/2ML IJ SOLN: 25.0000 ug | INTRAMUSCULAR | Status: DC | PRN

## 2020-11-05 MED ORDER — FENTANYL CITRATE (PF) 100 MCG/2ML IJ SOLN
INTRAMUSCULAR | Status: DC | PRN
  Administered 2020-11-05 (×2): 25 ug via INTRAVENOUS
  Administered 2020-11-05: 50 ug via INTRAVENOUS
  Administered 2020-11-05: 25 ug via INTRAVENOUS

## 2020-11-05 MED ORDER — ONDANSETRON HCL 4 MG/2ML IJ SOLN
INTRAMUSCULAR | Status: DC | PRN
  Administered 2020-11-05: 4 mg via INTRAVENOUS

## 2020-11-05 MED ORDER — OXYCODONE HCL 5 MG/5ML PO SOLN
5.0000 mg | Freq: Once | ORAL | Status: AC | PRN
  Administered 2020-11-05: 5 mg via ORAL

## 2020-11-05 MED ORDER — HYDROCODONE-ACETAMINOPHEN 7.5-325 MG/15ML PO SOLN
ORAL | 0 refills | Status: DC
Start: 2020-11-05 — End: 2021-08-14

## 2020-11-05 MED ORDER — ONDANSETRON HCL 4 MG/2ML IJ SOLN: 4.0000 mg | Freq: Once | INTRAMUSCULAR | Status: DC | PRN

## 2020-11-05 MED ORDER — ACETAMINOPHEN 160 MG/5ML PO SOLN: 325.0000 mg | ORAL | Status: DC | PRN

## 2020-11-05 MED ORDER — 0.9 % SODIUM CHLORIDE (POUR BTL) OPTIME
TOPICAL | Status: DC | PRN
  Administered 2020-11-05: 500 mL

## 2020-11-05 MED ORDER — OXYCODONE HCL 5 MG PO TABS: 5.0000 mg | ORAL_TABLET | Freq: Once | ORAL | Status: AC | PRN

## 2020-11-05 SURGICAL SUPPLY — 14 items
BLOCK BITE GUARD (MISCELLANEOUS) ×3 IMPLANT
COVER MAYO STAND STRL (DRAPES) ×3 IMPLANT
COVER TABLE BACK 60X90 (DRAPES) ×3 IMPLANT
CUP MEDICINE 2OZ PLAST GRAD ST (MISCELLANEOUS) ×3 IMPLANT
DRSG TELFA 4X3 1S NADH ST (GAUZE/BANDAGES/DRESSINGS) ×3 IMPLANT
GLOVE BIO SURGEON STRL SZ7.5 (GLOVE) ×3 IMPLANT
KIT TURNOVER KIT A (KITS) ×3 IMPLANT
MARKER SKIN DUAL TIP RULER LAB (MISCELLANEOUS) IMPLANT
NEEDLE 18GX1X1/2 (RX/OR ONLY) (NEEDLE) IMPLANT
PATTIES SURGICAL .5 X.5 (GAUZE/BANDAGES/DRESSINGS) ×3 IMPLANT
SPONGE XRAY 4X4 16PLY STRL (MISCELLANEOUS) ×3 IMPLANT
TOWEL OR 17X26 4PK STRL BLUE (TOWEL DISPOSABLE) ×3 IMPLANT
TUBING CONN 6MMX3.1M (TUBING) ×2
TUBING SUCTION CONN 0.25 STRL (TUBING) ×1 IMPLANT

## 2020-11-05 NOTE — Anesthesia Postprocedure Evaluation (Signed)
Anesthesia Post Note  Patient: [name]  Procedure(s) Performed: MICROLARYNGOSCOPY WITH BIOPSIES (N/A Throat)     Patient location during evaluation: PACU Anesthesia Type: General Level of consciousness: awake and alert and oriented Pain management: satisfactory to patient Vital Signs Assessment: post-procedure vital signs reviewed and stable Respiratory status: spontaneous breathing, nonlabored ventilation and respiratory function stable Cardiovascular status: blood pressure returned to baseline and stable Postop Assessment: Adequate PO intake and No signs of nausea or vomiting Anesthetic complications: no   No complications documented.  Raliegh Ip

## 2020-11-05 NOTE — H&P (Signed)
History and physical reviewed and will be scanned in later. No change in medical status reported by the patient or family, appears stable for surgery. All questions regarding the procedure answered, and patient (or family if a child) expressed understanding of the procedure. ? ?[name] S [name] ?@TODAY@ ?

## 2020-11-05 NOTE — Transfer of Care (Signed)
Immediate Anesthesia Transfer of Care Note  Patient: [name]  Procedure(s) Performed: MICROLARYNGOSCOPY WITH BIOPSIES (N/A Throat)  Patient Location: PACU  Anesthesia Type: General ETT  Level of Consciousness: awake, alert  and patient cooperative  Airway and Oxygen Therapy: Patient Spontanous Breathing and Patient connected to supplemental oxygen  Post-op Assessment: Post-op Vital signs reviewed, Patient's Cardiovascular Status Stable, Respiratory Function Stable, Patent Airway and No signs of Nausea or vomiting  Post-op Vital Signs: Reviewed and stable  Complications: No complications documented.

## 2020-11-05 NOTE — Op Note (Signed)
11/05/2020  11:20 AM    [name]  [id number]    Pre-Op Diagnosis:  Dysphagia, laryngeal lesion with personal history of malignant neoplasm of larynx  Post-op Diagnosis: dysphagia, laryngeal lesion with personal history of malignant neoplasm of larynx  Procedure:  Microlaryngoscopy with Biopsy  Surgeon:  Riley Nearing., MD  Anesthesia:  General Endotracheal  EBL:  minimal  Complications:  None  Findings:  Post-surgical changes from prior supraglottic laryngectomy. Friable lesion of the right FVC extending to the region of the petiole of the epiglottis remnant. Some thickening of the right TVC.  Procedure: With the patient in a comfortable supine position, general endotracheal anesthesia was induced without difficulty.  At an appropriate level, the table was turned 90 degrees away from Anesthesia.  A clean preparation and draping was performed in the standard fashion.   A tooth guard was placed to protect the gums. Using the Dedo laryngoscope, the oropharynx, hypopharynx and larynx were carefully inspected. There were no lesions of the tongue base and the piriform sinuses were carefully inspected with no lesions noted, however there was a friable lesion of the right supraglottic larynx. Photo documentation was obtained with the 0 degree scope. The patient was placed into suspension and the larynx further inspected using the operating microscope.  Biopsies were taken from the right supraglottis including the FVC and the remnant of the epiglottis, where the lesion extended along the laryngeal surface down to the petiole region. The lesion clearly appeared to be an invasive neoplasm, some of which had been obscured from view in clinic due to post-surgical scarring of the supraglottis. Bleeding was controlled with Afrin moistened pledgets.  The findings were as described above.  The laryngoscope was removed.   At this point the procedure was completed.  Dental status was  intact.  The patient was returned to Anesthesia, awakened, extubated, and transferred to PACU in satisfactory condition.   Disposition: To PACU, then discharge home  Plan: Soft, bland diet, advance as tolerated. Take pain medications as prescribed. Follow-up in 3 weeks.  Riley Nearing 11/05/2020 11:20 AM

## 2020-11-05 NOTE — Anesthesia Procedure Notes (Signed)
Procedure Name: Intubation Date/Time: 11/05/2020 10:44 AM Performed by: Mayme Genta, CRNA Pre-anesthesia Checklist: Patient identified, Emergency Drugs available, Suction available, Patient being monitored and Timeout performed Patient Re-evaluated:Patient Re-evaluated prior to induction Oxygen Delivery Method: Circle system utilized Preoxygenation: Pre-oxygenation with 100% oxygen Induction Type: IV induction Ventilation: Mask ventilation without difficulty Laryngoscope Size: Miller and 3 Grade View: Grade I Tube type: MLT Tube size: 6.0 mm Number of attempts: 1 Placement Confirmation: ETT inserted through vocal cords under direct vision,  positive ETCO2 and breath sounds checked- equal and bilateral Secured at: 23 cm Tube secured with: Tape Dental Injury: Teeth and Oropharynx as per pre-operative assessment

## 2020-11-06 ENCOUNTER — Encounter: Payer: Self-pay | Admitting: Otolaryngology

## 2020-11-07 ENCOUNTER — Other Ambulatory Visit: Payer: Self-pay | Admitting: Anatomic Pathology & Clinical Pathology

## 2020-11-07 LAB — SURGICAL PATHOLOGY

## 2020-11-14 ENCOUNTER — Other Ambulatory Visit: Payer: PPO

## 2020-11-15 ENCOUNTER — Ambulatory Visit
Admission: RE | Admit: 2020-11-15 | Discharge: 2020-11-15 | Disposition: A | Discharge status: Home / Self Care | Payer: PPO | Source: Ambulatory Visit | Attending: Radiation Oncology | Admitting: Radiation Oncology

## 2020-11-15 ENCOUNTER — Other Ambulatory Visit: Payer: Self-pay | Admitting: *Deleted

## 2020-11-15 ENCOUNTER — Other Ambulatory Visit: Payer: Self-pay

## 2020-11-15 ENCOUNTER — Encounter: Payer: Self-pay | Admitting: Radiation Oncology

## 2020-11-15 DIAGNOSIS — E119 Type 2 diabetes mellitus without complications: Secondary | ICD-10-CM | POA: Insufficient documentation

## 2020-11-15 DIAGNOSIS — C329 Malignant neoplasm of larynx, unspecified: Secondary | ICD-10-CM

## 2020-11-15 DIAGNOSIS — E669 Obesity, unspecified: Secondary | ICD-10-CM | POA: Diagnosis not present

## 2020-11-15 DIAGNOSIS — Z87891 Personal history of nicotine dependence: Secondary | ICD-10-CM | POA: Insufficient documentation

## 2020-11-15 DIAGNOSIS — Z6833 Body mass index (BMI) 33.0-33.9, adult: Secondary | ICD-10-CM | POA: Insufficient documentation

## 2020-11-15 DIAGNOSIS — Z794 Long term (current) use of insulin: Secondary | ICD-10-CM | POA: Insufficient documentation

## 2020-11-15 DIAGNOSIS — Z85528 Personal history of other malignant neoplasm of kidney: Secondary | ICD-10-CM | POA: Diagnosis not present

## 2020-11-15 DIAGNOSIS — Z8551 Personal history of malignant neoplasm of bladder: Secondary | ICD-10-CM | POA: Diagnosis not present

## 2020-11-15 DIAGNOSIS — I10 Essential (primary) hypertension: Secondary | ICD-10-CM | POA: Diagnosis not present

## 2020-11-15 DIAGNOSIS — Z79899 Other long term (current) drug therapy: Secondary | ICD-10-CM | POA: Insufficient documentation

## 2020-11-15 DIAGNOSIS — Z8616 Personal history of COVID-19: Secondary | ICD-10-CM | POA: Insufficient documentation

## 2020-11-15 NOTE — Consult Note (Signed)
NEW PATIENT EVALUATION  Name: [name]  MRN: [id number]  Date:   11/15/2020     DOB: 08-Feb-1946   This 74 y.o. 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  REFERRING PHYSICIAN: Dion Body, MD  CHIEF COMPLAINT:  Chief Complaint  Patient presents with  . Cancer    initial consultation    DIAGNOSIS: The encounter diagnosis was Larynx cancer ([place]).   PREVIOUS INVESTIGATIONS:  Pathology report reviewed CT scan of the head and neck reviewed PET CT scan ordered Clinical notes reviewed   HPI: Patient is a 74 year old male status post supraglottic laryngectomy back in 1990 for presumed squamous cell carcinoma.  Patient has been having some increased dysphagia upper endoscopy has been negative.  He was seen by Dr. Richardson Landry noted to have a friable lesion of the right false vocal cord extending to the region of the epiglottis remanent.  There is also some thickening of the right true vocal cord.  He underwent biopsy which was positive for invasive moderately differentiated squamous cell carcinoma.  Patient had a CT scan back in October showing no acute abnormality in the neck did have contour irregularity anterior supraglottic larynx which may be sequela of prior surgery.  Seen today for radiation oncology opinion.  He still has some dysphagia although no weight loss.  He is otherwise doing well with no head and neck pain.  PLANNED TREATMENT REGIMEN: External beam radiation therapy  PAST MEDICAL HISTORY:  has a past medical history of Back pain, Bladder cancer ([place]), COVID-19 (09/10/2020), Diabetes mellitus without complication ([place]), HOH (hard of hearing), Hypertension, Obesity, Renal cancer ([place]), Renal cell cancer ([place]), Throat cancer ([place]), and Tremor.    PAST SURGICAL HISTORY:  Past Surgical History:  Procedure Laterality Date  .  APPENDECTOMY    . BLADDER SURGERY    . CERVICAL SPINE SURGERY    . CHOLECYSTECTOMY N/A 03/23/2018   Procedure: LAPAROSCOPIC CHOLECYSTECTOMY;  Surgeon: Herbert Pun, MD;  Location: [name] ORS;  Service: General;  Laterality: N/A;  . COLONOSCOPY    . COLONOSCOPY WITH PROPOFOL N/A 11/10/2017   Procedure: COLONOSCOPY WITH PROPOFOL;  Surgeon: Toledo, Benay Pike, MD;  Location: [name] END[facility]COPY;  Service: Gastroenterology;  Laterality: N/A;  . ESOPHAGOGASTRODUODENOSCOPY (EGD) WITH PROPOFOL N/A 11/10/2017   Procedure: ESOPHAGOGASTRODUODENOSCOPY (EGD) WITH PROPOFOL;  Surgeon: Toledo, Benay Pike, MD;  Location: [name] END[facility];  Service: Gastroenterology;  Laterality: N/A;  . HERNIA REPAIR    . left od surgery    . MICROLARYNGOSCOPY N/A 11/05/2020   Procedure: MICROLARYNGOSCOPY WITH BIOPSIES;  Surgeon: Clyde Canterbury, MD;  Location: [facility];  Service: ENT;  Laterality: N/A;  . NEPHRECTOMY     right kidney nephrectomy  . rt elbow surgery    . THROAT SURGERY  1990   for cancer removal at [place]: family history includes CAD in his brother and brother; Diabetes in his brother; Hypertension in his brother.  SOCIAL HISTORY:  reports that he quit smoking about 30 years ago. He has never used smokeless tobacco. He reports current alcohol use. He reports that he does not use drugs.  ALLERGIES: Metformin  MEDICATIONS:  Current Outpatient Medications  Medication Sig Dispense Refill  . amitriptyline (ELAVIL) 25 MG tablet Take 75 mg by mouth at bedtime.     . Ascorbic Acid (VITAMIN C PO) Take by mouth daily.    Marland Kitchen  atorvastatin (LIPITOR) 40 MG tablet Take 40 mg daily by mouth.    . B COMPLEX-C PO Take by mouth daily.    . fluticasone (FLONASE) 50 MCG/ACT nasal spray Place 2 sprays into both nostrils daily.     . furosemide (LASIX) 20 MG tablet Take 20 mg by mouth daily as needed for edema.    Marland Kitchen glimepiride (AMARYL) 4 MG tablet Take 4 mg by mouth 2 (two) times daily.      . hydrochlorothiazide (HYDRODIURIL) 25 MG tablet Take 25 mg daily by mouth.    . insulin NPH-regular Human (NOVOLIN 70/30) (70-30) 100 UNIT/ML injection Inject 45 Units into the skin 2 (two) times daily with a meal.     . lisinopril (PRINIVIL,ZESTRIL) 10 MG tablet Take 10 mg daily by mouth.    . Multiple Vitamins-Minerals (ZINC PO) Take by mouth daily.    Marland Kitchen NIFEdipine (PROCARDIA-XL/ADALAT-CC/NIFEDICAL-XL) 30 MG 24 hr tablet Take 30 mg daily by mouth.    . propranolol (INDERAL) 80 MG tablet Take by mouth daily.     . tamsulosin (FLOMAX) 0.4 MG CAPS capsule Take 0.4 mg by mouth daily after breakfast.    . TURMERIC-GINGER PO Take by mouth daily.    Marland Kitchen VITAMIN D PO Take by mouth to Post Anesthesia Care Unit.    Marland Kitchen acetaminophen (TYLENOL) 650 MG CR tablet Take 650 mg every 8 (eight) hours as needed by mouth for pain.    Marland Kitchen HYDROcodone-acetaminophen (HYCET) 7.5-325 mg/15 ml solution 10-15cc PO every 4-6 hours as needed for pain (Patient not taking: Reported on 11/15/2020) 200 mL 0   No current facility-administered medications for this encounter.    ECOG PERFORMANCE STATUS:  1 - Symptomatic but completely ambulatory  REVIEW OF SYSTEMS: Except further dysphagia. Patient denies any weight loss, fatigue, weakness, fever, chills or night sweats. Patient denies any loss of vision, blurred vision. Patient denies any ringing  of the ears or hearing loss. No irregular heartbeat. Patient denies heart murmur or history of fainting. Patient denies any chest pain or pain radiating to her upper extremities. Patient denies any shortness of breath, difficulty breathing at night, cough or hemoptysis. Patient denies any swelling in the lower legs. Patient denies any nausea vomiting, vomiting of blood, or coffee ground material in the vomitus. Patient denies any stomach pain. Patient states has had normal bowel movements no significant constipation or diarrhea. Patient denies any dysuria, hematuria or significant nocturia.  Patient denies any problems walking, swelling in the joints or loss of balance. Patient denies any skin changes, loss of hair or loss of weight. Patient denies any excessive worrying or anxiety or significant depression. Patient denies any problems with insomnia. Patient denies excessive thirst, polyuria, polydipsia. Patient denies any swollen glands, patient denies easy bruising or easy bleeding. Patient denies any recent infections, allergies or URI. Patient "s visual fields have not changed significantly in recent time. PHYSICAL EXAM: BP (P) 123/64 (BP Location: Left Arm, Patient Position: Sitting)   Pulse (P) 67   Temp (!) (P) 97 F (36.1 C) (Tympanic)   Resp (P) 16   Wt (P) 244 lb 14.4 oz (111.1 kg)   BMI (P) 33.21 kg/m  Patient does have some sequela of prior head and neck surgery.  No evidence of cervical or supraclavicular adenopathy is identified.  Well-developed well-nourished patient in NAD. HEENT reveals PERLA, EOMI, discs not visualized.  Oral cavity is clear. No oral mucosal lesions are identified. Neck is clear without evidence of cervical or supraclavicular adenopathy.  Lungs are clear to A&P. Cardiac examination is essentially unremarkable with regular rate and rhythm without murmur rub or thrill. Abdomen is benign with no organomegaly or masses noted. Motor sensory and DTR levels are equal and symmetric in the upper and lower extremities. Cranial nerves II through XII are grossly intact. Proprioception is intact. No peripheral adenopathy or edema is identified. No motor or sensory levels are noted. Crude visual fields are within normal range.  LABORATORY DATA: Pathology report reviewed    RADIOLOGY RESULTS: CT scan head and neck reviewed PET CT scan ordered   IMPRESSION: Probable stage I squamous cell carcinoma of the larynx and patient with prior history of supraglottic laryngectomy in 1990 for squamous cell carcinoma in 74 year old male  PLAN: At this time to better delineate  exact areas of tumor involvement I have ordered a PET CT scan of the head and neck.  Based on his prior surgery and recent biopsy would like to rule out any possibility of head and neck adenopathy or extent of disease to tailor my treatment fields.  Risks and benefits of radiation including possible worsening of his dysphagia skin reaction fatigue alteration of blood counts and increased hoarseness all were described in detail to the patient.  He comprehends my treatment plan well.  I have also set up a CT simulation after PET CT scan is performed.  I would like to take this opportunity to thank you for allowing me to participate in the care of your patient.Noreene Filbert, MD

## 2020-11-15 NOTE — Progress Notes (Signed)
Tumor Board Documentation  [name] was presented by Dr Rogue Bussing at our Tumor Board on 11/14/2020, which included representatives from medical oncology, radiation oncology, surgical oncology, internal medicine, navigation, pathology, radiology, surgical, pharmacy, genetics, research, palliative care, pulmonology.  [name] currently presents as an external consult, for [name], for new positive pathology with history of the following treatments: surgical intervention(s).  Additionally, we reviewed previous medical and familial history, history of present illness, and recent lab results along with all available histopathologic and imaging studies. The tumor board considered available treatment options and made the following recommendations: Radiation therapy (primary modality), Additional screening (PET Scan)    The following procedures/referrals were also placed: No orders of the defined types were placed in this encounter.   Clinical Trial Status: not discussed   Staging used: AJCC Stage Group  AJCC Staging:       Group: Squamous Cell Carcinoma or Right False Vocal Cord, Invasive, Moderately differentiated   National site-specific guidelines NCCN were discussed with respect to the case.  Tumor board is a meeting of clinicians from various specialty areas who evaluate and discuss patients for whom a multidisciplinary approach is being considered. Final determinations in the plan of care are those of the provider(s). The responsibility for follow up of recommendations given during tumor board is that of the provider.   Today's extended care, comprehensive team conference, [name] was not present for the discussion and was not examined.   Multidisciplinary Tumor Board is a multidisciplinary case peer review process.  Decisions discussed in the Multidisciplinary Tumor Board reflect the opinions of the specialists present at the conference without having examined the patient.  Ultimately,  treatment and diagnostic decisions rest with the primary provider(s) and the patient.

## 2020-11-18 ENCOUNTER — Institutional Professional Consult (permissible substitution): Payer: PPO | Admitting: Radiation Oncology

## 2020-11-20 ENCOUNTER — Other Ambulatory Visit: Payer: Self-pay

## 2020-11-20 ENCOUNTER — Ambulatory Visit
Admission: RE | Admit: 2020-11-20 | Discharge: 2020-11-20 | Disposition: A | Discharge status: Home / Self Care | Payer: PPO | Source: Ambulatory Visit | Attending: Radiation Oncology | Admitting: Radiation Oncology

## 2020-11-20 DIAGNOSIS — I2510 Atherosclerotic heart disease of native coronary artery without angina pectoris: Secondary | ICD-10-CM | POA: Diagnosis not present

## 2020-11-20 DIAGNOSIS — C329 Malignant neoplasm of larynx, unspecified: Secondary | ICD-10-CM

## 2020-11-20 DIAGNOSIS — K439 Ventral hernia without obstruction or gangrene: Secondary | ICD-10-CM | POA: Diagnosis not present

## 2020-11-20 DIAGNOSIS — K469 Unspecified abdominal hernia without obstruction or gangrene: Secondary | ICD-10-CM | POA: Diagnosis not present

## 2020-11-20 LAB — GLUCOSE, CAPILLARY: Glucose-Capillary: 171 mg/dL — ABNORMAL HIGH (ref 70–99)

## 2020-11-20 MED ORDER — FLUDEOXYGLUCOSE F - 18 (FDG) INJECTION
8.2100 | Freq: Once | INTRAVENOUS | Status: AC | PRN
  Administered 2020-11-20: 13.4 via INTRAVENOUS

## 2020-11-25 ENCOUNTER — Ambulatory Visit
Admission: RE | Admit: 2020-11-25 | Discharge: 2020-11-25 | Disposition: A | Discharge status: Home / Self Care | Payer: PPO | Source: Ambulatory Visit | Attending: Radiation Oncology | Admitting: Radiation Oncology

## 2020-11-25 DIAGNOSIS — C329 Malignant neoplasm of larynx, unspecified: Secondary | ICD-10-CM | POA: Diagnosis not present

## 2020-11-27 DIAGNOSIS — C329 Malignant neoplasm of larynx, unspecified: Secondary | ICD-10-CM | POA: Insufficient documentation

## 2020-11-29 ENCOUNTER — Other Ambulatory Visit: Payer: Self-pay | Admitting: *Deleted

## 2020-11-29 DIAGNOSIS — C329 Malignant neoplasm of larynx, unspecified: Secondary | ICD-10-CM

## 2020-12-02 ENCOUNTER — Ambulatory Visit: Admission: RE | Admit: 2020-12-02 | Payer: PPO | Source: Ambulatory Visit

## 2020-12-02 DIAGNOSIS — C329 Malignant neoplasm of larynx, unspecified: Secondary | ICD-10-CM | POA: Diagnosis not present

## 2020-12-03 ENCOUNTER — Ambulatory Visit
Admission: RE | Admit: 2020-12-03 | Discharge: 2020-12-03 | Disposition: A | Discharge status: Home / Self Care | Payer: PPO | Source: Ambulatory Visit | Attending: Radiation Oncology | Admitting: Radiation Oncology

## 2020-12-03 DIAGNOSIS — G250 Essential tremor: Secondary | ICD-10-CM | POA: Diagnosis not present

## 2020-12-03 DIAGNOSIS — C329 Malignant neoplasm of larynx, unspecified: Secondary | ICD-10-CM | POA: Diagnosis not present

## 2020-12-04 ENCOUNTER — Ambulatory Visit
Admission: RE | Admit: 2020-12-04 | Discharge: 2020-12-04 | Disposition: A | Discharge status: Home / Self Care | Payer: PPO | Source: Ambulatory Visit | Attending: Radiation Oncology | Admitting: Radiation Oncology

## 2020-12-04 DIAGNOSIS — C329 Malignant neoplasm of larynx, unspecified: Secondary | ICD-10-CM | POA: Diagnosis not present

## 2020-12-05 ENCOUNTER — Ambulatory Visit
Admission: RE | Admit: 2020-12-05 | Discharge: 2020-12-05 | Disposition: A | Discharge status: Home / Self Care | Payer: PPO | Source: Ambulatory Visit | Attending: Radiation Oncology | Admitting: Radiation Oncology

## 2020-12-05 DIAGNOSIS — C329 Malignant neoplasm of larynx, unspecified: Secondary | ICD-10-CM | POA: Diagnosis not present

## 2020-12-06 ENCOUNTER — Ambulatory Visit
Admission: RE | Admit: 2020-12-06 | Discharge: 2020-12-06 | Disposition: A | Discharge status: Home / Self Care | Payer: PPO | Source: Ambulatory Visit | Attending: Radiation Oncology | Admitting: Radiation Oncology

## 2020-12-06 DIAGNOSIS — C329 Malignant neoplasm of larynx, unspecified: Secondary | ICD-10-CM | POA: Diagnosis not present

## 2020-12-09 ENCOUNTER — Ambulatory Visit
Admission: RE | Admit: 2020-12-09 | Discharge: 2020-12-09 | Disposition: A | Discharge status: Home / Self Care | Payer: PPO | Source: Ambulatory Visit | Attending: Radiation Oncology | Admitting: Radiation Oncology

## 2020-12-09 DIAGNOSIS — C329 Malignant neoplasm of larynx, unspecified: Secondary | ICD-10-CM | POA: Diagnosis not present

## 2020-12-10 ENCOUNTER — Ambulatory Visit
Admission: RE | Admit: 2020-12-10 | Discharge: 2020-12-10 | Disposition: A | Discharge status: Home / Self Care | Payer: PPO | Source: Ambulatory Visit | Attending: Radiation Oncology | Admitting: Radiation Oncology

## 2020-12-10 DIAGNOSIS — C329 Malignant neoplasm of larynx, unspecified: Secondary | ICD-10-CM | POA: Diagnosis not present

## 2020-12-11 ENCOUNTER — Ambulatory Visit
Admission: RE | Admit: 2020-12-11 | Discharge: 2020-12-11 | Disposition: A | Discharge status: Home / Self Care | Payer: PPO | Source: Ambulatory Visit | Attending: Radiation Oncology | Admitting: Radiation Oncology

## 2020-12-11 DIAGNOSIS — C329 Malignant neoplasm of larynx, unspecified: Secondary | ICD-10-CM | POA: Diagnosis not present

## 2020-12-12 ENCOUNTER — Ambulatory Visit
Admission: RE | Admit: 2020-12-12 | Discharge: 2020-12-12 | Disposition: A | Discharge status: Home / Self Care | Payer: PPO | Source: Ambulatory Visit | Attending: Radiation Oncology | Admitting: Radiation Oncology

## 2020-12-12 DIAGNOSIS — C329 Malignant neoplasm of larynx, unspecified: Secondary | ICD-10-CM | POA: Diagnosis not present

## 2020-12-13 ENCOUNTER — Ambulatory Visit
Admission: RE | Admit: 2020-12-13 | Discharge: 2020-12-13 | Disposition: A | Discharge status: Home / Self Care | Payer: PPO | Source: Ambulatory Visit | Attending: Radiation Oncology | Admitting: Radiation Oncology

## 2020-12-13 DIAGNOSIS — C329 Malignant neoplasm of larynx, unspecified: Secondary | ICD-10-CM | POA: Diagnosis not present

## 2020-12-16 ENCOUNTER — Ambulatory Visit
Admission: RE | Admit: 2020-12-16 | Discharge: 2020-12-16 | Disposition: A | Discharge status: Home / Self Care | Payer: PPO | Source: Ambulatory Visit | Attending: Radiation Oncology | Admitting: Radiation Oncology

## 2020-12-16 ENCOUNTER — Other Ambulatory Visit: Payer: Self-pay

## 2020-12-16 ENCOUNTER — Inpatient Hospital Stay: Payer: PPO | Attending: Radiation Oncology

## 2020-12-16 DIAGNOSIS — C329 Malignant neoplasm of larynx, unspecified: Secondary | ICD-10-CM | POA: Diagnosis not present

## 2020-12-16 LAB — CBC
HCT: 44.8 % (ref 39.0–52.0)
Hemoglobin: 14.5 g/dL (ref 13.0–17.0)
MCH: 26.5 pg (ref 26.0–34.0)
MCHC: 32.4 g/dL (ref 30.0–36.0)
MCV: 81.8 fL (ref 80.0–100.0)
Platelets: 153 10*3/uL (ref 150–400)
RBC: 5.48 MIL/uL (ref 4.22–5.81)
RDW: 15.3 % (ref 11.5–15.5)
WBC: 8.2 10*3/uL (ref 4.0–10.5)
nRBC: 0 % (ref 0.0–0.2)

## 2020-12-17 ENCOUNTER — Ambulatory Visit
Admission: RE | Admit: 2020-12-17 | Discharge: 2020-12-17 | Disposition: A | Discharge status: Home / Self Care | Payer: PPO | Source: Ambulatory Visit | Attending: Radiation Oncology | Admitting: Radiation Oncology

## 2020-12-17 DIAGNOSIS — C329 Malignant neoplasm of larynx, unspecified: Secondary | ICD-10-CM | POA: Diagnosis not present

## 2020-12-18 ENCOUNTER — Other Ambulatory Visit: Payer: Self-pay | Admitting: *Deleted

## 2020-12-18 ENCOUNTER — Ambulatory Visit
Admission: RE | Admit: 2020-12-18 | Discharge: 2020-12-18 | Disposition: A | Discharge status: Home / Self Care | Payer: PPO | Source: Ambulatory Visit | Attending: Radiation Oncology | Admitting: Radiation Oncology

## 2020-12-18 DIAGNOSIS — C329 Malignant neoplasm of larynx, unspecified: Secondary | ICD-10-CM | POA: Diagnosis not present

## 2020-12-18 MED ORDER — SUCRALFATE 1 G PO TABS: 1.0000 g | ORAL_TABLET | Freq: Three times a day (TID) | ORAL | 1 refills | Status: DC

## 2020-12-19 ENCOUNTER — Ambulatory Visit
Admission: RE | Admit: 2020-12-19 | Discharge: 2020-12-19 | Disposition: A | Discharge status: Home / Self Care | Payer: PPO | Source: Ambulatory Visit | Attending: Radiation Oncology | Admitting: Radiation Oncology

## 2020-12-19 DIAGNOSIS — C329 Malignant neoplasm of larynx, unspecified: Secondary | ICD-10-CM | POA: Diagnosis not present

## 2020-12-23 ENCOUNTER — Ambulatory Visit
Admission: RE | Admit: 2020-12-23 | Discharge: 2020-12-23 | Disposition: A | Discharge status: Home / Self Care | Payer: PPO | Source: Ambulatory Visit | Attending: Radiation Oncology | Admitting: Radiation Oncology

## 2020-12-23 ENCOUNTER — Inpatient Hospital Stay: Payer: PPO

## 2020-12-23 DIAGNOSIS — C329 Malignant neoplasm of larynx, unspecified: Secondary | ICD-10-CM | POA: Diagnosis not present

## 2020-12-23 LAB — CBC
HCT: 46 % (ref 39.0–52.0)
Hemoglobin: 14.7 g/dL (ref 13.0–17.0)
MCH: 25.6 pg — ABNORMAL LOW (ref 26.0–34.0)
MCHC: 32 g/dL (ref 30.0–36.0)
MCV: 80 fL (ref 80.0–100.0)
Platelets: 153 10*3/uL (ref 150–400)
RBC: 5.75 MIL/uL (ref 4.22–5.81)
RDW: 15.2 % (ref 11.5–15.5)
WBC: 8.1 10*3/uL (ref 4.0–10.5)
nRBC: 0 % (ref 0.0–0.2)

## 2020-12-24 ENCOUNTER — Ambulatory Visit
Admission: RE | Admit: 2020-12-24 | Discharge: 2020-12-24 | Disposition: A | Discharge status: Home / Self Care | Payer: PPO | Source: Ambulatory Visit | Attending: Radiation Oncology | Admitting: Radiation Oncology

## 2020-12-24 DIAGNOSIS — C329 Malignant neoplasm of larynx, unspecified: Secondary | ICD-10-CM | POA: Diagnosis not present

## 2020-12-25 ENCOUNTER — Ambulatory Visit
Admission: RE | Admit: 2020-12-25 | Discharge: 2020-12-25 | Disposition: A | Discharge status: Home / Self Care | Payer: PPO | Source: Ambulatory Visit | Attending: Radiation Oncology | Admitting: Radiation Oncology

## 2020-12-25 DIAGNOSIS — C329 Malignant neoplasm of larynx, unspecified: Secondary | ICD-10-CM | POA: Diagnosis not present

## 2020-12-26 ENCOUNTER — Ambulatory Visit
Admission: RE | Admit: 2020-12-26 | Discharge: 2020-12-26 | Disposition: A | Discharge status: Home / Self Care | Payer: PPO | Source: Ambulatory Visit | Attending: Radiation Oncology | Admitting: Radiation Oncology

## 2020-12-26 DIAGNOSIS — C329 Malignant neoplasm of larynx, unspecified: Secondary | ICD-10-CM | POA: Diagnosis not present

## 2020-12-30 ENCOUNTER — Inpatient Hospital Stay: Payer: PPO

## 2020-12-30 ENCOUNTER — Ambulatory Visit: Payer: PPO

## 2020-12-31 ENCOUNTER — Inpatient Hospital Stay: Payer: PPO | Attending: Radiation Oncology

## 2020-12-31 ENCOUNTER — Ambulatory Visit
Admission: RE | Admit: 2020-12-31 | Discharge: 2020-12-31 | Disposition: A | Discharge status: Home / Self Care | Payer: PPO | Source: Ambulatory Visit | Attending: Radiation Oncology | Admitting: Radiation Oncology

## 2020-12-31 DIAGNOSIS — C329 Malignant neoplasm of larynx, unspecified: Secondary | ICD-10-CM | POA: Insufficient documentation

## 2020-12-31 LAB — CBC
HCT: 45.2 % (ref 39.0–52.0)
Hemoglobin: 14.7 g/dL (ref 13.0–17.0)
MCH: 25.8 pg — ABNORMAL LOW (ref 26.0–34.0)
MCHC: 32.5 g/dL (ref 30.0–36.0)
MCV: 79.3 fL — ABNORMAL LOW (ref 80.0–100.0)
Platelets: 146 10*3/uL — ABNORMAL LOW (ref 150–400)
RBC: 5.7 MIL/uL (ref 4.22–5.81)
RDW: 15.1 % (ref 11.5–15.5)
WBC: 8.5 10*3/uL (ref 4.0–10.5)
nRBC: 0 % (ref 0.0–0.2)

## 2021-01-01 ENCOUNTER — Ambulatory Visit
Admission: RE | Admit: 2021-01-01 | Discharge: 2021-01-01 | Disposition: A | Discharge status: Home / Self Care | Payer: PPO | Source: Ambulatory Visit | Attending: Radiation Oncology | Admitting: Radiation Oncology

## 2021-01-01 DIAGNOSIS — C329 Malignant neoplasm of larynx, unspecified: Secondary | ICD-10-CM | POA: Diagnosis not present

## 2021-01-02 ENCOUNTER — Ambulatory Visit
Admission: RE | Admit: 2021-01-02 | Discharge: 2021-01-02 | Disposition: A | Discharge status: Home / Self Care | Payer: PPO | Source: Ambulatory Visit | Attending: Radiation Oncology | Admitting: Radiation Oncology

## 2021-01-02 DIAGNOSIS — C329 Malignant neoplasm of larynx, unspecified: Secondary | ICD-10-CM | POA: Diagnosis not present

## 2021-01-03 ENCOUNTER — Ambulatory Visit
Admission: RE | Admit: 2021-01-03 | Discharge: 2021-01-03 | Disposition: A | Discharge status: Home / Self Care | Payer: PPO | Source: Ambulatory Visit | Attending: Radiation Oncology | Admitting: Radiation Oncology

## 2021-01-03 DIAGNOSIS — C329 Malignant neoplasm of larynx, unspecified: Secondary | ICD-10-CM | POA: Diagnosis not present

## 2021-01-06 ENCOUNTER — Other Ambulatory Visit: Payer: Self-pay

## 2021-01-06 ENCOUNTER — Ambulatory Visit
Admission: RE | Admit: 2021-01-06 | Discharge: 2021-01-06 | Disposition: A | Discharge status: Home / Self Care | Payer: PPO | Source: Ambulatory Visit | Attending: Radiation Oncology | Admitting: Radiation Oncology

## 2021-01-06 ENCOUNTER — Inpatient Hospital Stay: Payer: PPO

## 2021-01-06 DIAGNOSIS — C329 Malignant neoplasm of larynx, unspecified: Secondary | ICD-10-CM

## 2021-01-06 LAB — CBC
HCT: 47.7 % (ref 39.0–52.0)
Hemoglobin: 15.5 g/dL (ref 13.0–17.0)
MCH: 25.7 pg — ABNORMAL LOW (ref 26.0–34.0)
MCHC: 32.5 g/dL (ref 30.0–36.0)
MCV: 79.2 fL — ABNORMAL LOW (ref 80.0–100.0)
Platelets: 160 10*3/uL (ref 150–400)
RBC: 6.02 MIL/uL — ABNORMAL HIGH (ref 4.22–5.81)
RDW: 15.2 % (ref 11.5–15.5)
WBC: 8.9 10*3/uL (ref 4.0–10.5)
nRBC: 0 % (ref 0.0–0.2)

## 2021-01-07 ENCOUNTER — Ambulatory Visit
Admission: RE | Admit: 2021-01-07 | Discharge: 2021-01-07 | Disposition: A | Discharge status: Home / Self Care | Payer: PPO | Source: Ambulatory Visit | Attending: Radiation Oncology | Admitting: Radiation Oncology

## 2021-01-07 DIAGNOSIS — C329 Malignant neoplasm of larynx, unspecified: Secondary | ICD-10-CM | POA: Diagnosis not present

## 2021-01-08 ENCOUNTER — Ambulatory Visit
Admission: RE | Admit: 2021-01-08 | Discharge: 2021-01-08 | Disposition: A | Discharge status: Home / Self Care | Payer: PPO | Source: Ambulatory Visit | Attending: Radiation Oncology | Admitting: Radiation Oncology

## 2021-01-08 DIAGNOSIS — C329 Malignant neoplasm of larynx, unspecified: Secondary | ICD-10-CM | POA: Diagnosis not present

## 2021-01-09 ENCOUNTER — Ambulatory Visit
Admission: RE | Admit: 2021-01-09 | Discharge: 2021-01-09 | Disposition: A | Discharge status: Home / Self Care | Payer: PPO | Source: Ambulatory Visit | Attending: Radiation Oncology | Admitting: Radiation Oncology

## 2021-01-09 DIAGNOSIS — C329 Malignant neoplasm of larynx, unspecified: Secondary | ICD-10-CM | POA: Diagnosis not present

## 2021-01-10 ENCOUNTER — Ambulatory Visit
Admission: RE | Admit: 2021-01-10 | Discharge: 2021-01-10 | Disposition: A | Discharge status: Home / Self Care | Payer: PPO | Source: Ambulatory Visit | Attending: Radiation Oncology | Admitting: Radiation Oncology

## 2021-01-10 DIAGNOSIS — C329 Malignant neoplasm of larynx, unspecified: Secondary | ICD-10-CM | POA: Diagnosis not present

## 2021-01-13 ENCOUNTER — Ambulatory Visit: Payer: PPO

## 2021-01-13 ENCOUNTER — Inpatient Hospital Stay: Payer: PPO

## 2021-01-14 ENCOUNTER — Inpatient Hospital Stay: Payer: PPO

## 2021-01-14 ENCOUNTER — Ambulatory Visit
Admission: RE | Admit: 2021-01-14 | Discharge: 2021-01-14 | Disposition: A | Discharge status: Home / Self Care | Payer: PPO | Source: Ambulatory Visit | Attending: Radiation Oncology | Admitting: Radiation Oncology

## 2021-01-14 ENCOUNTER — Other Ambulatory Visit: Payer: Self-pay

## 2021-01-14 DIAGNOSIS — C329 Malignant neoplasm of larynx, unspecified: Secondary | ICD-10-CM

## 2021-01-14 LAB — CBC
HCT: 47 % (ref 39.0–52.0)
Hemoglobin: 15.3 g/dL (ref 13.0–17.0)
MCH: 26.1 pg (ref 26.0–34.0)
MCHC: 32.6 g/dL (ref 30.0–36.0)
MCV: 80.2 fL (ref 80.0–100.0)
Platelets: 163 10*3/uL (ref 150–400)
RBC: 5.86 MIL/uL — ABNORMAL HIGH (ref 4.22–5.81)
RDW: 15.1 % (ref 11.5–15.5)
WBC: 9.1 10*3/uL (ref 4.0–10.5)
nRBC: 0 % (ref 0.0–0.2)

## 2021-01-15 ENCOUNTER — Ambulatory Visit
Admission: RE | Admit: 2021-01-15 | Discharge: 2021-01-15 | Disposition: A | Discharge status: Home / Self Care | Payer: PPO | Source: Ambulatory Visit | Attending: Radiation Oncology | Admitting: Radiation Oncology

## 2021-01-15 DIAGNOSIS — C329 Malignant neoplasm of larynx, unspecified: Secondary | ICD-10-CM | POA: Diagnosis not present

## 2021-01-16 ENCOUNTER — Ambulatory Visit
Admission: RE | Admit: 2021-01-16 | Discharge: 2021-01-16 | Disposition: A | Discharge status: Home / Self Care | Payer: PPO | Source: Ambulatory Visit | Attending: Radiation Oncology | Admitting: Radiation Oncology

## 2021-01-16 DIAGNOSIS — C329 Malignant neoplasm of larynx, unspecified: Secondary | ICD-10-CM | POA: Diagnosis not present

## 2021-01-17 ENCOUNTER — Ambulatory Visit
Admission: RE | Admit: 2021-01-17 | Discharge: 2021-01-17 | Disposition: A | Discharge status: Home / Self Care | Payer: PPO | Source: Ambulatory Visit | Attending: Radiation Oncology | Admitting: Radiation Oncology

## 2021-01-17 DIAGNOSIS — C329 Malignant neoplasm of larynx, unspecified: Secondary | ICD-10-CM | POA: Diagnosis not present

## 2021-01-20 ENCOUNTER — Ambulatory Visit: Payer: PPO

## 2021-01-20 ENCOUNTER — Ambulatory Visit
Admission: RE | Admit: 2021-01-20 | Discharge: 2021-01-20 | Disposition: A | Discharge status: Home / Self Care | Payer: PPO | Source: Ambulatory Visit | Attending: Radiation Oncology | Admitting: Radiation Oncology

## 2021-01-20 DIAGNOSIS — C329 Malignant neoplasm of larynx, unspecified: Secondary | ICD-10-CM | POA: Diagnosis not present

## 2021-01-21 ENCOUNTER — Ambulatory Visit
Admission: RE | Admit: 2021-01-21 | Discharge: 2021-01-21 | Disposition: A | Discharge status: Home / Self Care | Payer: PPO | Source: Ambulatory Visit | Attending: Radiation Oncology | Admitting: Radiation Oncology

## 2021-01-21 ENCOUNTER — Ambulatory Visit: Payer: PPO

## 2021-01-21 DIAGNOSIS — C329 Malignant neoplasm of larynx, unspecified: Secondary | ICD-10-CM | POA: Diagnosis not present

## 2021-01-22 ENCOUNTER — Ambulatory Visit
Admission: RE | Admit: 2021-01-22 | Discharge: 2021-01-22 | Disposition: A | Discharge status: Home / Self Care | Payer: PPO | Source: Ambulatory Visit | Attending: Radiation Oncology | Admitting: Radiation Oncology

## 2021-01-22 DIAGNOSIS — C329 Malignant neoplasm of larynx, unspecified: Secondary | ICD-10-CM | POA: Diagnosis not present

## 2021-02-07 DIAGNOSIS — E559 Vitamin D deficiency, unspecified: Secondary | ICD-10-CM | POA: Diagnosis not present

## 2021-02-07 DIAGNOSIS — I10 Essential (primary) hypertension: Secondary | ICD-10-CM | POA: Diagnosis not present

## 2021-02-07 DIAGNOSIS — E782 Mixed hyperlipidemia: Secondary | ICD-10-CM | POA: Diagnosis not present

## 2021-02-07 DIAGNOSIS — E119 Type 2 diabetes mellitus without complications: Secondary | ICD-10-CM | POA: Diagnosis not present

## 2021-02-09 ENCOUNTER — Other Ambulatory Visit: Payer: Self-pay | Admitting: Radiation Oncology

## 2021-02-11 DIAGNOSIS — E559 Vitamin D deficiency, unspecified: Secondary | ICD-10-CM | POA: Diagnosis not present

## 2021-02-11 DIAGNOSIS — I10 Essential (primary) hypertension: Secondary | ICD-10-CM | POA: Diagnosis not present

## 2021-02-11 DIAGNOSIS — C329 Malignant neoplasm of larynx, unspecified: Secondary | ICD-10-CM | POA: Diagnosis not present

## 2021-02-11 DIAGNOSIS — E119 Type 2 diabetes mellitus without complications: Secondary | ICD-10-CM | POA: Insufficient documentation

## 2021-02-11 DIAGNOSIS — G250 Essential tremor: Secondary | ICD-10-CM | POA: Insufficient documentation

## 2021-02-11 DIAGNOSIS — N400 Enlarged prostate without lower urinary tract symptoms: Secondary | ICD-10-CM | POA: Diagnosis not present

## 2021-02-11 DIAGNOSIS — E782 Mixed hyperlipidemia: Secondary | ICD-10-CM | POA: Diagnosis not present

## 2021-02-11 DIAGNOSIS — E1165 Type 2 diabetes mellitus with hyperglycemia: Secondary | ICD-10-CM | POA: Insufficient documentation

## 2021-02-27 DIAGNOSIS — I10 Essential (primary) hypertension: Secondary | ICD-10-CM | POA: Diagnosis not present

## 2021-02-27 DIAGNOSIS — E782 Mixed hyperlipidemia: Secondary | ICD-10-CM | POA: Diagnosis not present

## 2021-02-27 DIAGNOSIS — E119 Type 2 diabetes mellitus without complications: Secondary | ICD-10-CM | POA: Diagnosis not present

## 2021-02-27 DIAGNOSIS — D72828 Other elevated white blood cell count: Secondary | ICD-10-CM | POA: Diagnosis not present

## 2021-03-05 ENCOUNTER — Encounter: Payer: Self-pay | Admitting: Radiation Oncology

## 2021-03-05 ENCOUNTER — Ambulatory Visit
Admission: RE | Admit: 2021-03-05 | Discharge: 2021-03-05 | Disposition: A | Discharge status: Home / Self Care | Payer: PPO | Source: Ambulatory Visit | Attending: Radiation Oncology | Admitting: Radiation Oncology

## 2021-03-05 VITALS — BP 177/91 | HR 95 | Temp 96.4°F | Wt 243.0 lb

## 2021-03-05 DIAGNOSIS — Z923 Personal history of irradiation: Secondary | ICD-10-CM | POA: Diagnosis not present

## 2021-03-05 DIAGNOSIS — C329 Malignant neoplasm of larynx, unspecified: Secondary | ICD-10-CM | POA: Insufficient documentation

## 2021-03-05 NOTE — Progress Notes (Signed)
Radiation Oncology Follow up Note  Name: [name]   Date:   03/05/2021 MRN:  [id number] DOB: 1946/11/22    This 75 y.o. male presents to the clinic today for 1 month follow-up status post squamous cell carcinoma of the larynx stage I.  REFERRING PROVIDER: Dion Body, MD  HPI: Patient is a 75 year old male now at 1 month having completed external beam radiation therapy to his larynx for stage I squamous cell carcinoma.  Seen today in routine follow-up he is doing well 6 still has some hoarseness of his voice which we would expect he is having no dysphagia at this time..  COMPLICATIONS OF TREATMENT: none  FOLLOW UP COMPLIANCE: keeps appointments   PHYSICAL EXAM:  BP (!) 177/91   Pulse 95   Temp (!) 96.4 F (35.8 C) (Tympanic)   Wt 243 lb (110.2 kg)   BMI 32.96 kg/m  Neck is clear without evidence of adenopathy.  Well-developed well-nourished patient in NAD. HEENT reveals PERLA, EOMI, discs not visualized.  Oral cavity is clear. No oral mucosal lesions are identified. Neck is clear without evidence of cervical or supraclavicular adenopathy. Lungs are clear to A&P. Cardiac examination is essentially unremarkable with regular rate and rhythm without murmur rub or thrill. Abdomen is benign with no organomegaly or masses noted. Motor sensory and DTR levels are equal and symmetric in the upper and lower extremities. Cranial nerves II through XII are grossly intact. Proprioception is intact. No peripheral adenopathy or edema is identified. No motor or sensory levels are noted. Crude visual fields are within normal range.  RADIOLOGY RESULTS: No current films to review  PLAN: Present time of asked the patient to establish follow-up care with Dr. Richardson Landry in ENT.  I have asked to see him back in 3 to 4 months for follow-up.  Patient knows to call anytime with any concerns he has an excellent low side effect profile.  I would like to take this opportunity to thank you for allowing me to  participate in the care of your patient.Noreene Filbert, MD

## 2021-03-25 ENCOUNTER — Telehealth: Payer: Self-pay | Admitting: *Deleted

## 2021-03-25 NOTE — Telephone Encounter (Signed)
[employer] called asking if there is any assistance available to help pay for the radiation therapy bills that are being sent to them.

## 2021-03-31 DIAGNOSIS — E119 Type 2 diabetes mellitus without complications: Secondary | ICD-10-CM | POA: Diagnosis not present

## 2021-03-31 DIAGNOSIS — I10 Essential (primary) hypertension: Secondary | ICD-10-CM | POA: Diagnosis not present

## 2021-03-31 DIAGNOSIS — E782 Mixed hyperlipidemia: Secondary | ICD-10-CM | POA: Diagnosis not present

## 2021-03-31 DIAGNOSIS — S20461A Insect bite (nonvenomous) of right back wall of thorax, initial encounter: Secondary | ICD-10-CM | POA: Diagnosis not present

## 2021-05-05 DIAGNOSIS — R1314 Dysphagia, pharyngoesophageal phase: Secondary | ICD-10-CM | POA: Diagnosis not present

## 2021-05-05 DIAGNOSIS — Z8521 Personal history of malignant neoplasm of larynx: Secondary | ICD-10-CM | POA: Diagnosis not present

## 2021-05-12 DIAGNOSIS — R0602 Shortness of breath: Secondary | ICD-10-CM | POA: Diagnosis not present

## 2021-05-12 DIAGNOSIS — E782 Mixed hyperlipidemia: Secondary | ICD-10-CM | POA: Diagnosis not present

## 2021-05-12 DIAGNOSIS — E559 Vitamin D deficiency, unspecified: Secondary | ICD-10-CM | POA: Diagnosis not present

## 2021-05-12 DIAGNOSIS — E119 Type 2 diabetes mellitus without complications: Secondary | ICD-10-CM | POA: Diagnosis not present

## 2021-05-12 DIAGNOSIS — I340 Nonrheumatic mitral (valve) insufficiency: Secondary | ICD-10-CM | POA: Diagnosis not present

## 2021-05-12 DIAGNOSIS — I10 Essential (primary) hypertension: Secondary | ICD-10-CM | POA: Diagnosis not present

## 2021-05-19 DIAGNOSIS — I445 Left posterior fascicular block: Secondary | ICD-10-CM | POA: Diagnosis not present

## 2021-05-19 DIAGNOSIS — I10 Essential (primary) hypertension: Secondary | ICD-10-CM | POA: Diagnosis not present

## 2021-05-19 DIAGNOSIS — I509 Heart failure, unspecified: Secondary | ICD-10-CM | POA: Diagnosis not present

## 2021-05-19 DIAGNOSIS — E782 Mixed hyperlipidemia: Secondary | ICD-10-CM | POA: Diagnosis not present

## 2021-05-19 DIAGNOSIS — I255 Ischemic cardiomyopathy: Secondary | ICD-10-CM | POA: Diagnosis not present

## 2021-05-19 DIAGNOSIS — R0602 Shortness of breath: Secondary | ICD-10-CM | POA: Diagnosis not present

## 2021-05-19 DIAGNOSIS — I340 Nonrheumatic mitral (valve) insufficiency: Secondary | ICD-10-CM | POA: Diagnosis not present

## 2021-05-20 DIAGNOSIS — I255 Ischemic cardiomyopathy: Secondary | ICD-10-CM | POA: Diagnosis not present

## 2021-05-20 DIAGNOSIS — R0602 Shortness of breath: Secondary | ICD-10-CM | POA: Diagnosis not present

## 2021-05-20 DIAGNOSIS — I509 Heart failure, unspecified: Secondary | ICD-10-CM | POA: Diagnosis not present

## 2021-05-20 DIAGNOSIS — I10 Essential (primary) hypertension: Secondary | ICD-10-CM | POA: Diagnosis not present

## 2021-05-20 DIAGNOSIS — E782 Mixed hyperlipidemia: Secondary | ICD-10-CM | POA: Diagnosis not present

## 2021-05-20 DIAGNOSIS — I445 Left posterior fascicular block: Secondary | ICD-10-CM | POA: Diagnosis not present

## 2021-05-20 DIAGNOSIS — I340 Nonrheumatic mitral (valve) insufficiency: Secondary | ICD-10-CM | POA: Diagnosis not present

## 2021-06-03 DIAGNOSIS — I255 Ischemic cardiomyopathy: Secondary | ICD-10-CM | POA: Diagnosis not present

## 2021-06-03 DIAGNOSIS — I10 Essential (primary) hypertension: Secondary | ICD-10-CM | POA: Diagnosis not present

## 2021-06-03 DIAGNOSIS — R0602 Shortness of breath: Secondary | ICD-10-CM | POA: Diagnosis not present

## 2021-06-03 DIAGNOSIS — I445 Left posterior fascicular block: Secondary | ICD-10-CM | POA: Diagnosis not present

## 2021-06-03 DIAGNOSIS — R079 Chest pain, unspecified: Secondary | ICD-10-CM | POA: Diagnosis not present

## 2021-06-03 DIAGNOSIS — E782 Mixed hyperlipidemia: Secondary | ICD-10-CM | POA: Diagnosis not present

## 2021-06-05 DIAGNOSIS — R079 Chest pain, unspecified: Secondary | ICD-10-CM | POA: Diagnosis not present

## 2021-06-05 DIAGNOSIS — I255 Ischemic cardiomyopathy: Secondary | ICD-10-CM | POA: Diagnosis not present

## 2021-06-05 DIAGNOSIS — E782 Mixed hyperlipidemia: Secondary | ICD-10-CM | POA: Diagnosis not present

## 2021-06-05 DIAGNOSIS — I10 Essential (primary) hypertension: Secondary | ICD-10-CM | POA: Diagnosis not present

## 2021-06-05 DIAGNOSIS — R0602 Shortness of breath: Secondary | ICD-10-CM | POA: Diagnosis not present

## 2021-06-05 DIAGNOSIS — I445 Left posterior fascicular block: Secondary | ICD-10-CM | POA: Diagnosis not present

## 2021-06-16 DIAGNOSIS — R072 Precordial pain: Secondary | ICD-10-CM | POA: Diagnosis not present

## 2021-06-19 DIAGNOSIS — R0602 Shortness of breath: Secondary | ICD-10-CM | POA: Diagnosis not present

## 2021-06-19 DIAGNOSIS — E782 Mixed hyperlipidemia: Secondary | ICD-10-CM | POA: Diagnosis not present

## 2021-06-19 DIAGNOSIS — I10 Essential (primary) hypertension: Secondary | ICD-10-CM | POA: Diagnosis not present

## 2021-06-19 DIAGNOSIS — I445 Left posterior fascicular block: Secondary | ICD-10-CM | POA: Diagnosis not present

## 2021-06-19 DIAGNOSIS — I2510 Atherosclerotic heart disease of native coronary artery without angina pectoris: Secondary | ICD-10-CM | POA: Diagnosis not present

## 2021-06-19 DIAGNOSIS — R0789 Other chest pain: Secondary | ICD-10-CM | POA: Diagnosis not present

## 2021-07-07 ENCOUNTER — Ambulatory Visit
Admission: RE | Admit: 2021-07-07 | Discharge: 2021-07-07 | Disposition: A | Discharge status: Home / Self Care | Payer: PPO | Source: Ambulatory Visit | Attending: Radiation Oncology | Admitting: Radiation Oncology

## 2021-07-07 VITALS — BP 160/85 | HR 88 | Temp 97.0°F | Wt 232.5 lb

## 2021-07-07 DIAGNOSIS — Z08 Encounter for follow-up examination after completed treatment for malignant neoplasm: Secondary | ICD-10-CM | POA: Diagnosis not present

## 2021-07-07 DIAGNOSIS — C329 Malignant neoplasm of larynx, unspecified: Secondary | ICD-10-CM | POA: Diagnosis not present

## 2021-07-07 NOTE — Progress Notes (Signed)
Radiation Oncology Follow up Note  Name: [name]   Date:   07/07/2021 MRN:  [id number] DOB: August 09, 1946    This 75 y.o. male presents to the clinic today for 21-month follow-up status post external beam radiation therapy for squamous cell carcinoma the larynx stage I.  REFERRING PROVIDER: Dion Body, MD  HPI: Patient is a 75 year old male now out 5 months having completed external beam radiation therapy to his larynx for stage I squamous cell carcinoma seen today in routine follow-up he has a persistent cough sinus drainage.  He is having no significant dysphagia or head and neck pain.  Occasionally he does have some pain in his larynx.  He has been under close observation by ENT showing no evidence of disease.  He is also under care by his GP for sinusitis..  COMPLICATIONS OF TREATMENT: none  FOLLOW UP COMPLIANCE: keeps appointments   PHYSICAL EXAM:  BP (!) 160/85   Pulse 88   Temp (!) 97 F (36.1 C)   Wt 232 lb 8 oz (105.5 kg)   BMI 31.53 kg/m  Neck is clear without evidence of cervical or supraclavicular adenopathy.  Well-developed well-nourished patient in NAD. HEENT reveals PERLA, EOMI, discs not visualized.  Oral cavity is clear. No oral mucosal lesions are identified. Neck is clear without evidence of cervical or supraclavicular adenopathy. Lungs are clear to A&P. Cardiac examination is essentially unremarkable with regular rate and rhythm without murmur rub or thrill. Abdomen is benign with no organomegaly or masses noted. Motor sensory and DTR levels are equal and symmetric in the upper and lower extremities. Cranial nerves II through XII are grossly intact. Proprioception is intact. No peripheral adenopathy or edema is identified. No motor or sensory levels are noted. Crude visual fields are within normal range.  RADIOLOGY RESULTS: No current films to review  PLAN: Present time patient is doing fairly well.  I believe some of his cough is due to postnasal drainage  I have asked his GP to start him on antibiotic therapy for sinusitis.  Unusual to have larynx causing significant cough since this is a tightly confined field with no pulmonary tissue in our treatment field.  I have otherwise asked to see him back in 6 months for follow-up.  Patient knows to call with any concerns.  I would like to take this opportunity to thank you for allowing me to participate in the care of your patient.Noreene Filbert, MD

## 2021-07-11 DIAGNOSIS — J3089 Other allergic rhinitis: Secondary | ICD-10-CM | POA: Diagnosis not present

## 2021-07-11 DIAGNOSIS — E119 Type 2 diabetes mellitus without complications: Secondary | ICD-10-CM | POA: Diagnosis not present

## 2021-07-11 DIAGNOSIS — I2510 Atherosclerotic heart disease of native coronary artery without angina pectoris: Secondary | ICD-10-CM | POA: Diagnosis not present

## 2021-07-11 DIAGNOSIS — R0602 Shortness of breath: Secondary | ICD-10-CM | POA: Diagnosis not present

## 2021-07-11 DIAGNOSIS — E782 Mixed hyperlipidemia: Secondary | ICD-10-CM | POA: Diagnosis not present

## 2021-07-11 DIAGNOSIS — E559 Vitamin D deficiency, unspecified: Secondary | ICD-10-CM | POA: Diagnosis not present

## 2021-07-11 DIAGNOSIS — I10 Essential (primary) hypertension: Secondary | ICD-10-CM | POA: Diagnosis not present

## 2021-07-15 DIAGNOSIS — G441 Vascular headache, not elsewhere classified: Secondary | ICD-10-CM | POA: Diagnosis not present

## 2021-07-15 DIAGNOSIS — E782 Mixed hyperlipidemia: Secondary | ICD-10-CM | POA: Diagnosis not present

## 2021-07-15 DIAGNOSIS — I10 Essential (primary) hypertension: Secondary | ICD-10-CM | POA: Diagnosis not present

## 2021-07-15 DIAGNOSIS — M542 Cervicalgia: Secondary | ICD-10-CM | POA: Diagnosis not present

## 2021-07-15 DIAGNOSIS — E119 Type 2 diabetes mellitus without complications: Secondary | ICD-10-CM | POA: Diagnosis not present

## 2021-07-18 DIAGNOSIS — G441 Vascular headache, not elsewhere classified: Secondary | ICD-10-CM | POA: Diagnosis not present

## 2021-07-22 DIAGNOSIS — E782 Mixed hyperlipidemia: Secondary | ICD-10-CM | POA: Diagnosis not present

## 2021-07-22 DIAGNOSIS — E119 Type 2 diabetes mellitus without complications: Secondary | ICD-10-CM | POA: Diagnosis not present

## 2021-07-22 DIAGNOSIS — I10 Essential (primary) hypertension: Secondary | ICD-10-CM | POA: Diagnosis not present

## 2021-07-22 DIAGNOSIS — R42 Dizziness and giddiness: Secondary | ICD-10-CM | POA: Diagnosis not present

## 2021-07-23 DIAGNOSIS — R42 Dizziness and giddiness: Secondary | ICD-10-CM | POA: Diagnosis not present

## 2021-07-23 DIAGNOSIS — I6521 Occlusion and stenosis of right carotid artery: Secondary | ICD-10-CM | POA: Diagnosis not present

## 2021-08-04 DIAGNOSIS — E119 Type 2 diabetes mellitus without complications: Secondary | ICD-10-CM | POA: Diagnosis not present

## 2021-08-04 DIAGNOSIS — I6521 Occlusion and stenosis of right carotid artery: Secondary | ICD-10-CM | POA: Diagnosis not present

## 2021-08-05 DIAGNOSIS — E782 Mixed hyperlipidemia: Secondary | ICD-10-CM | POA: Diagnosis not present

## 2021-08-05 DIAGNOSIS — I6523 Occlusion and stenosis of bilateral carotid arteries: Secondary | ICD-10-CM | POA: Diagnosis not present

## 2021-08-05 DIAGNOSIS — E119 Type 2 diabetes mellitus without complications: Secondary | ICD-10-CM | POA: Diagnosis not present

## 2021-08-05 DIAGNOSIS — I2510 Atherosclerotic heart disease of native coronary artery without angina pectoris: Secondary | ICD-10-CM | POA: Diagnosis not present

## 2021-08-05 DIAGNOSIS — I10 Essential (primary) hypertension: Secondary | ICD-10-CM | POA: Diagnosis not present

## 2021-08-10 DIAGNOSIS — I6529 Occlusion and stenosis of unspecified carotid artery: Secondary | ICD-10-CM | POA: Insufficient documentation

## 2021-08-10 NOTE — Progress Notes (Signed)
MRN : [id number]  [name] is a 75 y.o. (Nov 17, 1946) male who presents with chief complaint of carotid blockage.  History of Present Illness:   The patient is seen for evaluation of carotid stenosis. The carotid stenosis was identified after a duplex ultrasound was obtained for a bruit.  The patient has had extensive neck surgery for cancer and more recently because of a recurrence underwent maximal radiation therapy to the neck  The patient describes episodes of amaurosis fugax.  He also describes frequent episodes of syncope especially if he tries to gaze upward or skyward.  There is no recent history of TIA symptoms or focal motor deficits. There is no prior documented CVA.  There is no history of migraine headaches. There is no history of seizures.  The patient is taking enteric-coated aspirin 325 mg daily.  The patient has a history of coronary artery disease, no recent episodes of angina or shortness of breath. The patient denies PAD or claudication symptoms. There is a history of hyperlipidemia which is being treated with a statin.    No outpatient medications have been marked as taking for the 08/11/21 encounter (Appointment) with Delana Meyer, Dolores Lory, MD.    Past Medical History:  Diagnosis Date   Back pain    Bladder cancer ([place])    COVID-19 09/10/2020   Received Casirivimab/Irdevimab infusion on 09/11/20   Diabetes mellitus without complication (HCC)    HOH (hard of hearing)    Hypertension    Obesity    Renal cancer ([place])    Renal cell cancer ([place])    Throat cancer ([place])    Tremor     Past Surgical History:  Procedure Laterality Date   APPENDECTOMY     BLADDER SURGERY     CERVICAL SPINE SURGERY     CHOLECYSTECTOMY N/A 03/23/2018   Procedure: LAPAROSCOPIC CHOLECYSTECTOMY;  Surgeon: Herbert Pun, MD;  Location: [name];  Service: General;  Laterality: N/A;   COLONOSCOPY     COLONOSCOPY WITH PROPOFOL N/A 11/10/2017   Procedure: COLONOSCOPY WITH  PROPOFOL;  Surgeon: Toledo, Benay Pike, MD;  Location: [name];  Service: Gastroenterology;  Laterality: N/A;   ESOPHAGOGASTRODUODENOSCOPY (EGD) WITH PROPOFOL N/A 11/10/2017   Procedure: ESOPHAGOGASTRODUODENOSCOPY (EGD) WITH PROPOFOL;  Surgeon: Toledo, Benay Pike, MD;  Location: [name];  Service: Gastroenterology;  Laterality: N/A;   HERNIA REPAIR     left od surgery     MICROLARYNGOSCOPY N/A 11/05/2020   Procedure: MICROLARYNGOSCOPY WITH BIOPSIES;  Surgeon: Clyde Canterbury, MD;  Location: [place];  Service: ENT;  Laterality: N/A;   NEPHRECTOMY     right kidney nephrectomy   rt elbow surgery     THROAT SURGERY  1990   for cancer removal at [place] History Social History   Tobacco Use   Smoking status: Former    Types: Cigarettes    Quit date: 1991    Years since quitting: 31.6   Smokeless tobacco: Never  Vaping Use   Vaping Use: Never used  Substance Use Topics   Alcohol use: Yes    Comment: Occasional beer drinker   Drug use: No    Family History Family History  Problem Relation Age of Onset   Diabetes Brother    Hypertension Brother    CAD Brother    CAD Brother     Allergies  Allergen Reactions   Metformin Diarrhea     REVIEW OF SYSTEMS (Negative unless checked)  Constitutional: '[]'$ Weight loss  '[]'$   Fever  '[]'$ Chills Cardiac: '[]'$ Chest pain   '[]'$ Chest pressure   '[]'$ Palpitations   '[]'$ Shortness of breath when laying flat   '[]'$ Shortness of breath with exertion. Vascular:  '[]'$ Pain in legs with walking   '[]'$ Pain in legs at rest  '[]'$ History of DVT   '[]'$ Phlebitis   '[]'$ Swelling in legs   '[]'$ Varicose veins   '[]'$ Non-healing ulcers Pulmonary:   '[]'$ Uses home oxygen   '[]'$ Productive cough   '[]'$ Hemoptysis   '[]'$ Wheeze  '[]'$ COPD   '[]'$ Asthma Neurologic:  '[]'$ Dizziness   '[]'$ Seizures   '[]'$ History of stroke   '[x]'$ History of TIA  '[]'$ Aphasia   '[x]'$ Vissual changes   '[]'$ Weakness or numbness in arm   '[]'$ Weakness or numbness in leg Musculoskeletal:   '[]'$ Joint swelling   '[]'$ Joint pain    '[]'$ Low back pain Hematologic:  '[]'$ Easy bruising  '[]'$ Easy bleeding   '[]'$ Hypercoagulable state   '[]'$ Anemic Gastrointestinal:  '[]'$ Diarrhea   '[]'$ Vomiting  '[]'$ Gastroesophageal reflux/heartburn   '[]'$ Difficulty swallowing. Genitourinary:  '[x]'$ Chronic kidney disease   '[]'$ Difficult urination  '[]'$ Frequent urination   '[]'$ Blood in urine Skin:  '[]'$ Rashes   '[]'$ Ulcers  Psychological:  '[]'$ History of anxiety   '[]'$  History of major depression.  Physical Examination  There were no vitals filed for this visit. There is no height or weight on file to calculate BMI. Gen: WD/WN, NAD Head: [place]/AT, No temporalis wasting.  Ear/Nose/Throat: Hearing grossly intact, nares w/o erythema or drainage Eyes: PER, EOMI, sclera nonicteric.  Neck: Supple, no masses.  No bruit or JVD.  Pulmonary:  Good air movement, no audible wheezing, no use of accessory muscles.  Cardiac: RRR, normal S1, S2, no Murmurs. Vascular:   Bilateral carotid bruits auscultated.  Incisional scars both right and left side of the neck which appear well-healed Vessel Right Left  Radial Palpable Palpable  Carotid Palpable Palpable  Gastrointestinal: soft, non-distended. No guarding/no peritoneal signs.  Musculoskeletal: M/S 5/5 throughout.  No visible deformity.  Neurologic: CN 2-12 intact. Pain and light touch intact in extremities.  Symmetrical.  Speech is fluent. Motor exam as listed above. Psychiatric: Judgment intact, Mood & affect appropriate for pt's clinical situation. Dermatologic: No rashes or ulcers noted.  No changes consistent with cellulitis.   CBC Lab Results  Component Value Date   WBC 9.1 01/14/2021   HGB 15.3 01/14/2021   HCT 47.0 01/14/2021   MCV 80.2 01/14/2021   PLT 163 01/14/2021    BMET    Component Value Date/Time   NA 133 (L) 03/25/2018 1218   K 4.3 03/25/2018 1218   CL 103 03/25/2018 1218   CO2 21 (L) 03/25/2018 1218   GLUCOSE 303 (H) 03/25/2018 1218   BUN 15 03/25/2018 1218   CREATININE 1.00 10/09/2020 1405   CALCIUM 8.6 (L)  03/25/2018 1218   GFRNONAA 52 (L) 03/25/2018 1218   GFRAA >60 03/25/2018 1218   CrCl cannot be calculated (Patient's most recent lab result is older than the maximum 21 days allowed.).  COAG Lab Results  Component Value Date   INR 1.18 03/23/2018    Radiology No results found.   Assessment/Plan 1. Bilateral carotid artery stenosis Recommend:  The patient is symptomatic with respect to the carotid stenosis.  The patient now has progressed and has a lesion the is >80% likely bilaterally.  I have personally reviewed a CT scan of the neck that was performed with contrast from October 2021.  This supports the greater than 80% right carotid lesion.  There also appears to be a greater than 80% left carotid lesion on this study as  well although it is not as well defined given that this was not a CT angiogram  The risks, benefits and alternative therapies were reviewed in detail with the patient.  All questions were answered.  The patient agrees to proceed with stenting of the right carotid artery.  I have discussed with the patient and his family he will change his 325 aspirin back to 81 mg and we will add Plavix 75 mg on a daily basis.  Continue management of diabetes, HTN and Hyperlipidemia. Healthy heart diet, encouraged exercise at least 4 times per week.    2. Benign essential hypertension Continue antihypertensive medications as already ordered, these medications have been reviewed and there are no changes at this time.   3. Insulin dependent type 2 diabetes mellitus ([place]) Continue hypoglycemic medications as already ordered, these medications have been reviewed and there are no changes at this time.  Hgb A1C to be monitored as already arranged by primary service   4. Pure hypercholesterolemia Continue statin as ordered and reviewed, no changes at this time      Hortencia Pilar, MD  08/10/2021 3:06 PM

## 2021-08-10 NOTE — H&P (View-Only) (Signed)
MRN : [id number]  [name] is a 75 y.o. (01/04/1946) male who presents with chief complaint of carotid blockage.  History of Present Illness:   The patient is seen for evaluation of carotid stenosis. The carotid stenosis was identified after a duplex ultrasound was obtained for a bruit.  The patient has had extensive neck surgery for cancer and more recently because of a recurrence underwent maximal radiation therapy to the neck  The patient describes episodes of amaurosis fugax.  He also describes frequent episodes of syncope especially if he tries to gaze upward or skyward.  There is no recent history of TIA symptoms or focal motor deficits. There is no prior documented CVA.  There is no history of migraine headaches. There is no history of seizures.  The patient is taking enteric-coated aspirin 325 mg daily.  The patient has a history of coronary artery disease, no recent episodes of angina or shortness of breath. The patient denies PAD or claudication symptoms. There is a history of hyperlipidemia which is being treated with a statin.    No outpatient medications have been marked as taking for the 08/11/21 encounter (Appointment) with Delana Meyer, Dolores Lory, MD.    Past Medical History:  Diagnosis Date   Back pain    Bladder cancer ([place])    COVID-19 09/10/2020   Received Casirivimab/Irdevimab infusion on 09/11/20   Diabetes mellitus without complication (HCC)    HOH (hard of hearing)    Hypertension    Obesity    Renal cancer ([place])    Renal cell cancer ([place])    Throat cancer ([place])    Tremor     Past Surgical History:  Procedure Laterality Date   APPENDECTOMY     BLADDER SURGERY     CERVICAL SPINE SURGERY     CHOLECYSTECTOMY N/A 03/23/2018   Procedure: LAPAROSCOPIC CHOLECYSTECTOMY;  Surgeon: Herbert Pun, MD;  Location: [name];  Service: General;  Laterality: N/A;   COLONOSCOPY     COLONOSCOPY WITH PROPOFOL N/A 11/10/2017   Procedure: COLONOSCOPY WITH  PROPOFOL;  Surgeon: Toledo, Benay Pike, MD;  Location: [name];  Service: Gastroenterology;  Laterality: N/A;   ESOPHAGOGASTRODUODENOSCOPY (EGD) WITH PROPOFOL N/A 11/10/2017   Procedure: ESOPHAGOGASTRODUODENOSCOPY (EGD) WITH PROPOFOL;  Surgeon: Toledo, Benay Pike, MD;  Location: [name];  Service: Gastroenterology;  Laterality: N/A;   HERNIA REPAIR     left od surgery     MICROLARYNGOSCOPY N/A 11/05/2020   Procedure: MICROLARYNGOSCOPY WITH BIOPSIES;  Surgeon: Clyde Canterbury, MD;  Location: [place];  Service: ENT;  Laterality: N/A;   NEPHRECTOMY     right kidney nephrectomy   rt elbow surgery     THROAT SURGERY  1990   for cancer removal at [place] History Social History   Tobacco Use   Smoking status: Former    Types: Cigarettes    Quit date: 1991    Years since quitting: 31.6   Smokeless tobacco: Never  Vaping Use   Vaping Use: Never used  Substance Use Topics   Alcohol use: Yes    Comment: Occasional beer drinker   Drug use: No    Family History Family History  Problem Relation Age of Onset   Diabetes Brother    Hypertension Brother    CAD Brother    CAD Brother     Allergies  Allergen Reactions   Metformin Diarrhea     REVIEW OF SYSTEMS (Negative unless checked)  Constitutional: '[]'$ Weight loss  '[]'$   Fever  '[]'$ Chills Cardiac: '[]'$ Chest pain   '[]'$ Chest pressure   '[]'$ Palpitations   '[]'$ Shortness of breath when laying flat   '[]'$ Shortness of breath with exertion. Vascular:  '[]'$ Pain in legs with walking   '[]'$ Pain in legs at rest  '[]'$ History of DVT   '[]'$ Phlebitis   '[]'$ Swelling in legs   '[]'$ Varicose veins   '[]'$ Non-healing ulcers Pulmonary:   '[]'$ Uses home oxygen   '[]'$ Productive cough   '[]'$ Hemoptysis   '[]'$ Wheeze  '[]'$ COPD   '[]'$ Asthma Neurologic:  '[]'$ Dizziness   '[]'$ Seizures   '[]'$ History of stroke   '[x]'$ History of TIA  '[]'$ Aphasia   '[x]'$ Vissual changes   '[]'$ Weakness or numbness in arm   '[]'$ Weakness or numbness in leg Musculoskeletal:   '[]'$ Joint swelling   '[]'$ Joint pain    '[]'$ Low back pain Hematologic:  '[]'$ Easy bruising  '[]'$ Easy bleeding   '[]'$ Hypercoagulable state   '[]'$ Anemic Gastrointestinal:  '[]'$ Diarrhea   '[]'$ Vomiting  '[]'$ Gastroesophageal reflux/heartburn   '[]'$ Difficulty swallowing. Genitourinary:  '[x]'$ Chronic kidney disease   '[]'$ Difficult urination  '[]'$ Frequent urination   '[]'$ Blood in urine Skin:  '[]'$ Rashes   '[]'$ Ulcers  Psychological:  '[]'$ History of anxiety   '[]'$  History of major depression.  Physical Examination  There were no vitals filed for this visit. There is no height or weight on file to calculate BMI. Gen: WD/WN, NAD Head: [place]/AT, No temporalis wasting.  Ear/Nose/Throat: Hearing grossly intact, nares w/o erythema or drainage Eyes: PER, EOMI, sclera nonicteric.  Neck: Supple, no masses.  No bruit or JVD.  Pulmonary:  Good air movement, no audible wheezing, no use of accessory muscles.  Cardiac: RRR, normal S1, S2, no Murmurs. Vascular:   Bilateral carotid bruits auscultated.  Incisional scars both right and left side of the neck which appear well-healed Vessel Right Left  Radial Palpable Palpable  Carotid Palpable Palpable  Gastrointestinal: soft, non-distended. No guarding/no peritoneal signs.  Musculoskeletal: M/S 5/5 throughout.  No visible deformity.  Neurologic: CN 2-12 intact. Pain and light touch intact in extremities.  Symmetrical.  Speech is fluent. Motor exam as listed above. Psychiatric: Judgment intact, Mood & affect appropriate for pt's clinical situation. Dermatologic: No rashes or ulcers noted.  No changes consistent with cellulitis.   CBC Lab Results  Component Value Date   WBC 9.1 01/14/2021   HGB 15.3 01/14/2021   HCT 47.0 01/14/2021   MCV 80.2 01/14/2021   PLT 163 01/14/2021    BMET    Component Value Date/Time   NA 133 (L) 03/25/2018 1218   K 4.3 03/25/2018 1218   CL 103 03/25/2018 1218   CO2 21 (L) 03/25/2018 1218   GLUCOSE 303 (H) 03/25/2018 1218   BUN 15 03/25/2018 1218   CREATININE 1.00 10/09/2020 1405   CALCIUM 8.6 (L)  03/25/2018 1218   GFRNONAA 52 (L) 03/25/2018 1218   GFRAA >60 03/25/2018 1218   CrCl cannot be calculated (Patient's most recent lab result is older than the maximum 21 days allowed.).  COAG Lab Results  Component Value Date   INR 1.18 03/23/2018    Radiology No results found.   Assessment/Plan 1. Bilateral carotid artery stenosis Recommend:  The patient is symptomatic with respect to the carotid stenosis.  The patient now has progressed and has a lesion the is >80% likely bilaterally.  I have personally reviewed a CT scan of the neck that was performed with contrast from October 2021.  This supports the greater than 80% right carotid lesion.  There also appears to be a greater than 80% left carotid lesion on this study as  well although it is not as well defined given that this was not a CT angiogram  The risks, benefits and alternative therapies were reviewed in detail with the patient.  All questions were answered.  The patient agrees to proceed with stenting of the right carotid artery.  I have discussed with the patient and his family he will change his 325 aspirin back to 81 mg and we will add Plavix 75 mg on a daily basis.  Continue management of diabetes, HTN and Hyperlipidemia. Healthy heart diet, encouraged exercise at least 4 times per week.    2. Benign essential hypertension Continue antihypertensive medications as already ordered, these medications have been reviewed and there are no changes at this time.   3. Insulin dependent type 2 diabetes mellitus ([place]) Continue hypoglycemic medications as already ordered, these medications have been reviewed and there are no changes at this time.  Hgb A1C to be monitored as already arranged by primary service   4. Pure hypercholesterolemia Continue statin as ordered and reviewed, no changes at this time      Hortencia Pilar, MD  08/10/2021 3:06 PM

## 2021-08-11 ENCOUNTER — Ambulatory Visit (INDEPENDENT_AMBULATORY_CARE_PROVIDER_SITE_OTHER): Payer: PPO | Admitting: Vascular Surgery

## 2021-08-11 ENCOUNTER — Other Ambulatory Visit: Payer: Self-pay

## 2021-08-11 ENCOUNTER — Encounter (INDEPENDENT_AMBULATORY_CARE_PROVIDER_SITE_OTHER): Payer: Self-pay | Admitting: Vascular Surgery

## 2021-08-11 VITALS — BP 182/76 | HR 87 | Resp 16 | Ht 72.0 in | Wt 232.6 lb

## 2021-08-11 DIAGNOSIS — E119 Type 2 diabetes mellitus without complications: Secondary | ICD-10-CM | POA: Diagnosis not present

## 2021-08-11 DIAGNOSIS — I6523 Occlusion and stenosis of bilateral carotid arteries: Secondary | ICD-10-CM

## 2021-08-11 DIAGNOSIS — I10 Essential (primary) hypertension: Secondary | ICD-10-CM

## 2021-08-11 DIAGNOSIS — E7800 Pure hypercholesterolemia, unspecified: Secondary | ICD-10-CM | POA: Diagnosis not present

## 2021-08-11 DIAGNOSIS — Z794 Long term (current) use of insulin: Secondary | ICD-10-CM | POA: Diagnosis not present

## 2021-08-11 MED ORDER — CLOPIDOGREL BISULFATE 75 MG PO TABS: 75.0000 mg | ORAL_TABLET | Freq: Every day | ORAL | 5 refills | Status: DC

## 2021-08-13 ENCOUNTER — Encounter (INDEPENDENT_AMBULATORY_CARE_PROVIDER_SITE_OTHER): Payer: Self-pay

## 2021-08-15 ENCOUNTER — Other Ambulatory Visit: Admission: RE | Admit: 2021-08-15 | Payer: PPO | Source: Ambulatory Visit

## 2021-08-18 ENCOUNTER — Encounter (INDEPENDENT_AMBULATORY_CARE_PROVIDER_SITE_OTHER): Payer: PPO | Admitting: Vascular Surgery

## 2021-08-19 ENCOUNTER — Other Ambulatory Visit (INDEPENDENT_AMBULATORY_CARE_PROVIDER_SITE_OTHER): Payer: Self-pay | Admitting: Nurse Practitioner

## 2021-08-19 DIAGNOSIS — I6521 Occlusion and stenosis of right carotid artery: Secondary | ICD-10-CM

## 2021-08-26 ENCOUNTER — Other Ambulatory Visit: Payer: Self-pay

## 2021-08-26 ENCOUNTER — Encounter: Payer: Self-pay | Admitting: Vascular Surgery

## 2021-08-26 ENCOUNTER — Inpatient Hospital Stay
Admission: RE | Admit: 2021-08-26 | Discharge: 2021-08-27 | DRG: 036 | Disposition: A | Discharge status: Home / Self Care | Payer: PPO | Attending: Vascular Surgery | Admitting: Vascular Surgery

## 2021-08-26 ENCOUNTER — Encounter
Admission: RE | Disposition: A | Discharge status: Home / Self Care | Payer: Self-pay | Source: Home / Self Care | Attending: Vascular Surgery

## 2021-08-26 DIAGNOSIS — Z85528 Personal history of other malignant neoplasm of kidney: Secondary | ICD-10-CM | POA: Diagnosis not present

## 2021-08-26 DIAGNOSIS — I6521 Occlusion and stenosis of right carotid artery: Secondary | ICD-10-CM | POA: Diagnosis not present

## 2021-08-26 DIAGNOSIS — I6523 Occlusion and stenosis of bilateral carotid arteries: Secondary | ICD-10-CM | POA: Diagnosis not present

## 2021-08-26 DIAGNOSIS — Z8616 Personal history of COVID-19: Secondary | ICD-10-CM

## 2021-08-26 DIAGNOSIS — Z6831 Body mass index (BMI) 31.0-31.9, adult: Secondary | ICD-10-CM

## 2021-08-26 DIAGNOSIS — Z794 Long term (current) use of insulin: Secondary | ICD-10-CM

## 2021-08-26 DIAGNOSIS — Z7984 Long term (current) use of oral hypoglycemic drugs: Secondary | ICD-10-CM

## 2021-08-26 DIAGNOSIS — E785 Hyperlipidemia, unspecified: Secondary | ICD-10-CM | POA: Diagnosis present

## 2021-08-26 DIAGNOSIS — Z7982 Long term (current) use of aspirin: Secondary | ICD-10-CM

## 2021-08-26 DIAGNOSIS — Z8551 Personal history of malignant neoplasm of bladder: Secondary | ICD-10-CM

## 2021-08-26 DIAGNOSIS — I152 Hypertension secondary to endocrine disorders: Secondary | ICD-10-CM | POA: Diagnosis present

## 2021-08-26 DIAGNOSIS — E1169 Type 2 diabetes mellitus with other specified complication: Secondary | ICD-10-CM | POA: Diagnosis present

## 2021-08-26 DIAGNOSIS — E669 Obesity, unspecified: Secondary | ICD-10-CM | POA: Diagnosis present

## 2021-08-26 DIAGNOSIS — Z79899 Other long term (current) drug therapy: Secondary | ICD-10-CM | POA: Diagnosis not present

## 2021-08-26 DIAGNOSIS — Z8249 Family history of ischemic heart disease and other diseases of the circulatory system: Secondary | ICD-10-CM | POA: Diagnosis not present

## 2021-08-26 DIAGNOSIS — Z006 Encounter for examination for normal comparison and control in clinical research program: Secondary | ICD-10-CM | POA: Diagnosis not present

## 2021-08-26 DIAGNOSIS — Z888 Allergy status to other drugs, medicaments and biological substances status: Secondary | ICD-10-CM | POA: Diagnosis not present

## 2021-08-26 DIAGNOSIS — H9190 Unspecified hearing loss, unspecified ear: Secondary | ICD-10-CM | POA: Diagnosis present

## 2021-08-26 DIAGNOSIS — Z905 Acquired absence of kidney: Secondary | ICD-10-CM

## 2021-08-26 DIAGNOSIS — Z9049 Acquired absence of other specified parts of digestive tract: Secondary | ICD-10-CM

## 2021-08-26 DIAGNOSIS — Z7951 Long term (current) use of inhaled steroids: Secondary | ICD-10-CM

## 2021-08-26 DIAGNOSIS — Z87891 Personal history of nicotine dependence: Secondary | ICD-10-CM

## 2021-08-26 DIAGNOSIS — I2510 Atherosclerotic heart disease of native coronary artery without angina pectoris: Secondary | ICD-10-CM | POA: Diagnosis present

## 2021-08-26 DIAGNOSIS — Z85819 Personal history of malignant neoplasm of unspecified site of lip, oral cavity, and pharynx: Secondary | ICD-10-CM

## 2021-08-26 HISTORY — PX: CAROTID PTA/STENT INTERVENTION: CATH118231

## 2021-08-26 LAB — GLUCOSE, CAPILLARY
Glucose-Capillary: 109 mg/dL — ABNORMAL HIGH (ref 70–99)
Glucose-Capillary: 99 mg/dL (ref 70–99)
Glucose-Capillary: 126 mg/dL — ABNORMAL HIGH (ref 70–99)
Glucose-Capillary: 107 mg/dL — ABNORMAL HIGH (ref 70–99)

## 2021-08-26 LAB — CREATININE, SERUM
Creatinine, Ser: 1.22 mg/dL (ref 0.61–1.24)
GFR, Estimated: >60 mL/min (ref >60)

## 2021-08-26 LAB — POCT ACTIVATED CLOTTING TIME: Activated Clotting Time: 283 seconds

## 2021-08-26 LAB — BUN: BUN: 26 mg/dL — ABNORMAL HIGH (ref 8–23)

## 2021-08-26 SURGERY — CAROTID PTA/STENT INTERVENTION
Anesthesia: Moderate Sedation | Laterality: Right

## 2021-08-26 MED ORDER — FENTANYL CITRATE (PF) 100 MCG/2ML IJ SOLN
INTRAMUSCULAR | Status: DC | PRN
  Administered 2021-08-26: 50 ug via INTRAVENOUS

## 2021-08-26 MED ORDER — ONDANSETRON HCL 4 MG/2ML IJ SOLN: 4.0000 mg | Freq: Four times a day (QID) | INTRAMUSCULAR | Status: DC | PRN

## 2021-08-26 MED ORDER — CHLORTHALIDONE 25 MG PO TABS
25.0000 mg | ORAL_TABLET | Freq: Every day | ORAL | Status: DC
  Administered 2021-08-26: 25 mg via ORAL
  Filled 2021-08-26 (×2): qty 1

## 2021-08-26 MED ORDER — CEFAZOLIN SODIUM-DEXTROSE 2-4 GM/100ML-% IV SOLN
2.0000 g | Freq: Once | INTRAVENOUS | Status: AC
  Administered 2021-08-26: 2 g via INTRAVENOUS

## 2021-08-26 MED ORDER — CARVEDILOL 6.25 MG PO TABS
6.2500 mg | ORAL_TABLET | Freq: Two times a day (BID) | ORAL | Status: DC
  Administered 2021-08-27: 6.25 mg via ORAL
  Filled 2021-08-26 (×2): qty 1

## 2021-08-26 MED ORDER — INSULIN ASPART 100 UNIT/ML IJ SOLN
0.0000 [IU] | Freq: Three times a day (TID) | INTRAMUSCULAR | Status: DC
Start: 2021-08-26 — End: 2021-08-27
  Administered 2021-08-26 – 2021-08-27 (×2): 2 [IU] via SUBCUTANEOUS
  Filled 2021-08-26 (×2): qty 1

## 2021-08-26 MED ORDER — FENTANYL CITRATE (PF) 100 MCG/2ML IJ SOLN
INTRAMUSCULAR | Status: AC
  Filled 2021-08-26: qty 2

## 2021-08-26 MED ORDER — MIDAZOLAM HCL 2 MG/2ML IJ SOLN
INTRAMUSCULAR | Status: AC
  Filled 2021-08-26: qty 2

## 2021-08-26 MED ORDER — MIDAZOLAM HCL 2 MG/2ML IJ SOLN
INTRAMUSCULAR | Status: DC | PRN
  Administered 2021-08-26: 2 mg via INTRAVENOUS

## 2021-08-26 MED ORDER — KCL IN DEXTROSE-NACL 20-5-0.9 MEQ/L-%-% IV SOLN
INTRAVENOUS | Status: DC
  Filled 2021-08-26 (×5): qty 1000

## 2021-08-26 MED ORDER — DOCUSATE SODIUM 100 MG PO CAPS
100.0000 mg | ORAL_CAPSULE | Freq: Every day | ORAL | Status: DC
  Administered 2021-08-27: 100 mg via ORAL
  Filled 2021-08-26: qty 1

## 2021-08-26 MED ORDER — ACETAMINOPHEN 325 MG PO TABS
325.0000 mg | ORAL_TABLET | ORAL | Status: DC | PRN
  Administered 2021-08-26 (×2): 650 mg via ORAL
  Filled 2021-08-26 (×2): qty 2

## 2021-08-26 MED ORDER — IODIXANOL 320 MG/ML IV SOLN
INTRAVENOUS | Status: DC | PRN
  Administered 2021-08-26: 80 mL

## 2021-08-26 MED ORDER — ATROPINE SULFATE 1 MG/10ML IJ SOSY
PREFILLED_SYRINGE | INTRAMUSCULAR | Status: AC
  Filled 2021-08-26: qty 20

## 2021-08-26 MED ORDER — PHENYLEPHRINE HCL (PRESSORS) 10 MG/ML IV SOLN
INTRAVENOUS | Status: AC
  Filled 2021-08-26: qty 1

## 2021-08-26 MED ORDER — ATROPINE SULFATE 1 MG/10ML IJ SOSY
PREFILLED_SYRINGE | INTRAMUSCULAR | Status: DC | PRN
  Administered 2021-08-26: 1 mg via INTRAVENOUS

## 2021-08-26 MED ORDER — PHENOL 1.4 % MT LIQD
1.0000 | OROMUCOSAL | Status: DC | PRN
  Filled 2021-08-26: qty 177

## 2021-08-26 MED ORDER — METHYLPREDNISOLONE SODIUM SUCC 125 MG IJ SOLR: 125.0000 mg | Freq: Once | INTRAMUSCULAR | Status: DC | PRN

## 2021-08-26 MED ORDER — LABETALOL HCL 5 MG/ML IV SOLN: 10.0000 mg | INTRAVENOUS | Status: DC | PRN

## 2021-08-26 MED ORDER — VITAMIN D (ERGOCALCIFEROL) 1.25 MG (50000 UNIT) PO CAPS: 50000.0000 [IU] | ORAL_CAPSULE | ORAL | Status: DC

## 2021-08-26 MED ORDER — ALUM & MAG HYDROXIDE-SIMETH 200-200-20 MG/5ML PO SUSP: 15.0000 mL | ORAL | Status: DC | PRN

## 2021-08-26 MED ORDER — HYDRALAZINE HCL 20 MG/ML IJ SOLN: 5.0000 mg | INTRAMUSCULAR | Status: DC | PRN

## 2021-08-26 MED ORDER — FAMOTIDINE 20 MG PO TABS: 40.0000 mg | ORAL_TABLET | Freq: Once | ORAL | Status: DC | PRN

## 2021-08-26 MED ORDER — ACETAMINOPHEN 325 MG RE SUPP
325.0000 mg | RECTAL | Status: DC | PRN
  Filled 2021-08-26: qty 2

## 2021-08-26 MED ORDER — HEPARIN SODIUM (PORCINE) 1000 UNIT/ML IJ SOLN
INTRAMUSCULAR | Status: AC
  Filled 2021-08-26: qty 1

## 2021-08-26 MED ORDER — INSULIN ASPART PROT & ASPART (70-30 MIX) 100 UNIT/ML ~~LOC~~ SUSP
45.0000 [IU] | Freq: Two times a day (BID) | SUBCUTANEOUS | Status: DC
  Administered 2021-08-26 – 2021-08-27 (×2): 45 [IU] via SUBCUTANEOUS
  Filled 2021-08-26: qty 10

## 2021-08-26 MED ORDER — PANTOPRAZOLE SODIUM 40 MG IV SOLR
40.0000 mg | Freq: Every day | INTRAVENOUS | Status: DC
  Administered 2021-08-26: 40 mg via INTRAVENOUS
  Filled 2021-08-26: qty 40

## 2021-08-26 MED ORDER — SODIUM CHLORIDE 0.9 % IV SOLN: 500.0000 mL | Freq: Once | INTRAVENOUS | Status: DC | PRN

## 2021-08-26 MED ORDER — SODIUM CHLORIDE 0.9 % IV SOLN: INTRAVENOUS | Status: DC

## 2021-08-26 MED ORDER — FLUTICASONE PROPIONATE 50 MCG/ACT NA SUSP
2.0000 | Freq: Every day | NASAL | Status: DC | PRN
  Filled 2021-08-26: qty 16

## 2021-08-26 MED ORDER — MIDAZOLAM HCL 2 MG/ML PO SYRP: 8.0000 mg | ORAL_SOLUTION | Freq: Once | ORAL | Status: DC | PRN

## 2021-08-26 MED ORDER — HEPARIN SODIUM (PORCINE) 1000 UNIT/ML IJ SOLN
INTRAMUSCULAR | Status: DC | PRN
  Administered 2021-08-26: 8000 [IU] via INTRAVENOUS
  Administered 2021-08-26: 2000 [IU] via INTRAVENOUS

## 2021-08-26 MED ORDER — LISINOPRIL 20 MG PO TABS
10.0000 mg | ORAL_TABLET | Freq: Every day | ORAL | Status: DC
  Administered 2021-08-27: 10 mg via ORAL
  Filled 2021-08-26: qty 1

## 2021-08-26 MED ORDER — DIPHENHYDRAMINE HCL 50 MG/ML IJ SOLN: 50.0000 mg | Freq: Once | INTRAMUSCULAR | Status: DC | PRN

## 2021-08-26 MED ORDER — AMITRIPTYLINE HCL 75 MG PO TABS
75.0000 mg | ORAL_TABLET | Freq: Every day | ORAL | Status: DC
  Administered 2021-08-26: 75 mg via ORAL
  Filled 2021-08-26 (×2): qty 1

## 2021-08-26 MED ORDER — PHENYLEPHRINE 40 MCG/ML (10ML) SYRINGE FOR IV PUSH (FOR BLOOD PRESSURE SUPPORT)
PREFILLED_SYRINGE | INTRAVENOUS | Status: DC | PRN
  Administered 2021-08-26 (×2): 80 ug via INTRAVENOUS

## 2021-08-26 MED ORDER — GUAIFENESIN-DM 100-10 MG/5ML PO SYRP: 15.0000 mL | ORAL_SOLUTION | ORAL | Status: DC | PRN

## 2021-08-26 MED ORDER — CLOPIDOGREL BISULFATE 75 MG PO TABS
75.0000 mg | ORAL_TABLET | Freq: Every day | ORAL | Status: DC
  Administered 2021-08-26 – 2021-08-27 (×2): 75 mg via ORAL
  Filled 2021-08-26 (×2): qty 1

## 2021-08-26 MED ORDER — INSULIN ASPART 100 UNIT/ML IJ SOLN: 0.0000 [IU] | Freq: Every day | INTRAMUSCULAR | Status: DC

## 2021-08-26 MED ORDER — TAMSULOSIN HCL 0.4 MG PO CAPS
0.4000 mg | ORAL_CAPSULE | Freq: Every day | ORAL | Status: DC
  Administered 2021-08-26: 0.4 mg via ORAL
  Filled 2021-08-26: qty 1

## 2021-08-26 MED ORDER — ASPIRIN EC 81 MG PO TBEC
81.0000 mg | DELAYED_RELEASE_TABLET | Freq: Every day | ORAL | Status: DC
  Administered 2021-08-27: 81 mg via ORAL
  Filled 2021-08-26: qty 1

## 2021-08-26 MED ORDER — CEFAZOLIN SODIUM-DEXTROSE 2-4 GM/100ML-% IV SOLN
2.0000 g | Freq: Three times a day (TID) | INTRAVENOUS | Status: AC
  Administered 2021-08-26 – 2021-08-27 (×2): 2 g via INTRAVENOUS
  Filled 2021-08-26 (×2): qty 100

## 2021-08-26 MED ORDER — SUCRALFATE 1 G PO TABS
1.0000 g | ORAL_TABLET | Freq: Three times a day (TID) | ORAL | Status: DC
  Administered 2021-08-26 – 2021-08-27 (×4): 1 g via ORAL
  Filled 2021-08-26 (×4): qty 1

## 2021-08-26 MED ORDER — ROSUVASTATIN CALCIUM 20 MG PO TABS
40.0000 mg | ORAL_TABLET | Freq: Every day | ORAL | Status: DC
  Administered 2021-08-27: 40 mg via ORAL
  Filled 2021-08-26: qty 4
  Filled 2021-08-26: qty 2
  Filled 2021-08-26: qty 4

## 2021-08-26 MED ORDER — METOPROLOL TARTRATE 5 MG/5ML IV SOLN: 2.0000 mg | INTRAVENOUS | Status: DC | PRN

## 2021-08-26 MED ORDER — HYDROMORPHONE HCL 1 MG/ML IJ SOLN: 1.0000 mg | Freq: Once | INTRAMUSCULAR | Status: DC | PRN

## 2021-08-26 MED ORDER — DOPAMINE-DEXTROSE 3.2-5 MG/ML-% IV SOLN
INTRAVENOUS | Status: AC
  Filled 2021-08-26: qty 250

## 2021-08-26 MED ORDER — ASPIRIN EC 81 MG PO TBEC
81.0000 mg | DELAYED_RELEASE_TABLET | Freq: Every day | ORAL | Status: DC
  Administered 2021-08-26: 81 mg via ORAL
  Filled 2021-08-26: qty 1

## 2021-08-26 MED ORDER — CHLORHEXIDINE GLUCONATE CLOTH 2 % EX PADS
6.0000 | MEDICATED_PAD | Freq: Every day | CUTANEOUS | Status: DC
  Administered 2021-08-26 – 2021-08-27 (×2): 6 via TOPICAL

## 2021-08-26 SURGICAL SUPPLY — 24 items
BALLN VIATRAC 4X30X135 (BALLOONS) ×2
BALLN VIATRAC 5X20X135 (BALLOONS) ×2
BALLOON VIATRAC 4X30X135 (BALLOONS) ×1 IMPLANT
BALLOON VIATRAC 5X20X135 (BALLOONS) ×1 IMPLANT
CATH ANGIO 5F PIGTAIL 100CM (CATHETERS) ×2 IMPLANT
CATH BEACON 5 .035 100 H1 TIP (CATHETERS) ×2 IMPLANT
COVER PROBE U/S 5X48 (MISCELLANEOUS) ×2 IMPLANT
DEVICE EMBOSHIELD NAV6 4.0-7.0 (FILTER) ×2 IMPLANT
DEVICE SAFEGUARD 24CM (GAUZE/BANDAGES/DRESSINGS) ×2 IMPLANT
DEVICE STARCLOSE SE CLOSURE (Vascular Products) ×2 IMPLANT
DEVICE TORQUE .025-.038 (MISCELLANEOUS) ×2 IMPLANT
GLIDEWIRE ANGLED SS 035X260CM (WIRE) ×2 IMPLANT
GUIDEWIRE SUPER STIFF .035X180 (WIRE) ×2 IMPLANT
GUIDEWIRE VASC STIFF .038X260 (WIRE) ×2 IMPLANT
KIT ENCORE 26 ADVANTAGE (KITS) ×2 IMPLANT
KIT MICROPUNCTURE NIT STIFF (SHEATH) ×2 IMPLANT
NEEDLE ENTRY 21GA 7CM ECHOTIP (NEEDLE) ×2 IMPLANT
PACK ANGIOGRAPHY (CUSTOM PROCEDURE TRAY) ×2 IMPLANT
SET INTRO CAPELLA COAXIAL (SET/KITS/TRAYS/PACK) ×2 IMPLANT
SHEATH BRITE TIP 5FRX11 (SHEATH) ×2 IMPLANT
SHEATH SHUTTLE SELECT 6F (SHEATH) ×2 IMPLANT
STENT XACT CAR 10-8X40X136 (Permanent Stent) ×2 IMPLANT
TUBING CONTRAST HIGH PRESS 48 (TUBING) ×4 IMPLANT
WIRE GUIDERIGHT .035X150 (WIRE) ×2 IMPLANT

## 2021-08-26 NOTE — Op Note (Signed)
OPERATIVE NOTE DATE: 08/26/2021  PROCEDURE:  Ultrasound guidance for vascular access right femoral artery  Placement of a 10 x 8 x 40 exact stent with the use of the NAV-6 embolic protection device in the right internal carotid artery  PRE-OPERATIVE DIAGNOSIS: 1.  Symptomatic 80% right who is bleeding carotid artery stenosis. 2.  Hypertension associated with diabetes and hypercholesterolemia  POST-OPERATIVE DIAGNOSIS:  Same as above  SURGEON: Hortencia Pilar  ASSISTANT(S): None  ANESTHESIA: local/MCS  ESTIMATED BLOOD LOSS: 100 cc  CONTRAST: 80 cc  FLUORO TIME: 9.5 minutes  MODERATE CONSCIOUS SEDATION TIME: Continuous ECG pulse oximetry and cardiopulmonary monitoring was performed throughout the entire procedure by the interventional radiology nurse total sedation time was 1 hour 15 minutes.  FINDING(S): 1.   80% right internal carotid artery stenosis extending into the bulb  SPECIMEN(S):   none  INDICATIONS:   Patient is a 75 y.o. male who presents with symptomatic right carotid artery stenosis.  The patient has very disadvantaged anatomy for surgery and carotid artery stenting was felt to be preferred to endarterectomy for that reason.  Risks and benefits were discussed and informed consent was obtained.   DESCRIPTION: After obtaining full informed written consent, the patient was brought back to the vascular suite and placed supine upon the table.  The patient received IV antibiotics prior to induction. Moderate conscious sedation was administered during a face to face encounter with the patient throughout the procedure with my supervision of the RN administering medicines and monitoring the patients vital signs and mental status throughout from the start of the procedure until the patient was taken to the recovery room.    After obtaining adequate sedation, the patient was prepped and draped in the standard fashion.    A first assistant is required in order to allow for  a safe and more efficient operation.  Duties include wire manipulations as well as assistance with pinning the sheath and positioning the detector for proper angle, assistance and deploying the stent in the proper position and appropriate images.  Further duties include assisting with patient positioning during the procedure.  I believe that this procedure requires a first assistant in order for it to be performed at a level in keeping with the high standards of this institution.  The right femoral artery was visualized with ultrasound and found to be widely patent. It was then accessed under direct ultrasound guidance without difficulty with a micropuncture needle. A permanent image was recorded.  A microwire was then advanced without difficulty under fluoroscopic guidance followed by a micro-sheath.  A J-wire was placed and we then placed a 6 French sheath. The patient was then heparinized and a total of 8000 units of intravenous heparin were given and an ACT was checked to confirm successful anticoagulation.  ACT was returned 280 and an additional 2000 units of heparin was given  A pigtail catheter was then placed into the ascending aorta. This showed type II arch with the origins of the great vessels to be widely patent. The left common artery was then selectively cannulated without difficulty with a H1 catheter and the catheter advanced into the mid left common carotid artery.  Hand-injection of contrast was then performed in the AP and lateral projections this demonstrated less than 30% stenosis of the left internal carotid artery (the preop CT scan appeared to have a greater than 80% stenosis on the left as well and discussion regarding imaging of the left to determine whether stenting on the  left was needed in the future had been undertaken with the patient preoperatively).  The wire was then reintroduced and the H1 catheter was negotiated first into the and [place] and then into the right common  carotid artery.  Cervical and cerebral carotid angiography was then performed. There were no obvious intracranial filling defects. The carotid bifurcation demonstrated 80% stenosis best noted in the lateral projection.  Severe tortuosity of the right internal carotid artery was identified.  On initial imaging there were no atherosclerotic lesions greater than 60% identified.  However, the right anterior cerebral did not fill preintervention.  I then advanced into the external carotid artery with a Glidewire and the H1 catheter and then exchanged for the Amplatz Super Stiff wire. Over the Amplatz Super Stiff wire, a 6 [place] shuttle sheath was placed into the mid common carotid artery. I then used the NAV-6  Embolic protection device and crossed the lesion and parked this in the distal internal carotid artery at the base of the skull.  I then selected a 10 x 8 x 40 mm Exact stent. This was deployed across the lesion encompassing it in its entirety. A 4 mm x 30 mm length balloon was used to post dilate the stent.  Significant residual stenosis was noted on follow-up imaging and therefore a 5 mm x 20 mm via track balloon was advanced across the lesion and inflated to 10 atm.  Follow-up imaging noted only about a 20% residual stenosis was present after angioplasty. Completion angiogram showed normal intracranial filling without new defects there is now brisk filling of the right anterior cerebral.  At this point I elected to terminate the procedure. The sheath was removed and StarClose closure device was deployed in the right femoral artery with excellent hemostatic result. The patient was taken to the recovery room in stable condition having tolerated the procedure well.  COMPLICATIONS: none  CONDITION: stable  Hortencia Pilar 08/26/2021 9:54 AM   This note was created with Dragon Medical transcription system. Any errors in dictation are purely unintentional.

## 2021-08-26 NOTE — Interval H&P Note (Signed)
History and Physical Interval Note:  08/26/2021 8:01 AM  [name]  has presented today for surgery, with the diagnosis of RT Carotid Stent  ABBOTT Lanny Hurst)   Carotid artery stenosis.  The various methods of treatment have been discussed with the patient and family. After consideration of risks, benefits and other options for treatment, the patient has consented to  Procedure(s): CAROTID PTA/STENT INTERVENTION (Right) as a surgical intervention.  The patient's history has been reviewed, patient examined, no change in status, stable for surgery.  I have reviewed the patient's chart and labs.  Questions were answered to the patient's satisfaction.     Hortencia Pilar

## 2021-08-27 DIAGNOSIS — I6521 Occlusion and stenosis of right carotid artery: Principal | ICD-10-CM

## 2021-08-27 LAB — CBC
HCT: 39.9 % (ref 39.0–52.0)
Hemoglobin: 12.9 g/dL — ABNORMAL LOW (ref 13.0–17.0)
MCH: 27 pg (ref 26.0–34.0)
MCHC: 32.3 g/dL (ref 30.0–36.0)
MCV: 83.5 fL (ref 80.0–100.0)
Platelets: 122 10*3/uL — ABNORMAL LOW (ref 150–400)
RBC: 4.78 MIL/uL (ref 4.22–5.81)
RDW: 15.9 % — ABNORMAL HIGH (ref 11.5–15.5)
WBC: 8.1 10*3/uL (ref 4.0–10.5)
nRBC: 0 % (ref 0.0–0.2)

## 2021-08-27 LAB — BASIC METABOLIC PANEL
Anion gap: 5 (ref 5–15)
BUN: 23 mg/dL (ref 8–23)
CO2: 25 mmol/L (ref 22–32)
Calcium: 9.8 mg/dL (ref 8.9–10.3)
Chloride: 109 mmol/L (ref 98–111)
Creatinine, Ser: 1.13 mg/dL (ref 0.61–1.24)
GFR, Estimated: >60 mL/min (ref >60)
Glucose, Bld: 88 mg/dL (ref 70–99)
Potassium: 3.8 mmol/L (ref 3.5–5.1)
Sodium: 139 mmol/L (ref 135–145)

## 2021-08-27 LAB — HEMOGLOBIN A1C
Hgb A1c MFr Bld: 6.8 % — ABNORMAL HIGH (ref 4.8–5.6)
Mean Plasma Glucose: 148 mg/dL

## 2021-08-27 LAB — GLUCOSE, CAPILLARY
Glucose-Capillary: 128 mg/dL — ABNORMAL HIGH (ref 70–99)
Glucose-Capillary: 109 mg/dL — ABNORMAL HIGH (ref 70–99)

## 2021-08-27 NOTE — Discharge Summary (Signed)
[facility]    Discharge Summary  Patient ID:  [name] MRN: [id number] DOB/AGE: 73-May-1947 73 y.o.  Admit date: 08/26/2021 Discharge date: 08/27/2021 Date of Surgery: 08/26/2021 Surgeon: Surgeon(s): [name], Dolores Lory, MD  Admission Diagnosis: Carotid stenosis, symptomatic w/o infarct, right [I65.21]  Discharge Diagnoses:  Carotid stenosis, symptomatic w/o infarct, right [I65.21]  Secondary Diagnoses: Past Medical History:  Diagnosis Date   Back pain    Bladder cancer ([place])    COVID-19 09/10/2020   Received Casirivimab/Irdevimab infusion on 09/11/20   Diabetes mellitus without complication (HCC)    HOH (hard of hearing)    Hypertension    Obesity    Renal cancer ([place])    Renal cell cancer ([place])    Throat cancer ([place])    Tremor    Procedure(s): 08/26/21: Endovascular right carotid artery stent placement  Discharged Condition: Good  HPI / Hospital Course:  The patient is a 75 year old male who presented with symptoms of artery stenosis.  August 26, 2021 the patient underwent repair.  The patient tolerated procedure well was transferred from the angiography suite to the ICU for observation overnight.  The patient's site of surgery was unremarkable.  During patient's brief stay, his diet was advanced, he was urinating independently, his pain was controlled to the use of p.o. pain medication and he was ambulating at baseline.  Day of discharge, the patient was afebrile with stable vital signs and essentially unremarkable physical exam.  Physical Exam:  Alert notes x3, no acute distress Face: Symmetrical.  Tongue is midline. Neck: Trachea is midline.  No swelling or bruising. Cardiovascular: Regular rate and rhythm Pulmonary: Clear to auscultation bilaterally Abdomen: Soft, nontender, nondistended Right groin access: Clean dry and intact.  No swelling or drainage noted Left groin access: Clean dry and intact.  No swelling or drainage  noted Left lower extremity: Thigh soft.  Calf soft.  Extremities warm distally toes.  Hard to palpate pedal pulses however the foot is warm is her good capillary refill. Right lower extremity: Thigh soft.  Calf soft.  Extremities warm distally toes.  Hard to palpate pedal pulses however the foot is warm is her good capillary refill. Neurological: No deficits noted  Labs: As below  Complications: None  Consults: None  Significant Diagnostic Studies: CBC Lab Results  Component Value Date   WBC 8.1 08/27/2021   HGB 12.9 (L) 08/27/2021   HCT 39.9 08/27/2021   MCV 83.5 08/27/2021   PLT 122 (L) 08/27/2021   BMET    Component Value Date/Time   NA 139 08/27/2021 0447   K 3.8 08/27/2021 0447   CL 109 08/27/2021 0447   CO2 25 08/27/2021 0447   GLUCOSE 88 08/27/2021 0447   BUN 23 08/27/2021 0447   CREATININE 1.13 08/27/2021 0447   CALCIUM 9.8 08/27/2021 0447   GFRNONAA >60 08/27/2021 0447   GFRAA >60 03/25/2018 1218   COAG Lab Results  Component Value Date   INR 1.18 03/23/2018   Disposition:  Discharge to :Home  Allergies as of 08/27/2021       Reactions   Metformin Diarrhea        Medication List     TAKE these medications    amitriptyline 25 MG tablet Commonly known as: ELAVIL Take 75 mg by mouth at bedtime.   aspirin EC 81 MG tablet Take 81 mg by mouth daily. Swallow whole.   carvedilol 6.25 MG tablet Commonly known as: COREG Take 6.25 mg by mouth 2 (two)  times daily with a meal.   chlorthalidone 25 MG tablet Commonly known as: HYGROTON Take 25 mg by mouth at bedtime.   clopidogrel 75 MG tablet Commonly known as: Plavix Take 1 tablet (75 mg total) by mouth daily.   empagliflozin 25 MG Tabs tablet Commonly known as: JARDIANCE Take 25 mg by mouth daily.   fluticasone 50 MCG/ACT nasal spray Commonly known as: FLONASE Place 2 sprays into both nostrils daily as needed for allergies or rhinitis.   glimepiride 4 MG tablet Commonly known as:  AMARYL Take 4 mg by mouth 2 (two) times daily.   insulin NPH-regular Human (70-30) 100 UNIT/ML injection Inject 45 Units into the skin 2 (two) times daily with a meal.   lisinopril 10 MG tablet Commonly known as: ZESTRIL Take 10 mg by mouth daily.   rosuvastatin 40 MG tablet Commonly known as: CRESTOR Take 40 mg by mouth daily.   sucralfate 1 g tablet Commonly known as: Carafate Take 1 tablet (1 g total) by mouth 3 (three) times daily. Dissolve in 3-4 tbsp warm water, swish and swallow.   tamsulosin 0.4 MG Caps capsule Commonly known as: FLOMAX Take 0.4 mg by mouth at bedtime.   Vitamin D3 1.25 MG (50000 UT) Caps Take 50,000 Units by mouth every Saturday.       Verbal and written Discharge instructions given to the patient. Wound care per Discharge AVS  Follow-up Information     [name], Dolores Lory, MD Follow up in 1 month(s).   Specialties: Vascular Surgery, Cardiology, Radiology, Vascular Surgery Why: Can see [name] or Arna Medici. First postop. Will need carotid duplex with visit. Contact information: [place] [address] [id number]                Signed: Sela Hua, PA-C 08/27/2021, 4:28 PM

## 2021-08-27 NOTE — Discharge Instructions (Addendum)
Vascular Surgery Discharge Instructions:  1) you may shower as of Friday.  Please gently clean your groins with soap and water.  Gently pat dry. 2) please do not engage in strenuous activity or lifting greater than 10 pounds until you are cleared at your first postoperative follow-up 3) please do not drive for at least 2 weeks

## 2021-08-29 LAB — MRSA CULTURE: Culture: NORMAL

## 2021-09-11 DIAGNOSIS — I6521 Occlusion and stenosis of right carotid artery: Secondary | ICD-10-CM | POA: Diagnosis not present

## 2021-09-11 DIAGNOSIS — E119 Type 2 diabetes mellitus without complications: Secondary | ICD-10-CM | POA: Diagnosis not present

## 2021-09-11 DIAGNOSIS — E782 Mixed hyperlipidemia: Secondary | ICD-10-CM | POA: Diagnosis not present

## 2021-09-11 DIAGNOSIS — I10 Essential (primary) hypertension: Secondary | ICD-10-CM | POA: Diagnosis not present

## 2021-09-11 DIAGNOSIS — Z23 Encounter for immunization: Secondary | ICD-10-CM | POA: Diagnosis not present

## 2021-09-26 ENCOUNTER — Other Ambulatory Visit (INDEPENDENT_AMBULATORY_CARE_PROVIDER_SITE_OTHER): Payer: Self-pay | Admitting: Vascular Surgery

## 2021-09-26 DIAGNOSIS — Z959 Presence of cardiac and vascular implant and graft, unspecified: Secondary | ICD-10-CM

## 2021-09-26 DIAGNOSIS — I6523 Occlusion and stenosis of bilateral carotid arteries: Secondary | ICD-10-CM

## 2021-09-29 ENCOUNTER — Ambulatory Visit (INDEPENDENT_AMBULATORY_CARE_PROVIDER_SITE_OTHER): Payer: PPO | Admitting: Nurse Practitioner

## 2021-09-29 ENCOUNTER — Other Ambulatory Visit: Payer: Self-pay

## 2021-09-29 ENCOUNTER — Ambulatory Visit (INDEPENDENT_AMBULATORY_CARE_PROVIDER_SITE_OTHER): Payer: PPO

## 2021-09-29 VITALS — BP 148/79 | HR 80 | Ht 72.0 in | Wt 233.0 lb

## 2021-09-29 DIAGNOSIS — I6523 Occlusion and stenosis of bilateral carotid arteries: Secondary | ICD-10-CM

## 2021-09-29 DIAGNOSIS — Z959 Presence of cardiac and vascular implant and graft, unspecified: Secondary | ICD-10-CM

## 2021-09-29 DIAGNOSIS — E119 Type 2 diabetes mellitus without complications: Secondary | ICD-10-CM

## 2021-09-29 DIAGNOSIS — E7800 Pure hypercholesterolemia, unspecified: Secondary | ICD-10-CM

## 2021-09-29 DIAGNOSIS — Z794 Long term (current) use of insulin: Secondary | ICD-10-CM

## 2021-10-05 ENCOUNTER — Encounter (INDEPENDENT_AMBULATORY_CARE_PROVIDER_SITE_OTHER): Payer: Self-pay | Admitting: Nurse Practitioner

## 2021-10-05 NOTE — Progress Notes (Signed)
Subjective:    Patient ID: [name], male    DOB: 07-07-1946, 75 y.o.   MRN: [id number] Chief Complaint  Patient presents with   Follow-up    [name] post Op can see schnier or [name] first post op will need carotid duplex with visit     The patient is seen for follow up evaluation of carotid stenosis status post right carotid stent placement on 08/26/2021.  There were no post operative problems or complications related to the surgery.  The patient denies neck or incisional pain.  The patient denies interval amaurosis fugax. There is no recent history of TIA symptoms or focal motor deficits. There is no prior documented CVA.  The patient denies headache.  The patient is taking enteric-coated aspirin 81 mg daily.  The patient has a history of coronary artery disease, no recent episodes of angina or shortness of breath. The patient denies PAD or claudication symptoms. There is a history of hyperlipidemia which is being treated with a statin.    Patient has a 1 to 39% stenosis noted bilaterally.  The right ICA stent is patent with no evidence of restenosis.  There is normal flow hemodynamics in the bilateral subclavian arteries with antegrade flow in the bilateral vertebrals.   Review of Systems  Eyes:  Negative for visual disturbance.  All other systems reviewed and are negative.     Objective:   Physical Exam Vitals reviewed.  HENT:     Head: Normocephalic.  Neck:     Vascular: No carotid bruit.  Cardiovascular:     Rate and Rhythm: Normal rate.  Pulmonary:     Effort: Pulmonary effort is normal.  Skin:    General: Skin is warm and dry.  Neurological:     Mental Status: He is alert and oriented to person, place, and time.  Psychiatric:        Mood and Affect: Mood normal.        Behavior: Behavior normal.        Thought Content: Thought content normal.        Judgment: Judgment normal.    BP (!) 148/79   Pulse 80   Ht 6' (1.829 m)   Wt 233 lb (105.7 kg)    BMI 31.60 kg/m   Past Medical History:  Diagnosis Date   Back pain    Bladder cancer ([place])    COVID-19 09/10/2020   Received Casirivimab/Irdevimab infusion on 09/11/20   Diabetes mellitus without complication (HCC)    HOH (hard of hearing)    Hypertension    Obesity    Renal cancer ([place])    Renal cell cancer ([place])    Throat cancer ([place])    Tremor     Social History   Socioeconomic History   Marital status: Married    Spouse name: Not on file   Number of children: Not on file   Years of education: Not on file   Highest education level: Not on file  Occupational History   Not on file  Tobacco Use   Smoking status: Former    Types: Cigarettes    Quit date: 1991    Years since quitting: 31.7   Smokeless tobacco: Never  Vaping Use   Vaping Use: Never used  Substance and Sexual Activity   Alcohol use: Yes    Comment: Occasional beer drinker   Drug use: No   Sexual activity: Yes  Other Topics Concern   Not on file  Social History  Narrative   Lives at home with wife, active and independent at baseline   Social Determinants of Health   Financial Resource Strain: Not on file  Food Insecurity: Not on file  Transportation Needs: Not on file  Physical Activity: Not on file  Stress: Not on file  Social Connections: Not on file  Intimate Partner Violence: Not on file    Past Surgical History:  Procedure Laterality Date   APPENDECTOMY     BLADDER SURGERY     CAROTID PTA/STENT INTERVENTION Right 08/26/2021   Procedure: CAROTID PTA/STENT INTERVENTION;  Surgeon: Delana Meyer Dolores Lory, MD;  Location: [facility];  Service: Cardiovascular;  Laterality: Right;   CERVICAL SPINE SURGERY     CHOLECYSTECTOMY N/A 03/23/2018   Procedure: LAPAROSCOPIC CHOLECYSTECTOMY;  Surgeon: Herbert Pun, MD;  Location: [name];  Service: General;  Laterality: N/A;   COLONOSCOPY     COLONOSCOPY WITH PROPOFOL N/A 11/10/2017   Procedure: COLONOSCOPY WITH PROPOFOL;  Surgeon:  Toledo, Benay Pike, MD;  Location: [name];  Service: Gastroenterology;  Laterality: N/A;   ESOPHAGOGASTRODUODENOSCOPY (EGD) WITH PROPOFOL N/A 11/10/2017   Procedure: ESOPHAGOGASTRODUODENOSCOPY (EGD) WITH PROPOFOL;  Surgeon: Toledo, Benay Pike, MD;  Location: [name];  Service: Gastroenterology;  Laterality: N/A;   HERNIA REPAIR     left od surgery     MICROLARYNGOSCOPY N/A 11/05/2020   Procedure: MICROLARYNGOSCOPY WITH BIOPSIES;  Surgeon: Clyde Canterbury, MD;  Location: [facility];  Service: ENT;  Laterality: N/A;   NEPHRECTOMY     right kidney nephrectomy   rt elbow surgery     THROAT SURGERY  1990   for [facility] removal at [facility]    Family History  Problem Relation Age of Onset   Diabetes Brother    Hypertension Brother    CAD Brother    CAD Brother     Allergies  Allergen Reactions   Metformin Diarrhea    CBC Latest Ref Rng & Units 08/27/2021 01/14/2021 01/06/2021  WBC 4.0 - 10.5 K/uL 8.1 9.1 8.9  Hemoglobin 13.0 - 17.0 g/dL 12.9(L) 15.3 15.5  Hematocrit 39.0 - 52.0 % 39.9 47.0 47.7  Platelets 150 - 400 K/uL 122(L) 163 160      CMP     Component Value Date/Time   NA 139 08/27/2021 0447   K 3.8 08/27/2021 0447   CL 109 08/27/2021 0447   CO2 25 08/27/2021 0447   GLUCOSE 88 08/27/2021 0447   BUN 23 08/27/2021 0447   CREATININE 1.13 08/27/2021 0447   CALCIUM 9.8 08/27/2021 0447   PROT 7.8 03/25/2018 1218   ALBUMIN 2.6 (L) 03/25/2018 1218   AST 34 03/25/2018 1218   ALT 47 03/25/2018 1218   ALKPHOS 163 (H) 03/25/2018 1218   BILITOT 1.0 03/25/2018 1218   GFRNONAA >60 08/27/2021 0447   GFRAA >60 03/25/2018 1218     No results found.     Assessment & Plan:   1. Bilateral carotid artery stenosis Recommend:  The patient is s/p successful right stent placement  Duplex ultrasound preoperatively shows 1 to 39% contralateral stenosis.  Continue antiplatelet therapy as prescribed Continue management of CAD, HTN and Hyperlipidemia Healthy  heart diet,  encouraged exercise at least 4 times per week  Follow up in 3 months with duplex ultrasound and physical exam based on the patient's carotid stenting - VAS US CAROTID; Future  2. Insulin dependent type 2 diabetes mellitus ([place]) Continue hypoglycemic medications as already ordered, these medications have been reviewed and there are no changes  at this time.  Hgb A1C to be monitored as already arranged by primary service   3. Pure hypercholesterolemia Continue statin as ordered and reviewed, no changes at this time    Current Outpatient Medications on File Prior to Visit  Medication Sig Dispense Refill   amitriptyline (ELAVIL) 25 MG tablet Take 75 mg by mouth at bedtime.      aspirin EC 81 MG tablet Take 81 mg by mouth daily. Swallow whole.     carvedilol (COREG) 6.25 MG tablet Take 6.25 mg by mouth 2 (two) times daily with a meal.     chlorthalidone (HYGROTON) 25 MG tablet Take 25 mg by mouth at bedtime.     Cholecalciferol (DIALYVITE VITAMIN D3 MAX) 1.25 MG (50000 UT) TABS Dialyvite Vitamin D3 Max 1.25 MG (50000 UT) Oral Tablet QTY: 12 tablet Days: 84 Refills: 3  Written: 02/11/21 Patient Instructions: once a week     Cholecalciferol (VITAMIN D3) 1.25 MG (50000 UT) CAPS Take 50,000 Units by mouth every Saturday.     clopidogrel (PLAVIX) 75 MG tablet Take 1 tablet (75 mg total) by mouth daily. 30 tablet 5   empagliflozin (JARDIANCE) 25 MG TABS tablet Take 25 mg by mouth daily.     fluticasone (FLONASE) 50 MCG/ACT nasal spray Place 2 sprays into both nostrils daily as needed for allergies or rhinitis.     glimepiride (AMARYL) 4 MG tablet Take 4 mg by mouth 2 (two) times daily.      insulin NPH-regular Human (NOVOLIN 70/30) (70-30) 100 UNIT/ML injection Inject 45 Units into the skin 2 (two) times daily with a meal.      lisinopril (PRINIVIL,ZESTRIL) 10 MG tablet Take 10 mg by mouth daily.     rosuvastatin (CRESTOR) 40 MG tablet Take 40 mg by mouth daily.     sucralfate  (CARAFATE) 1 g tablet Take 1 tablet (1 g total) by mouth 3 (three) times daily. Dissolve in 3-4 tbsp warm water, swish and swallow. 90 tablet 1   tamsulosin (FLOMAX) 0.4 MG CAPS capsule Take 0.4 mg by mouth at bedtime.     No current facility-administered medications on file prior to visit.    There are no Patient Instructions on file for this visit. Return in about 3 months (around 12/30/2021) for Carotid stenosis;GS/FB.   Kris Hartmann, NP

## 2021-10-23 DIAGNOSIS — E119 Type 2 diabetes mellitus without complications: Secondary | ICD-10-CM | POA: Diagnosis not present

## 2021-10-23 DIAGNOSIS — E782 Mixed hyperlipidemia: Secondary | ICD-10-CM | POA: Diagnosis not present

## 2021-10-23 DIAGNOSIS — I10 Essential (primary) hypertension: Secondary | ICD-10-CM | POA: Diagnosis not present

## 2021-11-04 DIAGNOSIS — E119 Type 2 diabetes mellitus without complications: Secondary | ICD-10-CM | POA: Diagnosis not present

## 2021-11-04 DIAGNOSIS — N400 Enlarged prostate without lower urinary tract symptoms: Secondary | ICD-10-CM | POA: Diagnosis not present

## 2021-11-04 DIAGNOSIS — D509 Iron deficiency anemia, unspecified: Secondary | ICD-10-CM | POA: Diagnosis not present

## 2021-11-04 DIAGNOSIS — I10 Essential (primary) hypertension: Secondary | ICD-10-CM | POA: Diagnosis not present

## 2021-11-04 DIAGNOSIS — E782 Mixed hyperlipidemia: Secondary | ICD-10-CM | POA: Diagnosis not present

## 2021-11-10 ENCOUNTER — Telehealth (INDEPENDENT_AMBULATORY_CARE_PROVIDER_SITE_OTHER): Payer: Self-pay

## 2021-11-10 NOTE — Telephone Encounter (Signed)
Patient spouse left a voicemail requesting for Clopidogrel 75 mg to be called to [facility] on [address]. Patient spouse was made aware that prescription was left on pharmacy voicemail for Clopidogrel 75mg  take 1 tablet by mouth daily #30 with 5 refills.

## 2021-12-01 ENCOUNTER — Ambulatory Visit (INDEPENDENT_AMBULATORY_CARE_PROVIDER_SITE_OTHER): Payer: PPO | Admitting: Nurse Practitioner

## 2021-12-01 ENCOUNTER — Ambulatory Visit (INDEPENDENT_AMBULATORY_CARE_PROVIDER_SITE_OTHER): Payer: PPO

## 2021-12-01 ENCOUNTER — Other Ambulatory Visit: Payer: Self-pay

## 2021-12-01 ENCOUNTER — Encounter (INDEPENDENT_AMBULATORY_CARE_PROVIDER_SITE_OTHER): Payer: Self-pay | Admitting: Nurse Practitioner

## 2021-12-01 VITALS — BP 114/72 | HR 76 | Ht 66.0 in | Wt 235.0 lb

## 2021-12-01 DIAGNOSIS — I10 Essential (primary) hypertension: Secondary | ICD-10-CM | POA: Diagnosis not present

## 2021-12-01 DIAGNOSIS — E7800 Pure hypercholesterolemia, unspecified: Secondary | ICD-10-CM

## 2021-12-01 DIAGNOSIS — I6521 Occlusion and stenosis of right carotid artery: Secondary | ICD-10-CM | POA: Diagnosis not present

## 2021-12-01 DIAGNOSIS — I6523 Occlusion and stenosis of bilateral carotid arteries: Secondary | ICD-10-CM

## 2021-12-02 ENCOUNTER — Encounter (INDEPENDENT_AMBULATORY_CARE_PROVIDER_SITE_OTHER): Payer: Self-pay | Admitting: Nurse Practitioner

## 2021-12-02 NOTE — Progress Notes (Signed)
Subjective:    Patient ID: [name], male    DOB: [birth year], 75 y.o.   MRN: [id number] Chief Complaint  Patient presents with   Follow-up    3 mo Carotid     [name] is a 75 year old male that is seen for follow up evaluation of carotid stenosis. The carotid stenosis followed by ultrasound.  The patient had a right ICA stent placed on 08/26/2021  The patient denies amaurosis fugax. There is no recent history of TIA symptoms or focal motor deficits. There is no prior documented CVA.  The patient is taking enteric-coated aspirin 81 mg daily.  There is no history of migraine headaches. There is no history of seizures.  The patient has a history of coronary artery disease, no recent episodes of angina or shortness of breath. The patient denies PAD or claudication symptoms. There is a history of hyperlipidemia which is being treated with a statin.    Carotid Duplex done today shows 1 to 39% stenosis bilaterally.  Patent right ICA stent.  No change compared to last study in 09/29/2021   Review of Systems  Eyes:  Negative for visual disturbance.  Neurological:  Negative for weakness.  All other systems reviewed and are negative.     Objective:   Physical Exam Vitals reviewed.  HENT:     Head: Normocephalic.  Neck:     Vascular: No carotid bruit.  Cardiovascular:     Rate and Rhythm: Normal rate.     Pulses: Normal pulses.  Pulmonary:     Effort: Pulmonary effort is normal.  Skin:    General: Skin is warm and dry.  Neurological:     Mental Status: He is alert and oriented to person, place, and time.  Psychiatric:        Mood and Affect: Mood normal.        Behavior: Behavior normal.        Thought Content: Thought content normal.        Judgment: Judgment normal.    BP 114/72   Pulse 76   Ht 5\' 6"  (1.676 m)   Wt 235 lb (106.6 kg)   BMI 37.93 kg/m   Past Medical History:  Diagnosis Date   Back pain    Bladder cancer ([place])    COVID-19 09/10/2020    Received Casirivimab/Irdevimab infusion on 09/11/20   Diabetes mellitus without complication (HCC)    HOH (hard of hearing)    Hypertension    Obesity    Renal cancer ([place])    Renal cell cancer ([place])    Throat cancer ([place])    Tremor     Social History   Socioeconomic History   Marital status: Married    Spouse name: Not on file   Number of children: Not on file   Years of education: Not on file   Highest education level: Not on file  Occupational History   Not on file  Tobacco Use   Smoking status: Former    Types: Cigarettes    Quit date: 1991    Years since quitting: 31.9   Smokeless tobacco: Never  Vaping Use   Vaping Use: Never used  Substance and Sexual Activity   Alcohol use: Yes    Comment: Occasional beer drinker   Drug use: No   Sexual activity: Yes  Other Topics Concern   Not on file  Social History Narrative   Lives at home with wife, active and independent at baseline  Social Determinants of Health   Financial Resource Strain: Not on file  Food Insecurity: Not on file  Transportation Needs: Not on file  Physical Activity: Not on file  Stress: Not on file  Social Connections: Not on file  Intimate Partner Violence: Not on file    Past Surgical History:  Procedure Laterality Date   APPENDECTOMY     BLADDER SURGERY     CAROTID PTA/STENT INTERVENTION Right 08/26/2021   Procedure: CAROTID PTA/STENT INTERVENTION;  Surgeon: Katha Cabal, MD;  Location: [facility];  Service: Cardiovascular;  Laterality: Right;   CERVICAL SPINE SURGERY     CHOLECYSTECTOMY N/A 03/23/2018   Procedure: LAPAROSCOPIC CHOLECYSTECTOMY;  Surgeon: Herbert Pun, MD;  Location: [name];  Service: General;  Laterality: N/A;   COLONOSCOPY     COLONOSCOPY WITH PROPOFOL N/A 11/10/2017   Procedure: COLONOSCOPY WITH PROPOFOL;  Surgeon: Toledo, Benay Pike, MD;  Location: [name];  Service: Gastroenterology;  Laterality: N/A;   ESOPHAGOGASTRODUODENOSCOPY  (EGD) WITH PROPOFOL N/A 11/10/2017   Procedure: ESOPHAGOGASTRODUODENOSCOPY (EGD) WITH PROPOFOL;  Surgeon: Toledo, Benay Pike, MD;  Location: [name];  Service: Gastroenterology;  Laterality: N/A;   HERNIA REPAIR     left od surgery     MICROLARYNGOSCOPY N/A 11/05/2020   Procedure: MICROLARYNGOSCOPY WITH BIOPSIES;  Surgeon: Clyde Canterbury, MD;  Location: [facility];  Service: ENT;  Laterality: N/A;   NEPHRECTOMY     right kidney nephrectomy   rt elbow surgery     THROAT SURGERY  1990   for cancer removal at [facility]    Family History  Problem Relation Age of Onset   Diabetes Brother    Hypertension Brother    CAD Brother    CAD Brother     Allergies  Allergen Reactions   Metformin Diarrhea    CBC Latest Ref Rng & Units 08/27/2021 01/14/2021 01/06/2021  WBC 4.0 - 10.5 K/uL 8.1 9.1 8.9  Hemoglobin 13.0 - 17.0 g/dL 12.9(L) 15.3 15.5  Hematocrit 39.0 - 52.0 % 39.9 47.0 47.7  Platelets 150 - 400 K/uL 122(L) 163 160      CMP     Component Value Date/Time   NA 139 08/27/2021 0447   K 3.8 08/27/2021 0447   CL 109 08/27/2021 0447   CO2 25 08/27/2021 0447   GLUCOSE 88 08/27/2021 0447   BUN 23 08/27/2021 0447   CREATININE 1.13 08/27/2021 0447   CALCIUM 9.8 08/27/2021 0447   PROT 7.8 03/25/2018 1218   ALBUMIN 2.6 (L) 03/25/2018 1218   AST 34 03/25/2018 1218   ALT 47 03/25/2018 1218   ALKPHOS 163 (H) 03/25/2018 1218   BILITOT 1.0 03/25/2018 1218   GFRNONAA >60 08/27/2021 0447   GFRAA >60 03/25/2018 1218     No results found.     Assessment & Plan:   1. Carotid stenosis, symptomatic w/o infarct, right Recommend:  Given the patient's asymptomatic subcritical stenosis no further invasive testing or surgery at this time.  Duplex ultrasound shows 1 to 39% stenosis bilaterally.  Patent right ICA stent  Continue antiplatelet therapy as prescribed Continue management of CAD, HTN and Hyperlipidemia Healthy heart diet,  encouraged exercise at least 4  times per week Follow up in 6 months with duplex ultrasound and physical exam    2. Pure hypercholesterolemia Continue statin as ordered and reviewed, no changes at this time   3. Benign essential hypertension Continue antihypertensive medications as already ordered, these medications have been reviewed and there are no changes  at this time.    Current Outpatient Medications on File Prior to Visit  Medication Sig Dispense Refill   amitriptyline (ELAVIL) 25 MG tablet Take 75 mg by mouth at bedtime.      aspirin EC 81 MG tablet Take 81 mg by mouth daily. Swallow whole.     carvedilol (COREG) 6.25 MG tablet Take 6.25 mg by mouth 2 (two) times daily with a meal.     chlorthalidone (HYGROTON) 25 MG tablet Take 25 mg by mouth at bedtime.     Cholecalciferol (DIALYVITE VITAMIN D3 MAX) 1.25 MG (50000 UT) TABS Dialyvite Vitamin D3 Max 1.25 MG (50000 UT) Oral Tablet QTY: 12 tablet Days: 84 Refills: 3  Written: 02/11/21 Patient Instructions: once a week     Cholecalciferol (VITAMIN D3) 1.25 MG (50000 UT) CAPS Take 50,000 Units by mouth every Saturday.     clopidogrel (PLAVIX) 75 MG tablet Take 1 tablet (75 mg total) by mouth daily. 30 tablet 5   empagliflozin (JARDIANCE) 25 MG TABS tablet Take 25 mg by mouth daily.     fluticasone (FLONASE) 50 MCG/ACT nasal spray Place 2 sprays into both nostrils daily as needed for allergies or rhinitis.     glimepiride (AMARYL) 4 MG tablet Take 4 mg by mouth 2 (two) times daily.      insulin NPH-regular Human (NOVOLIN 70/30) (70-30) 100 UNIT/ML injection Inject 45 Units into the skin 2 (two) times daily with a meal.      lisinopril (PRINIVIL,ZESTRIL) 10 MG tablet Take 10 mg by mouth daily.     rosuvastatin (CRESTOR) 40 MG tablet Take 40 mg by mouth daily.     sucralfate (CARAFATE) 1 g tablet Take 1 tablet (1 g total) by mouth 3 (three) times daily. Dissolve in 3-4 tbsp warm water, swish and swallow. 90 tablet 1   tamsulosin (FLOMAX) 0.4 MG CAPS capsule Take 0.4 mg  by mouth at bedtime.     No current facility-administered medications on file prior to visit.    There are no Patient Instructions on file for this visit. No follow-ups on file.   Kris Hartmann, NP

## 2021-12-11 DIAGNOSIS — D509 Iron deficiency anemia, unspecified: Secondary | ICD-10-CM | POA: Diagnosis not present

## 2021-12-11 DIAGNOSIS — N400 Enlarged prostate without lower urinary tract symptoms: Secondary | ICD-10-CM | POA: Diagnosis not present

## 2021-12-11 DIAGNOSIS — E782 Mixed hyperlipidemia: Secondary | ICD-10-CM | POA: Diagnosis not present

## 2021-12-11 DIAGNOSIS — I10 Essential (primary) hypertension: Secondary | ICD-10-CM | POA: Diagnosis not present

## 2021-12-11 DIAGNOSIS — E119 Type 2 diabetes mellitus without complications: Secondary | ICD-10-CM | POA: Diagnosis not present

## 2021-12-15 DIAGNOSIS — E782 Mixed hyperlipidemia: Secondary | ICD-10-CM | POA: Diagnosis not present

## 2021-12-15 DIAGNOSIS — I10 Essential (primary) hypertension: Secondary | ICD-10-CM | POA: Diagnosis not present

## 2021-12-15 DIAGNOSIS — E119 Type 2 diabetes mellitus without complications: Secondary | ICD-10-CM | POA: Diagnosis not present

## 2022-01-08 ENCOUNTER — Ambulatory Visit
Admission: RE | Admit: 2022-01-08 | Discharge: 2022-01-08 | Disposition: A | Discharge status: Home / Self Care | Payer: PPO | Source: Ambulatory Visit | Attending: Radiation Oncology | Admitting: Radiation Oncology

## 2022-01-08 ENCOUNTER — Other Ambulatory Visit: Payer: Self-pay

## 2022-01-08 ENCOUNTER — Encounter: Payer: Self-pay | Admitting: Radiation Oncology

## 2022-01-08 VITALS — BP 163/71 | HR 81 | Temp 97.8°F | Resp 20 | Wt 230.9 lb

## 2022-01-08 DIAGNOSIS — C329 Malignant neoplasm of larynx, unspecified: Secondary | ICD-10-CM

## 2022-01-08 DIAGNOSIS — Z08 Encounter for follow-up examination after completed treatment for malignant neoplasm: Secondary | ICD-10-CM | POA: Diagnosis not present

## 2022-01-08 DIAGNOSIS — Z8521 Personal history of malignant neoplasm of larynx: Secondary | ICD-10-CM | POA: Diagnosis not present

## 2022-01-08 NOTE — Progress Notes (Signed)
Radiation Oncology Follow up Note  Name: [name]   Date:   01/08/2022 MRN:  [id number] DOB: 25-Feb-1946    This 76 y.o. 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  REFERRING PROVIDER: Dion Body, MD  HPI: Patient is a 76 year old male now out 11 months having completed external beam radiation therapy for squamous cell carcinoma the larynx.  Seen today in routine follow-up he is doing well.  He specifically [place] head and neck pain or dysphagia still has some rapid speech and this to his voice.  He has been followed by ENT with periodic upper endoscopy showing no evidence of disease.  He also recently had.  Right carotid artery stent placed back in August.  COMPLICATIONS OF TREATMENT: none  FOLLOW UP COMPLIANCE: keeps appointments   PHYSICAL EXAM:  BP (!) 163/71    Pulse 81    Temp 97.8 F (36.6 C)    Resp 20    Wt 230 lb 14.4 oz (104.7 kg)    SpO2 100%    BMI 37.27 kg/m  Neck is clear without evidence of cervical or supraclavicular adenopathy.  Well-developed well-nourished patient in NAD. HEENT reveals PERLA, EOMI, discs not visualized.  Oral cavity is clear. No oral mucosal lesions are identified. Neck is clear without evidence of cervical or supraclavicular adenopathy. Lungs are clear to A&P. Cardiac examination is essentially unremarkable with regular rate and rhythm without murmur rub or thrill. Abdomen is benign with no organomegaly or masses noted. Motor sensory and DTR levels are equal and symmetric in the upper and lower extremities. Cranial nerves II through XII are grossly intact. Proprioception is intact. No peripheral adenopathy or edema is identified. No motor or sensory levels are noted. Crude visual fields are within normal range.  RADIOLOGY RESULTS: No current films for review  PLAN: Present time patient continues to do well.  He has no evidence of disease  by report from ENT.  He continues close surveillance by ENT.  I have asked to see him back in 6 months for follow-up and then will start once a year visits.  Patient knows to call with any concerns.  I have assured him his raspiness of voice will improve over time.  I would like to take this opportunity to thank you for allowing me to participate in the care of your patient.Noreene Filbert, MD

## 2022-02-24 DIAGNOSIS — Z20822 Contact with and (suspected) exposure to covid-19: Secondary | ICD-10-CM | POA: Diagnosis not present

## 2022-02-24 DIAGNOSIS — J069 Acute upper respiratory infection, unspecified: Secondary | ICD-10-CM | POA: Diagnosis not present

## 2022-03-10 IMAGING — CT CT NECK W/ CM
5 series · 16 of 35 positions shown, 18 images · IV contrast (omnipaque)
Comparison: None.

CLINICAL DATA: Sore throat

EXAM:
CT NECK WITH CONTRAST
TECHNIQUE: Multidetector CT imaging of the neck was performed using the
standard protocol following the bolus administration of intravenous
contrast.
CONTRAST:  75mL OMNIPAQUE IOHEXOL 300 MG/ML  SOLN

[Series 2: axial neck neck (person_name) 2.00 · axial · 0.62mm/px · z∈[-651,-565]mm · 2 of 131 slices shown]
[im 44/131  bone]
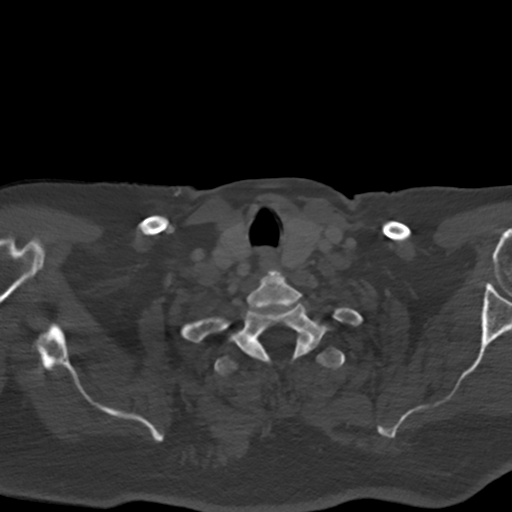
[im 87/131  bone]
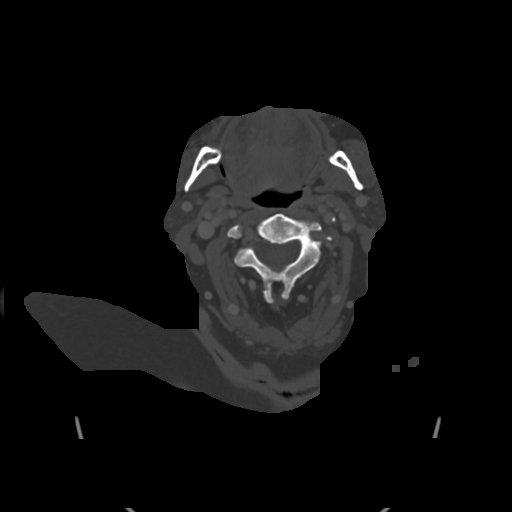

[Series 3: axial bone neck 2.00 · axial · 0.62mm/px · z∈[-651,-565]mm · 2 of 131 slices shown]
[im 44/131  bone]
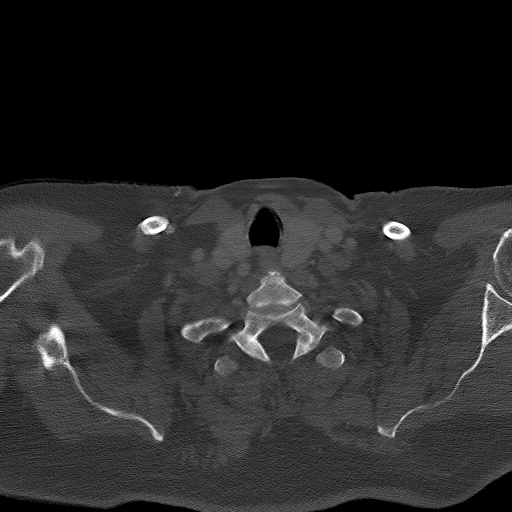
[im 87/131  bone]
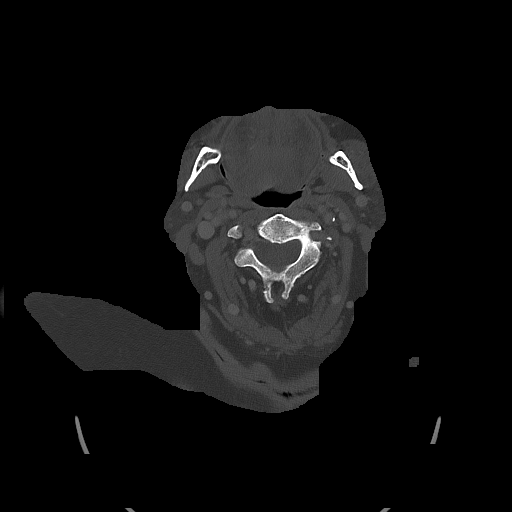

[Series 4: coronal neck neck (person_name) 2.00 cor · coronal · 0.60mm/px · 3 of 150 slices shown]
[im 59/150  bone]
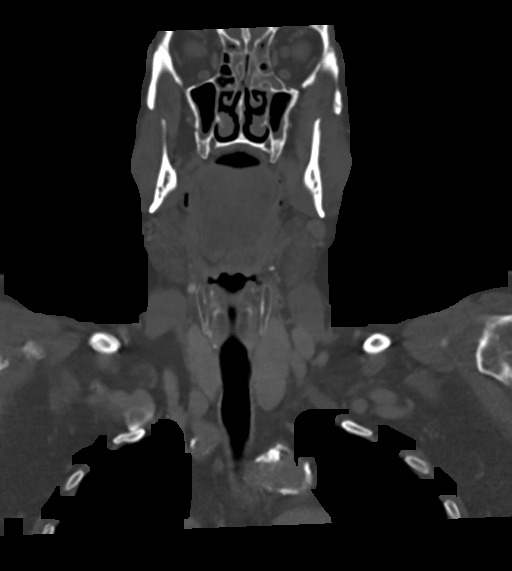
[im 70/150  bone]
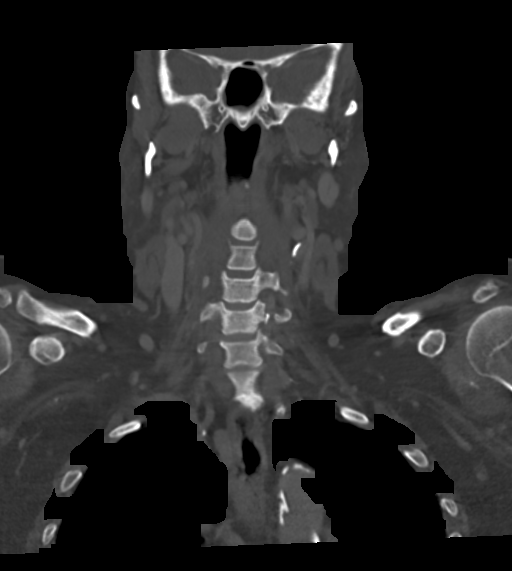
[im 81/150  bone]
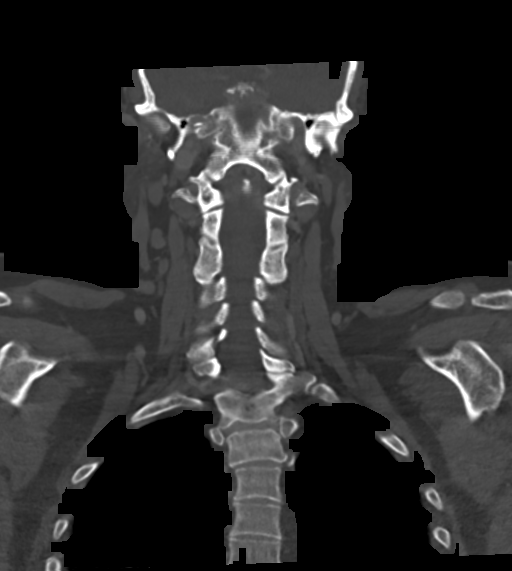

[Series 6: sagittal neck neck (person_name) 2.00 sag · sagittal · 0.59mm/px · 5 of 152 slices shown, 6 images]
[im 51/152  bone]
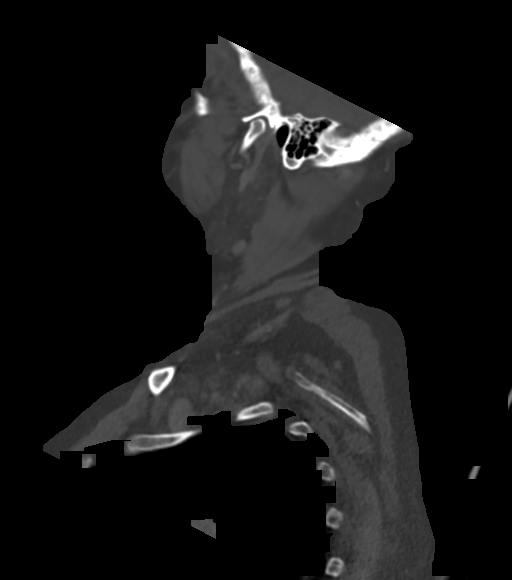
[im 63/152  bone]
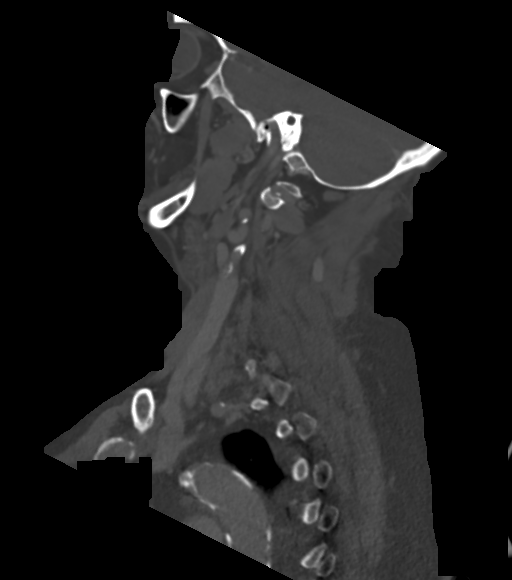
[im 76/152  soft-tissue]
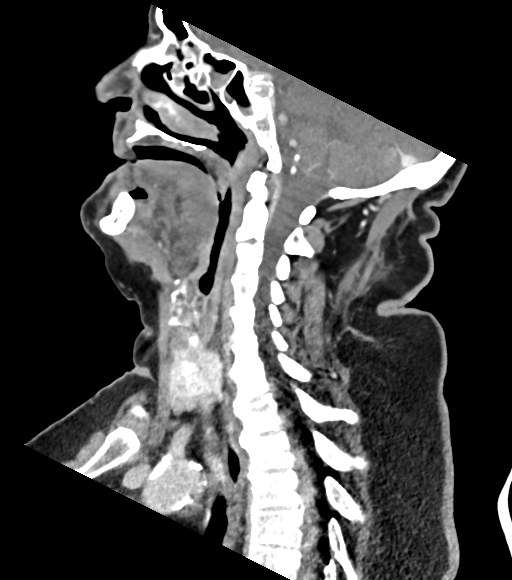
[im 76/152  bone]
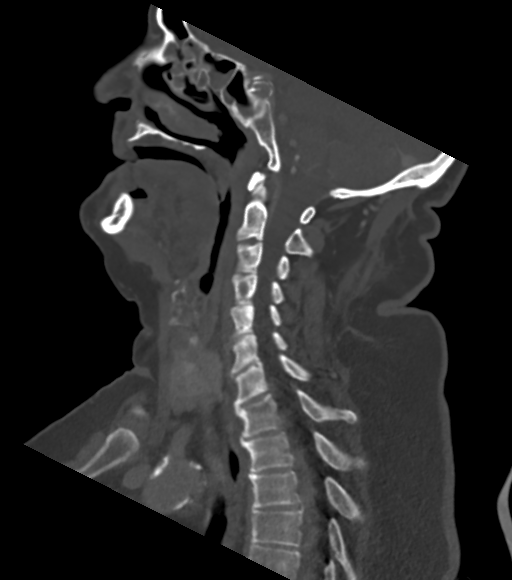
[im 89/152  bone]
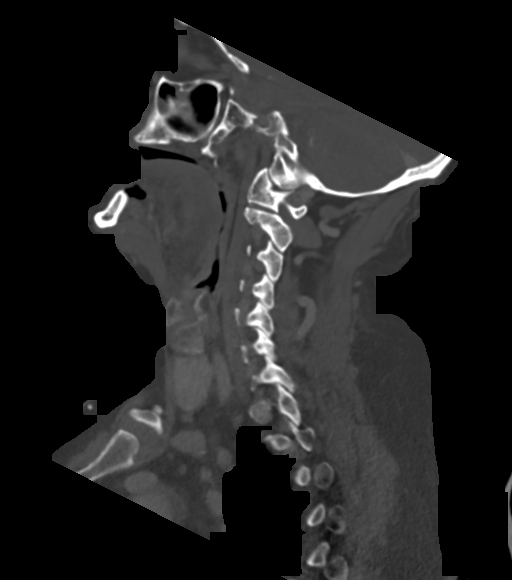
[im 101/152  bone]
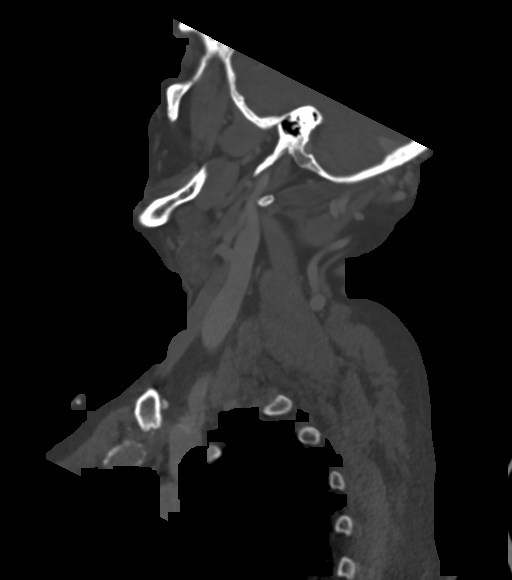

[Series 8: ax oropharynx neck neck (person_name) 2.00 ax · axial · 0.59mm/px · z∈[-751,-569]mm · 4 of 170 slices shown, 5 images]
[im 34/170  soft-tissue]
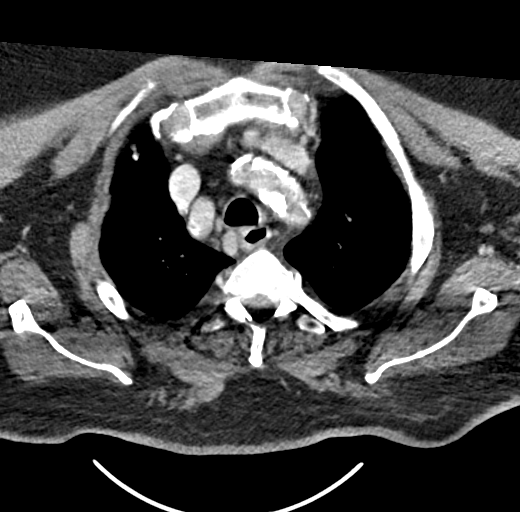
[im 34/170  bone]
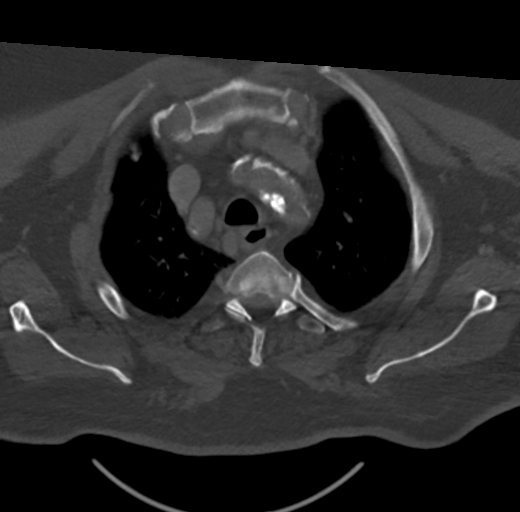
[im 68/170  bone]
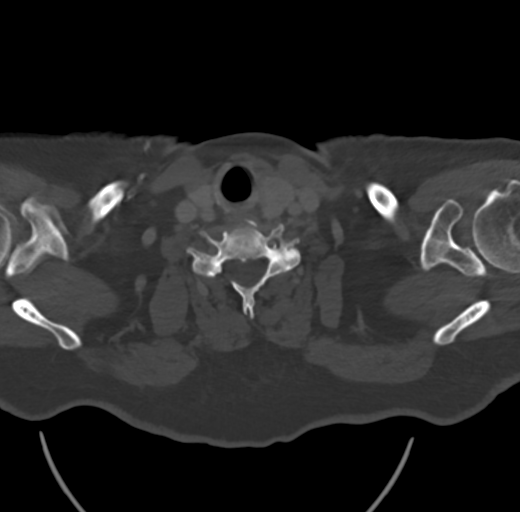
[im 102/170  bone]
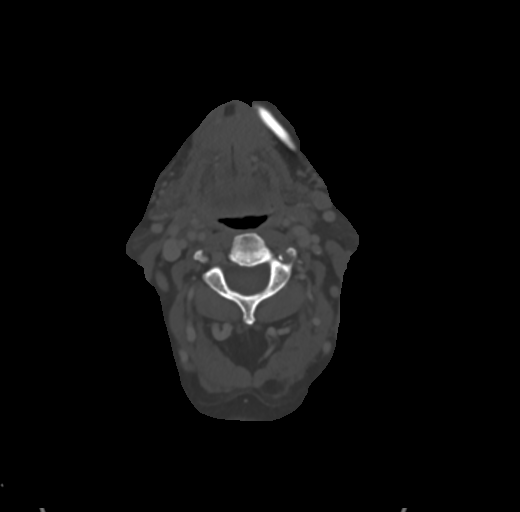
[im 136/170  bone]
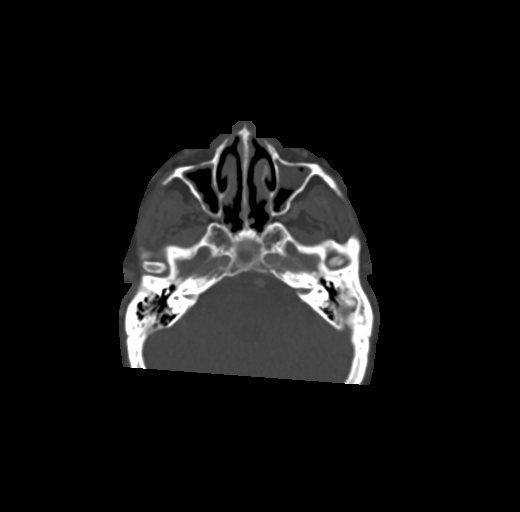

[16 of 35 positions shown; findings below may reference images not displayed]

FINDINGS: PHARYNX AND LARYNX: The nasopharynx, oropharynx and larynx are
normal. Visible portions of the oral cavity, tongue base and floor
of mouth are normal. Normal epiglottis, vallecula and pyriform
sinuses. Contour irregularity at the anterior supraglottic larynx
may be a sequela prior surgery. No retropharyngeal abscess, effusion
or lymphadenopathy.

SALIVARY GLANDS: Normal parotid, submandibular and sublingual
glands.

THYROID: Normal.

LYMPH NODES: No enlarged or abnormal density lymph nodes.

VASCULAR: Major cervical vessels are patent.

LIMITED INTRACRANIAL: Normal.

VISUALIZED ORBITS: Normal.

MASTOIDS AND VISUALIZED PARANASAL SINUSES: No fluid levels or
advanced mucosal thickening. No mastoid effusion.

SKELETON: No bony spinal canal stenosis. No lytic or blastic
lesions.

UPPER CHEST: Clear.

OTHER: None.
IMPRESSION: 1. No acute abnormality of the neck.
2. Contour irregularity at the anterior supraglottic larynx may be a
sequela of prior surgery. No discrete mass is visible within the
larynx, but correlation with endoscopy should be considered.

## 2022-03-16 DIAGNOSIS — G250 Essential tremor: Secondary | ICD-10-CM | POA: Diagnosis not present

## 2022-03-16 DIAGNOSIS — I2510 Atherosclerotic heart disease of native coronary artery without angina pectoris: Secondary | ICD-10-CM | POA: Diagnosis not present

## 2022-03-16 DIAGNOSIS — I10 Essential (primary) hypertension: Secondary | ICD-10-CM | POA: Diagnosis not present

## 2022-03-16 DIAGNOSIS — E119 Type 2 diabetes mellitus without complications: Secondary | ICD-10-CM | POA: Diagnosis not present

## 2022-03-16 DIAGNOSIS — E782 Mixed hyperlipidemia: Secondary | ICD-10-CM | POA: Diagnosis not present

## 2022-03-17 DIAGNOSIS — E782 Mixed hyperlipidemia: Secondary | ICD-10-CM | POA: Diagnosis not present

## 2022-03-17 DIAGNOSIS — I2510 Atherosclerotic heart disease of native coronary artery without angina pectoris: Secondary | ICD-10-CM | POA: Diagnosis not present

## 2022-03-17 DIAGNOSIS — I10 Essential (primary) hypertension: Secondary | ICD-10-CM | POA: Diagnosis not present

## 2022-03-17 DIAGNOSIS — E119 Type 2 diabetes mellitus without complications: Secondary | ICD-10-CM | POA: Diagnosis not present

## 2022-04-06 DIAGNOSIS — E119 Type 2 diabetes mellitus without complications: Secondary | ICD-10-CM | POA: Diagnosis not present

## 2022-04-06 DIAGNOSIS — I10 Essential (primary) hypertension: Secondary | ICD-10-CM | POA: Diagnosis not present

## 2022-04-06 DIAGNOSIS — G250 Essential tremor: Secondary | ICD-10-CM | POA: Diagnosis not present

## 2022-04-06 DIAGNOSIS — E782 Mixed hyperlipidemia: Secondary | ICD-10-CM | POA: Diagnosis not present

## 2022-04-06 DIAGNOSIS — J069 Acute upper respiratory infection, unspecified: Secondary | ICD-10-CM | POA: Diagnosis not present

## 2022-04-21 DIAGNOSIS — J069 Acute upper respiratory infection, unspecified: Secondary | ICD-10-CM | POA: Diagnosis not present

## 2022-04-21 DIAGNOSIS — I10 Essential (primary) hypertension: Secondary | ICD-10-CM | POA: Diagnosis not present

## 2022-04-29 ENCOUNTER — Other Ambulatory Visit (INDEPENDENT_AMBULATORY_CARE_PROVIDER_SITE_OTHER): Payer: Self-pay | Admitting: Nurse Practitioner

## 2022-06-01 ENCOUNTER — Encounter (INDEPENDENT_AMBULATORY_CARE_PROVIDER_SITE_OTHER): Payer: PPO

## 2022-06-01 ENCOUNTER — Ambulatory Visit (INDEPENDENT_AMBULATORY_CARE_PROVIDER_SITE_OTHER): Payer: PPO | Admitting: Nurse Practitioner

## 2022-06-22 ENCOUNTER — Other Ambulatory Visit (INDEPENDENT_AMBULATORY_CARE_PROVIDER_SITE_OTHER): Payer: Self-pay | Admitting: Vascular Surgery

## 2022-06-22 ENCOUNTER — Telehealth (INDEPENDENT_AMBULATORY_CARE_PROVIDER_SITE_OTHER): Payer: Self-pay

## 2022-06-22 DIAGNOSIS — E119 Type 2 diabetes mellitus without complications: Secondary | ICD-10-CM | POA: Diagnosis not present

## 2022-06-22 DIAGNOSIS — L506 Contact urticaria: Secondary | ICD-10-CM | POA: Diagnosis not present

## 2022-06-22 DIAGNOSIS — E782 Mixed hyperlipidemia: Secondary | ICD-10-CM | POA: Diagnosis not present

## 2022-06-22 DIAGNOSIS — I10 Essential (primary) hypertension: Secondary | ICD-10-CM | POA: Diagnosis not present

## 2022-07-01 DIAGNOSIS — E119 Type 2 diabetes mellitus without complications: Secondary | ICD-10-CM | POA: Diagnosis not present

## 2022-07-01 DIAGNOSIS — E782 Mixed hyperlipidemia: Secondary | ICD-10-CM | POA: Diagnosis not present

## 2022-07-01 DIAGNOSIS — E559 Vitamin D deficiency, unspecified: Secondary | ICD-10-CM | POA: Diagnosis not present

## 2022-07-01 DIAGNOSIS — I10 Essential (primary) hypertension: Secondary | ICD-10-CM | POA: Diagnosis not present

## 2022-07-06 DIAGNOSIS — I10 Essential (primary) hypertension: Secondary | ICD-10-CM | POA: Diagnosis not present

## 2022-07-06 DIAGNOSIS — E119 Type 2 diabetes mellitus without complications: Secondary | ICD-10-CM | POA: Diagnosis not present

## 2022-07-06 DIAGNOSIS — E782 Mixed hyperlipidemia: Secondary | ICD-10-CM | POA: Diagnosis not present

## 2022-07-06 DIAGNOSIS — L0212 Furuncle of neck: Secondary | ICD-10-CM | POA: Diagnosis not present

## 2022-07-09 ENCOUNTER — Ambulatory Visit: Payer: PPO | Admitting: Radiation Oncology

## 2022-07-20 DIAGNOSIS — Z6830 Body mass index (BMI) 30.0-30.9, adult: Secondary | ICD-10-CM | POA: Diagnosis not present

## 2022-07-20 DIAGNOSIS — G8929 Other chronic pain: Secondary | ICD-10-CM | POA: Diagnosis not present

## 2022-07-20 DIAGNOSIS — S0101XA Laceration without foreign body of scalp, initial encounter: Secondary | ICD-10-CM | POA: Diagnosis not present

## 2022-07-20 DIAGNOSIS — Z9689 Presence of other specified functional implants: Secondary | ICD-10-CM | POA: Diagnosis not present

## 2022-07-20 DIAGNOSIS — D692 Other nonthrombocytopenic purpura: Secondary | ICD-10-CM | POA: Diagnosis not present

## 2022-07-20 DIAGNOSIS — S3993XA Unspecified injury of pelvis, initial encounter: Secondary | ICD-10-CM | POA: Diagnosis not present

## 2022-07-20 DIAGNOSIS — S0003XA Contusion of scalp, initial encounter: Secondary | ICD-10-CM | POA: Diagnosis not present

## 2022-07-20 DIAGNOSIS — R42 Dizziness and giddiness: Secondary | ICD-10-CM | POA: Diagnosis not present

## 2022-07-20 DIAGNOSIS — S3991XA Unspecified injury of abdomen, initial encounter: Secondary | ICD-10-CM | POA: Diagnosis not present

## 2022-07-20 DIAGNOSIS — R569 Unspecified convulsions: Secondary | ICD-10-CM | POA: Diagnosis not present

## 2022-07-20 DIAGNOSIS — S299XXA Unspecified injury of thorax, initial encounter: Secondary | ICD-10-CM | POA: Diagnosis not present

## 2022-07-20 DIAGNOSIS — Z8551 Personal history of malignant neoplasm of bladder: Secondary | ICD-10-CM | POA: Diagnosis not present

## 2022-07-20 DIAGNOSIS — R58 Hemorrhage, not elsewhere classified: Secondary | ICD-10-CM | POA: Diagnosis not present

## 2022-07-20 DIAGNOSIS — I10 Essential (primary) hypertension: Secondary | ICD-10-CM | POA: Diagnosis not present

## 2022-07-20 DIAGNOSIS — S0990XA Unspecified injury of head, initial encounter: Secondary | ICD-10-CM | POA: Diagnosis not present

## 2022-07-20 DIAGNOSIS — S064X0A Epidural hemorrhage without loss of consciousness, initial encounter: Secondary | ICD-10-CM | POA: Diagnosis not present

## 2022-07-20 DIAGNOSIS — Y33XXXA Other specified events, undetermined intent, initial encounter: Secondary | ICD-10-CM | POA: Diagnosis not present

## 2022-07-20 DIAGNOSIS — Z7984 Long term (current) use of oral hypoglycemic drugs: Secondary | ICD-10-CM | POA: Diagnosis not present

## 2022-07-20 DIAGNOSIS — S199XXA Unspecified injury of neck, initial encounter: Secondary | ICD-10-CM | POA: Diagnosis not present

## 2022-07-20 DIAGNOSIS — S0100XA Unspecified open wound of scalp, initial encounter: Secondary | ICD-10-CM | POA: Diagnosis not present

## 2022-07-20 DIAGNOSIS — I4510 Unspecified right bundle-branch block: Secondary | ICD-10-CM | POA: Diagnosis not present

## 2022-07-20 DIAGNOSIS — Z87891 Personal history of nicotine dependence: Secondary | ICD-10-CM | POA: Diagnosis not present

## 2022-07-20 DIAGNOSIS — W11XXXA Fall on and from ladder, initial encounter: Secondary | ICD-10-CM | POA: Diagnosis not present

## 2022-07-20 DIAGNOSIS — C321 Malignant neoplasm of supraglottis: Secondary | ICD-10-CM | POA: Diagnosis not present

## 2022-07-20 DIAGNOSIS — W108XXA Fall (on) (from) other stairs and steps, initial encounter: Secondary | ICD-10-CM | POA: Diagnosis not present

## 2022-07-20 DIAGNOSIS — S3992XA Unspecified injury of lower back, initial encounter: Secondary | ICD-10-CM | POA: Diagnosis not present

## 2022-07-20 DIAGNOSIS — R000 Tachycardia, unspecified: Secondary | ICD-10-CM | POA: Diagnosis not present

## 2022-07-20 DIAGNOSIS — S0181XA Laceration without foreign body of other part of head, initial encounter: Secondary | ICD-10-CM | POA: Diagnosis not present

## 2022-07-20 DIAGNOSIS — E119 Type 2 diabetes mellitus without complications: Secondary | ICD-10-CM | POA: Diagnosis not present

## 2022-07-20 DIAGNOSIS — E785 Hyperlipidemia, unspecified: Secondary | ICD-10-CM | POA: Diagnosis not present

## 2022-07-20 DIAGNOSIS — E669 Obesity, unspecified: Secondary | ICD-10-CM | POA: Diagnosis not present

## 2022-07-20 DIAGNOSIS — I6522 Occlusion and stenosis of left carotid artery: Secondary | ICD-10-CM | POA: Diagnosis not present

## 2022-07-20 DIAGNOSIS — S065XAA Traumatic subdural hemorrhage with loss of consciousness status unknown, initial encounter: Secondary | ICD-10-CM | POA: Diagnosis not present

## 2022-07-20 DIAGNOSIS — Z7982 Long term (current) use of aspirin: Secondary | ICD-10-CM | POA: Diagnosis not present

## 2022-07-20 DIAGNOSIS — N179 Acute kidney failure, unspecified: Secondary | ICD-10-CM | POA: Diagnosis not present

## 2022-07-20 DIAGNOSIS — E1169 Type 2 diabetes mellitus with other specified complication: Secondary | ICD-10-CM | POA: Diagnosis not present

## 2022-07-20 DIAGNOSIS — Z79899 Other long term (current) drug therapy: Secondary | ICD-10-CM | POA: Diagnosis not present

## 2022-07-20 DIAGNOSIS — H5410 Blindness, one eye, low vision other eye, unspecified eyes: Secondary | ICD-10-CM | POA: Diagnosis not present

## 2022-07-20 DIAGNOSIS — E1142 Type 2 diabetes mellitus with diabetic polyneuropathy: Secondary | ICD-10-CM | POA: Diagnosis not present

## 2022-07-20 DIAGNOSIS — I959 Hypotension, unspecified: Secondary | ICD-10-CM | POA: Diagnosis not present

## 2022-07-20 DIAGNOSIS — S0689AA Other specified intracranial injury with loss of consciousness status unknown, initial encounter: Secondary | ICD-10-CM | POA: Diagnosis not present

## 2022-07-20 DIAGNOSIS — K439 Ventral hernia without obstruction or gangrene: Secondary | ICD-10-CM | POA: Diagnosis not present

## 2022-07-20 DIAGNOSIS — E1151 Type 2 diabetes mellitus with diabetic peripheral angiopathy without gangrene: Secondary | ICD-10-CM | POA: Diagnosis not present

## 2022-07-20 DIAGNOSIS — E7849 Other hyperlipidemia: Secondary | ICD-10-CM | POA: Diagnosis not present

## 2022-07-20 DIAGNOSIS — C329 Malignant neoplasm of larynx, unspecified: Secondary | ICD-10-CM | POA: Diagnosis not present

## 2022-07-20 DIAGNOSIS — W19XXXA Unspecified fall, initial encounter: Secondary | ICD-10-CM | POA: Diagnosis not present

## 2022-07-20 DIAGNOSIS — Z794 Long term (current) use of insulin: Secondary | ICD-10-CM | POA: Diagnosis not present

## 2022-07-20 DIAGNOSIS — Z85528 Personal history of other malignant neoplasm of kidney: Secondary | ICD-10-CM | POA: Diagnosis not present

## 2022-07-20 DIAGNOSIS — R0789 Other chest pain: Secondary | ICD-10-CM | POA: Diagnosis not present

## 2022-07-21 ENCOUNTER — Other Ambulatory Visit (INDEPENDENT_AMBULATORY_CARE_PROVIDER_SITE_OTHER): Payer: Self-pay | Admitting: Nurse Practitioner

## 2022-07-21 DIAGNOSIS — W19XXXA Unspecified fall, initial encounter: Secondary | ICD-10-CM | POA: Diagnosis not present

## 2022-07-21 DIAGNOSIS — W11XXXA Fall on and from ladder, initial encounter: Secondary | ICD-10-CM | POA: Diagnosis not present

## 2022-07-21 DIAGNOSIS — I6523 Occlusion and stenosis of bilateral carotid arteries: Secondary | ICD-10-CM

## 2022-07-21 DIAGNOSIS — S064X0A Epidural hemorrhage without loss of consciousness, initial encounter: Secondary | ICD-10-CM | POA: Diagnosis not present

## 2022-07-21 DIAGNOSIS — S0003XA Contusion of scalp, initial encounter: Secondary | ICD-10-CM | POA: Diagnosis not present

## 2022-07-21 DIAGNOSIS — S0101XA Laceration without foreign body of scalp, initial encounter: Secondary | ICD-10-CM | POA: Diagnosis not present

## 2022-07-21 DIAGNOSIS — S0181XA Laceration without foreign body of other part of head, initial encounter: Secondary | ICD-10-CM | POA: Diagnosis not present

## 2022-07-21 DIAGNOSIS — S065XAA Traumatic subdural hemorrhage with loss of consciousness status unknown, initial encounter: Secondary | ICD-10-CM | POA: Diagnosis not present

## 2022-07-21 DIAGNOSIS — W108XXA Fall (on) (from) other stairs and steps, initial encounter: Secondary | ICD-10-CM | POA: Diagnosis not present

## 2022-07-21 DIAGNOSIS — T1490XA Injury, unspecified, initial encounter: Secondary | ICD-10-CM | POA: Diagnosis not present

## 2022-07-22 ENCOUNTER — Ambulatory Visit (INDEPENDENT_AMBULATORY_CARE_PROVIDER_SITE_OTHER): Payer: PPO | Admitting: Nurse Practitioner

## 2022-07-22 ENCOUNTER — Encounter (INDEPENDENT_AMBULATORY_CARE_PROVIDER_SITE_OTHER): Payer: PPO

## 2022-07-22 DIAGNOSIS — R1312 Dysphagia, oropharyngeal phase: Secondary | ICD-10-CM | POA: Diagnosis not present

## 2022-07-25 DIAGNOSIS — S0634AD Traumatic hemorrhage of right cerebrum with loss of consciousness status unknown, subsequent encounter: Secondary | ICD-10-CM | POA: Diagnosis not present

## 2022-07-25 DIAGNOSIS — W108XXD Fall (on) (from) other stairs and steps, subsequent encounter: Secondary | ICD-10-CM | POA: Diagnosis not present

## 2022-07-26 DIAGNOSIS — S064X0A Epidural hemorrhage without loss of consciousness, initial encounter: Secondary | ICD-10-CM | POA: Diagnosis not present

## 2022-07-26 DIAGNOSIS — T1490XA Injury, unspecified, initial encounter: Secondary | ICD-10-CM | POA: Diagnosis not present

## 2022-07-26 DIAGNOSIS — W108XXA Fall (on) (from) other stairs and steps, initial encounter: Secondary | ICD-10-CM | POA: Diagnosis not present

## 2022-07-26 DIAGNOSIS — S0101XA Laceration without foreign body of scalp, initial encounter: Secondary | ICD-10-CM | POA: Diagnosis not present

## 2022-07-27 DIAGNOSIS — R0789 Other chest pain: Secondary | ICD-10-CM | POA: Diagnosis not present

## 2022-07-27 DIAGNOSIS — E118 Type 2 diabetes mellitus with unspecified complications: Secondary | ICD-10-CM | POA: Diagnosis not present

## 2022-07-27 DIAGNOSIS — E569 Vitamin deficiency, unspecified: Secondary | ICD-10-CM | POA: Diagnosis not present

## 2022-07-27 DIAGNOSIS — R1312 Dysphagia, oropharyngeal phase: Secondary | ICD-10-CM | POA: Diagnosis not present

## 2022-07-27 DIAGNOSIS — S0101XA Laceration without foreign body of scalp, initial encounter: Secondary | ICD-10-CM | POA: Diagnosis not present

## 2022-07-27 DIAGNOSIS — E119 Type 2 diabetes mellitus without complications: Secondary | ICD-10-CM | POA: Diagnosis not present

## 2022-07-27 DIAGNOSIS — E7849 Other hyperlipidemia: Secondary | ICD-10-CM | POA: Diagnosis not present

## 2022-07-27 DIAGNOSIS — T1490XA Injury, unspecified, initial encounter: Secondary | ICD-10-CM | POA: Diagnosis not present

## 2022-07-27 DIAGNOSIS — S064X0D Epidural hemorrhage without loss of consciousness, subsequent encounter: Secondary | ICD-10-CM | POA: Diagnosis not present

## 2022-07-27 DIAGNOSIS — S0100XD Unspecified open wound of scalp, subsequent encounter: Secondary | ICD-10-CM | POA: Diagnosis not present

## 2022-07-27 DIAGNOSIS — R278 Other lack of coordination: Secondary | ICD-10-CM | POA: Diagnosis not present

## 2022-07-27 DIAGNOSIS — R41841 Cognitive communication deficit: Secondary | ICD-10-CM | POA: Diagnosis not present

## 2022-07-27 DIAGNOSIS — W108XXA Fall (on) (from) other stairs and steps, initial encounter: Secondary | ICD-10-CM | POA: Diagnosis not present

## 2022-07-27 DIAGNOSIS — Z8551 Personal history of malignant neoplasm of bladder: Secondary | ICD-10-CM | POA: Diagnosis not present

## 2022-07-27 DIAGNOSIS — S064X0A Epidural hemorrhage without loss of consciousness, initial encounter: Secondary | ICD-10-CM | POA: Diagnosis not present

## 2022-07-27 DIAGNOSIS — M6259 Muscle wasting and atrophy, not elsewhere classified, multiple sites: Secondary | ICD-10-CM | POA: Diagnosis not present

## 2022-07-27 DIAGNOSIS — N179 Acute kidney failure, unspecified: Secondary | ICD-10-CM | POA: Diagnosis not present

## 2022-07-27 DIAGNOSIS — R569 Unspecified convulsions: Secondary | ICD-10-CM | POA: Diagnosis not present

## 2022-07-27 DIAGNOSIS — I10 Essential (primary) hypertension: Secondary | ICD-10-CM | POA: Diagnosis not present

## 2022-07-27 DIAGNOSIS — M6281 Muscle weakness (generalized): Secondary | ICD-10-CM | POA: Diagnosis not present

## 2022-07-27 DIAGNOSIS — N400 Enlarged prostate without lower urinary tract symptoms: Secondary | ICD-10-CM | POA: Diagnosis not present

## 2022-07-27 DIAGNOSIS — F32A Depression, unspecified: Secondary | ICD-10-CM | POA: Diagnosis not present

## 2022-07-27 DIAGNOSIS — S0003XD Contusion of scalp, subsequent encounter: Secondary | ICD-10-CM | POA: Diagnosis not present

## 2022-07-29 DIAGNOSIS — S0003XD Contusion of scalp, subsequent encounter: Secondary | ICD-10-CM | POA: Diagnosis not present

## 2022-07-29 DIAGNOSIS — I10 Essential (primary) hypertension: Secondary | ICD-10-CM | POA: Diagnosis not present

## 2022-07-29 DIAGNOSIS — E118 Type 2 diabetes mellitus with unspecified complications: Secondary | ICD-10-CM | POA: Diagnosis not present

## 2022-07-29 DIAGNOSIS — M6281 Muscle weakness (generalized): Secondary | ICD-10-CM | POA: Diagnosis not present

## 2022-07-30 DIAGNOSIS — N400 Enlarged prostate without lower urinary tract symptoms: Secondary | ICD-10-CM | POA: Diagnosis not present

## 2022-07-30 DIAGNOSIS — M6281 Muscle weakness (generalized): Secondary | ICD-10-CM | POA: Diagnosis not present

## 2022-07-30 DIAGNOSIS — S064X0D Epidural hemorrhage without loss of consciousness, subsequent encounter: Secondary | ICD-10-CM | POA: Diagnosis not present

## 2022-07-30 DIAGNOSIS — E118 Type 2 diabetes mellitus with unspecified complications: Secondary | ICD-10-CM | POA: Diagnosis not present

## 2022-07-31 ENCOUNTER — Ambulatory Visit: Payer: PPO | Admitting: Radiation Oncology

## 2022-08-03 DIAGNOSIS — N179 Acute kidney failure, unspecified: Secondary | ICD-10-CM | POA: Diagnosis not present

## 2022-08-03 DIAGNOSIS — I10 Essential (primary) hypertension: Secondary | ICD-10-CM | POA: Diagnosis not present

## 2022-08-03 DIAGNOSIS — S0003XD Contusion of scalp, subsequent encounter: Secondary | ICD-10-CM | POA: Diagnosis not present

## 2022-08-05 DIAGNOSIS — M6281 Muscle weakness (generalized): Secondary | ICD-10-CM | POA: Diagnosis not present

## 2022-08-05 DIAGNOSIS — E118 Type 2 diabetes mellitus with unspecified complications: Secondary | ICD-10-CM | POA: Diagnosis not present

## 2022-08-05 DIAGNOSIS — S064X0D Epidural hemorrhage without loss of consciousness, subsequent encounter: Secondary | ICD-10-CM | POA: Diagnosis not present

## 2022-08-05 DIAGNOSIS — I10 Essential (primary) hypertension: Secondary | ICD-10-CM | POA: Diagnosis not present

## 2022-08-27 DIAGNOSIS — E782 Mixed hyperlipidemia: Secondary | ICD-10-CM | POA: Diagnosis not present

## 2022-08-27 DIAGNOSIS — E119 Type 2 diabetes mellitus without complications: Secondary | ICD-10-CM | POA: Diagnosis not present

## 2022-08-27 DIAGNOSIS — I10 Essential (primary) hypertension: Secondary | ICD-10-CM | POA: Diagnosis not present

## 2022-09-02 ENCOUNTER — Ambulatory Visit: Payer: PPO | Admitting: Radiation Oncology

## 2022-09-11 ENCOUNTER — Encounter (INDEPENDENT_AMBULATORY_CARE_PROVIDER_SITE_OTHER): Payer: Self-pay | Admitting: Nurse Practitioner

## 2022-09-11 ENCOUNTER — Ambulatory Visit (INDEPENDENT_AMBULATORY_CARE_PROVIDER_SITE_OTHER): Payer: PPO | Admitting: Nurse Practitioner

## 2022-09-11 ENCOUNTER — Ambulatory Visit (INDEPENDENT_AMBULATORY_CARE_PROVIDER_SITE_OTHER): Payer: PPO

## 2022-09-11 VITALS — BP 135/75 | HR 88 | Resp 16 | Wt 224.8 lb

## 2022-09-11 DIAGNOSIS — I6523 Occlusion and stenosis of bilateral carotid arteries: Secondary | ICD-10-CM

## 2022-09-11 DIAGNOSIS — I10 Essential (primary) hypertension: Secondary | ICD-10-CM | POA: Diagnosis not present

## 2022-09-11 DIAGNOSIS — E7800 Pure hypercholesterolemia, unspecified: Secondary | ICD-10-CM

## 2022-09-20 ENCOUNTER — Encounter (INDEPENDENT_AMBULATORY_CARE_PROVIDER_SITE_OTHER): Payer: Self-pay | Admitting: Nurse Practitioner

## 2022-09-20 NOTE — Progress Notes (Signed)
Subjective:    Patient ID: [name], male    DOB: [birth year], 76 y.o.   MRN: [id number] Chief Complaint  Patient presents with   Follow-up    Ultrasound follow up    [name] is a 76 year old male that is seen for follow up evaluation of carotid stenosis. The carotid stenosis followed by ultrasound.  The patient had a right ICA stent placed on 08/26/2021   The patient denies amaurosis fugax. There is no recent history of TIA symptoms or focal motor deficits. There is no prior documented CVA.   The patient is taking enteric-coated aspirin 81 mg daily.   There is no history of migraine headaches. There is no history of seizures.   The patient has a history of coronary artery disease, no recent episodes of angina or shortness of breath. The patient denies PAD or claudication symptoms. There is a history of hyperlipidemia which is being treated with a statin.     Carotid Duplex done today shows 1 to 39% stenosis of the right ICA.  40 to 59% stenosis of the left ICA.  Nonhemodynamically significant plaque noted in the common carotid artery bilaterally.  Bilateral vertebral arteries demonstrate antegrade flow.  Normal flow hemodynamics in the bilateral subclavian arteries.    Review of Systems  All other systems reviewed and are negative.      Objective:   Physical Exam Vitals reviewed.  HENT:     Head: Normocephalic.  Neck:     Vascular: No carotid bruit.  Cardiovascular:     Rate and Rhythm: Normal rate.     Pulses: Normal pulses.  Pulmonary:     Effort: Pulmonary effort is normal.  Skin:    General: Skin is warm and dry.  Neurological:     Mental Status: He is alert and oriented to person, place, and time.  Psychiatric:        Mood and Affect: Mood normal.        Behavior: Behavior normal.        Thought Content: Thought content normal.        Judgment: Judgment normal.     BP 135/75 (BP Location: Right Arm)   Pulse 88   Resp 16   Wt 224 lb 12.8 oz  (102 kg)   BMI 36.28 kg/m   Past Medical History:  Diagnosis Date   Back pain    Bladder cancer ([place])    COVID-19 09/10/2020   Received Casirivimab/Irdevimab infusion on 09/11/20   Diabetes mellitus without complication (HCC)    HOH (hard of hearing)    Hypertension    Obesity    Renal cancer ([place])    Renal cell cancer ([place])    Throat cancer ([place])    Tremor     Social History   Socioeconomic History   Marital status: Married    Spouse name: Not on file   Number of children: Not on file   Years of education: Not on file   Highest education level: Not on file  Occupational History   Not on file  Tobacco Use   Smoking status: Former    Types: Cigarettes    Quit date: 1991    Years since quitting: 32.7   Smokeless tobacco: Never  Vaping Use   Vaping Use: Never used  Substance and Sexual Activity   Alcohol use: Yes    Comment: Occasional beer drinker   Drug use: No   Sexual activity: Yes  Other Topics Concern  Not on file  Social History Narrative   Lives at home with wife, active and independent at baseline   Social Determinants of Health   Financial Resource Strain: Not on file  Food Insecurity: Not on file  Transportation Needs: Not on file  Physical Activity: Not on file  Stress: Not on file  Social Connections: Not on file  Intimate Partner Violence: Not on file    Past Surgical History:  Procedure Laterality Date   APPENDECTOMY     BLADDER SURGERY     CAROTID PTA/STENT INTERVENTION Right 08/26/2021   Procedure: CAROTID PTA/STENT INTERVENTION;  Surgeon: Delana Meyer, Dolores Lory, MD;  Location: [facility];  Service: Cardiovascular;  Laterality: Right;   CERVICAL SPINE SURGERY     CHOLECYSTECTOMY N/A 03/23/2018   Procedure: LAPAROSCOPIC CHOLECYSTECTOMY;  Surgeon: Herbert Pun, MD;  Location: [name];  Service: General;  Laterality: N/A;   COLONOSCOPY     COLONOSCOPY WITH PROPOFOL N/A 11/10/2017   Procedure: COLONOSCOPY WITH PROPOFOL;   Surgeon: Toledo, Benay Pike, MD;  Location: [name];  Service: Gastroenterology;  Laterality: N/A;   ESOPHAGOGASTRODUODENOSCOPY (EGD) WITH PROPOFOL N/A 11/10/2017   Procedure: ESOPHAGOGASTRODUODENOSCOPY (EGD) WITH PROPOFOL;  Surgeon: Toledo, Benay Pike, MD;  Location: [name];  Service: Gastroenterology;  Laterality: N/A;   HERNIA REPAIR     left od surgery     MICROLARYNGOSCOPY N/A 11/05/2020   Procedure: MICROLARYNGOSCOPY WITH BIOPSIES;  Surgeon: Clyde Canterbury, MD;  Location: [place];  Service: ENT;  Laterality: N/A;   NEPHRECTOMY     right kidney nephrectomy   rt elbow surgery     [place]   for cancer removal at [facility]    Family History  Problem Relation Age of Onset   Diabetes Brother    Hypertension Brother    CAD Brother    CAD Brother     Allergies  Allergen Reactions   Metformin Diarrhea       Latest Ref Rng & Units 08/27/2021    4:47 AM 01/14/2021    1:45 PM 01/06/2021    9:00 AM  CBC  WBC 4.0 - 10.5 K/uL 8.1  9.1  8.9   Hemoglobin 13.0 - 17.0 g/dL 12.9  15.3  15.5   Hematocrit 39.0 - 52.0 % 39.9  47.0  47.7   Platelets 150 - 400 K/uL 122  163  160       CMP     Component Value Date/Time   NA 139 08/27/2021 0447   K 3.8 08/27/2021 0447   CL 109 08/27/2021 0447   CO2 25 08/27/2021 0447   GLUCOSE 88 08/27/2021 0447   BUN 23 08/27/2021 0447   CREATININE 1.13 08/27/2021 0447   CALCIUM 9.8 08/27/2021 0447   PROT 7.8 03/25/2018 1218   ALBUMIN 2.6 (L) 03/25/2018 1218   AST 34 03/25/2018 1218   ALT 47 03/25/2018 1218   ALKPHOS 163 (H) 03/25/2018 1218   BILITOT 1.0 03/25/2018 1218   GFRNONAA >60 08/27/2021 0447   GFRAA >60 03/25/2018 1218     No results found.     Assessment & Plan:   1. Bilateral carotid artery stenosis Recommend:  Given the patient's asymptomatic subcritical stenosis no further invasive testing or surgery at this time.  Duplex ultrasound shows <50% stenosis bilaterally.  Continue  antiplatelet therapy as prescribed Continue management of CAD, HTN and Hyperlipidemia Healthy heart diet,  encouraged exercise at least 4 times per week Follow up in 12 months with duplex  ultrasound and physical exam    2. Pure hypercholesterolemia Continue statin as ordered and reviewed, no changes at this time   3. Benign essential hypertension Continue antihypertensive medications as already ordered, these medications have been reviewed and there are no changes at this time.    Current Outpatient Medications on File Prior to Visit  Medication Sig Dispense Refill   amitriptyline (ELAVIL) 25 MG tablet Take 75 mg by mouth at bedtime.      aspirin EC 81 MG tablet Take 81 mg by mouth daily. Swallow whole.     carvedilol (COREG) 6.25 MG tablet Take 6.25 mg by mouth 2 (two) times daily with a meal.     chlorthalidone (HYGROTON) 25 MG tablet Take 25 mg by mouth at bedtime.     Cholecalciferol (DIALYVITE VITAMIN D3 MAX) 1.25 MG (50000 UT) TABS Dialyvite Vitamin D3 Max 1.25 MG (50000 UT) Oral Tablet QTY: 12 tablet Days: 84 Refills: 3  Written: 02/11/21 Patient Instructions: once a week     Cholecalciferol (VITAMIN D3) 1.25 MG (50000 UT) CAPS Take 50,000 Units by mouth every Saturday.     clopidogrel (PLAVIX) 75 MG tablet Take 1 tablet by mouth once daily 90 tablet 0   empagliflozin (JARDIANCE) 25 MG TABS tablet Take 25 mg by mouth daily.     fluticasone (FLONASE) 50 MCG/ACT nasal spray Place 2 sprays into both nostrils daily as needed for allergies or rhinitis.     glimepiride (AMARYL) 4 MG tablet Take 4 mg by mouth 2 (two) times daily.      insulin NPH-regular Human (NOVOLIN 70/30) (70-30) 100 UNIT/ML injection Inject 45 Units into the skin 2 (two) times daily with a meal.      lisinopril (PRINIVIL,ZESTRIL) 10 MG tablet Take 10 mg by mouth daily.     rosuvastatin (CRESTOR) 40 MG tablet Take 40 mg by mouth daily.     sucralfate (CARAFATE) 1 g tablet Take 1 tablet (1 g total) by mouth 3 (three)  times daily. Dissolve in 3-4 tbsp warm water, swish and swallow. 90 tablet 1   tamsulosin (FLOMAX) 0.4 MG CAPS capsule Take 0.4 mg by mouth at bedtime.     No current facility-administered medications on file prior to visit.    There are no Patient Instructions on file for this visit. No follow-ups on file.   Kris Hartmann, NP

## 2022-10-01 DIAGNOSIS — E782 Mixed hyperlipidemia: Secondary | ICD-10-CM | POA: Diagnosis not present

## 2022-10-01 DIAGNOSIS — E119 Type 2 diabetes mellitus without complications: Secondary | ICD-10-CM | POA: Diagnosis not present

## 2022-10-01 DIAGNOSIS — E669 Obesity, unspecified: Secondary | ICD-10-CM | POA: Diagnosis not present

## 2022-10-01 DIAGNOSIS — I10 Essential (primary) hypertension: Secondary | ICD-10-CM | POA: Diagnosis not present

## 2022-10-06 DIAGNOSIS — Z23 Encounter for immunization: Secondary | ICD-10-CM | POA: Diagnosis not present

## 2022-10-06 DIAGNOSIS — E782 Mixed hyperlipidemia: Secondary | ICD-10-CM | POA: Diagnosis not present

## 2022-10-06 DIAGNOSIS — E119 Type 2 diabetes mellitus without complications: Secondary | ICD-10-CM | POA: Diagnosis not present

## 2022-10-06 DIAGNOSIS — I10 Essential (primary) hypertension: Secondary | ICD-10-CM | POA: Diagnosis not present

## 2022-10-06 DIAGNOSIS — Z0000 Encounter for general adult medical examination without abnormal findings: Secondary | ICD-10-CM | POA: Diagnosis not present

## 2022-10-29 DIAGNOSIS — I10 Essential (primary) hypertension: Secondary | ICD-10-CM | POA: Diagnosis not present

## 2022-10-29 DIAGNOSIS — I952 Hypotension due to drugs: Secondary | ICD-10-CM | POA: Diagnosis not present

## 2022-10-29 DIAGNOSIS — E782 Mixed hyperlipidemia: Secondary | ICD-10-CM | POA: Diagnosis not present

## 2022-10-29 DIAGNOSIS — E119 Type 2 diabetes mellitus without complications: Secondary | ICD-10-CM | POA: Diagnosis not present

## 2022-11-03 DIAGNOSIS — I119 Hypertensive heart disease without heart failure: Secondary | ICD-10-CM | POA: Diagnosis not present

## 2022-11-03 DIAGNOSIS — I2510 Atherosclerotic heart disease of native coronary artery without angina pectoris: Secondary | ICD-10-CM | POA: Diagnosis not present

## 2022-11-03 DIAGNOSIS — E782 Mixed hyperlipidemia: Secondary | ICD-10-CM | POA: Diagnosis not present

## 2022-11-03 DIAGNOSIS — R0602 Shortness of breath: Secondary | ICD-10-CM | POA: Diagnosis not present

## 2022-11-03 DIAGNOSIS — I10 Essential (primary) hypertension: Secondary | ICD-10-CM | POA: Diagnosis not present

## 2022-11-08 ENCOUNTER — Other Ambulatory Visit (INDEPENDENT_AMBULATORY_CARE_PROVIDER_SITE_OTHER): Payer: Self-pay | Admitting: Vascular Surgery

## 2022-12-04 DIAGNOSIS — H2511 Age-related nuclear cataract, right eye: Secondary | ICD-10-CM | POA: Diagnosis not present

## 2022-12-07 DIAGNOSIS — I10 Essential (primary) hypertension: Secondary | ICD-10-CM | POA: Diagnosis not present

## 2022-12-07 DIAGNOSIS — E119 Type 2 diabetes mellitus without complications: Secondary | ICD-10-CM | POA: Diagnosis not present

## 2022-12-07 DIAGNOSIS — E782 Mixed hyperlipidemia: Secondary | ICD-10-CM | POA: Diagnosis not present

## 2022-12-15 DIAGNOSIS — H2511 Age-related nuclear cataract, right eye: Secondary | ICD-10-CM | POA: Diagnosis not present

## 2023-01-12 ENCOUNTER — Encounter: Payer: Self-pay | Admitting: Ophthalmology

## 2023-01-18 DIAGNOSIS — E782 Mixed hyperlipidemia: Secondary | ICD-10-CM | POA: Diagnosis not present

## 2023-01-18 DIAGNOSIS — I10 Essential (primary) hypertension: Secondary | ICD-10-CM | POA: Diagnosis not present

## 2023-01-18 DIAGNOSIS — E119 Type 2 diabetes mellitus without complications: Secondary | ICD-10-CM | POA: Diagnosis not present

## 2023-01-18 DIAGNOSIS — N400 Enlarged prostate without lower urinary tract symptoms: Secondary | ICD-10-CM | POA: Diagnosis not present

## 2023-01-21 ENCOUNTER — Encounter
Admission: RE | Disposition: A | Discharge status: Home / Self Care | Payer: Self-pay | Source: Ambulatory Visit | Attending: Ophthalmology

## 2023-01-21 ENCOUNTER — Ambulatory Visit: Payer: PPO | Admitting: Anesthesiology

## 2023-01-21 ENCOUNTER — Encounter: Payer: Self-pay | Admitting: Ophthalmology

## 2023-01-21 ENCOUNTER — Ambulatory Visit
Admission: RE | Admit: 2023-01-21 | Discharge: 2023-01-21 | Disposition: A | Discharge status: Home / Self Care | Payer: PPO | Source: Ambulatory Visit | Attending: Ophthalmology | Admitting: Ophthalmology

## 2023-01-21 ENCOUNTER — Other Ambulatory Visit: Payer: Self-pay

## 2023-01-21 DIAGNOSIS — Z87891 Personal history of nicotine dependence: Secondary | ICD-10-CM | POA: Diagnosis not present

## 2023-01-21 DIAGNOSIS — Z7984 Long term (current) use of oral hypoglycemic drugs: Secondary | ICD-10-CM | POA: Diagnosis not present

## 2023-01-21 DIAGNOSIS — I10 Essential (primary) hypertension: Secondary | ICD-10-CM | POA: Diagnosis not present

## 2023-01-21 DIAGNOSIS — E1136 Type 2 diabetes mellitus with diabetic cataract: Secondary | ICD-10-CM | POA: Diagnosis not present

## 2023-01-21 DIAGNOSIS — T7840XA Allergy, unspecified, initial encounter: Secondary | ICD-10-CM | POA: Diagnosis not present

## 2023-01-21 DIAGNOSIS — Z85528 Personal history of other malignant neoplasm of kidney: Secondary | ICD-10-CM | POA: Diagnosis not present

## 2023-01-21 DIAGNOSIS — Z794 Long term (current) use of insulin: Secondary | ICD-10-CM | POA: Diagnosis not present

## 2023-01-21 DIAGNOSIS — Z8616 Personal history of COVID-19: Secondary | ICD-10-CM | POA: Diagnosis not present

## 2023-01-21 DIAGNOSIS — Z8551 Personal history of malignant neoplasm of bladder: Secondary | ICD-10-CM | POA: Insufficient documentation

## 2023-01-21 DIAGNOSIS — H2511 Age-related nuclear cataract, right eye: Secondary | ICD-10-CM | POA: Insufficient documentation

## 2023-01-21 DIAGNOSIS — Z08 Encounter for follow-up examination after completed treatment for malignant neoplasm: Secondary | ICD-10-CM | POA: Insufficient documentation

## 2023-01-21 DIAGNOSIS — Z09 Encounter for follow-up examination after completed treatment for conditions other than malignant neoplasm: Secondary | ICD-10-CM | POA: Diagnosis not present

## 2023-01-21 HISTORY — PX: CATARACT EXTRACTION W/PHACO: SHX586

## 2023-01-21 LAB — GLUCOSE, CAPILLARY: Glucose-Capillary: 148 mg/dL — ABNORMAL HIGH (ref 70–99)

## 2023-01-21 SURGERY — PHACOEMULSIFICATION, CATARACT, WITH IOL INSERTION
Anesthesia: Monitor Anesthesia Care | Site: Eye | Laterality: Right

## 2023-01-21 MED ORDER — ONDANSETRON HCL 4 MG/2ML IJ SOLN: 4.0000 mg | Freq: Once | INTRAMUSCULAR | Status: DC | PRN

## 2023-01-21 MED ORDER — MOXIFLOXACIN HCL 0.5 % OP SOLN
OPHTHALMIC | Status: DC | PRN
  Administered 2023-01-21: .2 mL via OPHTHALMIC

## 2023-01-21 MED ORDER — TETRACAINE HCL 0.5 % OP SOLN
1.0000 [drp] | OPHTHALMIC | Status: DC | PRN
  Administered 2023-01-21 (×3): 1 [drp] via OPHTHALMIC

## 2023-01-21 MED ORDER — SIGHTPATH DOSE#1 BSS IO SOLN
INTRAOCULAR | Status: DC | PRN
  Administered 2023-01-21: 15 mL

## 2023-01-21 MED ORDER — LIDOCAINE HCL (PF) 2 % IJ SOLN
INTRAOCULAR | Status: DC | PRN
  Administered 2023-01-21: 1 mL via INTRAOCULAR

## 2023-01-21 MED ORDER — BRIMONIDINE TARTRATE-TIMOLOL 0.2-0.5 % OP SOLN
OPHTHALMIC | Status: DC | PRN
  Administered 2023-01-21: 1 [drp] via OPHTHALMIC

## 2023-01-21 MED ORDER — MIDAZOLAM HCL 2 MG/2ML IJ SOLN
INTRAMUSCULAR | Status: DC | PRN
  Administered 2023-01-21 (×2): 1 mg via INTRAVENOUS

## 2023-01-21 MED ORDER — SIGHTPATH DOSE#1 NA HYALUR & NA CHOND-NA HYALUR IO KIT
PACK | INTRAOCULAR | Status: DC | PRN
  Administered 2023-01-21: 1 via OPHTHALMIC

## 2023-01-21 MED ORDER — ARMC OPHTHALMIC DILATING DROPS
1.0000 | OPHTHALMIC | Status: DC | PRN
  Administered 2023-01-21 (×3): 1 via OPHTHALMIC

## 2023-01-21 MED ORDER — SIGHTPATH DOSE#1 BSS IO SOLN
INTRAOCULAR | Status: DC | PRN
  Administered 2023-01-21: 77 mL via OPHTHALMIC

## 2023-01-21 MED ORDER — LACTATED RINGERS IV SOLN: INTRAVENOUS | Status: DC

## 2023-01-21 MED ORDER — FENTANYL CITRATE (PF) 100 MCG/2ML IJ SOLN
INTRAMUSCULAR | Status: DC | PRN
  Administered 2023-01-21 (×2): 50 ug via INTRAVENOUS

## 2023-01-21 SURGICAL SUPPLY — 12 items
CATARACT SUITE SIGHTPATH (MISCELLANEOUS) ×1 IMPLANT
DISSECTOR HYDRO NUCLEUS 50X22 (MISCELLANEOUS) ×1 IMPLANT
DRSG TEGADERM 2-3/8X2-3/4 SM (GAUZE/BANDAGES/DRESSINGS) ×1 IMPLANT
FEE CATARACT SUITE SIGHTPATH (MISCELLANEOUS) ×1 IMPLANT
GLOVE SURG SYN 7.5  E (GLOVE) ×1
GLOVE SURG SYN 7.5 E (GLOVE) ×1 IMPLANT
GLOVE SURG SYN 7.5 PF PI (GLOVE) ×1 IMPLANT
GLOVE SURG SYN 8.5  E (GLOVE) ×1
GLOVE SURG SYN 8.5 E (GLOVE) ×1 IMPLANT
GLOVE SURG SYN 8.5 PF PI (GLOVE) ×1 IMPLANT
LENS IOL TECNIS EYHANCE 21.5 (Intraocular Lens) IMPLANT
WATER STERILE IRR 250ML POUR (IV SOLUTION) ×1 IMPLANT

## 2023-01-21 NOTE — Transfer of Care (Signed)
Immediate Anesthesia Transfer of Care Note  Patient: [name]  Procedure(s) Performed: CATARACT EXTRACTION PHACO AND INTRAOCULAR LENS PLACEMENT (IOC) RIGHT DIABETIC  4.89  00:45.6 (Right: Eye)  Patient Location: PACU  Anesthesia Type: MAC  Level of Consciousness: awake, alert  and patient cooperative  Airway and Oxygen Therapy: Patient Spontanous Breathing and Patient connected to supplemental oxygen  Post-op Assessment: Post-op Vital signs reviewed, Patient's Cardiovascular Status Stable, Respiratory Function Stable, Patent Airway and No signs of Nausea or vomiting  Post-op Vital Signs: Reviewed and stable  Complications: No notable events documented.

## 2023-01-21 NOTE — H&P (Signed)
[place]   Primary Care Physician:  Perrin Maltese, MD Ophthalmologist: Dr. Merleen Nicely  Pre-Procedure History & Physical: HPI:  [name] is a 77 y.o. male here for cataract surgery.   Past Medical History:  Diagnosis Date   Back pain    Bladder cancer ([place])    COVID-19 09/10/2020   Received Casirivimab/Irdevimab infusion on 09/11/20   Diabetes mellitus without complication (HCC)    HOH (hard of hearing)    Hypertension    Obesity    Renal cancer ([place])    Renal cell cancer ([place])    Throat cancer ([place])    Tremor     Past Surgical History:  Procedure Laterality Date   APPENDECTOMY     BLADDER SURGERY     CAROTID PTA/STENT INTERVENTION Right 08/26/2021   Procedure: CAROTID PTA/STENT INTERVENTION;  Surgeon: Katha Cabal, MD;  Location: [place] [facility];  Service: Cardiovascular;  Laterality: Right;   CERVICAL SPINE SURGERY     CHOLECYSTECTOMY N/A 03/23/2018   Procedure: LAPAROSCOPIC CHOLECYSTECTOMY;  Surgeon: Herbert Pun, MD;  Location: [name];  Service: General;  Laterality: N/A;   COLONOSCOPY     COLONOSCOPY WITH PROPOFOL N/A 11/10/2017   Procedure: COLONOSCOPY WITH PROPOFOL;  Surgeon: Toledo, Benay Pike, MD;  Location: [name];  Service: Gastroenterology;  Laterality: N/A;   ESOPHAGOGASTRODUODENOSCOPY (EGD) WITH PROPOFOL N/A 11/10/2017   Procedure: ESOPHAGOGASTRODUODENOSCOPY (EGD) WITH PROPOFOL;  Surgeon: Toledo, Benay Pike, MD;  Location: [name];  Service: Gastroenterology;  Laterality: N/A;   HERNIA REPAIR     left od surgery     MICROLARYNGOSCOPY N/A 11/05/2020   Procedure: MICROLARYNGOSCOPY WITH BIOPSIES;  Surgeon: Clyde Canterbury, MD;  Location: [place];  Service: ENT;  Laterality: N/A;   NEPHRECTOMY     right kidney nephrectomy   rt elbow surgery     THROAT SURGERY  1990   for cancer removal at [facility]    Prior to Admission medications   Medication Sig Start Date End Date Taking? Authorizing  Provider  amitriptyline (ELAVIL) 25 MG tablet Take 75 mg by mouth at bedtime.    Yes [provider]  aspirin EC 81 MG tablet Take 81 mg by mouth daily. Swallow whole.   Yes [provider]  carvedilol (COREG) 6.25 MG tablet Take 6.25 mg by mouth 2 (two) times daily with a meal.   Yes [provider]  chlorthalidone (HYGROTON) 25 MG tablet Take 25 mg by mouth at bedtime. 07/27/21  Yes [provider]  Cholecalciferol (VITAMIN D3) 1.25 MG (50000 UT) CAPS Take 50,000 Units by mouth every Saturday. 02/11/21  Yes [provider]  clopidogrel (PLAVIX) 75 MG tablet Take 1 tablet by mouth once daily 11/09/22  Yes Kris Hartmann, NP  empagliflozin (JARDIANCE) 25 MG TABS tablet Take 25 mg by mouth daily.   Yes [provider]  fluticasone (FLONASE) 50 MCG/ACT nasal spray Place 2 sprays into both nostrils daily as needed for allergies or rhinitis.   Yes [provider]  glimepiride (AMARYL) 4 MG tablet Take 4 mg by mouth 2 (two) times daily.    Yes [provider]  insulin NPH-regular Human (NOVOLIN 70/30) (70-30) 100 UNIT/ML injection Inject 45 Units into the skin 2 (two) times daily with a meal.    Yes [provider]  lisinopril (PRINIVIL,ZESTRIL) 10 MG tablet Take 10 mg by mouth daily.   Yes [provider]  rosuvastatin (CRESTOR) 40 MG tablet Take 40 mg  by mouth daily. 02/11/21  Yes [provider]  tamsulosin (FLOMAX) 0.4 MG CAPS capsule Take 0.4 mg by mouth at bedtime.   Yes [provider]    Allergies as of 12/08/2022 - Review Complete 09/20/2022  Allergen Reaction Noted   Metformin Diarrhea 12/01/2019    Family History  Problem Relation Age of Onset   Diabetes Brother    Hypertension Brother    CAD Brother    CAD Brother     Social History   Socioeconomic History   Marital status: Married    Spouse name: Not on file   Number of children: Not on file   Years of education: Not on  file   Highest education level: Not on file  Occupational History   Not on file  Tobacco Use   Smoking status: Former    Types: Cigarettes    Quit date: 1991    Years since quitting: 33.0   Smokeless tobacco: Never  Vaping Use   Vaping Use: Never used  Substance and Sexual Activity   Alcohol use: Not Currently    Comment: Occasional beer drinker   Drug use: No   Sexual activity: Yes  Other Topics Concern   Not on file  Social History Narrative   Lives at home with wife, active and independent at baseline   Social Determinants of Health   Financial Resource Strain: Not on file  Food Insecurity: Not on file  Transportation Needs: Not on file  Physical Activity: Not on file  Stress: Not on file  Social Connections: Not on file  Intimate Partner Violence: Not on file    Review of Systems: See HPI, otherwise negative ROS  Physical Exam: BP (!) 163/66   Pulse 72   Temp 98.1 F (36.7 C)   Ht 6' (1.829 m)   Wt 98 kg   SpO2 96%   BMI 29.29 kg/m  General:   Alert, cooperative in NAD Head:  Normocephalic and atraumatic. Respiratory:  Normal work of breathing. Cardiovascular:  RRR  Impression/Plan: [name] is here for cataract surgery.  Risks, benefits, limitations, and alternatives regarding cataract surgery have been reviewed with the patient.  Questions have been answered.  All parties agreeable.   Norvel Richards, MD  01/21/2023, 11:26 AM

## 2023-01-21 NOTE — Anesthesia Postprocedure Evaluation (Signed)
Anesthesia Post Note  Patient: [name]  Procedure(s) Performed: CATARACT EXTRACTION PHACO AND INTRAOCULAR LENS PLACEMENT (IOC) RIGHT DIABETIC  4.89  00:45.6 (Right: Eye)  Patient location during evaluation: PACU Anesthesia Type: MAC Level of consciousness: awake and alert Pain management: pain level controlled Vital Signs Assessment: post-procedure vital signs reviewed and stable Respiratory status: spontaneous breathing, nonlabored ventilation, respiratory function stable and patient connected to nasal cannula oxygen Cardiovascular status: stable and blood pressure returned to baseline Postop Assessment: no apparent nausea or vomiting Anesthetic complications: no   No notable events documented.   Last Vitals:  Vitals:   01/21/23 1250 01/21/23 1251  BP:  (!) 157/74  Pulse: 75 76  Resp: (!) 29 (!) 28  Temp:    SpO2: 94% 94%    Last Pain:  Vitals:   01/21/23 1251  PainSc: 0-No pain                 Arita Miss

## 2023-01-21 NOTE — Op Note (Signed)
OPERATIVE NOTE  [name] [id number] 01/21/2023   PREOPERATIVE DIAGNOSIS: Nuclear sclerotic cataract right eye. H25.11   POSTOPERATIVE DIAGNOSIS: Nuclear sclerotic cataract right eye. H25.11   PROCEDURE:  Phacoemusification with posterior chamber intraocular lens placement of the right eye  Ultrasound time: Procedure(s) with comments: CATARACT EXTRACTION PHACO AND INTRAOCULAR LENS PLACEMENT (IOC) RIGHT DIABETIC  4.89  00:45.6 (Right) - Diabetic  LENS:   Implant Name Type Inv. Item Serial No. Manufacturer Lot No. LRB No. Used Action  LENS IOL TECNIS EYHANCE 21.5 - [id number] Intraocular Lens LENS IOL TECNIS EYHANCE 21.5 [id number] [employer]  Right 1 Implanted      SURGEON:  Courtney Heys. Lazarus Salines, MD   ANESTHESIA:  Topical with tetracaine drops, augmented with 1% preservative-free intracameral lidocaine.   COMPLICATIONS:  None.   DESCRIPTION OF PROCEDURE:  The patient was identified in the holding room and transported to the operating room and placed in the supine position under the operating microscope.  The right eye was identified as the operative eye, which was prepped and draped in the usual sterile ophthalmic fashion.   A 1 millimeter clear-corneal paracentesis was made superotemporally. Preservative-free 1% lidocaine mixed with 1:1,000 bisulfite-free aqueous solution of epinephrine was injected into the anterior chamber. The anterior chamber was then filled with Viscoat viscoelastic. A 2.4 millimeter keratome was used to make a clear-corneal incision inferotemporally. A curvilinear capsulorrhexis was made with a cystotome and capsulorrhexis forceps. Balanced salt solution was used to hydrodissect and hydrodelineate the nucleus. Phacoemulsification was then used to remove the lens nucleus and epinucleus. The remaining cortex was then removed using the irrigation and aspiration handpiece. Provisc was then placed into the capsular bag to distend it for lens placement. A +21.50 D [id number]  intraocular lens was then injected into the capsular bag. The remaining viscoelastic was aspirated.   Wounds were hydrated with balanced salt solution.  The anterior chamber was inflated to a physiologic pressure with balanced salt solution.  No wound leaks were noted. Vigamox was injected intracamerally.  Timolol and Brimonidine drops were applied to the eye.  The patient was taken to the recovery room in stable condition without complications of anesthesia or surgery.  [name] 01/21/2023, 12:47 PM

## 2023-01-21 NOTE — Anesthesia Preprocedure Evaluation (Addendum)
Anesthesia Evaluation  Patient identified by MRN, date of birth, ID band Patient awake    Reviewed: Allergy & Precautions, H&P , NPO status , Patient's Chart, lab work & pertinent test results  History of Anesthesia Complications Negative for: history of anesthetic complications  Airway Mallampati: I  TM Distance: >3 FB Neck ROM: full    Dental  (+) Edentulous Upper, Edentulous Lower   Pulmonary neg sleep apnea, neg COPD, Patient abstained from smoking.Not current smoker, former smoker Prior laryngeal cancer s/p surgery and radiation. No neck stiffness.   Pulmonary exam normal breath sounds clear to auscultation       Cardiovascular Exercise Tolerance: Good METShypertension, + Peripheral Vascular Disease  (-) CAD and (-) Past MI Normal cardiovascular exam(-) dysrhythmias  Rhythm:regular Rate:Normal  B/l <50% carotid stenosis   Neuro/Psych negative neurological ROS  negative psych ROS   GI/Hepatic ,neg GERD  ,,(+)     (-) substance abuse    Endo/Other  diabetes, Well Controlled, Insulin Dependent    Renal/GU      Musculoskeletal   Abdominal   Peds  Hematology   Anesthesia Other Findings Past Medical History: No date: Back pain No date: Bladder cancer ([place]) 09/10/2020: COVID-19     Comment:  Received Casirivimab/Irdevimab infusion on 09/11/20 No date: Diabetes mellitus without complication (HCC) No date: HOH (hard of hearing) No date: Hypertension No date: Obesity No date: Renal cancer ([place]) No date: Renal cell cancer (HCC) No date: Throat cancer ([place]) No date: Tremor  Reproductive/Obstetrics                             Anesthesia Physical Anesthesia Plan  ASA: 3  Anesthesia Plan: MAC   Post-op Pain Management: Minimal or no pain anticipated   Induction: Intravenous  PONV Risk Score and Plan: 1 and Midazolam  Airway Management Planned: Nasal Cannula  Additional  Equipment:   Intra-op Plan:   Post-operative Plan:   Informed Consent: I have reviewed the patients History and Physical, chart, labs and discussed the procedure including the risks, benefits and alternatives for the proposed anesthesia with the patient or authorized representative who has indicated his/her understanding and acceptance.       Plan Discussed with: CRNA and Surgeon  Anesthesia Plan Comments: (Explained risks of anesthesia, including PONV, and rare emergencies such as cardiac events, respiratory problems, and allergic reactions, requiring invasive intervention. Discussed the role of CRNA in patient's perioperative care. Patient understands. )        Anesthesia Quick Evaluation

## 2023-01-21 NOTE — Discharge Instructions (Signed)
   Cataract Surgery, Care After ? ?This sheet gives you information about how to care for yourself after your surgery.  Your ophthalmologist may also give you more specific instructions.  If you have problems or questions, contact your doctor at [facility], [phone]. ? ?What can I expect after the surgery? ?It is common to have: ?Itching ?Foreign body sensation (feels like a grain of sand in the eye) ?Watery discharge (excess tearing) ?Sensitivity to light and touch ?Bruising in or around the eye ?Mild blurred vision ? ?Follow these instructions at home: ?Do not touch or rub your eyes. ?You may be told to wear a protective shield or sunglasses to protect your eyes. ?Do not put a contact lens in the operative eye unless your doctor approves. ?Keep the lids and face clean and dry. ?Do not allow water to hit you directly in the face while showering. ?Keep soap and shampoo out of your eyes. ?Do not use eye makeup for 1 week. ? ?Check your eye every day for signs of infection.  Watch for: ?Redness, swelling, or pain. ?Fluid, blood or pus. ?Worsening vision. ?Worsening sensitivity to light or touch. ? ?Activity: ?During the first day, avoid bending over and reading.  You may resume reading and bending the next day. ?Do not drive or use heavy machinery for at least 24 hours. ?Avoid strenuous activities for 1 week.  Activities such as walking, treadmill, exercise bike, and climbing stairs are okay. ?Do not lift heavy (>20 pound) objects for 1 week. ?Do not do yardwork, gardening, or dirty housework (mopping, cleaning bathrooms, vacuuming, etc.) for 1 week. ?Do not swim or use a hot tub for 2 weeks. ?Ask your doctor when you can return to work. ? ?General Instructions: ?Take or apply prescription and over-the-counter medicines as directed by your doctor, including eyedrops and ointments. ?Resume medications discontinued prior to surgery, unless told otherwise by your doctor. ?Keep all follow up appointments as  scheduled. ? ?Contact a health care provider if: ?You have increased bruising around your eye. ?You have pain that is not helped with medication. ?You have a fever. ?You have fluid, pus, or blood coming from your eye or incision. ?Your sensitivity to light gets worse. ?You have spots (floaters) of flashing lights in your vision. ?You have nausea or vomiting. ? ?Go to the nearest emergency room or call 911 if: ?You have sudden loss of vision. ?You have severe, worsening eye pain. ? ?

## 2023-01-22 ENCOUNTER — Encounter: Payer: Self-pay | Admitting: Ophthalmology

## 2023-01-26 ENCOUNTER — Other Ambulatory Visit: Payer: Self-pay | Admitting: Cardiovascular Disease

## 2023-01-26 DIAGNOSIS — R0602 Shortness of breath: Secondary | ICD-10-CM

## 2023-02-04 ENCOUNTER — Ambulatory Visit (INDEPENDENT_AMBULATORY_CARE_PROVIDER_SITE_OTHER): Payer: PPO | Admitting: Cardiovascular Disease

## 2023-02-04 DIAGNOSIS — R0602 Shortness of breath: Secondary | ICD-10-CM

## 2023-02-07 ENCOUNTER — Other Ambulatory Visit: Payer: Self-pay | Admitting: Cardiovascular Disease

## 2023-02-10 NOTE — Progress Notes (Signed)
Echo completed. Closing encounter for provider.

## 2023-02-19 DIAGNOSIS — Z961 Presence of intraocular lens: Secondary | ICD-10-CM | POA: Diagnosis not present

## 2023-02-20 ENCOUNTER — Other Ambulatory Visit (INDEPENDENT_AMBULATORY_CARE_PROVIDER_SITE_OTHER): Payer: Self-pay | Admitting: Nurse Practitioner

## 2023-02-23 ENCOUNTER — Ambulatory Visit (INDEPENDENT_AMBULATORY_CARE_PROVIDER_SITE_OTHER): Payer: PPO | Admitting: Cardiovascular Disease

## 2023-02-23 ENCOUNTER — Encounter: Payer: Self-pay | Admitting: Cardiovascular Disease

## 2023-02-23 VITALS — BP 140/60 | HR 69 | Ht 72.0 in | Wt 221.0 lb

## 2023-02-23 DIAGNOSIS — I6521 Occlusion and stenosis of right carotid artery: Secondary | ICD-10-CM

## 2023-02-23 DIAGNOSIS — E782 Mixed hyperlipidemia: Secondary | ICD-10-CM

## 2023-02-23 DIAGNOSIS — I10 Essential (primary) hypertension: Secondary | ICD-10-CM

## 2023-02-23 NOTE — Assessment & Plan Note (Signed)
Controlled. Continue rosuvastatin.

## 2023-02-23 NOTE — Assessment & Plan Note (Signed)
Followed by [facility].

## 2023-02-23 NOTE — Progress Notes (Signed)
Cardiology Office Note   Date:  02/23/2023   ID:  Martin, [name] 11/09/1946, MRN [id number]  PCP:  Martin Maltese, MD  Cardiologist:  Martin Laming, MD      History of Present Illness: [name] is a 77 y.o. male who presents for  Chief Complaint  Patient presents with   Follow-up    Follow up and echo results    Patient in office for routine cardiac exam. Denies chest pain, shortness of breath, edema, palpitations.      Past Medical History:  Diagnosis Date   Back pain    Bladder cancer ([place])    COVID-19 09/10/2020   Received Casirivimab/Irdevimab infusion on 09/11/20   Diabetes mellitus without complication (HCC)    HOH (hard of hearing)    Hypertension    Obesity    Renal cancer ([place])    Renal cell cancer ([place])    Throat cancer ([place])    Tremor      Past Surgical History:  Procedure Laterality Date   APPENDECTOMY     BLADDER SURGERY     CAROTID PTA/STENT INTERVENTION Right 08/26/2021   Procedure: CAROTID PTA/STENT INTERVENTION;  Surgeon: Katha Cabal, MD;  Location: [place] [facility];  Service: Cardiovascular;  Laterality: Right;   CATARACT EXTRACTION W/PHACO Right 01/21/2023   Procedure: CATARACT EXTRACTION PHACO AND INTRAOCULAR LENS PLACEMENT (IOC) RIGHT DIABETIC  4.89  00:45.6;  Surgeon: Norvel Richards, MD;  Location: [place];  Service: Ophthalmology;  Laterality: Right;  Diabetic   CERVICAL SPINE SURGERY     CHOLECYSTECTOMY N/A 03/23/2018   Procedure: LAPAROSCOPIC CHOLECYSTECTOMY;  Surgeon: Herbert Pun, MD;  Location: [name];  Service: General;  Laterality: N/A;   COLONOSCOPY     COLONOSCOPY WITH PROPOFOL N/A 11/10/2017   Procedure: COLONOSCOPY WITH PROPOFOL;  Surgeon: Toledo, Benay Pike, MD;  Location: [name];  Service: Gastroenterology;  Laterality: N/A;   ESOPHAGOGASTRODUODENOSCOPY (EGD) WITH PROPOFOL N/A 11/10/2017   Procedure: ESOPHAGOGASTRODUODENOSCOPY (EGD) WITH PROPOFOL;  Surgeon: Toledo,  Benay Pike, MD;  Location: [name];  Service: Gastroenterology;  Laterality: N/A;   HERNIA REPAIR     left od surgery     MICROLARYNGOSCOPY N/A 11/05/2020   Procedure: MICROLARYNGOSCOPY WITH BIOPSIES;  Surgeon: Clyde Canterbury, MD;  Location: [place];  Service: ENT;  Laterality: N/A;   NEPHRECTOMY     right kidney nephrectomy   rt elbow surgery     THROAT SURGERY  1990   for cancer removal at [facility]     Current Outpatient Medications  Medication Sig Dispense Refill   amitriptyline (ELAVIL) 25 MG tablet Take 75 mg by mouth at bedtime.      carvedilol (COREG) 6.25 MG tablet Take 6.25 mg by mouth 2 (two) times daily with a meal.     Cholecalciferol (VITAMIN D3) 1.25 MG (50000 UT) CAPS Take 50,000 Units by mouth every Saturday.     clopidogrel (PLAVIX) 75 MG tablet Take 1 tablet by mouth once daily 90 tablet 0   empagliflozin (JARDIANCE) 25 MG TABS tablet Take 25 mg by mouth daily.     glimepiride (AMARYL) 4 MG tablet Take 4 mg by mouth 2 (two) times daily.      insulin NPH-regular Human (NOVOLIN 70/30) (70-30) 100 UNIT/ML injection Inject 45 Units into the skin 2 (two) times daily with a meal.      rosuvastatin (CRESTOR) 40 MG tablet Take 40 mg by mouth daily.     tamsulosin (  FLOMAX) 0.4 MG CAPS capsule Take 0.4 mg by mouth at bedtime.     chlorthalidone (HYGROTON) 25 MG tablet Take 1 tablet by mouth once daily 90 tablet 0   fluticasone (FLONASE) 50 MCG/ACT nasal spray Place 2 sprays into both nostrils daily as needed for allergies or rhinitis.     lisinopril (PRINIVIL,ZESTRIL) 10 MG tablet Take 10 mg by mouth daily.     No current facility-administered medications for this visit.    Allergies:   Metformin    Social History:   reports that he quit smoking about 33 years ago. His smoking use included cigarettes. He has never used smokeless tobacco. He reports that he does not currently use alcohol. He reports that he does not use drugs.   Family History:  family  history includes CAD in his brother and brother; Diabetes in his brother; Hypertension in his brother.    ROS:     Review of Systems  Constitutional: Negative.   HENT: Negative.    Eyes: Negative.   Respiratory: Negative.    Gastrointestinal: Negative.   Genitourinary: Negative.   Musculoskeletal: Negative.   Skin: Negative.   Neurological: Negative.   Endo/Heme/Allergies: Negative.   Psychiatric/Behavioral: Negative.    All other systems reviewed and are negative.     All other systems are reviewed and negative.    PHYSICAL EXAM: VS:  BP (!) 140/60   Pulse 69   Ht 6' (1.829 m)   Wt 221 lb (100.2 kg)   SpO2 97%   BMI 29.97 kg/m  , BMI Body mass index is 29.97 kg/m. Last weight:  Wt Readings from Last 3 Encounters:  02/23/23 221 lb (100.2 kg)  01/21/23 216 lb (98 kg)  09/11/22 224 lb 12.8 oz (102 kg)     Physical Exam Vitals reviewed.  Constitutional:      Appearance: Normal appearance. He is normal weight.  HENT:     Head: Normocephalic.     Nose: Nose normal.     Mouth/Throat:     Mouth: Mucous membranes are moist.  Eyes:     Pupils: Pupils are equal, round, and reactive to light.  Cardiovascular:     Rate and Rhythm: Normal rate and regular rhythm.     Pulses: Normal pulses.     Heart sounds: Normal heart sounds.  Pulmonary:     Effort: Pulmonary effort is normal.  Abdominal:     General: Abdomen is flat. Bowel sounds are normal.  Musculoskeletal:        General: Normal range of motion.     Cervical back: Normal range of motion.  Skin:    General: Skin is warm.  Neurological:     General: No focal deficit present.     Mental Status: He is alert.  Psychiatric:        Mood and Affect: Mood normal.      EKG: none today  Recent Labs: No results found for requested labs within last 365 days.    Lipid Panel No results found for: "CHOL", "TRIG", "HDL", "CHOLHDL", "VLDL", "LDLCALC", "LDLDIRECT"    Other studies Reviewed: Patient: [id number] -  [name] DOB:  03/18/1946  Date:  06/16/2021 10:00 Provider: Neoma Laming MD Encounter: ALL ANGIOGRAMS (CTA BRAIN, CAROTIDS, RENAL ARTERIES, PE)   Page 2 REASON FOR VISIT  Referred by Dr.Calder Oblinger Humphrey [name].    TESTS  Imaging: Computed Tomographic Angiography:  Cardiac multidetector CT was performed paying particular attention to the coronary arteries for the diagnosis of:  Chest Pain. Diagnostic Drugs:  Administered iohexol (Omnipaque) through an antecubital vein and images from the examination were analyzed for the presence and extent of coronary artery disease, using 3D image processing software. 100 mL of non-ionic contrast (Omnipaque) was used.    TEST CONCLUSIONS  Quality of study: Excellent.  1-Calcium score: 1947.1  2-Left dominant system.  3-RCA very small non-dominant. Severely calcified LAD diffused by with moderate disease in mid portion. LCX is also severely calcified with 40% mid disease. Treat medically.   Martin Laming MD  Electronically signed by: [name]     Date: 06/17/2021 09:13  Patient: [id number] - [name] DOB:  [birth year]  Date:  06/03/2021 07:45 Provider: Neoma Laming MD Encounter: [facility] TEST   Page 1 TESTS    [facility] [address] [place], [place] [address] [phone] STUDY:  Gated Stress / Rest Myocardial Perfusion Imaging Tomographic (SPECT) Including attenuation correction Wall Motion, Left Ventricular Ejection Fraction By Gated Technique.Adenosine Stress Test. SEX: Male   WEIGHT: 235 lbs  HEIGHT: 73 in       ARMS UP: YES/NO                                                                                                                                                                                REFERRING PHYSICIAN: Dr.Whitfield Dulay Humphrey [name]                                                                                                                                                                                                                        INDICATION FOR STUDY: Left posterior fascicular block  TECHNIQUE:  Approximately 20 minutes following the intravenous administration of 10.7 mCi of Tc-42mSestamibi after stress testing in a reclined supine position with arms above their head if able to do so, gated SPECT imaging of the heart was performed. After about a 2hr break, the patient was injected intravenously with 30.2 mCi of Tc-959mestamibi.  Approximately 45 minutes later in the same position as stress imaging SPECT rest imaging of the heart was performed.  STRESS BY:  ShNeoma LamingMD PROTOCOL:  Adenosine  DOSE ADMIN: 20 ml   ROUTE OF ADMINISTRATION: IV                                                                            MAX PRED HR: 148                     85%: 126               75%: 111                                                                                                                   RESTING BP: 156/94  RESTING HR: 100  PEAK BP: 154/82   PEAK HR: 105                                                                   EXERCISE DURATION:    4 min injection                                            REASON FOR TEST TERMINATION:    Protocol end                                                                                                                              SYMPTOMS:   None  EKG RESULTS: Sinus tachycardia. 100/min. RBBB. No significant ST changes with adenosine.                                                             IMAGE QUALITY:  Good                                                                                                                                                                                                                                                                                                                                 PERFUSION/WALL MOTION FINDINGS: EF = 60%. Moderate size and intensity reversible apex (17), apical inferior, mid inferolateral, and basal inferolateral wall defects, normal wall motion.                                                                         IMPRESSION:  Equivocal stress test with normal LVEF, advise CCTA.  Martin Laming, MD Stress Interpreting Physician / Nuclear Interpreting Physician        Martin Laming MD  Electronically signed by: [name]     Date: 06/05/2021 10:53  Patient: [id number] - [name] DOB:  [birth year]  Date:  05/19/2021 11:15 Provider: Neoma Laming MD Encounter: ECHO   Page 2 REASON FOR VISIT  Visit for: Echocardiogram/Nonrheumatic Mitral valve insufficiency  Sex:   Male        wt= 239   lbs.  BP=118/72  Height=   66 inches.    TESTS  Imaging: Echocardiogram:  An echocardiogram in (2-d) mode was performed and in Doppler mode with color flow velocity mapping was performed. The aortic valve cusps are abnormal 2  cm, flow velocity 1.6  m/s, and systolic calculated mean flow gradient 5   mmHg. Mitral valve diastolic peak flow velocity E 1.0   m/s and E/A ratio 0.7. Aortic root diameter 3.4  cm. The LVOT internal diameter 2.1  cm and flow velocity was abnormal 1.0   m/s. LV systolic dimension 2.9   cm, diastolic 4.6  cm, posterior wall thickness 1.3  cm, fractional shortening 30 %, and  EF 48  %. IVS thickness 1.3   cm. LA dimension 3.9cm  RIGHT atrium=  17.4  cm2. Mitral Valve =  Ea=6.5  DT= 313 msec. Tricuspid Valve =  TR jet V=   2.1   RAP=5  RVSP= 21    mmHg. Aortic Valve is Normal. Pulmonic Valve= PIEDV=   0.7   m/s. Mitral Valve has Mild Regurgitation. Pulmonic Valve has Mild Regurgitation. Tricuspid Valve has Mild Regurgitation.     ASSESSMENT  Technically adequate study.  Ejection fraction-48%  Left Ventricle- Mildly dilated, Mildly decreased function  Left Ventricle diastolic dysfunction grade-Grade 1 relaxation abnormality  Mildly Hypokinetic Left ventricle apex    Right Ventricle- Mildly dilated, normal function  Normal right ventricular wall motion  Left Atrium-Moderately dilated  Right Atrium-Normal size  Aortic valve-Mild calcification of aortic valve cusps with No stenosis, No regurgitation  Pulmonic Valve-No stenosis, Mild regurgitation  Mitral Valve-Mild regurgitation  Tricuspid Valve-Mild Regurgitation  No Pericardial effusion.     THERAPY   Referring physician: Perrin [name]  Sonographer: Ruta Hinds.   Martin Laming MD  Electronically signed by: [name]     Date: 05/19/2021 15:34   ASSESSMENT AND PLAN:    ICD-10-CM   1. Benign essential hypertension  I10     2. Stenosis of right carotid artery  I65.21     3. Mixed hyperlipidemia  E78.2        Problem List Items Addressed This Visit       Cardiovascular and Mediastinum   Carotid stenosis    Followed by Ve[facility] and V[facility].       Benign essential hypertension - Primary    Controlled. Continue same medications.         Other   Hyperlipidemia    Controlled. Continue rosuvastatin.        Disposition:   Return in about 4 months (around 06/24/2023).    Total time spent: 30 minutes  Signed,  Martin Laming, MD  02/23/2023 1:07 [place]

## 2023-02-23 NOTE — Assessment & Plan Note (Signed)
Controlled. Continue same medications.

## 2023-03-01 ENCOUNTER — Encounter: Payer: Self-pay | Admitting: Internal Medicine

## 2023-03-01 ENCOUNTER — Ambulatory Visit (INDEPENDENT_AMBULATORY_CARE_PROVIDER_SITE_OTHER): Payer: PPO | Admitting: Internal Medicine

## 2023-03-01 VITALS — BP 128/70 | HR 68 | Ht 72.0 in | Wt 222.0 lb

## 2023-03-01 DIAGNOSIS — G250 Essential tremor: Secondary | ICD-10-CM

## 2023-03-01 DIAGNOSIS — E1142 Type 2 diabetes mellitus with diabetic polyneuropathy: Secondary | ICD-10-CM

## 2023-03-01 DIAGNOSIS — E559 Vitamin D deficiency, unspecified: Secondary | ICD-10-CM | POA: Diagnosis not present

## 2023-03-01 DIAGNOSIS — C329 Malignant neoplasm of larynx, unspecified: Secondary | ICD-10-CM

## 2023-03-01 DIAGNOSIS — L989 Disorder of the skin and subcutaneous tissue, unspecified: Secondary | ICD-10-CM

## 2023-03-01 DIAGNOSIS — I10 Essential (primary) hypertension: Secondary | ICD-10-CM

## 2023-03-01 DIAGNOSIS — N400 Enlarged prostate without lower urinary tract symptoms: Secondary | ICD-10-CM | POA: Diagnosis not present

## 2023-03-01 DIAGNOSIS — M5416 Radiculopathy, lumbar region: Secondary | ICD-10-CM | POA: Diagnosis not present

## 2023-03-01 DIAGNOSIS — Z794 Long term (current) use of insulin: Secondary | ICD-10-CM | POA: Diagnosis not present

## 2023-03-01 DIAGNOSIS — E782 Mixed hyperlipidemia: Secondary | ICD-10-CM

## 2023-03-01 DIAGNOSIS — E119 Type 2 diabetes mellitus without complications: Secondary | ICD-10-CM

## 2023-03-01 LAB — POCT CBG (FASTING - GLUCOSE)-MANUAL ENTRY: Glucose Fasting, POC: 120 mg/dL — AB (ref 70–99)

## 2023-03-01 MED ORDER — GABAPENTIN 100 MG PO CAPS: 100.0000 mg | ORAL_CAPSULE | Freq: Every day | ORAL | 2 refills | Status: DC

## 2023-03-01 NOTE — Progress Notes (Signed)
Established Patient Office Visit  Subjective:  Patient ID: [name], male    DOB: [birth year]  Age: 77 y.o. MRN: [id number]  Chief Complaint  Patient presents with   Follow-up    6 week follow up    Patient comes in for his follow-up today.  He recently underwent his cataract surgery and he says his vision has improved.  Now is getting scheduled for an EGD and colonoscopy to help diagnose the reason for his low hemoglobin. Patient is complaining of tingling numbness and some feeling of heaviness in his feet.  He agrees to start gabapentin to help with diabetic neuropathy.  Patient has history of lumbar radiculopathy.  Will set up a neurology consult for further evaluation. Patient reports of a lesion on the left side of his face which is not healing properly, scabs over and then comes back irritated.  Will set up a dermatology consult for possibility of skin cancer. BP is stable .     Past Medical History:  Diagnosis Date   Back pain    Bladder cancer ([place])    COVID-19 09/10/2020   Received Casirivimab/Irdevimab infusion on 09/11/20   Diabetes mellitus without complication (HCC)    HOH (hard of hearing)    Hypertension    Obesity    Renal cancer ([place])    Renal cell cancer ([place])    Throat cancer ([place])    Tremor    Past Surgical History:  Procedure Laterality Date   APPENDECTOMY     BLADDER SURGERY     CAROTID PTA/STENT INTERVENTION Right 08/26/2021   Procedure: CAROTID PTA/STENT INTERVENTION;  Surgeon: Katha Cabal, MD;  Location: [place] [facility];  Service: Cardiovascular;  Laterality: Right;   CATARACT EXTRACTION W/PHACO Right 01/21/2023   Procedure: CATARACT EXTRACTION PHACO AND INTRAOCULAR LENS PLACEMENT (IOC) RIGHT DIABETIC  4.89  00:45.6;  Surgeon: Norvel Richards, MD;  Location: [place];  Service: Ophthalmology;  Laterality: Right;  Diabetic   CERVICAL SPINE SURGERY     CHOLECYSTECTOMY N/A 03/23/2018   Procedure: LAPAROSCOPIC  CHOLECYSTECTOMY;  Surgeon: Herbert Pun, MD;  Location: [name];  Service: General;  Laterality: N/A;   COLONOSCOPY     COLONOSCOPY WITH PROPOFOL N/A 11/10/2017   Procedure: COLONOSCOPY WITH PROPOFOL;  Surgeon: Toledo, Benay Pike, MD;  Location: [name];  Service: Gastroenterology;  Laterality: N/A;   ESOPHAGOGASTRODUODENOSCOPY (EGD) WITH PROPOFOL N/A 11/10/2017   Procedure: ESOPHAGOGASTRODUODENOSCOPY (EGD) WITH PROPOFOL;  Surgeon: Toledo, Benay Pike, MD;  Location: [name];  Service: Gastroenterology;  Laterality: N/A;   HERNIA REPAIR     left od surgery     MICROLARYNGOSCOPY N/A 11/05/2020   Procedure: MICROLARYNGOSCOPY WITH BIOPSIES;  Surgeon: Clyde Canterbury, MD;  Location: [place];  Service: ENT;  Laterality: N/A;   NEPHRECTOMY     right kidney nephrectomy   rt elbow surgery     THROAT SURGERY  1990   for cancer removal at [facility] History   Marital status: Married    Spouse name: Not on file   Number of children: Not on file   Years of education: Not on file   Highest education level: Not on file  Occupational History   Not on file  Tobacco Use   Smoking status: Former    Types: Cigarettes    Quit date: 1991    Years since quitting: 33.1   Smokeless tobacco: Never  Vaping Use   Vaping Use:  Never used  Substance and Sexual Activity   Alcohol use: Not Currently    Comment: Occasional beer drinker   Drug use: No   Sexual activity: Yes  Other Topics Concern   Not on file  Social History Narrative   Lives at home with wife, active and independent at baseline   Social Determinants of Health   Financial Resource Strain: Not on file  Food Insecurity: Not on file  Transportation Needs: Not on file  Physical Activity: Not on file  Stress: Not on file  Social Connections: Not on file  Intimate Partner Violence: Not on file    Family History  Problem Relation Age of Onset   Diabetes Brother     Hypertension Brother    CAD Brother    CAD Brother     Allergies  Allergen Reactions   Metformin Diarrhea    Review of Systems  Constitutional:  Negative for chills, fever, malaise/fatigue and weight loss.  HENT:  Positive for hearing loss. Negative for congestion, ear discharge, ear pain and tinnitus.   Eyes:  Positive for blurred vision. Negative for photophobia, pain, discharge and redness.  Respiratory: Negative.  Negative for stridor.   Cardiovascular: Negative.   Gastrointestinal: Negative.   Genitourinary: Negative.   Musculoskeletal: Negative.   Neurological: Negative.   Psychiatric/Behavioral: Negative.         Objective:   BP 128/70   Pulse 68   Ht 6' (1.829 m)   Wt 222 lb (100.7 kg)   SpO2 98%   BMI 30.11 kg/m   Vitals:   03/01/23 0939  BP: 128/70  Pulse: 68  Height: 6' (1.829 m)  Weight: 222 lb (100.7 kg)  SpO2: 98%  BMI (Calculated): 30.1    Physical Exam Vitals and nursing note reviewed.  Constitutional:      Appearance: Normal appearance.  Cardiovascular:     Rate and Rhythm: Normal rate and regular rhythm.  Pulmonary:     Effort: Pulmonary effort is normal.     Breath sounds: Normal breath sounds.  Musculoskeletal:        General: Normal range of motion.     Cervical back: Normal range of motion and neck supple.  Skin:    Findings: Lesion (Left side of face) present.  Neurological:     Mental Status: He is alert and oriented to person, place, and time.  Psychiatric:        Mood and Affect: Mood normal.        Behavior: Behavior normal.      Results for orders placed or performed in visit on 03/01/23  POCT CBG (Fasting - Glucose)  Result Value Ref Range   Glucose Fasting, POC 120 (A) 70 - 99 mg/dL    Recent Results (from the past 2160 hour(s))  Glucose, capillary     Status: Abnormal   Collection Time: 01/21/23 11:12 AM  Result Value Ref Range   Glucose-Capillary 148 (H) 70 - 99 mg/dL    Comment: Glucose reference range  applies only to samples taken after fasting for at least 8 hours.  POCT CBG (Fasting - Glucose)     Status: Abnormal   Collection Time: 03/01/23  9:42 AM  Result Value Ref Range   Glucose Fasting, POC 120 (A) 70 - 99 mg/dL      Assessment & Plan:  Start Gabapentin. Problem List Items Addressed This Visit     Essential hypertension, benign   Insulin dependent type 2 diabetes mellitus ([place]) - Primary  Relevant Orders   POCT CBG (Fasting - Glucose) (Completed)   Hyperlipidemia   Benign essential tremor   Benign prostatic hyperplasia   Laryngeal carcinoma (HCC)   Vitamin D insufficiency   Diabetic polyneuropathy associated with type 2 diabetes mellitus (HCC)   Relevant Medications   gabapentin (NEURONTIN) 100 MG capsule   Other Relevant Orders   Ambulatory referral to Neurology   Skin lesion of face   Relevant Orders   Ambulatory referral to Dermatology   Lumbar radiculopathy, chronic   Relevant Medications   gabapentin (NEURONTIN) 100 MG capsule   Other Relevant Orders   Ambulatory referral to Neurology    Return in about 6 weeks (around 04/12/2023).   Total time spent: 30 minutes  Perrin Maltese, MD  03/01/2023

## 2023-03-10 ENCOUNTER — Other Ambulatory Visit (INDEPENDENT_AMBULATORY_CARE_PROVIDER_SITE_OTHER): Payer: Self-pay | Admitting: Nurse Practitioner

## 2023-03-10 DIAGNOSIS — I6523 Occlusion and stenosis of bilateral carotid arteries: Secondary | ICD-10-CM

## 2023-03-11 ENCOUNTER — Encounter (INDEPENDENT_AMBULATORY_CARE_PROVIDER_SITE_OTHER): Payer: Self-pay | Admitting: Nurse Practitioner

## 2023-03-11 ENCOUNTER — Ambulatory Visit (INDEPENDENT_AMBULATORY_CARE_PROVIDER_SITE_OTHER): Payer: PPO | Admitting: Nurse Practitioner

## 2023-03-11 ENCOUNTER — Ambulatory Visit (INDEPENDENT_AMBULATORY_CARE_PROVIDER_SITE_OTHER): Payer: PPO

## 2023-03-11 VITALS — BP 137/76 | HR 68 | Resp 16 | Ht 72.0 in | Wt 220.0 lb

## 2023-03-11 DIAGNOSIS — I6523 Occlusion and stenosis of bilateral carotid arteries: Secondary | ICD-10-CM | POA: Diagnosis not present

## 2023-03-11 DIAGNOSIS — M5416 Radiculopathy, lumbar region: Secondary | ICD-10-CM

## 2023-03-11 DIAGNOSIS — R202 Paresthesia of skin: Secondary | ICD-10-CM

## 2023-03-11 DIAGNOSIS — R200 Anesthesia of skin: Secondary | ICD-10-CM

## 2023-03-11 DIAGNOSIS — E119 Type 2 diabetes mellitus without complications: Secondary | ICD-10-CM | POA: Diagnosis not present

## 2023-03-11 DIAGNOSIS — Z794 Long term (current) use of insulin: Secondary | ICD-10-CM | POA: Diagnosis not present

## 2023-03-11 NOTE — Progress Notes (Incomplete)
Subjective:    Patient ID: [name], male    DOB: [birth year], 77 y.o.   MRN: [id number] Chief Complaint  Patient presents with  . Follow-up    ultrasound    HPI  Review of Systems     Objective:   Physical Exam  BP 137/76 (BP Location: Left Arm)   Pulse 68   Resp 16   Ht 6' (1.829 m)   Wt 220 lb (99.8 kg)   BMI 29.84 kg/m   Past Medical History:  Diagnosis Date  . Back pain   . Bladder cancer ([place])   . COVID-19 09/10/2020   Received Casirivimab/Irdevimab infusion on 09/11/20  . Diabetes mellitus without complication ([place])   . HOH (hard of hearing)   . Hypertension   . Obesity   . Renal cancer ([place])   . Renal cell cancer ([place])   . Throat cancer ([place])   . Tremor     Social History   Socioeconomic History  . Marital status: Married    Spouse name: Not on file  . Number of children: Not on file  . Years of education: Not on file  . Highest education level: Not on file  Occupational History  . Not on file  Tobacco Use  . Smoking status: Former    Types: Cigarettes    Quit date: 1991    Years since quitting: 33.2  . Smokeless tobacco: Never  Vaping Use  . Vaping Use: Never used  Substance and Sexual Activity  . Alcohol use: Not Currently    Comment: Occasional beer drinker  . Drug use: No  . Sexual activity: Yes  Other Topics Concern  . Not on file  Social History Narrative   Lives at home with wife, active and independent at baseline   Social Determinants of Health   Financial Resource Strain: Not on file  Food Insecurity: Not on file  Transportation Needs: Not on file  Physical Activity: Not on file  Stress: Not on file  Social Connections: Not on file  Intimate Partner Violence: Not on file    Past Surgical History:  Procedure Laterality Date  . APPENDECTOMY    . BLADDER SURGERY    . CAROTID PTA/STENT INTERVENTION Right 08/26/2021   Procedure: CAROTID PTA/STENT INTERVENTION;  Surgeon: Katha Cabal, MD;  Location: [facility];  Service: Cardiovascular;  Laterality: Right;  . CATARACT EXTRACTION W/PHACO Right 01/21/2023   Procedure: CATARACT EXTRACTION PHACO AND INTRAOCULAR LENS PLACEMENT (IOC) RIGHT DIABETIC  4.89  00:45.6;  Surgeon: Norvel Richards, MD;  Location: [facility];  Service: Ophthalmology;  Laterality: Right;  Diabetic  . CERVICAL SPINE SURGERY    . CHOLECYSTECTOMY N/A 03/23/2018   Procedure: LAPAROSCOPIC CHOLECYSTECTOMY;  Surgeon: Herbert Pun, MD;  Location: [name];  Service: General;  Laterality: N/A;  . COLONOSCOPY    . COLONOSCOPY WITH PROPOFOL N/A 11/10/2017   Procedure: COLONOSCOPY WITH PROPOFOL;  Surgeon: Toledo, Benay Pike, MD;  Location: [name];  Service: Gastroenterology;  Laterality: N/A;  . ESOPHAGOGASTRODUODENOSCOPY (EGD) WITH PROPOFOL N/A 11/10/2017   Procedure: ESOPHAGOGASTRODUODENOSCOPY (EGD) WITH PROPOFOL;  Surgeon: Toledo, Benay Pike, MD;  Location: [name];  Service: Gastroenterology;  Laterality: N/A;  . HERNIA REPAIR    . left od surgery    . MICROLARYNGOSCOPY N/A 11/05/2020   Procedure: MICROLARYNGOSCOPY WITH BIOPSIES;  Surgeon: Clyde Canterbury, MD;  Location: [facility];  Service: ENT;  Laterality: N/A;  . NEPHRECTOMY     right kidney  nephrectomy  . rt elbow surgery    . THROAT SURGERY  1990   for cancer removal at [place] History  Problem Relation Age of Onset  . Diabetes Brother   . Hypertension Brother   . CAD Brother   . CAD Brother     Allergies  Allergen Reactions  . Metformin Diarrhea       Latest Ref Rng & Units 08/27/2021    4:47 AM 01/14/2021    1:45 PM 01/06/2021    9:00 AM  CBC  WBC 4.0 - 10.5 K/uL 8.1  9.1  8.9   Hemoglobin 13.0 - 17.0 g/dL 12.9  15.3  15.5   Hematocrit 39.0 - 52.0 % 39.9  47.0  47.7   Platelets 150 - 400 K/uL 122  163  160       CMP     Component Value Date/Time   NA 139 08/27/2021 0447   K 3.8 08/27/2021 0447   CL 109 08/27/2021 0447   CO2 25  08/27/2021 0447   GLUCOSE 88 08/27/2021 0447   BUN 23 08/27/2021 0447   CREATININE 1.13 08/27/2021 0447   CALCIUM 9.8 08/27/2021 0447   PROT 7.8 03/25/2018 1218   ALBUMIN 2.6 (L) 03/25/2018 1218   AST 34 03/25/2018 1218   ALT 47 03/25/2018 1218   ALKPHOS 163 (H) 03/25/2018 1218   BILITOT 1.0 03/25/2018 1218   GFRNONAA >60 08/27/2021 0447   GFRAA >60 03/25/2018 1218     No results found.     Assessment & Plan:   1. Bilateral carotid artery stenosis *** - VAS US CAROTID  2. Numbness and tingling of both lower extremities ***  3. Lumbar radiculopathy, chronic ***  4. Insulin dependent type 2 diabetes mellitus (HCC) ***   Current Outpatient Medications on File Prior to Visit  Medication Sig Dispense Refill  . amitriptyline (ELAVIL) 25 MG tablet Take 75 mg by mouth at bedtime.     . chlorthalidone (HYGROTON) 25 MG tablet Take 1 tablet by mouth once daily 90 tablet 0  . Cholecalciferol (VITAMIN D3) 1.25 MG (50000 UT) CAPS Take 50,000 Units by mouth every Saturday.    . clopidogrel (PLAVIX) 75 MG tablet Take 1 tablet by mouth once daily 90 tablet 0  . empagliflozin (JARDIANCE) 25 MG TABS tablet Take 25 mg by mouth daily.    . fluticasone (FLONASE) 50 MCG/ACT nasal spray Place 2 sprays into both nostrils daily as needed for allergies or rhinitis.    Marland Kitchen gabapentin (NEURONTIN) 100 MG capsule Take 1 capsule (100 mg total) by mouth daily. 30 capsule 2  . glimepiride (AMARYL) 4 MG tablet Take 4 mg by mouth 2 (two) times daily.     . insulin NPH-regular Human (NOVOLIN 70/30) (70-30) 100 UNIT/ML injection Inject 45 Units into the skin 2 (two) times daily with a meal.     . lisinopril (PRINIVIL,ZESTRIL) 10 MG tablet Take 10 mg by mouth daily.    . rosuvastatin (CRESTOR) 40 MG tablet Take 40 mg by mouth daily.    . tamsulosin (FLOMAX) 0.4 MG CAPS capsule Take 0.4 mg by mouth at bedtime.     No current facility-administered medications on file prior to visit.    There are no Patient  Instructions on file for this visit. No follow-ups on file.   Kris Hartmann, NP

## 2023-03-11 NOTE — Progress Notes (Signed)
Subjective:    Patient ID: [name], male    DOB: Jun 03, 1946, 77 y.o.   MRN: [id number] Chief Complaint  Patient presents with   Follow-up    ultrasound    The patient is seen for follow up evaluation of carotid stenosis. The carotid stenosis followed by ultrasound.   The patient denies amaurosis fugax. There is no recent history of TIA symptoms or focal motor deficits. There is no prior documented CVA.  The patient is taking enteric-coated aspirin 81 mg daily.  There is no history of migraine headaches. There is no history of seizures.  The patient notes that he has been having issues with his left lower extremity going numb.  He notes that it has happened while he is driving.  This is understandably very concerned because he is unable to feel the pedals.  He also notes that this happens at other times such as when he is walking or sitting for long time frames.  The patient does have a history of neuropathy as well as lumbar radiculopathy.    There is no history of DVT, PE or superficial thrombophlebitis. No recent episodes of angina or shortness of breath documented.   Carotid Duplex done today shows 1 to 39% of the right ICA with 40 to 59% of the left ICA.  No change compared to last study in 2023    Review of Systems     Objective:   Physical Exam  BP 137/76 (BP Location: Left Arm)   Pulse 68   Resp 16   Ht 6' (1.829 m)   Wt 220 lb (99.8 kg)   BMI 29.84 kg/m   Past Medical History:  Diagnosis Date   Back pain    Bladder cancer ([place])    COVID-19 09/10/2020   Received Casirivimab/Irdevimab infusion on 09/11/20   Diabetes mellitus without complication (HCC)    HOH (hard of hearing)    Hypertension    Obesity    Renal cancer ([place])    Renal cell cancer ([place])    Throat cancer ([place])    Tremor     Social History   Socioeconomic History   Marital status: Married    Spouse name: Not on file   Number of children: Not on file   Years of education: Not on  file   Highest education level: Not on file  Occupational History   Not on file  Tobacco Use   Smoking status: Former    Types: Cigarettes    Quit date: 6    Years since quitting: 33.2   Smokeless tobacco: Never  Vaping Use   Vaping Use: Never used  Substance and Sexual Activity   Alcohol use: Not Currently    Comment: Occasional beer drinker   Drug use: No   Sexual activity: Yes  Other Topics Concern   Not on file  Social History Narrative   Lives at home with wife, active and independent at baseline   Social Determinants of Health   Financial Resource Strain: Not on file  Food Insecurity: Not on file  Transportation Needs: Not on file  Physical Activity: Not on file  Stress: Not on file  Social Connections: Not on file  Intimate Partner Violence: Not on file    Past Surgical History:  Procedure Laterality Date   APPENDECTOMY     BLADDER SURGERY     CAROTID PTA/STENT INTERVENTION Right 08/26/2021   Procedure: CAROTID PTA/STENT INTERVENTION;  Surgeon: Delana Meyer, Dolores Lory, MD;  Location:  [facility];  Service: Cardiovascular;  Laterality: Right;   CATARACT EXTRACTION W/PHACO Right 01/21/2023   Procedure: CATARACT EXTRACTION PHACO AND INTRAOCULAR LENS PLACEMENT (IOC) RIGHT DIABETIC  4.89  00:45.6;  Surgeon: Norvel Richards, MD;  Location: [facility];  Service: Ophthalmology;  Laterality: Right;  Diabetic   CERVICAL SPINE SURGERY     CHOLECYSTECTOMY N/A 03/23/2018   Procedure: LAPAROSCOPIC CHOLECYSTECTOMY;  Surgeon: Herbert Pun, MD;  Location: [name];  Service: General;  Laterality: N/A;   COLONOSCOPY     COLONOSCOPY WITH PROPOFOL N/A 11/10/2017   Procedure: COLONOSCOPY WITH PROPOFOL;  Surgeon: Toledo, Benay Pike, MD;  Location: [name];  Service: Gastroenterology;  Laterality: N/A;   ESOPHAGOGASTRODUODENOSCOPY (EGD) WITH PROPOFOL N/A 11/10/2017   Procedure: ESOPHAGOGASTRODUODENOSCOPY (EGD) WITH PROPOFOL;  Surgeon: Toledo, Benay Pike, MD;  Location: [name];  Service: Gastroenterology;  Laterality: N/A;   HERNIA REPAIR     left od surgery     MICROLARYNGOSCOPY N/A 11/05/2020   Procedure: MICROLARYNGOSCOPY WITH BIOPSIES;  Surgeon: Clyde Canterbury, MD;  Location: [facility];  Service: ENT;  Laterality: N/A;   NEPHRECTOMY     right kidney nephrectomy   rt elbow surgery     [place]   for cancer removal at [facility]    Family History  Problem Relation Age of Onset   Diabetes Brother    Hypertension Brother    CAD Brother    CAD Brother     Allergies  Allergen Reactions   Metformin Diarrhea       Latest Ref Rng & Units 08/27/2021    4:47 AM 01/14/2021    1:45 PM 01/06/2021    9:00 AM  CBC  WBC 4.0 - 10.5 K/uL 8.1  9.1  8.9   Hemoglobin 13.0 - 17.0 g/dL 12.9  15.3  15.5   Hematocrit 39.0 - 52.0 % 39.9  47.0  47.7   Platelets 150 - 400 K/uL 122  163  160       CMP     Component Value Date/Time   NA 139 08/27/2021 0447   K 3.8 08/27/2021 0447   CL 109 08/27/2021 0447   CO2 25 08/27/2021 0447   GLUCOSE 88 08/27/2021 0447   BUN 23 08/27/2021 0447   CREATININE 1.13 08/27/2021 0447   CALCIUM 9.8 08/27/2021 0447   PROT 7.8 03/25/2018 1218   ALBUMIN 2.6 (L) 03/25/2018 1218   AST 34 03/25/2018 1218   ALT 47 03/25/2018 1218   ALKPHOS 163 (H) 03/25/2018 1218   BILITOT 1.0 03/25/2018 1218   GFRNONAA >60 08/27/2021 0447   GFRAA >60 03/25/2018 1218     No results found.     Assessment & Plan:   1. Bilateral carotid artery stenosis Recommend:  Given the patient's asymptomatic subcritical stenosis no further invasive testing or surgery at this time.  Duplex ultrasound shows 1 to 39% stenosis of the right ICA with 40 to 59% stenosis of the left ICA  Continue antiplatelet therapy as prescribed Continue management of CAD, HTN and Hyperlipidemia Healthy heart diet,  encouraged exercise at least 4 times per week Follow up in 12 months with duplex ultrasound and physical  exam  - VAS US CAROTID  2. Numbness and tingling of both lower extremities The patient has numbness and tingling that occurs more so with prolonged sitting and certain positions.  I suspect this is more related to either his neuropathy or radiculopathy.  However we will be sure to  rule out a possible arterial cause with ABIs first.  3. Lumbar radiculopathy, chronic This may be the cause of the patient's numbness and tingling that is worsening.  If his arterial studies are unrevealing we will plan on referral to neurosurgery for evaluation.  4. Insulin dependent type 2 diabetes mellitus ([place]) Continue hypoglycemic medications as already ordered, these medications have been reviewed and there are no changes at this time.  Hgb A1C to be monitored as already arranged by primary service   Current Outpatient Medications on File Prior to Visit  Medication Sig Dispense Refill   amitriptyline (ELAVIL) 25 MG tablet Take 75 mg by mouth at bedtime.      chlorthalidone (HYGROTON) 25 MG tablet Take 1 tablet by mouth once daily 90 tablet 0   Cholecalciferol (VITAMIN D3) 1.25 MG (50000 UT) CAPS Take 50,000 Units by mouth every Saturday.     clopidogrel (PLAVIX) 75 MG tablet Take 1 tablet by mouth once daily 90 tablet 0   empagliflozin (JARDIANCE) 25 MG TABS tablet Take 25 mg by mouth daily.     fluticasone (FLONASE) 50 MCG/ACT nasal spray Place 2 sprays into both nostrils daily as needed for allergies or rhinitis.     gabapentin (NEURONTIN) 100 MG capsule Take 1 capsule (100 mg total) by mouth daily. 30 capsule 2   glimepiride (AMARYL) 4 MG tablet Take 4 mg by mouth 2 (two) times daily.      insulin NPH-regular Human (NOVOLIN 70/30) (70-30) 100 UNIT/ML injection Inject 45 Units into the skin 2 (two) times daily with a meal.      lisinopril (PRINIVIL,ZESTRIL) 10 MG tablet Take 10 mg by mouth daily.     rosuvastatin (CRESTOR) 40 MG tablet Take 40 mg by mouth daily.     tamsulosin (FLOMAX) 0.4 MG CAPS  capsule Take 0.4 mg by mouth at bedtime.     No current facility-administered medications on file prior to visit.    There are no Patient Instructions on file for this visit. No follow-ups on file.   Kris Hartmann, NP

## 2023-03-22 ENCOUNTER — Other Ambulatory Visit: Payer: Self-pay | Admitting: Internal Medicine

## 2023-03-22 ENCOUNTER — Other Ambulatory Visit: Payer: Self-pay | Admitting: Cardiovascular Disease

## 2023-03-26 ENCOUNTER — Other Ambulatory Visit: Payer: Self-pay | Admitting: Cardiovascular Disease

## 2023-03-26 DIAGNOSIS — I10 Essential (primary) hypertension: Secondary | ICD-10-CM

## 2023-03-29 ENCOUNTER — Other Ambulatory Visit: Payer: Self-pay

## 2023-03-29 ENCOUNTER — Other Ambulatory Visit: Payer: Self-pay | Admitting: Cardiovascular Disease

## 2023-03-29 DIAGNOSIS — I10 Essential (primary) hypertension: Secondary | ICD-10-CM

## 2023-03-30 ENCOUNTER — Other Ambulatory Visit (INDEPENDENT_AMBULATORY_CARE_PROVIDER_SITE_OTHER): Payer: Self-pay | Admitting: Nurse Practitioner

## 2023-03-30 DIAGNOSIS — R200 Anesthesia of skin: Secondary | ICD-10-CM

## 2023-04-07 ENCOUNTER — Ambulatory Visit (INDEPENDENT_AMBULATORY_CARE_PROVIDER_SITE_OTHER): Payer: PPO

## 2023-04-07 ENCOUNTER — Encounter (INDEPENDENT_AMBULATORY_CARE_PROVIDER_SITE_OTHER): Payer: Self-pay

## 2023-04-07 ENCOUNTER — Ambulatory Visit (INDEPENDENT_AMBULATORY_CARE_PROVIDER_SITE_OTHER): Payer: PPO | Admitting: Nurse Practitioner

## 2023-04-07 DIAGNOSIS — R202 Paresthesia of skin: Secondary | ICD-10-CM | POA: Diagnosis not present

## 2023-04-07 DIAGNOSIS — R200 Anesthesia of skin: Secondary | ICD-10-CM

## 2023-04-13 ENCOUNTER — Ambulatory Visit (INDEPENDENT_AMBULATORY_CARE_PROVIDER_SITE_OTHER): Payer: PPO | Admitting: Internal Medicine

## 2023-04-13 ENCOUNTER — Encounter: Payer: Self-pay | Admitting: Internal Medicine

## 2023-04-13 VITALS — BP 134/70 | HR 60 | Ht 72.0 in | Wt 222.0 lb

## 2023-04-13 DIAGNOSIS — E782 Mixed hyperlipidemia: Secondary | ICD-10-CM | POA: Diagnosis not present

## 2023-04-13 DIAGNOSIS — D508 Other iron deficiency anemias: Secondary | ICD-10-CM | POA: Diagnosis not present

## 2023-04-13 DIAGNOSIS — K439 Ventral hernia without obstruction or gangrene: Secondary | ICD-10-CM

## 2023-04-13 DIAGNOSIS — I10 Essential (primary) hypertension: Secondary | ICD-10-CM

## 2023-04-13 DIAGNOSIS — E1165 Type 2 diabetes mellitus with hyperglycemia: Secondary | ICD-10-CM

## 2023-04-13 LAB — POCT CBG (FASTING - GLUCOSE)-MANUAL ENTRY: Glucose Fasting, POC: 168 mg/dL — AB (ref 70–99)

## 2023-04-13 NOTE — Progress Notes (Signed)
Established Patient Office Visit  Subjective:  Patient ID: [name], male    DOB: February 21, 1946  Age: 77 y.o. MRN: [id number]  Chief Complaint  Patient presents with   Follow-up    6 week follow up    Patient comes in for follow-up today.  His vision has improved after his cataract surgery.  His EGD and colonoscopy are still pending.  He was seen by the vascular surgeon for his lower extremity pain but it is not considered to be vascular.  He still has to see the neurologist and has an appointment for that.  Patient is waiting for the dermatology referral for the lesions on his face. Patient notes that he had developed a Right Lateral abdominal wall hernia  after his Right nephrectomy, several years ago, which is now gotten worse.  He occasionally gets constipated and feels pain in his right side.  On exam the right lateral hernia does look more pronounced now . Its there since 2010  CT and had shown parts of colon in it. Will schedule a CT scan of the abdomen.    No other concerns at this time.   Past Medical History:  Diagnosis Date   Back pain    Bladder cancer    COVID-19 09/10/2020   Received Casirivimab/Irdevimab infusion on 09/11/20   Diabetes mellitus without complication    HOH (hard of hearing)    Hypertension    Obesity    Renal cancer    Renal cell cancer    Throat cancer    Tremor     Past Surgical History:  Procedure Laterality Date   APPENDECTOMY     BLADDER SURGERY     CAROTID PTA/STENT INTERVENTION Right 08/26/2021   Procedure: CAROTID PTA/STENT INTERVENTION;  Surgeon: Renford Dills, MD;  Location: [name];  Service: Cardiovascular;  Laterality: Right;   CATARACT EXTRACTION W/PHACO Right 01/21/2023   Procedure: CATARACT EXTRACTION PHACO AND INTRAOCULAR LENS PLACEMENT (IOC) RIGHT DIABETIC  4.89  00:45.6;  Surgeon: Estanislado Pandy, MD;  Location: [facility];  Service: Ophthalmology;  Laterality: Right;  Diabetic    CERVICAL SPINE SURGERY     CHOLECYSTECTOMY N/A 03/23/2018   Procedure: LAPAROSCOPIC CHOLECYSTECTOMY;  Surgeon: Carolan Shiver, MD;  Location: [name];  Service: General;  Laterality: N/A;   COLONOSCOPY     COLONOSCOPY WITH PROPOFOL N/A 11/10/2017   Procedure: COLONOSCOPY WITH PROPOFOL;  Surgeon: Toledo, Boykin Nearing, MD;  Location: [name];  Service: Gastroenterology;  Laterality: N/A;   ESOPHAGOGASTRODUODENOSCOPY (EGD) WITH PROPOFOL N/A 11/10/2017   Procedure: ESOPHAGOGASTRODUODENOSCOPY (EGD) WITH PROPOFOL;  Surgeon: Toledo, Boykin Nearing, MD;  Location: [name];  Service: Gastroenterology;  Laterality: N/A;   HERNIA REPAIR     left od surgery     MICROLARYNGOSCOPY N/A 11/05/2020   Procedure: MICROLARYNGOSCOPY WITH BIOPSIES;  Surgeon: Geanie Logan, MD;  Location: [facility];  Service: ENT;  Laterality: N/A;   NEPHRECTOMY     right kidney nephrectomy   rt elbow surgery     THROAT SURGERY  1990   for cancer removal at [facility]    Social History   Socioeconomic History   Marital status: Married    Spouse name: Not on file   Number of children: Not on file   Years of education: Not on file   Highest education level: Not on file  Occupational History   Not on file  Tobacco Use   Smoking status: Former    Types: Cigarettes  Quit date: 88    Years since quitting: 33.3   Smokeless tobacco: Never  Vaping Use   Vaping Use: Never used  Substance and Sexual Activity   Alcohol use: Not Currently    Comment: Occasional beer drinker   Drug use: No   Sexual activity: Yes  Other Topics Concern   Not on file  Social History Narrative   Lives at home with wife, active and independent at baseline   Social Determinants of Health   Financial Resource Strain: Not on file  Food Insecurity: Not on file  Transportation Needs: Not on file  Physical Activity: Not on file  Stress: Not on file  Social Connections: Not on file  Intimate Partner Violence: Not  on file    Family History  Problem Relation Age of Onset   Diabetes Brother    Hypertension Brother    CAD Brother    CAD Brother     Allergies  Allergen Reactions   Metformin Diarrhea    Review of Systems  Constitutional:  Negative for chills, diaphoresis, fever, malaise/fatigue and weight loss.  HENT:  Positive for hearing loss. Negative for congestion, ear pain, sinus pain, sore throat and tinnitus.   Eyes: Negative.   Respiratory: Negative.    Cardiovascular: Negative.   Gastrointestinal:  Positive for abdominal pain and constipation. Negative for blood in stool, heartburn, melena, nausea and vomiting.  Genitourinary: Negative.   Musculoskeletal: Negative.   Neurological: Negative.   Psychiatric/Behavioral: Negative.         Objective:   BP 134/70   Pulse 60   Ht 6' (1.829 m)   Wt 222 lb (100.7 kg)   SpO2 99%   BMI 30.11 kg/m   Vitals:   04/13/23 0954  BP: 134/70  Pulse: 60  Height: 6' (1.829 m)  Weight: 222 lb (100.7 kg)  SpO2: 99%  BMI (Calculated): 30.1    Physical Exam Vitals and nursing note reviewed.  Constitutional:      Appearance: Normal appearance.  Cardiovascular:     Rate and Rhythm: Normal rate and regular rhythm.  Pulmonary:     Effort: Pulmonary effort is normal.     Breath sounds: Normal breath sounds.  Abdominal:     Palpations: There is mass.     Tenderness: There is abdominal tenderness. There is no guarding or rebound.     Hernia: A hernia (Right Ventral hernia) is present.  Musculoskeletal:     Cervical back: Normal range of motion and neck supple.  Neurological:     General: No focal deficit present.     Mental Status: He is alert and oriented to person, place, and time.  Psychiatric:        Mood and Affect: Mood normal.        Behavior: Behavior normal.      Results for orders placed or performed in visit on 04/13/23  POCT CBG (Fasting - Glucose)  Result Value Ref Range   Glucose Fasting, POC 168 (A) 70 - 99  mg/dL    Recent Results (from the past 2160 hour(s))  Glucose, capillary     Status: Abnormal   Collection Time: 01/21/23 11:12 AM  Result Value Ref Range   Glucose-Capillary 148 (H) 70 - 99 mg/dL    Comment: Glucose reference range applies only to samples taken after fasting for at least 8 hours.  POCT CBG (Fasting - Glucose)     Status: Abnormal   Collection Time: 03/01/23  9:42 AM  Result  Value Ref Range   Glucose Fasting, POC 120 (A) 70 - 99 mg/dL  POCT CBG (Fasting - Glucose)     Status: Abnormal   Collection Time: 04/13/23  9:57 AM  Result Value Ref Range   Glucose Fasting, POC 168 (A) 70 - 99 mg/dL      Patient advised to continue all his medications.  He will proceed with his neurology and GI consult. Will schedule a CT scan of his abdomen to assess his lateral abdominal wall hernia.  Problem List Items Addressed This Visit     Essential hypertension, benign   Relevant Orders   CMP14+EGFR   Hyperlipidemia   Relevant Orders   Lipid Panel w/o Chol/HDL Ratio   Other Visit Diagnoses     Type 2 diabetes mellitus with hyperglycemia, without long-term current use of insulin    -  Primary   Relevant Orders   POCT CBG (Fasting - Glucose) (Completed)   Hemoglobin A1c   Hernia of abdominal wall       Relevant Orders   CT ABDOMEN PELVIS W WO CONTRAST   Other iron deficiency anemia       Relevant Orders   CBC With Differential       Return in about 3 months (around 07/13/2023).   Total time spent: 30 minutes  Margaretann Loveless, MD  04/13/2023

## 2023-04-16 ENCOUNTER — Other Ambulatory Visit: Payer: PPO

## 2023-04-16 DIAGNOSIS — I10 Essential (primary) hypertension: Secondary | ICD-10-CM | POA: Diagnosis not present

## 2023-04-16 DIAGNOSIS — E782 Mixed hyperlipidemia: Secondary | ICD-10-CM | POA: Diagnosis not present

## 2023-04-16 DIAGNOSIS — E1165 Type 2 diabetes mellitus with hyperglycemia: Secondary | ICD-10-CM | POA: Diagnosis not present

## 2023-04-16 DIAGNOSIS — D508 Other iron deficiency anemias: Secondary | ICD-10-CM | POA: Diagnosis not present

## 2023-04-17 LAB — CMP14+EGFR
ALT: 14 [IU]/L (ref 0–44)
AST: 15 [IU]/L (ref 0–40)
Albumin/Globulin Ratio: 1.8 (ref 1.2–2.2)
Albumin: 4.2 g/dL (ref 3.8–4.8)
Alkaline Phosphatase: 59 [IU]/L (ref 44–121)
BUN/Creatinine Ratio: 21 (ref 10–24)
BUN: 23 mg/dL (ref 8–27)
Bilirubin Total: 0.5 mg/dL (ref 0.0–1.2)
CO2: 23 mmol/L (ref 20–29)
Calcium: 11.2 mg/dL — ABNORMAL HIGH (ref 8.6–10.2)
Chloride: 100 mmol/L (ref 96–106)
Creatinine, Ser: 1.11 mg/dL (ref 0.76–1.27)
Globulin, Total: 2.4 g/dL (ref 1.5–4.5)
Glucose: 105 mg/dL — ABNORMAL HIGH (ref 70–99)
Potassium: 4 mmol/L (ref 3.5–5.2)
Sodium: 137 mmol/L (ref 134–144)
Total Protein: 6.6 g/dL (ref 6.0–8.5)
eGFR: 70 mL/min/{1.73_m2}

## 2023-04-17 LAB — CBC WITH DIFFERENTIAL/PLATELET
Basophils Absolute: 0.1 10*3/uL (ref 0.0–0.2)
Basos: 1 %
EOS (ABSOLUTE): 0.7 10*3/uL — ABNORMAL HIGH (ref 0.0–0.4)
Eos: 8 %
Hematocrit: 48.6 % (ref 37.5–51.0)
Hemoglobin: 15.1 g/dL (ref 13.0–17.7)
Immature Grans (Abs): 0 10*3/uL (ref 0.0–0.1)
Immature Granulocytes: 0 %
Lymphocytes Absolute: 1.5 10*3/uL (ref 0.7–3.1)
Lymphs: 16 %
MCH: 24.6 pg — ABNORMAL LOW (ref 26.6–33.0)
MCHC: 31.1 g/dL — ABNORMAL LOW (ref 31.5–35.7)
MCV: 79 fL (ref 79–97)
Monocytes Absolute: 0.7 10*3/uL (ref 0.1–0.9)
Monocytes: 8 %
Neutrophils Absolute: 6.1 10*3/uL (ref 1.4–7.0)
Neutrophils: 67 %
Platelets: 156 10*3/uL (ref 150–450)
RBC: 6.13 x10E6/uL — ABNORMAL HIGH (ref 4.14–5.80)
RDW: 16.2 % — ABNORMAL HIGH (ref 11.6–15.4)
WBC: 9 10*3/uL (ref 3.4–10.8)

## 2023-04-17 LAB — LIPID PANEL W/O CHOL/HDL RATIO
Cholesterol, Total: 122 mg/dL (ref 100–199)
HDL: 40 mg/dL (ref >39)
LDL Chol Calc (NIH): 63 mg/dL (ref 0–99)
Triglycerides: 103 mg/dL (ref 0–149)
VLDL Cholesterol Cal: 19 mg/dL (ref 5–40)

## 2023-04-17 LAB — HEMOGLOBIN A1C
Est. average glucose Bld gHb Est-mCnc: 148 mg/dL
Hgb A1c MFr Bld: 6.8 % — ABNORMAL HIGH (ref 4.8–5.6)

## 2023-04-20 ENCOUNTER — Ambulatory Visit (INDEPENDENT_AMBULATORY_CARE_PROVIDER_SITE_OTHER): Payer: PPO

## 2023-04-20 DIAGNOSIS — K439 Ventral hernia without obstruction or gangrene: Secondary | ICD-10-CM

## 2023-04-20 MED ORDER — IOHEXOL 300 MG/ML  SOLN
100.0000 mL | Freq: Once | INTRAMUSCULAR | Status: AC | PRN
  Administered 2023-04-20: 100 mL via INTRAVENOUS

## 2023-04-29 ENCOUNTER — Telehealth: Payer: Self-pay

## 2023-04-29 DIAGNOSIS — G629 Polyneuropathy, unspecified: Secondary | ICD-10-CM | POA: Diagnosis not present

## 2023-04-29 NOTE — Telephone Encounter (Signed)
Pt's wife called and left vm regarding CT results, if we have received the results? Please advise

## 2023-04-30 ENCOUNTER — Ambulatory Visit (INDEPENDENT_AMBULATORY_CARE_PROVIDER_SITE_OTHER): Payer: PPO | Admitting: Internal Medicine

## 2023-04-30 ENCOUNTER — Encounter: Payer: Self-pay | Admitting: Internal Medicine

## 2023-04-30 VITALS — BP 124/72 | HR 76 | Ht 72.0 in | Wt 222.0 lb

## 2023-04-30 DIAGNOSIS — Z85528 Personal history of other malignant neoplasm of kidney: Secondary | ICD-10-CM | POA: Diagnosis not present

## 2023-04-30 DIAGNOSIS — C329 Malignant neoplasm of larynx, unspecified: Secondary | ICD-10-CM | POA: Diagnosis not present

## 2023-04-30 DIAGNOSIS — E278 Other specified disorders of adrenal gland: Secondary | ICD-10-CM | POA: Diagnosis not present

## 2023-04-30 DIAGNOSIS — E1165 Type 2 diabetes mellitus with hyperglycemia: Secondary | ICD-10-CM

## 2023-04-30 DIAGNOSIS — E1142 Type 2 diabetes mellitus with diabetic polyneuropathy: Secondary | ICD-10-CM | POA: Diagnosis not present

## 2023-04-30 DIAGNOSIS — Z859 Personal history of malignant neoplasm, unspecified: Secondary | ICD-10-CM

## 2023-04-30 LAB — POCT CBG (FASTING - GLUCOSE)-MANUAL ENTRY: Glucose Fasting, POC: 107 mg/dL — AB (ref 70–99)

## 2023-04-30 NOTE — Progress Notes (Signed)
Established Patient Office Visit  Subjective:  Patient ID: [name], male    DOB: [birth year]  Age: 77 y.o. MRN: [id number]  Chief Complaint  Patient presents with   Follow-up    CT results    Patient comes in with his wife to discuss his CT abdomen and pelvis results.  Patient has a history of Right Spiglean Abdominal hernia as a result of his right nephrectomy done several years ago for renal cell cancer.  A CT scan was ordered since this hernia was becoming more prominent.  The results confirm the presence of the hernia. However more concerning is the appearance of a 9 mm nodule on his right adrenal gland for which an oncology consult has been recommended. Patient has history of recurrent laryngeal cancer, right kidney cancer status status post right nephrectomy, right ureteric cancer and bladder cancer over the years. Patient and his wife agree to set up referrals with the urologist as well as the oncologist.    No other concerns at this time.   Past Medical History:  Diagnosis Date   Back pain    Bladder cancer (HCC)    COVID-19 09/10/2020   Received Casirivimab/Irdevimab infusion on 09/11/20   Diabetes mellitus without complication (HCC)    HOH (hard of hearing)    Hypertension    Obesity    Renal cancer (HCC)    Renal cell cancer (HCC)    Throat cancer (HCC)    Tremor     Past Surgical History:  Procedure Laterality Date   APPENDECTOMY     BLADDER SURGERY     CAROTID PTA/STENT INTERVENTION Right 08/26/2021   Procedure: CAROTID PTA/STENT INTERVENTION;  Surgeon: Renford Dills, MD;  Location: [name] INVASIVE CV LAB;  Service: Cardiovascular;  Laterality: Right;   CATARACT EXTRACTION W/PHACO Right 01/21/2023   Procedure: CATARACT EXTRACTION PHACO AND INTRAOCULAR LENS PLACEMENT (IOC) RIGHT DIABETIC  4.89  00:45.6;  Surgeon: Estanislado Pandy, MD;  Location: [facility];  Service: Ophthalmology;  Laterality: Right;  Diabetic   CERVICAL SPINE SURGERY      CHOLECYSTECTOMY N/A 03/23/2018   Procedure: LAPAROSCOPIC CHOLECYSTECTOMY;  Surgeon: Carolan Shiver, MD;  Location: [name] ORS;  Service: General;  Laterality: N/A;   COLONOSCOPY     COLONOSCOPY WITH PROPOFOL N/A 11/10/2017   Procedure: COLONOSCOPY WITH PROPOFOL;  Surgeon: Toledo, Boykin Nearing, MD;  Location: [name] ENDOSCOPY;  Service: Gastroenterology;  Laterality: N/A;   ESOPHAGOGASTRODUODENOSCOPY (EGD) WITH PROPOFOL N/A 11/10/2017   Procedure: ESOPHAGOGASTRODUODENOSCOPY (EGD) WITH PROPOFOL;  Surgeon: Toledo, Boykin Nearing, MD;  Location: [name] ENDOSCOPY;  Service: Gastroenterology;  Laterality: N/A;   HERNIA REPAIR     left od surgery     MICROLARYNGOSCOPY N/A 11/05/2020   Procedure: MICROLARYNGOSCOPY WITH BIOPSIES;  Surgeon: Geanie Logan, MD;  Location: [facility];  Service: ENT;  Laterality: N/A;   NEPHRECTOMY     right kidney nephrectomy   rt elbow surgery     THROAT SURGERY  1990   for cancer removal at [facility]    Social History   Socioeconomic History   Marital status: Married    Spouse name: Not on file   Number of children: Not on file   Years of education: Not on file   Highest education level: Not on file  Occupational History   Not on file  Tobacco Use   Smoking status: Former    Types: Cigarettes    Quit date: 1991    Years since quitting: 61.3  Smokeless tobacco: Never  Vaping Use   Vaping Use: Never used  Substance and Sexual Activity   Alcohol use: Not Currently    Comment: Occasional beer drinker   Drug use: No   Sexual activity: Yes  Other Topics Concern   Not on file  Social History Narrative   Lives at home with wife, active and independent at baseline   Social Determinants of Health   Financial Resource Strain: Not on file  Food Insecurity: Not on file  Transportation Needs: Not on file  Physical Activity: Not on file  Stress: Not on file  Social Connections: Not on file  Intimate Partner Violence: Not on file    Family  History  Problem Relation Age of Onset   Diabetes Brother    Hypertension Brother    CAD Brother    CAD Brother     Allergies  Allergen Reactions   Metformin Diarrhea    Review of Systems  Constitutional:  Negative for chills, fever, malaise/fatigue and weight loss.  HENT:  Negative for hearing loss.   Respiratory:  Negative for cough, shortness of breath and wheezing.   Cardiovascular:  Negative for chest pain, palpitations and leg swelling.  Gastrointestinal:  Negative for abdominal pain, blood in stool, constipation, heartburn, nausea and vomiting.  Genitourinary:  Negative for dysuria, flank pain, hematuria and urgency.  Musculoskeletal: Negative.   Neurological:  Positive for tremors and sensory change.  Psychiatric/Behavioral:  Negative for depression and memory loss. The patient does not have insomnia.        Objective:   BP 124/72   Pulse 76   Ht 6' (1.829 m)   Wt 222 lb (100.7 kg)   SpO2 97%   BMI 30.11 kg/m   Vitals:   04/30/23 1047  BP: 124/72  Pulse: 76  Height: 6' (1.829 m)  Weight: 222 lb (100.7 kg)  SpO2: 97%  BMI (Calculated): 30.1    Physical Exam Vitals and nursing note reviewed.  Constitutional:      Appearance: Normal appearance.  Cardiovascular:     Rate and Rhythm: Normal rate and regular rhythm.  Pulmonary:     Effort: Pulmonary effort is normal.     Breath sounds: Normal breath sounds.  Musculoskeletal:        General: Normal range of motion.  Neurological:     Mental Status: He is alert.      Results for orders placed or performed in visit on 04/30/23  POCT CBG (Fasting - Glucose)  Result Value Ref Range   Glucose Fasting, POC 107 (A) 70 - 99 mg/dL    Recent Results (from the past 2160 hour(s))  POCT CBG (Fasting - Glucose)     Status: Abnormal   Collection Time: 03/01/23  9:42 AM  Result Value Ref Range   Glucose Fasting, POC 120 (A) 70 - 99 mg/dL  POCT CBG (Fasting - Glucose)     Status: Abnormal   Collection Time:  04/13/23  9:57 AM  Result Value Ref Range   Glucose Fasting, POC 168 (A) 70 - 99 mg/dL  [id number]     Status: Abnormal   Collection Time: 04/16/23  8:58 AM  Result Value Ref Range   Glucose 105 (H) 70 - 99 mg/dL   BUN 23 8 - 27 mg/dL   Creatinine, Ser 1.47 0.76 - 1.27 mg/dL   eGFR 70 >82 NF/AOZ/3.08   BUN/Creatinine Ratio 21 10 - 24   Sodium 137 134 - 144 mmol/L   Potassium  4.0 3.5 - 5.2 mmol/L   Chloride 100 96 - 106 mmol/L   CO2 23 20 - 29 mmol/L   Calcium 11.2 (H) 8.6 - 10.2 mg/dL   Total Protein 6.6 6.0 - 8.5 g/dL   Albumin 4.2 3.8 - 4.8 g/dL   Globulin, Total 2.4 1.5 - 4.5 g/dL   Albumin/Globulin Ratio 1.8 1.2 - 2.2   Bilirubin Total 0.5 0.0 - 1.2 mg/dL   Alkaline Phosphatase 59 44 - 121 IU/L   AST 15 0 - 40 IU/L   ALT 14 0 - 44 IU/L  Hemoglobin A1c     Status: Abnormal   Collection Time: 04/16/23  8:58 AM  Result Value Ref Range   Hgb A1c MFr Bld 6.8 (H) 4.8 - 5.6 %    Comment:          Prediabetes: 5.7 - 6.4          Diabetes: >6.4          Glycemic control for adults with diabetes: <7.0    Est. average glucose Bld gHb Est-mCnc 148 mg/dL  Lipid Panel w/o Chol/HDL Ratio     Status: None   Collection Time: 04/16/23  8:58 AM  Result Value Ref Range   Cholesterol, Total 122 100 - 199 mg/dL   Triglycerides 161 0 - 149 mg/dL   HDL 40 >09 mg/dL   VLDL Cholesterol Cal 19 5 - 40 mg/dL   LDL Chol Calc (NIH) 63 0 - 99 mg/dL  CBC with Differential/Platelet     Status: Abnormal   Collection Time: 04/16/23  8:58 AM  Result Value Ref Range   WBC 9.0 3.4 - 10.8 x10E3/uL   RBC 6.13 (H) 4.14 - 5.80 x10E6/uL   Hemoglobin 15.1 13.0 - 17.7 g/dL   Hematocrit 60.4 54.0 - 51.0 %   MCV 79 79 - 97 fL   MCH 24.6 (L) 26.6 - 33.0 pg   MCHC 31.1 (L) 31.5 - 35.7 g/dL   RDW 98.1 (H) 19.1 - 47.8 %   Platelets 156 150 - 450 x10E3/uL   Neutrophils 67 Not Estab. %   Lymphs 16 Not Estab. %   Monocytes 8 Not Estab. %   Eos 8 Not Estab. %   Basos 1 Not Estab. %   Neutrophils Absolute 6.1  1.4 - 7.0 x10E3/uL   Lymphocytes Absolute 1.5 0.7 - 3.1 x10E3/uL   Monocytes Absolute 0.7 0.1 - 0.9 x10E3/uL   EOS (ABSOLUTE) 0.7 (H) 0.0 - 0.4 x10E3/uL   Basophils Absolute 0.1 0.0 - 0.2 x10E3/uL   Immature Granulocytes 0 Not Estab. %   Immature Grans (Abs) 0.0 0.0 - 0.1 x10E3/uL  POCT CBG (Fasting - Glucose)     Status: Abnormal   Collection Time: 04/30/23 10:54 AM  Result Value Ref Range   Glucose Fasting, POC 107 (A) 70 - 99 mg/dL      Assessment & Plan:  Set up referrals for oncology and urology.  Continue other medications as such. Problem List Items Addressed This Visit   None Visit Diagnoses     Type 2 diabetes mellitus with hyperglycemia, without long-term current use of insulin (HCC)    -  Primary   Relevant Orders   POCT CBG (Fasting - Glucose) (Completed)   Personal history of kidney cancer       Relevant Orders   Ambulatory referral to Hematology / Oncology   Ambulatory referral to Urology       Follow up as scheduled.  Total time  spent: 25 minutes  Margaretann Loveless, MD  04/30/2023

## 2023-05-01 ENCOUNTER — Other Ambulatory Visit: Payer: Self-pay | Admitting: Cardiovascular Disease

## 2023-05-07 ENCOUNTER — Inpatient Hospital Stay: Payer: PPO | Admitting: Internal Medicine

## 2023-05-07 ENCOUNTER — Inpatient Hospital Stay: Payer: PPO

## 2023-05-10 ENCOUNTER — Inpatient Hospital Stay: Payer: PPO

## 2023-05-10 ENCOUNTER — Encounter: Payer: Self-pay | Admitting: Internal Medicine

## 2023-05-10 ENCOUNTER — Inpatient Hospital Stay: Payer: PPO | Attending: Internal Medicine | Admitting: Internal Medicine

## 2023-05-10 VITALS — BP 144/70 | HR 73 | Temp 96.6°F | Ht 72.0 in | Wt 220.6 lb

## 2023-05-10 DIAGNOSIS — Z85528 Personal history of other malignant neoplasm of kidney: Secondary | ICD-10-CM

## 2023-05-10 DIAGNOSIS — Z8521 Personal history of malignant neoplasm of larynx: Secondary | ICD-10-CM | POA: Diagnosis not present

## 2023-05-10 DIAGNOSIS — E279 Disorder of adrenal gland, unspecified: Secondary | ICD-10-CM | POA: Diagnosis not present

## 2023-05-10 DIAGNOSIS — Z08 Encounter for follow-up examination after completed treatment for malignant neoplasm: Secondary | ICD-10-CM

## 2023-05-10 DIAGNOSIS — E278 Other specified disorders of adrenal gland: Secondary | ICD-10-CM

## 2023-05-10 LAB — COMPREHENSIVE METABOLIC PANEL
ALT: 14 U/L (ref 0–44)
AST: 19 U/L (ref 15–41)
Albumin: 4.2 g/dL (ref 3.5–5.0)
Alkaline Phosphatase: 55 U/L (ref 38–126)
Anion gap: 11 (ref 5–15)
BUN: 24 mg/dL — ABNORMAL HIGH (ref 8–23)
CO2: 28 mmol/L (ref 22–32)
Calcium: 11.5 mg/dL — ABNORMAL HIGH (ref 8.9–10.3)
Chloride: 99 mmol/L (ref 98–111)
Creatinine, Ser: 1.05 mg/dL (ref 0.61–1.24)
GFR, Estimated: >60 mL/min (ref >60)
Glucose, Bld: 120 mg/dL — ABNORMAL HIGH (ref 70–99)
Potassium: 3.9 mmol/L (ref 3.5–5.1)
Sodium: 138 mmol/L (ref 135–145)
Total Bilirubin: 0.6 mg/dL (ref 0.3–1.2)
Total Protein: 7.7 g/dL (ref 6.5–8.1)

## 2023-05-10 LAB — CBC WITH DIFFERENTIAL/PLATELET
Abs Immature Granulocytes: 0.02 10*3/uL (ref 0.00–0.07)
Basophils Absolute: 0.1 10*3/uL (ref 0.0–0.1)
Basophils Relative: 1 %
Eosinophils Absolute: 0.7 10*3/uL — ABNORMAL HIGH (ref 0.0–0.5)
Eosinophils Relative: 8 %
HCT: 49.8 % (ref 39.0–52.0)
Hemoglobin: 15.6 g/dL (ref 13.0–17.0)
Immature Granulocytes: 0 %
Lymphocytes Relative: 14 %
Lymphs Abs: 1.2 10*3/uL (ref 0.7–4.0)
MCH: 25.1 pg — ABNORMAL LOW (ref 26.0–34.0)
MCHC: 31.3 g/dL (ref 30.0–36.0)
MCV: 80.1 fL (ref 80.0–100.0)
Monocytes Absolute: 0.6 10*3/uL (ref 0.1–1.0)
Monocytes Relative: 7 %
Neutro Abs: 6.1 10*3/uL (ref 1.7–7.7)
Neutrophils Relative %: 70 %
Platelets: 146 10*3/uL — ABNORMAL LOW (ref 150–400)
RBC: 6.22 MIL/uL — ABNORMAL HIGH (ref 4.22–5.81)
RDW: 17.3 % — ABNORMAL HIGH (ref 11.5–15.5)
WBC: 8.7 10*3/uL (ref 4.0–10.5)
nRBC: 0 % (ref 0.0–0.2)

## 2023-05-10 LAB — LACTATE DEHYDROGENASE: LDH: 121 U/L (ref 98–192)

## 2023-05-10 LAB — VITAMIN D 25 HYDROXY (VIT D DEFICIENCY, FRACTURES): Vit D, 25-Hydroxy: 37.18 ng/mL (ref 30–100)

## 2023-05-10 NOTE — Assessment & Plan Note (Signed)
#   April 2024 [PCP CT scan-incisional hernia] left adrenal enlargement and 9 mm right adrenal nodule.  It is unclear if patient has metastatic disease versus chronic benign enlargement.  PET scan 2023 [laryngeal cancer staging]-benign appearing left adrenal adenoma noted; however none on the right.  # At this time I would recommend further evaluation with MRI of the abdomen with and without contrast ASAP.  Discussed the possible need for biopsy based upon the results.  Hold off any pheochromocytoma workup.  # Elevated Calcium [11.4- PCP; April 2024]-patient asymptomatic.  Recommend CBC CMP PTH; PTH related peptide vitamin D levels.  # History of kidney cancer right side status post nephrectomy-question recurrence -see discussion regarding above adrenal masses.  Await above workup.  # History of laryngeal cancer-x 2 [most recent 2023; stage I]-s/p radiation-no clinical evidence of disease.  Monitor for now  Thank you Dr.Khan for allowing me to participate in the care of your pleasant patient. Please do not hesitate to contact me with questions or concerns in the interim.  # DISPOSITION:  # labs today- ordered-  # follow MD: 2-3 days after MRI abdomen is done; no lab- Dr.B  Dr.Neelam Welton Flakes

## 2023-05-10 NOTE — Progress Notes (Signed)
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  Accompanied by fwife  Iva Boop 77 y.o.  male laryngeal cancer; and also remote history of kidney cancer as summarized above is referred to [place] for further consideration of possible metastatic disease based on recent CAT scan.  Patient has a history of incisional hernia from his right kidney surgery.  Noted to have worsening swelling in the last few months by PCP.  Which led to a CAT scan-   April 2024 CAT scan showed enlarged left adrenal gland; 9 mm right adrenal nodule.  Referred to [place] for further evaluation recommendations.  Otherwise patient denies any unusual bone pain or nausea vomiting or headaches.  No chest pain or shortness of the cough.  Review of Systems  Constitutional:  Negative for chills, diaphoresis, fever, malaise/fatigue and weight loss.  HENT:  Negative for nosebleeds and sore throat.   Eyes:  Negative for double vision.  Respiratory:  Negative for cough, hemoptysis, sputum production, shortness of breath and wheezing.   Cardiovascular:  Negative for chest pain, palpitations, orthopnea and leg swelling.  Gastrointestinal:  Negative for abdominal pain, blood in stool, constipation, diarrhea, heartburn, melena, nausea and vomiting.  Genitourinary:  Negative for dysuria, frequency and urgency.  Musculoskeletal:  Positive for back  pain and joint pain.  Skin: Negative.  Negative for itching and rash.  Neurological:  Negative for dizziness, tingling, focal weakness, weakness and headaches.  Endo/Heme/Allergies:  Does not bruise/bleed easily.  Psychiatric/Behavioral:  Negative for depression. The patient is not nervous/anxious and does not have insomnia.     MEDICAL HISTORY:  Past Medical History:  Diagnosis Date   Back pain    Bladder cancer (HCC)    COVID-19 09/10/2020   Received Casirivimab/Irdevimab infusion on 09/11/20   Diabetes mellitus without complication (HCC)    HOH (hard of hearing)    Hypertension    Obesity    Renal cancer (HCC)    Renal cell cancer (HCC)    Throat cancer (HCC)    Tremor     SURGICAL HISTORY: Past Surgical History:  Procedure Laterality Date   APPENDECTOMY     BLADDER SURGERY     CAROTID PTA/STENT INTERVENTION Right 08/26/2021   Procedure: CAROTID PTA/STENT INTERVENTION;  Surgeon: Renford Dills, MD;  Location: [name] INVASIVE CV LAB;  Service: Cardiovascular;  Laterality: Right;   CATARACT EXTRACTION W/PHACO Right 01/21/2023   Procedure: CATARACT EXTRACTION PHACO AND INTRAOCULAR LENS PLACEMENT (IOC) RIGHT DIABETIC  4.89  00:45.6;  Surgeon: Estanislado Pandy, MD;  Location: [facility];  Service: Ophthalmology;  Laterality: Right;  Diabetic   CERVICAL SPINE SURGERY     CHOLECYSTECTOMY N/A 03/23/2018   Procedure: LAPAROSCOPIC CHOLECYSTECTOMY;  Surgeon: Carolan Shiver, MD;  Location: [name] ORS;  Service: General;  Laterality: N/A;   COLONOSCOPY     COLONOSCOPY WITH PROPOFOL N/A 11/10/2017   Procedure: COLONOSCOPY WITH PROPOFOL;  Surgeon: Toledo, Boykin Nearing, MD;  Location: [name] ENDOSCOPY;  Service: Gastroenterology;  Laterality: N/A;   ESOPHAGOGASTRODUODENOSCOPY (  EGD) WITH PROPOFOL N/A 11/10/2017   Procedure: ESOPHAGOGASTRODUODENOSCOPY (EGD) WITH PROPOFOL;  Surgeon: Toledo, Boykin Nearing, MD;  Location: [name] ENDOSCOPY;  Service: Gastroenterology;  Laterality: N/A;    HERNIA REPAIR     left od surgery     MICROLARYNGOSCOPY N/A 11/05/2020   Procedure: MICROLARYNGOSCOPY WITH BIOPSIES;  Surgeon: Geanie Logan, MD;  Location: [facility];  Service: ENT;  Laterality: N/A;   NEPHRECTOMY     right kidney nephrectomy   rt elbow surgery     THROAT SURGERY  1990   for cancer removal at [facility]    SOCIAL HISTORY: Social History   Socioeconomic History   Marital status: Married    Spouse name: Not on file   Number of children: Not on file   Years of education: Not on file   Highest education level: Not on file  Occupational History   Not on file  Tobacco Use   Smoking status: Former    Types: Cigarettes    Quit date: 1991    Years since quitting: 33.3   Smokeless tobacco: Never  Vaping Use   Vaping Use: Never used  Substance and Sexual Activity   Alcohol use: Not Currently    Comment: Occasional beer drinker   Drug use: No   Sexual activity: Yes  Other Topics Concern   Not on file  Social History Narrative   Lives at home with wife, active and independent at baseline   Social Determinants of Health   Financial Resource Strain: Not on file  Food Insecurity: No Food Insecurity (05/10/2023)   Hunger Vital Sign    Worried About Running Out of Food in the Last Year: Never true    Ran Out of Food in the Last Year: Never true  Transportation Needs: No Transportation Needs (05/10/2023)   PRAPARE - Administrator, Civil Service (Medical): No    Lack of Transportation (Non-Medical): No  Physical Activity: Not on file  Stress: Not on file  Social Connections: Not on file  Intimate Partner Violence: Not At Risk (05/10/2023)   Humiliation, Afraid, Rape, and Kick questionnaire    Fear of Current or Ex-Partner: No    Emotionally Abused: No    Physically Abused: No    Sexually Abused: No    FAMILY HISTORY: Family History  Problem Relation Age of Onset   Diabetes Brother    Hypertension Brother    CAD Brother    CAD  Brother     ALLERGIES:  is allergic to metformin.  MEDICATIONS:  Current Outpatient Medications  Medication Sig Dispense Refill   acetaminophen (TYLENOL) 650 MG CR tablet Take 1,300 mg by mouth every 8 (eight) hours as needed for pain.     amitriptyline (ELAVIL) 75 MG tablet Take 75 mg by mouth at bedtime.     aspirin EC 81 MG tablet Take 81 mg by mouth daily. Swallow whole.     benzonatate (TESSALON) 100 MG capsule Take 100 mg by mouth 3 (three) times daily as needed.     carvedilol (COREG) 6.25 MG tablet Take 1 tablet by mouth twice daily 180 tablet 0   chlorthalidone (HYGROTON) 25 MG tablet Take 1 tablet by mouth once daily 90 tablet 0   Cholecalciferol (VITAMIN D3) 50 MCG (2000 UT) capsule Take 2,000 Units by mouth daily.     clopidogrel (PLAVIX) 75 MG tablet Take 1 tablet by mouth once daily 90 tablet 0   empagliflozin (JARDIANCE) 25 MG TABS tablet Take 25  mg by mouth daily.     fluticasone (FLONASE) 50 MCG/ACT nasal spray Place 2 sprays into both nostrils daily as needed for allergies or rhinitis.     gabapentin (NEURONTIN) 100 MG capsule Take 1 capsule (100 mg total) by mouth daily. 30 capsule 2   glimepiride (AMARYL) 4 MG tablet Take 1/2 (one-half) tablet by mouth twice daily 90 tablet 0   ibuprofen (ADVIL) 200 MG tablet Take 200 mg by mouth as needed.     insulin NPH-regular Human (NOVOLIN 70/30) (70-30) 100 UNIT/ML injection Inject 45 Units into the skin 2 (two) times daily with a meal.      lisinopril (ZESTRIL) 5 MG tablet Take 1 tablet by mouth once daily 90 tablet 0   primidone (MYSOLINE) 50 MG tablet Take 50 mg by mouth at bedtime.     rosuvastatin (CRESTOR) 40 MG tablet Take 40 mg by mouth daily.     tamsulosin (FLOMAX) 0.4 MG CAPS capsule Take 0.4 mg by mouth at bedtime.     No current facility-administered medications for this visit.    PHYSICAL EXAMINATION:   Vitals:   05/10/23 1112  BP: (!) 144/70  Pulse: 73  Temp: (!) 96.6 F (35.9 C)  SpO2: 100%   Filed  Weights   05/10/23 1112  Weight: 220 lb 9.6 oz (100.1 kg)    Physical Exam Vitals and nursing note reviewed.  Constitutional:      Comments:     HENT:     Head: Normocephalic and atraumatic.     Mouth/Throat:     Mouth: Mucous membranes are moist.     Pharynx: No oropharyngeal exudate.  Eyes:     Pupils: Pupils are equal, round, and reactive to light.  Cardiovascular:     Rate and Rhythm: Normal rate and regular rhythm.  Pulmonary:     Effort: No respiratory distress.     Breath sounds: No wheezing.     Comments: Decreased breath sounds bilaterally at bases.  No wheeze or crackles Abdominal:     General: Bowel sounds are normal. There is no distension.     Palpations: Abdomen is soft. There is no mass.     Tenderness: There is no abdominal tenderness. There is no guarding or rebound.  Musculoskeletal:        General: No tenderness. Normal range of motion.     Cervical back: Normal range of motion and neck supple.  Skin:    General: Skin is warm.  Neurological:     Mental Status: He is alert and oriented to person, place, and time.  Psychiatric:        Mood and Affect: Affect normal.        Judgment: Judgment normal.     LABORATORY DATA:  I have reviewed the data as listed Lab Results  Component Value Date   WBC 9.0 04/16/2023   HGB 15.1 04/16/2023   HCT 48.6 04/16/2023   MCV 79 04/16/2023   PLT 156 04/16/2023   Recent Labs    04/16/23 0858  NA 137  K 4.0  CL 100  CO2 23  GLUCOSE 105*  BUN 23  CREATININE 1.11  CALCIUM 11.2*  PROT 6.6  ALBUMIN 4.2  AST 15  ALT 14  ALKPHOS 59  BILITOT 0.5    RADIOGRAPHIC STUDIES: I have personally reviewed the radiological images as listed and agreed with the findings in the report. No results found.   Adrenal mass less than 4 cm in diameter with history  of malignant neoplasm [facility]) # April 2024 [PCP CT scan-incisional hernia] left adrenal enlargement and 9 mm right adrenal nodule.  It is unclear if patient has  metastatic disease versus chronic benign enlargement.  PET scan 2023 [laryngeal cancer staging]-benign appearing left adrenal adenoma noted; however none on the right.  # At this time I would recommend further evaluation with MRI of the abdomen with and without contrast ASAP.  Discussed the possible need for biopsy based upon the results.  Hold off any pheochromocytoma workup.  # Elevated Calcium [11.4- PCP; April 2024]-patient asymptomatic.  Recommend CBC CMP PTH; PTH related peptide vitamin D levels.  # History of kidney cancer right side status post nephrectomy-question recurrence -see discussion regarding above adrenal masses.  Await above workup.  # History of laryngeal cancer-x 2 [most recent 2023; stage I]-s/p radiation-no clinical evidence of disease.  Monitor for now  Thank you Dr.Khan for allowing me to participate in the care of your pleasant patient. Please do not hesitate to contact me with questions or concerns in the interim.  # DISPOSITION:  # labs today- ordered-  # follow MD: 2-3 days after MRI abdomen is done; no lab- Dr.B  Dr.Neelam Welton Flakes Above plan of care was discussed with patient/family in detail.  My contact information was given to the patient/family.     Earna Coder, MD 05/10/2023 12:35 PM

## 2023-05-10 NOTE — Progress Notes (Signed)
C/o spot on adrenal gland, lt, rt kidney cancer/kidney removed 2005.  Is there any other scan/test that can be done to see if there's other cancers?   Can his type of cancer spread?

## 2023-05-11 LAB — PARATHYROID HORMONE, INTACT (NO CA): PTH: 30 pg/mL (ref 15–65)

## 2023-05-14 ENCOUNTER — Ambulatory Visit
Admission: RE | Admit: 2023-05-14 | Discharge: 2023-05-14 | Disposition: A | Discharge status: Home / Self Care | Payer: PPO | Source: Ambulatory Visit | Attending: Internal Medicine | Admitting: Internal Medicine

## 2023-05-14 DIAGNOSIS — E278 Other specified disorders of adrenal gland: Secondary | ICD-10-CM | POA: Diagnosis not present

## 2023-05-14 DIAGNOSIS — Z859 Personal history of malignant neoplasm, unspecified: Secondary | ICD-10-CM | POA: Insufficient documentation

## 2023-05-14 DIAGNOSIS — K449 Diaphragmatic hernia without obstruction or gangrene: Secondary | ICD-10-CM | POA: Diagnosis not present

## 2023-05-14 MED ORDER — GADOBUTROL 1 MMOL/ML IV SOLN
10.0000 mL | Freq: Once | INTRAVENOUS | Status: AC | PRN
  Administered 2023-05-14: 10 mL via INTRAVENOUS

## 2023-05-15 LAB — PTH-RELATED PEPTIDE: PTH-related peptide: <2 pmol/L

## 2023-05-18 ENCOUNTER — Other Ambulatory Visit (INDEPENDENT_AMBULATORY_CARE_PROVIDER_SITE_OTHER): Payer: Self-pay | Admitting: Nurse Practitioner

## 2023-05-19 ENCOUNTER — Inpatient Hospital Stay (HOSPITAL_BASED_OUTPATIENT_CLINIC_OR_DEPARTMENT_OTHER): Payer: PPO | Admitting: Internal Medicine

## 2023-05-19 ENCOUNTER — Encounter: Payer: Self-pay | Admitting: Internal Medicine

## 2023-05-19 VITALS — BP 156/74 | HR 68 | Temp 96.8°F | Wt 218.6 lb

## 2023-05-19 DIAGNOSIS — E279 Disorder of adrenal gland, unspecified: Secondary | ICD-10-CM | POA: Diagnosis not present

## 2023-05-19 DIAGNOSIS — Z859 Personal history of malignant neoplasm, unspecified: Secondary | ICD-10-CM

## 2023-05-19 DIAGNOSIS — E278 Other specified disorders of adrenal gland: Secondary | ICD-10-CM

## 2023-05-19 DIAGNOSIS — R0600 Dyspnea, unspecified: Secondary | ICD-10-CM

## 2023-05-19 NOTE — Progress Notes (Signed)
MRI results

## 2023-05-19 NOTE — Assessment & Plan Note (Addendum)
#   April 2024 [PCP CT scan-incisional hernia] left adrenal enlargement and 9 mm right adrenal nodule.  It is unclear if patient has metastatic disease versus chronic benign enlargement.  PET scan 2023 [laryngeal cancer staging]-benign appearing left adrenal adenoma noted; however none on the right.  MRI-abdomen because of the contrast- MAY 2024-  Small bilateral adrenal nodules, without significant macroscopic fat content however unchanged in comparison to multiple prior examinations dating back to at least 2010 and definitively benign, largest in the lateral limb of the left adrenal gland measuring 1.3 x 1.0 cm. No further follow-up or characterization is required. Status post right nephrectomy. No evidence of recurrent or metastatic disease in the abdomen. Large right lateral abdominal wall hernia, incompletely imaged.  # Elevated Calcium [11.4- PCP; April 2024]-patient asymptomatic.  Unlikely malignant cause of  hypercalcemia.  vitamin D level is normal; Normal PTH PTH related peptide. Will get CT scan- for on going dyspnea. If negative would defer to PCP/nephrology for further workup recommendations. Discussed with Dr.Khan.   # History of kidney cancer right side status post nephrectomy-no concerns for recurrence..  stable  # History of laryngeal cancer-x 2 [most recent 2023; stage I]-s/p radiation-no clinical evidence of disease.  Monitor for now.   RN- Will call with results # DISPOSITION:  # print copy of MRI # CT scan in 1-2 weeks # follow up as needed- Dr.B  # I reviewed the blood work- with the patient in detail; also reviewed the imaging independently [as summarized above]; and with the patient in detail.    Dr.Neelam Welton Flakes

## 2023-05-19 NOTE — Progress Notes (Signed)
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  Accompanied by fwife  Iva Boop 77 y.o.  male laryngeal cancer; and also remote history of kidney cancer as summarized above is referred to [place] for further consideration of possible metastatic disease based on recent CAT scan.  Patient is here to review the results of his MRI abdomen/also blood work.  Otherwise patient denies any unusual bone pain or nausea vomiting or headaches.  No chest pain or cough. Complains of chronic shortness of breath especially with exertion.   Review of Systems  Constitutional:  Negative for chills, diaphoresis, fever, malaise/fatigue and weight loss.  HENT:  Negative for nosebleeds and sore throat.   Eyes:  Negative for double vision.  Respiratory:  Negative for cough, hemoptysis, sputum production, shortness of breath and wheezing.   Cardiovascular:  Negative for chest pain, palpitations, orthopnea and leg swelling.  Gastrointestinal:  Negative for abdominal pain, blood in stool, constipation, diarrhea, heartburn, melena, nausea and vomiting.  Genitourinary:  Negative for dysuria, frequency and urgency.  Musculoskeletal:  Positive for back pain and joint pain.  Skin: Negative.  Negative for itching and rash.  Neurological:  Negative for dizziness, tingling, focal weakness, weakness and headaches.  Endo/Heme/Allergies:   Does not bruise/bleed easily.  Psychiatric/Behavioral:  Negative for depression. The patient is not nervous/anxious and does not have insomnia.     MEDICAL HISTORY:  Past Medical History:  Diagnosis Date   Back pain    Bladder cancer (HCC)    COVID-19 09/10/2020   Received Casirivimab/Irdevimab infusion on 09/11/20   Diabetes mellitus without complication (HCC)    HOH (hard of hearing)    Hypertension    Obesity    Renal cancer (HCC)    Renal cell cancer (HCC)    Throat cancer (HCC)    Tremor     SURGICAL HISTORY: Past Surgical History:  Procedure Laterality Date   APPENDECTOMY     BLADDER SURGERY     CAROTID PTA/STENT INTERVENTION Right 08/26/2021   Procedure: CAROTID PTA/STENT INTERVENTION;  Surgeon: Renford Dills, MD;  Location: [name] INVASIVE CV LAB;  Service: Cardiovascular;  Laterality: Right;   CATARACT EXTRACTION W/PHACO Right 01/21/2023   Procedure: CATARACT EXTRACTION PHACO AND INTRAOCULAR LENS PLACEMENT (IOC) RIGHT DIABETIC  4.89  00:45.6;  Surgeon: Estanislado Pandy, MD;  Location: [facility] SURGERY [facility]TR;  Service: Ophthalmology;  Laterality: Right;  Diabetic   CERVICAL SPINE SURGERY     CHOLECYSTECTOMY N/A 03/23/2018   Procedure: LAPAROSCOPIC CHOLECYSTECTOMY;  Surgeon: Carolan Shiver, MD;  Location: [name] ORS;  Service: General;  Laterality: N/A;   COLONOSCOPY     COLONOSCOPY WITH PROPOFOL N/A 11/10/2017   Procedure: COLONOSCOPY WITH PROPOFOL;  Surgeon: Toledo, Boykin Nearing, MD;  Location: [name] ENDOSCOPY;  Service: Gastroenterology;  Laterality: N/A;   ESOPHAGOGASTRODUODENOSCOPY (EGD) WITH PROPOFOL N/A 11/10/2017   Procedure: ESOPHAGOGASTRODUODENOSCOPY (EGD) WITH PROPOFOL;  Surgeon: Toledo, Boykin Nearing, MD;  Location: [name] ENDOSCOPY;  Service: Gastroenterology;  Laterality: N/A;   HERNIA REPAIR  left od surgery     MICROLARYNGOSCOPY N/A 11/05/2020   Procedure: MICROLARYNGOSCOPY WITH BIOPSIES;  Surgeon: Geanie Logan, MD;  Location: [facility] SURGERY [facility];   Service: ENT;  Laterality: N/A;   NEPHRECTOMY     right kidney nephrectomy   rt elbow surgery     THROAT SURGERY  1990   for cancer removal at [facility]    SOCIAL HISTORY: Social History   Socioeconomic History   Marital status: Married    Spouse name: Not on file   Number of children: Not on file   Years of education: Not on file   Highest education level: Not on file  Occupational History   Not on file  Tobacco Use   Smoking status: Former    Types: Cigarettes    Quit date: 1991    Years since quitting: 33.4   Smokeless tobacco: Never  Vaping Use   Vaping Use: Never used  Substance and Sexual Activity   Alcohol use: Not Currently    Comment: Occasional beer drinker   Drug use: No   Sexual activity: Yes  Other Topics Concern   Not on file  Social History Narrative   Lives at home with wife, active and independent at baseline   Social Determinants of Health   Financial Resource Strain: Not on file  Food Insecurity: No Food Insecurity (05/10/2023)   Hunger Vital Sign    Worried About Running Out of Food in the Last Year: Never true    Ran Out of Food in the Last Year: Never true  Transportation Needs: No Transportation Needs (05/10/2023)   PRAPARE - Administrator, Civil Service (Medical): No    Lack of Transportation (Non-Medical): No  Physical Activity: Not on file  Stress: Not on file  Social Connections: Not on file  Intimate Partner Violence: Not At Risk (05/10/2023)   Humiliation, Afraid, Rape, and Kick questionnaire    Fear of Current or Ex-Partner: No    Emotionally Abused: No    Physically Abused: No    Sexually Abused: No    FAMILY HISTORY: Family History  Problem Relation Age of Onset   Diabetes Brother    Hypertension Brother    CAD Brother    CAD Brother     ALLERGIES:  is allergic to metformin.  MEDICATIONS:  Current Outpatient Medications  Medication Sig Dispense Refill   acetaminophen (TYLENOL) 650 MG CR tablet Take  1,300 mg by mouth every 8 (eight) hours as needed for pain.     amitriptyline (ELAVIL) 75 MG tablet Take 75 mg by mouth at bedtime.     aspirin EC 81 MG tablet Take 81 mg by mouth daily. Swallow whole.     benzonatate (TESSALON) 100 MG capsule Take 100 mg by mouth 3 (three) times daily as needed.     carvedilol (COREG) 6.25 MG tablet Take 1 tablet by mouth twice daily 180 tablet 0   chlorthalidone (HYGROTON) 25 MG tablet Take 1 tablet by mouth once daily 90 tablet 0   Cholecalciferol (VITAMIN D3) 50 MCG (2000 UT) capsule Take 2,000 Units by mouth daily.     clopidogrel (PLAVIX) 75 MG tablet Take 1 tablet by mouth once daily 90 tablet 0   empagliflozin (JARDIANCE) 25 MG TABS tablet Take 25 mg by mouth daily.     fluticasone (FLONASE) 50 MCG/ACT nasal spray Place 2 sprays into both nostrils daily as needed for allergies or rhinitis.     gabapentin (NEURONTIN) 100 MG capsule  Take 1 capsule (100 mg total) by mouth daily. 30 capsule 2   glimepiride (AMARYL) 4 MG tablet Take 1/2 (one-half) tablet by mouth twice daily 90 tablet 0   ibuprofen (ADVIL) 200 MG tablet Take 200 mg by mouth as needed.     insulin NPH-regular Human (NOVOLIN 70/30) (70-30) 100 UNIT/ML injection Inject 45 Units into the skin 2 (two) times daily with a meal.      lisinopril (ZESTRIL) 5 MG tablet Take 1 tablet by mouth once daily 90 tablet 0   primidone (MYSOLINE) 50 MG tablet Take 50 mg by mouth at bedtime.     rosuvastatin (CRESTOR) 40 MG tablet Take 40 mg by mouth daily.     tamsulosin (FLOMAX) 0.4 MG CAPS capsule Take 0.4 mg by mouth at bedtime.     No current facility-administered medications for this visit.    PHYSICAL EXAMINATION:   Vitals:   05/19/23 0940  BP: (!) 156/74  Pulse: 68  Temp: (!) 96.8 F (36 C)  SpO2: 100%   Filed Weights   05/19/23 0940  Weight: 218 lb 9.6 oz (99.2 kg)    Physical Exam Vitals and nursing note reviewed.  Constitutional:      Comments:     HENT:     Head: Normocephalic  and atraumatic.     Mouth/Throat:     Mouth: Mucous membranes are moist.     Pharynx: No oropharyngeal exudate.  Eyes:     Pupils: Pupils are equal, round, and reactive to light.  Cardiovascular:     Rate and Rhythm: Normal rate and regular rhythm.  Pulmonary:     Effort: No respiratory distress.     Breath sounds: No wheezing.     Comments: Decreased breath sounds bilaterally at bases.  No wheeze or crackles Abdominal:     General: Bowel sounds are normal. There is no distension.     Palpations: Abdomen is soft. There is no mass.     Tenderness: There is no abdominal tenderness. There is no guarding or rebound.  Musculoskeletal:        General: No tenderness. Normal range of motion.     Cervical back: Normal range of motion and neck supple.  Skin:    General: Skin is warm.  Neurological:     Mental Status: He is alert and oriented to person, place, and time.  Psychiatric:        Mood and Affect: Affect normal.        Judgment: Judgment normal.     LABORATORY DATA:  I have reviewed the data as listed Lab Results  Component Value Date   WBC 8.7 05/10/2023   HGB 15.6 05/10/2023   HCT 49.8 05/10/2023   MCV 80.1 05/10/2023   PLT 146 (L) 05/10/2023   Recent Labs    04/16/23 0858 05/10/23 1235  NA 137 138  K 4.0 3.9  CL 100 99  CO2 23 28  GLUCOSE 105* 120*  BUN 23 24*  CREATININE 1.11 1.05  CALCIUM 11.2* 11.5*  GFRNONAA  --  >60  PROT 6.6 7.7  ALBUMIN 4.2 4.2  AST 15 19  ALT 14 14  ALKPHOS 59 55  BILITOT 0.5 0.6    RADIOGRAPHIC STUDIES: I have personally reviewed the radiological images as listed and agreed with the findings in the report. MR ABDOMEN W WO CONTRAST  Result Date: 05/15/2023 CLINICAL DATA:  Follow-up bilateral adrenal nodules, history of right renal cell carcinoma status post nephrectomy EXAM: MRI ABDOMEN  WITHOUT AND WITH CONTRAST TECHNIQUE: Multiplanar multisequence MR imaging of the abdomen was performed both before and after the  administration of intravenous contrast. CONTRAST:  10mL GADAVIST GADOBUTROL 1 MMOL/ML IV SOLN COMPARISON:  PET-CT, 11/20/2020, MR abdomen, 03/23/2018, CT abdomen pelvis, 04/26/2009 FINDINGS: Lower chest: No acute abnormality. Hepatobiliary: No focal liver abnormality is seen. Status post cholecystectomy. No biliary dilatation. Pancreas: Unremarkable. No pancreatic ductal dilatation or surrounding inflammatory changes. Spleen: Normal in size without significant abnormality. Adrenals/Urinary Tract: Small bilateral adrenal nodules, without significant macroscopic fat content however unchanged in comparison to multiple prior examinations dating back to at least 2010 and definitively benign, largest in the lateral limb of the left adrenal gland measuring 1.3 x 1.0 cm (series 4, image 15). Status post right nephrectomy. No suspicious soft tissue or contrast enhancement in the nephrectomy bed. The left kidney is normal, without renal calculi, solid lesion, or hydronephrosis. Stomach/Bowel: Stomach is within normal limits. No evidence of bowel wall thickening, distention, or inflammatory changes. Vascular/Lymphatic: Aortic atherosclerosis. No enlarged abdominal lymph nodes. Other: Large right lateral abdominal wall hernia, incompletely imaged (series 4, image 31). No ascites. Musculoskeletal: No acute or significant osseous findings. IMPRESSION: 1. Small bilateral adrenal nodules, without significant macroscopic fat content however unchanged in comparison to multiple prior examinations dating back to at least 2010 and definitively benign, largest in the lateral limb of the left adrenal gland measuring 1.3 x 1.0 cm. No further follow-up or characterization is required. 2. Status post right nephrectomy. No evidence of recurrent or metastatic disease in the abdomen. 3. Large right lateral abdominal wall hernia, incompletely imaged. Electronically Signed   By: Jearld Lesch M.D.   On: 05/15/2023 23:04     Adrenal mass less  than 4 cm in diameter with history of malignant neoplasm [facility]) # April 2024 [PCP CT scan-incisional hernia] left adrenal enlargement and 9 mm right adrenal nodule.  It is unclear if patient has metastatic disease versus chronic benign enlargement.  PET scan 2023 [laryngeal cancer staging]-benign appearing left adrenal adenoma noted; however none on the right.  MRI-abdomen because of the contrast- MAY 2024-  Small bilateral adrenal nodules, without significant macroscopic fat content however unchanged in comparison to multiple prior examinations dating back to at least 2010 and definitively benign, largest in the lateral limb of the left adrenal gland measuring 1.3 x 1.0 cm. No further follow-up or characterization is required. Status post right nephrectomy. No evidence of recurrent or metastatic disease in the abdomen. Large right lateral abdominal wall hernia, incompletely imaged.  # Elevated Calcium [11.4- PCP; April 2024]-patient asymptomatic.  Unlikely malignant cause of  hypercalcemia.  vitamin D level is normal; Normal PTH PTH related peptide. Will get CT scan- for on going dyspnea. If negative would defer to PCP/nephrology for further workup recommendations. Discussed with Dr.Khan.   # History of kidney cancer right side status post nephrectomy-no concerns for recurrence..  stable  # History of laryngeal cancer-x 2 [most recent 2023; stage I]-s/p radiation-no clinical evidence of disease.  Monitor for now.   RN- Will call with results # DISPOSITION:  # CT scan in 1-2 weeks # follow up as needed- Dr.B  Dr.Neelam Welton Flakes Above plan of care was discussed with patient/family in detail.  My contact information was given to the patient/family.     Earna Coder, MD 05/19/2023 10:33 AM

## 2023-06-01 ENCOUNTER — Ambulatory Visit: Payer: PPO

## 2023-06-01 DIAGNOSIS — R200 Anesthesia of skin: Secondary | ICD-10-CM | POA: Diagnosis not present

## 2023-06-01 DIAGNOSIS — R202 Paresthesia of skin: Secondary | ICD-10-CM | POA: Diagnosis not present

## 2023-06-19 ENCOUNTER — Other Ambulatory Visit: Payer: Self-pay | Admitting: Internal Medicine

## 2023-06-19 ENCOUNTER — Other Ambulatory Visit: Payer: Self-pay | Admitting: Cardiovascular Disease

## 2023-06-19 DIAGNOSIS — I10 Essential (primary) hypertension: Secondary | ICD-10-CM

## 2023-06-22 ENCOUNTER — Other Ambulatory Visit: Payer: Self-pay | Admitting: Cardiovascular Disease

## 2023-06-22 DIAGNOSIS — I10 Essential (primary) hypertension: Secondary | ICD-10-CM

## 2023-06-24 ENCOUNTER — Encounter: Payer: Self-pay | Admitting: Cardiovascular Disease

## 2023-06-24 ENCOUNTER — Ambulatory Visit (INDEPENDENT_AMBULATORY_CARE_PROVIDER_SITE_OTHER): Payer: PPO | Admitting: Cardiovascular Disease

## 2023-06-24 VITALS — BP 138/80 | HR 70 | Ht 72.0 in | Wt 219.0 lb

## 2023-06-24 DIAGNOSIS — I10 Essential (primary) hypertension: Secondary | ICD-10-CM | POA: Diagnosis not present

## 2023-06-24 DIAGNOSIS — E1142 Type 2 diabetes mellitus with diabetic polyneuropathy: Secondary | ICD-10-CM | POA: Diagnosis not present

## 2023-06-24 DIAGNOSIS — I2510 Atherosclerotic heart disease of native coronary artery without angina pectoris: Secondary | ICD-10-CM | POA: Diagnosis not present

## 2023-06-24 DIAGNOSIS — I6523 Occlusion and stenosis of bilateral carotid arteries: Secondary | ICD-10-CM

## 2023-06-24 DIAGNOSIS — E782 Mixed hyperlipidemia: Secondary | ICD-10-CM

## 2023-06-24 DIAGNOSIS — I5022 Chronic systolic (congestive) heart failure: Secondary | ICD-10-CM

## 2023-06-24 NOTE — Progress Notes (Signed)
Cardiology Office Note   Date:  06/24/2023   ID:  [name], DOB [birth year], MRN [id number]  PCP:  Margaretann Loveless, MD  Cardiologist:  Adrian Blackwater, MD      History of Present Illness: [name] is a 77 y.o. male who presents for  Chief Complaint  Patient presents with   Follow-up    4 months follow up    Doing well      Past Medical History:  Diagnosis Date   Back pain    Bladder cancer (HCC)    COVID-19 09/10/2020   Received Casirivimab/Irdevimab infusion on 09/11/20   Diabetes mellitus without complication (HCC)    HOH (hard of hearing)    Hypertension    Obesity    Renal cancer (HCC)    Renal cell cancer (HCC)    Throat cancer (HCC)    Tremor      Past Surgical History:  Procedure Laterality Date   APPENDECTOMY     BLADDER SURGERY     CAROTID PTA/STENT INTERVENTION Right 08/26/2021   Procedure: CAROTID PTA/STENT INTERVENTION;  Surgeon: Renford Dills, MD;  Location: [name] INVASIVE CV LAB;  Service: Cardiovascular;  Laterality: Right;   CATARACT EXTRACTION W/PHACO Right 01/21/2023   Procedure: CATARACT EXTRACTION PHACO AND INTRAOCULAR LENS PLACEMENT (IOC) RIGHT DIABETIC  4.89  00:45.6;  Surgeon: Estanislado Pandy, MD;  Location: [facility]ERY [facility];  Service: Ophthalmology;  Laterality: Right;  Diabetic   CERVICAL SPINE SURGERY     CHOLECYSTECTOMY N/A 03/23/2018   Procedure: LAPAROSCOPIC CHOLECYSTECTOMY;  Surgeon: Carolan Shiver, MD;  Location: [name] ORS;  Service: General;  Laterality: N/A;   COLONOSCOPY     COLONOSCOPY WITH PROPOFOL N/A 11/10/2017   Procedure: COLONOSCOPY WITH PROPOFOL;  Surgeon: Toledo, Boykin Nearing, MD;  Location: [name] ENDOSCOPY;  Service: Gastroenterology;  Laterality: N/A;   ESOPHAGOGASTRODUODENOSCOPY (EGD) WITH PROPOFOL N/A 11/10/2017   Procedure: ESOPHAGOGASTRODUODENOSCOPY (EGD) WITH PROPOFOL;  Surgeon: Toledo, Boykin Nearing, MD;  Location: [name] ENDOSCOPY;  Service: Gastroenterology;  Laterality: N/A;   HERNIA  REPAIR     left od surgery     MICROLARYNGOSCOPY N/A 11/05/2020   Procedure: MICROLARYNGOSCOPY WITH BIOPSIES;  Surgeon: Geanie Logan, MD;  Location: [facility] SUR[facility]ERY [facility];  Service: ENT;  Laterality: N/A;   NEPHRECTOMY     right kidney nephrectomy   rt elbow surgery     THROAT SURGERY  1990   for cancer removal at [facility]     Current Outpatient Medications  Medication Sig Dispense Refill   acetaminophen (TYLENOL) 650 MG CR tablet Take 1,300 mg by mouth every 8 (eight) hours as needed for pain.     amitriptyline (ELAVIL) 75 MG tablet Take 75 mg by mouth at bedtime.     aspirin EC 81 MG tablet Take 81 mg by mouth daily. Swallow whole.     benzonatate (TESSALON) 100 MG capsule Take 100 mg by mouth 3 (three) times daily as needed.     carvedilol (COREG) 6.25 MG tablet Take 1 tablet by mouth twice daily 180 tablet 0   chlorthalidone (HYGROTON) 25 MG tablet Take 1 tablet by mouth once daily 90 tablet 0   Cholecalciferol (VITAMIN D3) 50 MCG (2000 UT) capsule Take 2,000 Units by mouth daily.     clopidogrel (PLAVIX) 75 MG tablet Take 1 tablet by mouth once daily 90 tablet 0   empagliflozin (JARDIANCE) 25 MG TABS tablet Take 25 mg by mouth daily.     fluticasone (FLONASE) 50 MCG/ACT nasal  spray Place 2 sprays into both nostrils daily as needed for allergies or rhinitis.     gabapentin (NEURONTIN) 100 MG capsule Take 1 capsule (100 mg total) by mouth daily. 30 capsule 2   glimepiride (AMARYL) 4 MG tablet Take 1/2 (one-half) tablet by mouth twice daily 90 tablet 0   ibuprofen (ADVIL) 200 MG tablet Take 200 mg by mouth as needed.     insulin NPH-regular Human (NOVOLIN 70/30) (70-30) 100 UNIT/ML injection Inject 45 Units into the skin 2 (two) times daily with a meal.      lisinopril (ZESTRIL) 5 MG tablet Take 1 tablet by mouth once daily 90 tablet 0   primidone (MYSOLINE) 50 MG tablet Take 50 mg by mouth at bedtime.     rosuvastatin (CRESTOR) 40 MG tablet Take 40 mg by mouth daily.      tamsulosin (FLOMAX) 0.4 MG CAPS capsule Take 0.4 mg by mouth at bedtime.     No current facility-administered medications for this visit.    Allergies:   Metformin    Social History:   reports that he quit smoking about 33 years ago. His smoking use included cigarettes. He has never used smokeless tobacco. He reports that he does not currently use alcohol. He reports that he does not use drugs.   Family History:  family history includes CAD in his brother and brother; Diabetes in his brother; Hypertension in his brother.    ROS:     Review of Systems  Constitutional: Negative.   HENT: Negative.    Eyes: Negative.   Respiratory: Negative.    Gastrointestinal: Negative.   Genitourinary: Negative.   Musculoskeletal: Negative.   Skin: Negative.   Neurological: Negative.   Endo/Heme/Allergies: Negative.   Psychiatric/Behavioral: Negative.    All other systems reviewed and are negative.     All other systems are reviewed and negative.    PHYSICAL EXAM: VS:  BP 138/80   Pulse 70   Ht 6' (1.829 m)   Wt 219 lb (99.3 kg)   SpO2 97%   BMI 29.70 kg/m  , BMI Body mass index is 29.7 kg/m. Last weight:  Wt Readings from Last 3 Encounters:  06/24/23 219 lb (99.3 kg)  05/19/23 218 lb 9.6 oz (99.2 kg)  05/10/23 220 lb 9.6 oz (100.1 kg)     Physical Exam Vitals reviewed.  Constitutional:      Appearance: Normal appearance. He is normal weight.  HENT:     Head: Normocephalic.     Nose: Nose normal.     Mouth/Throat:     Mouth: Mucous membranes are moist.  Eyes:     Pupils: Pupils are equal, round, and reactive to light.  Cardiovascular:     Rate and Rhythm: Normal rate and regular rhythm.     Pulses: Normal pulses.     Heart sounds: Normal heart sounds.  Pulmonary:     Effort: Pulmonary effort is normal.  Abdominal:     General: Abdomen is flat. Bowel sounds are normal.  Musculoskeletal:        General: Normal range of motion.     Cervical back: Normal range of  motion.  Skin:    General: Skin is warm.  Neurological:     General: No focal deficit present.     Mental Status: He is alert.  Psychiatric:        Mood and Affect: Mood normal.       EKG:   Recent Labs: 05/10/2023: ALT 14; BUN  24; Creatinine, Ser 1.05; Hemoglobin 15.6; Platelets 146; Potassium 3.9; Sodium 138    Lipid Panel    Component Value Date/Time   CHOL 122 04/16/2023 0858   TRIG 103 04/16/2023 0858   HDL 40 04/16/2023 0858   LDLCALC 63 04/16/2023 0858      Other studies Reviewed: Additional studies/ records that were reviewed today include:  Review of the above records demonstrates:       No data to display            ASSESSMENT AND PLAN:    ICD-10-CM   1. Bilateral carotid artery stenosis  I65.23 MYOCARDIAL PERFUSION IMAGING    PCV ECHOCARDIOGRAM COMPLETE    2. Essential hypertension, benign  I10 MYOCARDIAL PERFUSION IMAGING    PCV ECHOCARDIOGRAM COMPLETE    3. Diabetic polyneuropathy associated with type 2 diabetes mellitus (HCC)  E11.42 MYOCARDIAL PERFUSION IMAGING    PCV ECHOCARDIOGRAM COMPLETE    4. Mixed hyperlipidemia  E78.2 MYOCARDIAL PERFUSION IMAGING    PCV ECHOCARDIOGRAM COMPLETE    5. Chronic systolic congestive heart failure, NYHA class 1 (HCC)  I50.22 MYOCARDIAL PERFUSION IMAGING    PCV ECHOCARDIOGRAM COMPLETE   LVEF 48% 2 years ago, appears compensated, check echo and stress test    6. Coronary artery disease involving native coronary artery of native heart without angina pectoris  I25.10 MYOCARDIAL PERFUSION IMAGING    PCV ECHOCARDIOGRAM COMPLETE   Moderate CAD, ca score 1600 2 years ago, advise f/u stress test       Problem List Items Addressed This Visit       Cardiovascular and Mediastinum   Carotid stenosis - Primary   Relevant Orders   MYOCARDIAL PERFUSION IMAGING   PCV ECHOCARDIOGRAM COMPLETE   Essential hypertension, benign   Relevant Orders   MYOCARDIAL PERFUSION IMAGING   PCV ECHOCARDIOGRAM COMPLETE      Endocrine   Diabetic polyneuropathy associated with type 2 diabetes mellitus (HCC)   Relevant Orders   MYOCARDIAL PERFUSION IMAGING   PCV ECHOCARDIOGRAM COMPLETE     Other   Hyperlipidemia   Relevant Orders   MYOCARDIAL PERFUSION IMAGING   PCV ECHOCARDIOGRAM COMPLETE   Other Visit Diagnoses     Chronic systolic congestive heart failure, NYHA class 1 (HCC)       LVEF 48% 2 years ago, appears compensated, check echo and stress test   Relevant Orders   MYOCARDIAL PERFUSION IMAGING   PCV ECHOCARDIOGRAM COMPLETE   Coronary artery disease involving native coronary artery of native heart without angina pectoris       Moderate CAD, ca score 1600 2 years ago, advise f/u stress test   Relevant Orders   MYOCARDIAL PERFUSION IMAGING   PCV ECHOCARDIOGRAM COMPLETE          Disposition:   Return in about 4 weeks (around 07/22/2023) for echo, stress test and f/u.    Total time spent: 30 minutes  Signed,  Adrian Blackwater, MD  06/24/2023 9:36 AM    [facility]

## 2023-06-25 ENCOUNTER — Ambulatory Visit (INDEPENDENT_AMBULATORY_CARE_PROVIDER_SITE_OTHER): Payer: PPO | Admitting: Internal Medicine

## 2023-06-25 ENCOUNTER — Encounter: Payer: Self-pay | Admitting: Internal Medicine

## 2023-06-25 VITALS — BP 150/80 | HR 77 | Ht 72.0 in | Wt 221.0 lb

## 2023-06-25 DIAGNOSIS — M5416 Radiculopathy, lumbar region: Secondary | ICD-10-CM

## 2023-06-25 DIAGNOSIS — I10 Essential (primary) hypertension: Secondary | ICD-10-CM | POA: Diagnosis not present

## 2023-06-25 DIAGNOSIS — L259 Unspecified contact dermatitis, unspecified cause: Secondary | ICD-10-CM

## 2023-06-25 DIAGNOSIS — E1142 Type 2 diabetes mellitus with diabetic polyneuropathy: Secondary | ICD-10-CM

## 2023-06-25 DIAGNOSIS — E782 Mixed hyperlipidemia: Secondary | ICD-10-CM

## 2023-06-25 MED ORDER — METHYLPREDNISOLONE 4 MG PO TBPK: ORAL_TABLET | ORAL | 0 refills | Status: DC

## 2023-06-25 MED ORDER — TRIAMCINOLONE ACETONIDE 0.025 % EX OINT: 1.0000 | TOPICAL_OINTMENT | Freq: Two times a day (BID) | CUTANEOUS | 1 refills | Status: DC

## 2023-06-25 MED ORDER — AMITRIPTYLINE HCL 25 MG PO TABS: 50.0000 mg | ORAL_TABLET | Freq: Every day | ORAL | 4 refills | Status: DC

## 2023-06-25 NOTE — Progress Notes (Signed)
Established Patient Office Visit  Subjective:  Patient ID: [name], male    DOB: 20-Nov-1946  Age: 77 y.o. MRN: [id number]  Chief Complaint  Patient presents with   Acute Visit    Skin Problems    Patient in office complaining of rash on bilateral forearms that burns and itches, started about 2 weeks ago. Was in contact with fiber glass 2 weeks ago. Gardens daily. Tried OTC hydrocortisone cream with no improvement. Medrol dose pk and Kenalog cream sent to the pharmacy. Took Benadryl, recommend Zyrtec. Patient complaining of Neuropathic pain. But unable to tolerate Gabapentin 100 mg or 300 mg prescribed by Neurologist. Advised to decrease Amitriptyline to 50 mg, restart gabapentin 100 mg. Adrenal mass found to be stable-   No other concerns at this time.   Past Medical History:  Diagnosis Date   Back pain    Bladder cancer (HCC)    COVID-19 09/10/2020   Received Casirivimab/Irdevimab infusion on 09/11/20   Diabetes mellitus without complication (HCC)    HOH (hard of hearing)    Hypertension    Obesity    Renal cancer (HCC)    Renal cell cancer (HCC)    Throat cancer (HCC)    Tremor     Past Surgical History:  Procedure Laterality Date   APPENDECTOMY     BLADDER SURGERY     CAROTID PTA/STENT INTERVENTION Right 08/26/2021   Procedure: CAROTID PTA/STENT INTERVENTION;  Surgeon: Renford Dills, MD;  Location: [name];  Service: Cardiovascular;  Laterality: Right;   CATARACT EXTRACTION W/PHACO Right 01/21/2023   Procedure: CATARACT EXTRACTION PHACO AND INTRAOCULAR LENS PLACEMENT (IOC) RIGHT DIABETIC  4.89  00:45.6;  Surgeon: Estanislado Pandy, MD;  Location: [facility];  Service: Ophthalmology;  Laterality: Right;  Diabetic   CERVICAL SPINE SURGERY     CHOLECYSTECTOMY N/A 03/23/2018   Procedure: LAPAROSCOPIC CHOLECYSTECTOMY;  Surgeon: Carolan Shiver, MD;  Location: [name] ORS;  Service: General;  Laterality: N/A;   COLONOSCOPY      COLONOSCOPY WITH PROPOFOL N/A 11/10/2017   Procedure: COLONOSCOPY WITH PROPOFOL;  Surgeon: Toledo, Boykin Nearing, MD;  Location: [name];  Service: Gastroenterology;  Laterality: N/A;   ESOPHAGOGASTRODUODENOSCOPY (EGD) WITH PROPOFOL N/A 11/10/2017   Procedure: ESOPHAGOGASTRODUODENOSCOPY (EGD) WITH PROPOFOL;  Surgeon: Toledo, Boykin Nearing, MD;  Location: [name];  Service: Gastroenterology;  Laterality: N/A;   HERNIA REPAIR     left od surgery     MICROLARYNGOSCOPY N/A 11/05/2020   Procedure: MICROLARYNGOSCOPY WITH BIOPSIES;  Surgeon: Geanie Logan, MD;  Location: [facility];  Service: ENT;  Laterality: N/A;   NEPHRECTOMY     right kidney nephrectomy   rt elbow surgery     THROAT SURGERY  1990   for cancer removal at [facility]    Social History   Socioeconomic History   Marital status: Married    Spouse name: Not on file   Number of children: Not on file   Years of education: Not on file   Highest education level: Not on file  Occupational History   Not on file  Tobacco Use   Smoking status: Former    Types: Cigarettes    Quit date: 70    Years since quitting: 33.5   Smokeless tobacco: Never  Vaping Use   Vaping Use: Never used  Substance and Sexual Activity   Alcohol use: Not Currently    Comment: Occasional beer drinker   Drug use: No   Sexual activity: Yes  Other Topics Concern   Not on file  Social History Narrative   Lives at home with wife, active and independent at baseline   Social Determinants of Health   Financial Resource Strain: Not on file  Food Insecurity: No Food Insecurity (05/10/2023)   Hunger Vital Sign    Worried About Running Out of Food in the Last Year: Never true    Ran Out of Food in the Last Year: Never true  Transportation Needs: No Transportation Needs (05/10/2023)   PRAPARE - Administrator, Civil Service (Medical): No    Lack of Transportation (Non-Medical): No  Physical Activity: Not on file  Stress: Not  on file  Social Connections: Not on file  Intimate Partner Violence: Not At Risk (05/10/2023)   Humiliation, Afraid, Rape, and Kick questionnaire    Fear of Current or Ex-Partner: No    Emotionally Abused: No    Physically Abused: No    Sexually Abused: No    Family History  Problem Relation Age of Onset   Diabetes Brother    Hypertension Brother    CAD Brother    CAD Brother     Allergies  Allergen Reactions   Metformin Diarrhea    Review of Systems  Constitutional: Negative.  Negative for chills, diaphoresis, fever and weight loss.  HENT:  Positive for ear pain, nosebleeds and sinus pain. Negative for hearing loss.   Eyes: Negative.   Respiratory: Negative.  Negative for cough and shortness of breath.   Cardiovascular: Negative.  Negative for chest pain, palpitations and leg swelling.  Gastrointestinal: Negative.  Negative for abdominal pain, constipation, diarrhea, heartburn, nausea and vomiting.  Genitourinary: Negative.  Negative for dysuria and flank pain.  Musculoskeletal: Negative.  Negative for joint pain and myalgias.  Skin:  Positive for itching and rash.  Neurological: Negative.  Negative for dizziness and headaches.  Endo/Heme/Allergies: Negative.   Psychiatric/Behavioral: Negative.  Negative for depression and suicidal ideas. The patient is not nervous/anxious.        Objective:   BP (!) 150/80   Pulse 77   Ht 6' (1.829 m)   Wt 221 lb (100.2 kg)   SpO2 98%   BMI 29.97 kg/m   Vitals:   06/25/23 1331  BP: (!) 150/80  Pulse: 77  Height: 6' (1.829 m)  Weight: 221 lb (100.2 kg)  SpO2: 98%  BMI (Calculated): 29.97    Physical Exam Vitals and nursing note reviewed.  Constitutional:      General: He is not in acute distress.    Appearance: Normal appearance.  HENT:     Head: Normocephalic and atraumatic.     Nose: Nose normal.     Mouth/Throat:     Mouth: Mucous membranes are moist.     Pharynx: Oropharynx is clear.  Eyes:      Conjunctiva/sclera: Conjunctivae normal.     Pupils: Pupils are equal, round, and reactive to light.  Cardiovascular:     Rate and Rhythm: Normal rate and regular rhythm.     Pulses: Normal pulses.     Heart sounds: Normal heart sounds.  Pulmonary:     Effort: Pulmonary effort is normal.     Breath sounds: Normal breath sounds.  Abdominal:     General: Bowel sounds are normal.     Palpations: Abdomen is soft.  Musculoskeletal:        General: Normal range of motion.     Cervical back: Normal range of motion.  Skin:  General: Skin is warm and dry.  Neurological:     General: No focal deficit present.     Mental Status: He is alert and oriented to person, place, and time.  Psychiatric:        Mood and Affect: Mood normal.        Behavior: Behavior normal.        Judgment: Judgment normal.      No results found for any visits on 06/25/23.      Assessment & Plan:  Patient complaining of rash bilateral arms. Recommend kenalog cream, medrol dose pack, Zyrtec. Back pain, decrease amitriptyline, increase gabapentin. Keep appointment scheduled in July.   Problem List Items Addressed This Visit     Essential hypertension, benign   Hyperlipidemia   Diabetic polyneuropathy associated with type 2 diabetes mellitus (HCC)   Relevant Medications   amitriptyline (ELAVIL) 25 MG tablet   Lumbar radiculopathy, chronic   Relevant Medications   amitriptyline (ELAVIL) 25 MG tablet   Contact dermatitis - Primary    Return in about 4 weeks (around 07/23/2023) for keep July appintment already scheduled.   Total time spent: 25 minutes  Margaretann Loveless, MD  06/25/2023   This document may have been prepared by [facility] Voice Recognition software and as such may include unintentional dictation errors.

## 2023-07-04 ENCOUNTER — Other Ambulatory Visit: Payer: Self-pay | Admitting: Internal Medicine

## 2023-07-04 DIAGNOSIS — E1142 Type 2 diabetes mellitus with diabetic polyneuropathy: Secondary | ICD-10-CM

## 2023-07-05 ENCOUNTER — Ambulatory Visit (INDEPENDENT_AMBULATORY_CARE_PROVIDER_SITE_OTHER): Payer: PPO

## 2023-07-05 DIAGNOSIS — E1142 Type 2 diabetes mellitus with diabetic polyneuropathy: Secondary | ICD-10-CM

## 2023-07-05 DIAGNOSIS — I5022 Chronic systolic (congestive) heart failure: Secondary | ICD-10-CM

## 2023-07-05 DIAGNOSIS — I10 Essential (primary) hypertension: Secondary | ICD-10-CM

## 2023-07-05 DIAGNOSIS — I6523 Occlusion and stenosis of bilateral carotid arteries: Secondary | ICD-10-CM | POA: Diagnosis not present

## 2023-07-05 DIAGNOSIS — I2510 Atherosclerotic heart disease of native coronary artery without angina pectoris: Secondary | ICD-10-CM | POA: Diagnosis not present

## 2023-07-05 DIAGNOSIS — E782 Mixed hyperlipidemia: Secondary | ICD-10-CM

## 2023-07-05 MED ORDER — TECHNETIUM TC 99M SESTAMIBI GENERIC - CARDIOLITE
10.30 | Freq: Once | INTRAVENOUS | Status: AC | PRN
Start: 2023-07-05 — End: 2023-07-05
  Administered 2023-07-05: 10.3 via INTRAVENOUS

## 2023-07-05 MED ORDER — TECHNETIUM TC 99M SESTAMIBI GENERIC - CARDIOLITE
32.4000 | Freq: Once | INTRAVENOUS | Status: AC | PRN
  Administered 2023-07-05: 32.4 via INTRAVENOUS

## 2023-07-08 ENCOUNTER — Other Ambulatory Visit: Payer: PPO

## 2023-07-09 DIAGNOSIS — D3131 Benign neoplasm of right choroid: Secondary | ICD-10-CM | POA: Diagnosis not present

## 2023-07-09 DIAGNOSIS — H26491 Other secondary cataract, right eye: Secondary | ICD-10-CM | POA: Diagnosis not present

## 2023-07-09 DIAGNOSIS — H04123 Dry eye syndrome of bilateral lacrimal glands: Secondary | ICD-10-CM | POA: Diagnosis not present

## 2023-07-09 DIAGNOSIS — E119 Type 2 diabetes mellitus without complications: Secondary | ICD-10-CM | POA: Diagnosis not present

## 2023-07-13 ENCOUNTER — Encounter: Payer: Self-pay | Admitting: Internal Medicine

## 2023-07-13 ENCOUNTER — Ambulatory Visit (INDEPENDENT_AMBULATORY_CARE_PROVIDER_SITE_OTHER): Payer: PPO | Admitting: Internal Medicine

## 2023-07-13 VITALS — BP 108/74 | HR 66 | Ht 66.0 in | Wt 218.4 lb

## 2023-07-13 DIAGNOSIS — E782 Mixed hyperlipidemia: Secondary | ICD-10-CM | POA: Diagnosis not present

## 2023-07-13 DIAGNOSIS — M5416 Radiculopathy, lumbar region: Secondary | ICD-10-CM

## 2023-07-13 DIAGNOSIS — E1165 Type 2 diabetes mellitus with hyperglycemia: Secondary | ICD-10-CM | POA: Diagnosis not present

## 2023-07-13 DIAGNOSIS — I10 Essential (primary) hypertension: Secondary | ICD-10-CM

## 2023-07-13 DIAGNOSIS — E559 Vitamin D deficiency, unspecified: Secondary | ICD-10-CM

## 2023-07-13 LAB — POCT CBG (FASTING - GLUCOSE)-MANUAL ENTRY: Glucose Fasting, POC: 145 mg/dL — AB (ref 70–99)

## 2023-07-13 MED ORDER — GLIMEPIRIDE 2 MG PO TABS
2.0000 mg | ORAL_TABLET | Freq: Two times a day (BID) | ORAL | 3 refills | Status: DC
Start: 2023-07-13 — End: 2024-07-03

## 2023-07-13 NOTE — Progress Notes (Signed)
Established Patient Office Visit  Subjective:  Patient ID: [name], male    DOB: Aug 24, 1946  Age: 77 y.o. MRN: [id number]  Chief Complaint  Patient presents with   Follow-up    3 month follow up    Patient comes in for his regular follow-up accompanied by his wife.  Previously he was treated for contact dermatitis with fiberglass and the rash had completely resolved however today he noticed that it came back on his left elbow but it is much smaller than before.  He used the cream again on it. He has been taking amitriptyline and gabapentin which helps with his back pain more than his neuropathy, however however he gets very sleepy and lightheaded in the mornings.  Patient has an appointment with his neurologist soon and they will discuss further management options. Fasting labs today.    No other concerns at this time.   Past Medical History:  Diagnosis Date   Back pain    Bladder cancer (HCC)    COVID-19 09/10/2020   Received Casirivimab/Irdevimab infusion on 09/11/20   Diabetes mellitus without complication (HCC)    HOH (hard of hearing)    Hypertension    Obesity    Renal cancer (HCC)    Renal cell cancer (HCC)    Throat cancer (HCC)    Tremor     Past Surgical History:  Procedure Laterality Date   APPENDECTOMY     BLADDER SURGERY     CAROTID PTA/STENT INTERVENTION Right 08/26/2021   Procedure: CAROTID PTA/STENT INTERVENTION;  Surgeon: Renford Dills, MD;  Location: [name];  Service: Cardiovascular;  Laterality: Right;   CATARACT EXTRACTION W/PHACO Right 01/21/2023   Procedure: CATARACT EXTRACTION PHACO AND INTRAOCULAR LENS PLACEMENT (IOC) RIGHT DIABETIC  4.89  00:45.6;  Surgeon: Estanislado Pandy, MD;  Location: [facility];  Service: Ophthalmology;  Laterality: Right;  Diabetic   CERVICAL SPINE SURGERY     CHOLECYSTECTOMY N/A 03/23/2018   Procedure: LAPAROSCOPIC CHOLECYSTECTOMY;  Surgeon: Carolan Shiver, MD;  Location: [name]  ORS;  Service: General;  Laterality: N/A;   COLONOSCOPY     COLONOSCOPY WITH PROPOFOL N/A 11/10/2017   Procedure: COLONOSCOPY WITH PROPOFOL;  Surgeon: Toledo, Boykin Nearing, MD;  Location: [name];  Service: Gastroenterology;  Laterality: N/A;   ESOPHAGOGASTRODUODENOSCOPY (EGD) WITH PROPOFOL N/A 11/10/2017   Procedure: ESOPHAGOGASTRODUODENOSCOPY (EGD) WITH PROPOFOL;  Surgeon: Toledo, Boykin Nearing, MD;  Location: [name] END[facility];  Service: Gastroenterology;  Laterality: N/A;   HERNIA REPAIR     left od surgery     MICROLARYNGOSCOPY N/A 11/05/2020   Procedure: MICROLARYNGOSCOPY WITH BIOPSIES;  Surgeon: Geanie Logan, MD;  Location: [facility];  Service: ENT;  Laterality: N/A;   NEPHRECTOMY     right kidney nephrectomy   rt elbow surgery     THROAT SURGERY  1990   for cancer removal at [facility]    Social History   Socioeconomic History   Marital status: Married    Spouse name: Not on file   Number of children: Not on file   Years of education: Not on file   Highest education level: Not on file  Occupational History   Not on file  Tobacco Use   Smoking status: Former    Current packs/day: 0.00    Types: Cigarettes    Quit date: 1991    Years since quitting: 33.5   Smokeless tobacco: Never  Vaping Use   Vaping status: Never Used  Substance and Sexual Activity  Alcohol use: Not Currently    Comment: Occasional beer drinker   Drug use: No   Sexual activity: Yes  Other Topics Concern   Not on file  Social History Narrative   Lives at home with wife, active and independent at baseline   Social Determinants of Health   Financial Resource Strain: Not on file  Food Insecurity: No Food Insecurity (05/10/2023)   Hunger Vital Sign    Worried About Running Out of Food in the Last Year: Never true    Ran Out of Food in the Last Year: Never true  Transportation Needs: No Transportation Needs (05/10/2023)   PRAPARE - Administrator, Civil Service (Medical):  No    Lack of Transportation (Non-Medical): No  Physical Activity: Not on file  Stress: Not on file  Social Connections: Not on file  Intimate Partner Violence: Not At Risk (05/10/2023)   Humiliation, Afraid, Rape, and Kick questionnaire    Fear of Current or Ex-Partner: No    Emotionally Abused: No    Physically Abused: No    Sexually Abused: No    Family History  Problem Relation Age of Onset   Diabetes Brother    Hypertension Brother    CAD Brother    CAD Brother     Allergies  Allergen Reactions   Metformin Diarrhea    Review of Systems  Constitutional: Negative.  Negative for diaphoresis, fever, malaise/fatigue and weight loss.  HENT: Negative.  Negative for ear pain, sinus pain and sore throat.   Eyes: Negative.   Respiratory: Negative.  Negative for cough and shortness of breath.   Cardiovascular: Negative.  Negative for chest pain, palpitations and leg swelling.  Gastrointestinal: Negative.  Negative for abdominal pain, constipation, diarrhea, heartburn, nausea and vomiting.  Genitourinary: Negative.  Negative for dysuria and flank pain.  Musculoskeletal: Negative.  Negative for joint pain and myalgias.  Skin:  Positive for rash.  Neurological:  Positive for tingling and tremors. Negative for dizziness, sensory change, focal weakness, seizures, weakness and headaches.  Endo/Heme/Allergies: Negative.   Psychiatric/Behavioral: Negative.  Negative for depression and suicidal ideas. The patient is not nervous/anxious.        Objective:   BP 108/74   Pulse 66   Ht 5\' 6"  (1.676 m)   Wt 218 lb 6.4 oz (99.1 kg)   SpO2 96%   BMI 35.25 kg/m   Vitals:   07/13/23 0946  BP: 108/74  Pulse: 66  Height: 5\' 6"  (1.676 m)  Weight: 218 lb 6.4 oz (99.1 kg)  SpO2: 96%  BMI (Calculated): 35.27    Physical Exam Vitals and nursing note reviewed.  Constitutional:      Appearance: Normal appearance.  HENT:     Head: Normocephalic and atraumatic.     Nose: Nose normal.      Mouth/Throat:     Mouth: Mucous membranes are moist.     Pharynx: Oropharynx is clear.  Eyes:     Conjunctiva/sclera: Conjunctivae normal.     Pupils: Pupils are equal, round, and reactive to light.  Cardiovascular:     Rate and Rhythm: Normal rate and regular rhythm.     Pulses: Normal pulses.     Heart sounds: Normal heart sounds. No murmur heard. Pulmonary:     Effort: Pulmonary effort is normal. No respiratory distress.     Breath sounds: Normal breath sounds.  Abdominal:     General: Bowel sounds are normal. There is no distension.  Palpations: Abdomen is soft. There is no mass.     Tenderness: There is no right CVA tenderness or left CVA tenderness.  Musculoskeletal:        General: Normal range of motion.     Cervical back: Normal range of motion.     Right lower leg: No edema.     Left lower leg: No edema.  Skin:    General: Skin is warm and dry.  Neurological:     General: No focal deficit present.     Mental Status: He is alert and oriented to person, place, and time.  Psychiatric:        Mood and Affect: Mood normal.        Behavior: Behavior normal.        Judgment: Judgment normal.    Results for orders placed or performed in visit on 07/13/23  POCT CBG (Fasting - Glucose)  Result Value Ref Range   Glucose Fasting, POC 145 (A) 70 - 99 mg/dL        Assessment & Plan:  Patient advised to continue taking all his medications as such we will discuss with neurologist for further treatment options for his neuropathy. Problem List Items Addressed This Visit     Essential hypertension, benign   Relevant Orders   CBC With Differential   CMP14+EGFR   Hyperlipidemia   Relevant Orders   Lipid Panel w/o Chol/HDL Ratio   Type 2 diabetes mellitus with hyperglycemia, without long-term current use of insulin (HCC) - Primary   Relevant Medications   glimepiride (AMARYL) 2 MG tablet   Other Relevant Orders   POCT CBG (Fasting - Glucose) (Completed)    Hemoglobin A1c   Vitamin D deficiency   Relevant Orders   Vitamin D (25 hydroxy)   Lumbar radiculopathy, chronic    Return in about 3 months (around 10/13/2023).   Total time spent: 30 minutes  Margaretann Loveless, MD  07/13/2023   This document may have been prepared by [facility] Voice Recognition software and as such may include unintentional dictation errors.

## 2023-07-14 DIAGNOSIS — K219 Gastro-esophageal reflux disease without esophagitis: Secondary | ICD-10-CM | POA: Diagnosis not present

## 2023-07-14 DIAGNOSIS — Z86010 Personal history of colonic polyps: Secondary | ICD-10-CM | POA: Diagnosis not present

## 2023-07-14 DIAGNOSIS — K5909 Other constipation: Secondary | ICD-10-CM | POA: Diagnosis not present

## 2023-07-14 DIAGNOSIS — R1310 Dysphagia, unspecified: Secondary | ICD-10-CM | POA: Diagnosis not present

## 2023-07-14 LAB — CMP14+EGFR
ALT: 16 IU/L (ref 0–44)
AST: 15 IU/L (ref 0–40)
Albumin: 4.1 g/dL (ref 3.8–4.8)
Alkaline Phosphatase: 58 IU/L (ref 44–121)
BUN/Creatinine Ratio: 21 (ref 10–24)
BUN: 25 mg/dL (ref 8–27)
Bilirubin Total: 0.4 mg/dL (ref 0.0–1.2)
CO2: 26 mmol/L (ref 20–29)
Calcium: 11.7 mg/dL — ABNORMAL HIGH (ref 8.6–10.2)
Chloride: 102 mmol/L (ref 96–106)
Creatinine, Ser: 1.2 mg/dL (ref 0.76–1.27)
Globulin, Total: 2.4 g/dL (ref 1.5–4.5)
Glucose: 124 mg/dL — ABNORMAL HIGH (ref 70–99)
Potassium: 4.1 mmol/L (ref 3.5–5.2)
Sodium: 141 mmol/L (ref 134–144)
Total Protein: 6.5 g/dL (ref 6.0–8.5)
eGFR: 63 mL/min/{1.73_m2} (ref >59)

## 2023-07-14 LAB — HEMOGLOBIN A1C
Est. average glucose Bld gHb Est-mCnc: 146 mg/dL
Hgb A1c MFr Bld: 6.7 % — ABNORMAL HIGH (ref 4.8–5.6)

## 2023-07-14 LAB — CBC WITH DIFFERENTIAL
Basophils Absolute: 0.1 10*3/uL (ref 0.0–0.2)
Basos: 1 %
EOS (ABSOLUTE): 0.5 10*3/uL — ABNORMAL HIGH (ref 0.0–0.4)
Eos: 6 %
Hematocrit: 46.6 % (ref 37.5–51.0)
Hemoglobin: 15.1 g/dL (ref 13.0–17.7)
Immature Grans (Abs): 0 10*3/uL (ref 0.0–0.1)
Immature Granulocytes: 0 %
Lymphocytes Absolute: 1.2 10*3/uL (ref 0.7–3.1)
Lymphs: 14 %
MCH: 25.9 pg — ABNORMAL LOW (ref 26.6–33.0)
MCHC: 32.4 g/dL (ref 31.5–35.7)
MCV: 80 fL (ref 79–97)
Monocytes Absolute: 0.6 10*3/uL (ref 0.1–0.9)
Monocytes: 6 %
Neutrophils Absolute: 6.6 10*3/uL (ref 1.4–7.0)
Neutrophils: 73 %
RBC: 5.83 x10E6/uL — ABNORMAL HIGH (ref 4.14–5.80)
RDW: 15.8 % — ABNORMAL HIGH (ref 11.6–15.4)
WBC: 8.9 10*3/uL (ref 3.4–10.8)

## 2023-07-14 LAB — VITAMIN D 25 HYDROXY (VIT D DEFICIENCY, FRACTURES): Vit D, 25-Hydroxy: 35.1 ng/mL (ref 30.0–100.0)

## 2023-07-14 LAB — LIPID PANEL W/O CHOL/HDL RATIO
Cholesterol, Total: 141 mg/dL (ref 100–199)
HDL: 39 mg/dL — ABNORMAL LOW (ref >39)
LDL Chol Calc (NIH): 76 mg/dL (ref 0–99)
Triglycerides: 148 mg/dL (ref 0–149)
VLDL Cholesterol Cal: 26 mg/dL (ref 5–40)

## 2023-07-22 ENCOUNTER — Ambulatory Visit (INDEPENDENT_AMBULATORY_CARE_PROVIDER_SITE_OTHER): Payer: PPO | Admitting: Cardiology

## 2023-07-22 ENCOUNTER — Encounter: Payer: Self-pay | Admitting: Cardiology

## 2023-07-22 ENCOUNTER — Encounter: Payer: Self-pay | Admitting: Internal Medicine

## 2023-07-22 VITALS — BP 148/76 | HR 64 | Ht 72.0 in | Wt 219.6 lb

## 2023-07-22 DIAGNOSIS — I2510 Atherosclerotic heart disease of native coronary artery without angina pectoris: Secondary | ICD-10-CM | POA: Diagnosis not present

## 2023-07-22 DIAGNOSIS — E1165 Type 2 diabetes mellitus with hyperglycemia: Secondary | ICD-10-CM | POA: Diagnosis not present

## 2023-07-22 DIAGNOSIS — E782 Mixed hyperlipidemia: Secondary | ICD-10-CM

## 2023-07-22 DIAGNOSIS — I10 Essential (primary) hypertension: Secondary | ICD-10-CM

## 2023-07-22 LAB — POCT CBG (FASTING - GLUCOSE)-MANUAL ENTRY: Glucose Fasting, POC: 118 mg/dL — AB (ref 70–99)

## 2023-07-22 NOTE — Progress Notes (Signed)
Cardiology Office Note   Date:  07/22/2023   ID:  [name], DOB 14-Feb-1946, MRN [id number]  PCP:  Margaretann Loveless, MD  Cardiologist:  Marisue Ivan, NP      History of Present Illness: [name] is a 77 y.o. male who presents for  Chief Complaint  Patient presents with   Follow-up    Patient in office for one month follow up, discuss results of stress test. Denies chest pain, shortness of breath, dizziness.      Past Medical History:  Diagnosis Date   Back pain    Bladder cancer (HCC)    COVID-19 09/10/2020   Received Casirivimab/Irdevimab infusion on 09/11/20   Diabetes mellitus without complication (HCC)    HOH (hard of hearing)    Hypertension    Obesity    Renal cancer (HCC)    Renal cell cancer (HCC)    Throat cancer (HCC)    Tremor      Past Surgical History:  Procedure Laterality Date   APPENDECTOMY     BLADDER SURGERY     CAROTID PTA/STENT INTERVENTION Right 08/26/2021   Procedure: CAROTID PTA/STENT INTERVENTION;  Surgeon: Renford Dills, MD;  Location: [name];  Service: Cardiovascular;  Laterality: Right;   CATARACT EXTRACTION W/PHACO Right 01/21/2023   Procedure: CATARACT EXTRACTION PHACO AND INTRAOCULAR LENS PLACEMENT (IOC) RIGHT DIABETIC  4.89  00:45.6;  Surgeon: Estanislado Pandy, MD;  Location: [facility];  Service: Ophthalmology;  Laterality: Right;  Diabetic   CERVICAL SPINE SURGERY     CHOLECYSTECTOMY N/A 03/23/2018   Procedure: LAPAROSCOPIC CHOLECYSTECTOMY;  Surgeon: Carolan Shiver, MD;  Location: [name];  Service: General;  Laterality: N/A;   COLONOSCOPY     COLONOSCOPY WITH PROPOFOL N/A 11/10/2017   Procedure: COLONOSCOPY WITH PROPOFOL;  Surgeon: Toledo, Boykin Nearing, MD;  Location: [name];  Service: Gastroenterology;  Laterality: N/A;   ESOPHAGOGASTRODUODENOSCOPY (EGD) WITH PROPOFOL N/A 11/10/2017   Procedure: ESOPHAGOGASTRODUODENOSCOPY (EGD) WITH PROPOFOL;  Surgeon: Toledo, Boykin Nearing, MD;  Location: [name];  Service: Gastroenterology;  Laterality: N/A;   HERNIA REPAIR     left od surgery     MICROLARYNGOSCOPY N/A 11/05/2020   Procedure: MICROLARYNGOSCOPY WITH BIOPSIES;  Surgeon: Geanie Logan, MD;  Location: [facility];  Service: ENT;  Laterality: N/A;   NEPHRECTOMY     right kidney nephrectomy   rt elbow surgery     THROAT SURGERY  1990   for cancer removal at [facility]     Current Outpatient Medications  Medication Sig Dispense Refill   acetaminophen (TYLENOL) 650 MG CR tablet Take 1,300 mg by mouth every 8 (eight) hours as needed for pain.     amitriptyline (ELAVIL) 25 MG tablet Take 2 tablets (50 mg total) by mouth at bedtime. 60 tablet 4   aspirin EC 81 MG tablet Take 81 mg by mouth daily. Swallow whole.     carvedilol (COREG) 6.25 MG tablet Take 1 tablet by mouth twice daily 180 tablet 0   chlorthalidone (HYGROTON) 25 MG tablet Take 1 tablet by mouth once daily 90 tablet 0   clopidogrel (PLAVIX) 75 MG tablet Take 1 tablet by mouth once daily 90 tablet 0   empagliflozin (JARDIANCE) 25 MG TABS tablet Take 25 mg by mouth daily.     fluticasone (FLONASE) 50 MCG/ACT nasal spray Place 2 sprays into both nostrils daily as needed for allergies or rhinitis.     glimepiride (AMARYL) 2 MG tablet  Take 1 tablet (2 mg total) by mouth 2 (two) times daily. 180 tablet 3   ibuprofen (ADVIL) 200 MG tablet Take 200 mg by mouth as needed.     insulin NPH-regular Human (NOVOLIN 70/30) (70-30) 100 UNIT/ML injection Inject 45 Units into the skin 2 (two) times daily with a meal.      primidone (MYSOLINE) 50 MG tablet Take 50 mg by mouth at bedtime.     rosuvastatin (CRESTOR) 40 MG tablet Take 40 mg by mouth daily.     tamsulosin (FLOMAX) 0.4 MG CAPS capsule TAKE 1 CAPSULE BY MOUTH ONCE DAILY AFTER BREAKFAST 90 capsule 0   triamcinolone (KENALOG) 0.025 % ointment Apply 1 Application topically 2 (two) times daily. 80 g 1   No current facility-administered  medications for this visit.    Allergies:   Metformin    Social History:   reports that he quit smoking about 33 years ago. His smoking use included cigarettes. He has never used smokeless tobacco. He reports that he does not currently use alcohol. He reports that he does not use drugs.   Family History:  family history includes CAD in his brother and brother; Diabetes in his brother; Hypertension in his brother.    ROS:     Review of Systems  Constitutional: Negative.   HENT: Negative.    Eyes: Negative.   Respiratory: Negative.    Cardiovascular: Negative.   Gastrointestinal: Negative.   Genitourinary: Negative.   Musculoskeletal: Negative.   Skin: Negative.   Neurological: Negative.   Endo/Heme/Allergies: Negative.   Psychiatric/Behavioral: Negative.    All other systems reviewed and are negative.     All other systems are reviewed and negative.    PHYSICAL EXAM: VS:  BP (!) 148/76   Pulse 64   Ht 6' (1.829 m)   Wt 219 lb 9.6 oz (99.6 kg)   SpO2 97%   BMI 29.78 kg/m  , BMI Body mass index is 29.78 kg/m. Last weight:  Wt Readings from Last 3 Encounters:  07/22/23 219 lb 9.6 oz (99.6 kg)  07/13/23 218 lb 6.4 oz (99.1 kg)  06/25/23 221 lb (100.2 kg)     Physical Exam Vitals and nursing note reviewed.  Constitutional:      Appearance: Normal appearance. He is normal weight.  HENT:     Head: Normocephalic and atraumatic.     Nose: Nose normal.     Mouth/Throat:     Mouth: Mucous membranes are moist.     Pharynx: Oropharynx is clear.  Eyes:     Extraocular Movements: Extraocular movements intact.     Conjunctiva/sclera: Conjunctivae normal.     Pupils: Pupils are equal, round, and reactive to light.  Cardiovascular:     Rate and Rhythm: Normal rate and regular rhythm.     Pulses: Normal pulses.     Heart sounds: Normal heart sounds.  Pulmonary:     Effort: Pulmonary effort is normal.     Breath sounds: Normal breath sounds.  Abdominal:     General:  Abdomen is flat. Bowel sounds are normal.     Palpations: Abdomen is soft.  Musculoskeletal:        General: Normal range of motion.     Cervical back: Normal range of motion.  Skin:    General: Skin is warm and dry.  Neurological:     General: No focal deficit present.     Mental Status: He is alert and oriented to person, place, and time. Mental  status is at baseline.  Psychiatric:        Mood and Affect: Mood normal.        Behavior: Behavior normal.        Thought Content: Thought content normal.        Judgment: Judgment normal.      EKG: none today  Recent Labs: 05/10/2023: Platelets 146 07/13/2023: ALT 16; BUN 25; Creatinine, Ser 1.20; Hemoglobin 15.1; Potassium 4.1; Sodium 141    Lipid Panel    Component Value Date/Time   CHOL 141 07/13/2023 1036   TRIG 148 07/13/2023 1036   HDL 39 (L) 07/13/2023 1036   LDLCALC 76 07/13/2023 1036      Other studies Reviewed: stress test 06/2023    ASSESSMENT AND PLAN:    ICD-10-CM   1. Type 2 diabetes mellitus with hyperglycemia, without long-term current use of insulin (HCC)  E11.65 POCT CBG (Fasting - Glucose)    2. Essential hypertension, benign  I10     3. Mixed hyperlipidemia  E78.2     4. Coronary artery disease involving native coronary artery of native heart without angina pectoris  I25.10        Problem List Items Addressed This Visit       Cardiovascular and Mediastinum   Essential hypertension, benign   Coronary artery disease involving native coronary artery of native heart without angina pectoris    Denies chest pain. Stress test shows old MI.         Endocrine   Type 2 diabetes mellitus with hyperglycemia, without long-term current use of insulin (HCC) - Primary   Relevant Orders   POCT CBG (Fasting - Glucose) (Completed)     Other   Hyperlipidemia     Disposition:   Return in about 4 months (around 11/22/2023).    Total time spent: 30 minutes  Signed,  Google, NP  07/22/2023  10:36 AM    [facility]

## 2023-07-22 NOTE — Assessment & Plan Note (Signed)
Denies chest pain. Stress test shows old MI.

## 2023-07-27 DIAGNOSIS — H26491 Other secondary cataract, right eye: Secondary | ICD-10-CM | POA: Diagnosis not present

## 2023-07-30 ENCOUNTER — Other Ambulatory Visit: Payer: Self-pay | Admitting: Cardiovascular Disease

## 2023-07-31 ENCOUNTER — Other Ambulatory Visit: Payer: Self-pay | Admitting: Internal Medicine

## 2023-07-31 ENCOUNTER — Other Ambulatory Visit: Payer: Self-pay | Admitting: Cardiovascular Disease

## 2023-08-06 DIAGNOSIS — Z9841 Cataract extraction status, right eye: Secondary | ICD-10-CM | POA: Diagnosis not present

## 2023-08-14 ENCOUNTER — Other Ambulatory Visit (INDEPENDENT_AMBULATORY_CARE_PROVIDER_SITE_OTHER): Payer: Self-pay | Admitting: Nurse Practitioner

## 2023-08-17 ENCOUNTER — Other Ambulatory Visit: Payer: Self-pay | Admitting: Internal Medicine

## 2023-08-17 MED ORDER — EMPAGLIFLOZIN 25 MG PO TABS: 25.0000 mg | ORAL_TABLET | Freq: Every day | ORAL | 2 refills | Status: DC

## 2023-09-02 ENCOUNTER — Telehealth: Payer: Self-pay | Admitting: Internal Medicine

## 2023-09-02 ENCOUNTER — Other Ambulatory Visit: Payer: Self-pay | Admitting: Internal Medicine

## 2023-09-02 DIAGNOSIS — E1165 Type 2 diabetes mellitus with hyperglycemia: Secondary | ICD-10-CM

## 2023-09-02 NOTE — Telephone Encounter (Signed)
[name] called needing an order for therapeutic diabetic shoes. Insurance has approved these. We need to print this for her so they can take it with them to medical supply store.

## 2023-09-20 DIAGNOSIS — E1136 Type 2 diabetes mellitus with diabetic cataract: Secondary | ICD-10-CM | POA: Diagnosis not present

## 2023-09-20 DIAGNOSIS — N400 Enlarged prostate without lower urinary tract symptoms: Secondary | ICD-10-CM | POA: Diagnosis not present

## 2023-09-20 DIAGNOSIS — E1165 Type 2 diabetes mellitus with hyperglycemia: Secondary | ICD-10-CM | POA: Diagnosis not present

## 2023-09-20 DIAGNOSIS — Z7982 Long term (current) use of aspirin: Secondary | ICD-10-CM | POA: Diagnosis not present

## 2023-09-20 DIAGNOSIS — G8929 Other chronic pain: Secondary | ICD-10-CM | POA: Diagnosis not present

## 2023-09-20 DIAGNOSIS — Z7902 Long term (current) use of antithrombotics/antiplatelets: Secondary | ICD-10-CM | POA: Diagnosis not present

## 2023-09-20 DIAGNOSIS — E1142 Type 2 diabetes mellitus with diabetic polyneuropathy: Secondary | ICD-10-CM | POA: Diagnosis not present

## 2023-09-20 DIAGNOSIS — I10 Essential (primary) hypertension: Secondary | ICD-10-CM | POA: Diagnosis not present

## 2023-09-25 ENCOUNTER — Other Ambulatory Visit: Payer: Self-pay | Admitting: Cardiovascular Disease

## 2023-09-25 DIAGNOSIS — I10 Essential (primary) hypertension: Secondary | ICD-10-CM

## 2023-09-28 ENCOUNTER — Other Ambulatory Visit: Payer: Self-pay | Admitting: Internal Medicine

## 2023-10-05 ENCOUNTER — Ambulatory Visit: Admit: 2023-10-05 | Payer: PPO

## 2023-10-05 SURGERY — COLONOSCOPY WITH PROPOFOL
Anesthesia: General

## 2023-10-14 ENCOUNTER — Ambulatory Visit: Payer: PPO | Admitting: Internal Medicine

## 2023-10-14 ENCOUNTER — Encounter: Payer: Self-pay | Admitting: Internal Medicine

## 2023-10-14 VITALS — BP 136/80 | HR 67 | Ht 72.0 in | Wt 218.0 lb

## 2023-10-14 DIAGNOSIS — E1159 Type 2 diabetes mellitus with other circulatory complications: Secondary | ICD-10-CM

## 2023-10-14 DIAGNOSIS — I5022 Chronic systolic (congestive) heart failure: Secondary | ICD-10-CM

## 2023-10-14 DIAGNOSIS — I152 Hypertension secondary to endocrine disorders: Secondary | ICD-10-CM

## 2023-10-14 DIAGNOSIS — Z23 Encounter for immunization: Secondary | ICD-10-CM | POA: Diagnosis not present

## 2023-10-14 DIAGNOSIS — I10 Essential (primary) hypertension: Secondary | ICD-10-CM

## 2023-10-14 DIAGNOSIS — E559 Vitamin D deficiency, unspecified: Secondary | ICD-10-CM | POA: Diagnosis not present

## 2023-10-14 DIAGNOSIS — I2510 Atherosclerotic heart disease of native coronary artery without angina pectoris: Secondary | ICD-10-CM | POA: Diagnosis not present

## 2023-10-14 DIAGNOSIS — E782 Mixed hyperlipidemia: Secondary | ICD-10-CM

## 2023-10-14 NOTE — Progress Notes (Signed)
Established Patient Office Visit  Subjective:  Patient ID: [name], male    DOB: Oct 28, 1946  Age: 77 y.o. MRN: [id number]  Chief Complaint  Patient presents with   Follow-up    3 month follow up    Patient comes in for his follow-up today.  He has been doing well for the last 3 months and has no new complaints. Patient will return fasting for his labs and flu shot.    No other concerns at this time.   Past Medical History:  Diagnosis Date   Back pain    Bladder cancer (HCC)    COVID-19 09/10/2020   Received Casirivimab/Irdevimab infusion on 09/11/20   Diabetes mellitus without complication (HCC)    HOH (hard of hearing)    Hypertension    Obesity    Renal cancer (HCC)    Renal cell cancer (HCC)    Throat cancer (HCC)    Tremor     Past Surgical History:  Procedure Laterality Date   APPENDECTOMY     BLADDER SURGERY     CAROTID PTA/STENT INTERVENTION Right 08/26/2021   Procedure: CAROTID PTA/STENT INTERVENTION;  Surgeon: Renford Dills, MD;  Location: [name];  Service: Cardiovascular;  Laterality: Right;   CATARACT EXTRACTION W/PHACO Right 01/21/2023   Procedure: CATARACT EXTRACTION PHACO AND INTRAOCULAR LENS PLACEMENT (IOC) RIGHT DIABETIC  4.89  00:45.6;  Surgeon: Estanislado Pandy, MD;  Location: [facility];  Service: Ophthalmology;  Laterality: Right;  Diabetic   CERVICAL SPINE SURGERY     CHOLECYSTECTOMY N/A 03/23/2018   Procedure: LAPAROSCOPIC CHOLECYSTECTOMY;  Surgeon: Carolan Shiver, MD;  Location: [name];  Service: General;  Laterality: N/A;   COLONOSCOPY     COLONOSCOPY WITH PROPOFOL N/A 11/10/2017   Procedure: COLONOSCOPY WITH PROPOFOL;  Surgeon: Toledo, Boykin Nearing, MD;  Location: [name];  Service: Gastroenterology;  Laterality: N/A;   ESOPHAGOGASTRODUODENOSCOPY (EGD) WITH PROPOFOL N/A 11/10/2017   Procedure: ESOPHAGOGASTRODUODENOSCOPY (EGD) WITH PROPOFOL;  Surgeon: Toledo, Boykin Nearing, MD;  Location: [name]  ENDOSCOPY;  Service: Gastroenterology;  Laterality: N/A;   HERNIA REPAIR     left od surgery     MICROLARYNGOSCOPY N/A 11/05/2020   Procedure: MICROLARYNGOSCOPY WITH BIOPSIES;  Surgeon: Geanie Logan, MD;  Location: [facility];  Service: ENT;  Laterality: N/A;   NEPHRECTOMY     right kidney nephrectomy   rt elbow surgery     THROAT SURGERY  1990   for cancer removal at [facility]    Social History   Socioeconomic History   Marital status: Married    Spouse name: Not on file   Number of children: Not on file   Years of education: Not on file   Highest education level: Not on file  Occupational History   Not on file  Tobacco Use   Smoking status: Former    Current packs/day: 0.00    Types: Cigarettes    Quit date: 1991    Years since quitting: 33.8   Smokeless tobacco: Never  Vaping Use   Vaping status: Never Used  Substance and Sexual Activity   Alcohol use: Not Currently    Comment: Occasional beer drinker   Drug use: No   Sexual activity: Yes  Other Topics Concern   Not on file  Social History Narrative   Lives at home with wife, active and independent at baseline   Social Determinants of Health   Financial Resource Strain: Not on file  Food Insecurity: No Food Insecurity (  05/10/2023)   Hunger Vital Sign    Worried About Running Out of Food in the Last Year: Never true    Ran Out of Food in the Last Year: Never true  Transportation Needs: No Transportation Needs (05/10/2023)   PRAPARE - Administrator, Civil Service (Medical): No    Lack of Transportation (Non-Medical): No  Physical Activity: Not on file  Stress: Not on file  Social Connections: Not on file  Intimate Partner Violence: Not At Risk (05/10/2023)   Humiliation, Afraid, Rape, and Kick questionnaire    Fear of Current or Ex-Partner: No    Emotionally Abused: No    Physically Abused: No    Sexually Abused: No    Family History  Problem Relation Age of Onset   Diabetes  Brother    Hypertension Brother    CAD Brother    CAD Brother     Allergies  Allergen Reactions   Metformin Diarrhea    Review of Systems  Constitutional: Negative.  Negative for chills, diaphoresis, fever, malaise/fatigue and weight loss.  HENT:  Negative for ear pain and sore throat.   Eyes: Negative.   Respiratory: Negative.  Negative for cough and shortness of breath.   Cardiovascular: Negative.  Negative for chest pain, palpitations and leg swelling.  Gastrointestinal: Negative.  Negative for abdominal pain, constipation, diarrhea, heartburn, nausea and vomiting.  Genitourinary: Negative.  Negative for dysuria and flank pain.  Musculoskeletal: Negative.  Negative for joint pain and myalgias.  Skin: Negative.   Neurological: Negative.  Negative for dizziness, tingling, tremors, sensory change and headaches.  Endo/Heme/Allergies: Negative.   Psychiatric/Behavioral: Negative.  Negative for depression and suicidal ideas. The patient is not nervous/anxious.        Objective:   BP 136/80   Pulse 67   Ht 6' (1.829 m)   Wt 218 lb (98.9 kg)   SpO2 98%   BMI 29.57 kg/m   Vitals:   10/14/23 1041  BP: 136/80  Pulse: 67  Height: 6' (1.829 m)  Weight: 218 lb (98.9 kg)  SpO2: 98%  BMI (Calculated): 29.56    Physical Exam Vitals and nursing note reviewed.  Constitutional:      General: He is not in acute distress.    Appearance: Normal appearance.  HENT:     Head: Normocephalic and atraumatic.     Nose: Nose normal.     Mouth/Throat:     Mouth: Mucous membranes are moist.     Pharynx: Oropharynx is clear.  Eyes:     Conjunctiva/sclera: Conjunctivae normal.     Pupils: Pupils are equal, round, and reactive to light.  Cardiovascular:     Rate and Rhythm: Normal rate and regular rhythm.     Pulses: Normal pulses.     Heart sounds: Normal heart sounds.  Pulmonary:     Effort: Pulmonary effort is normal.     Breath sounds: Normal breath sounds. No wheezing, rhonchi  or rales.  Abdominal:     General: Bowel sounds are normal.     Palpations: Abdomen is soft. There is no mass.     Tenderness: There is no abdominal tenderness. There is no right CVA tenderness, left CVA tenderness, guarding or rebound.     Hernia: No hernia is present.  Musculoskeletal:        General: Normal range of motion.     Cervical back: Normal range of motion.     Right lower leg: No edema.  Left lower leg: No edema.  Skin:    General: Skin is warm and dry.     Findings: No rash.  Neurological:     General: No focal deficit present.     Mental Status: He is alert and oriented to person, place, and time.  Psychiatric:        Mood and Affect: Mood normal.        Behavior: Behavior normal.        Judgment: Judgment normal.      No results found for any visits on 10/14/23.  Recent Results (from the past 2160 hour(s))  POCT CBG (Fasting - Glucose)     Status: Abnormal   Collection Time: 07/22/23 10:08 AM  Result Value Ref Range   Glucose Fasting, POC 118 (A) 70 - 99 mg/dL      Assessment & Plan:  Patient advised to continue taking all his medications. He will return fasting for blood work. Problem List Items Addressed This Visit     Essential hypertension, benign   Hyperlipidemia   Coronary artery disease involving native coronary artery of native heart without angina pectoris   Hypertension associated with diabetes (HCC) - Primary   Other Visit Diagnoses     Need for immunization against influenza       Vitamin D insufficiency       Chronic systolic congestive heart failure, NYHA class 1 (HCC)           Return in about 3 months (around 01/14/2024).   Total time spent: 30 minutes  Margaretann Loveless, MD  10/14/2023   This document may have been prepared by [facility] Voice Recognition software and as such may include unintentional dictation errors.

## 2023-10-18 ENCOUNTER — Other Ambulatory Visit: Payer: Self-pay | Admitting: Internal Medicine

## 2023-10-19 ENCOUNTER — Other Ambulatory Visit (INDEPENDENT_AMBULATORY_CARE_PROVIDER_SITE_OTHER): Payer: PPO

## 2023-10-19 ENCOUNTER — Ambulatory Visit: Payer: PPO

## 2023-10-19 DIAGNOSIS — I152 Hypertension secondary to endocrine disorders: Secondary | ICD-10-CM

## 2023-10-19 DIAGNOSIS — I10 Essential (primary) hypertension: Secondary | ICD-10-CM

## 2023-10-19 DIAGNOSIS — E1159 Type 2 diabetes mellitus with other circulatory complications: Secondary | ICD-10-CM | POA: Diagnosis not present

## 2023-10-19 DIAGNOSIS — E782 Mixed hyperlipidemia: Secondary | ICD-10-CM

## 2023-10-19 DIAGNOSIS — E1165 Type 2 diabetes mellitus with hyperglycemia: Secondary | ICD-10-CM | POA: Diagnosis not present

## 2023-10-19 DIAGNOSIS — Z23 Encounter for immunization: Secondary | ICD-10-CM | POA: Diagnosis not present

## 2023-10-20 LAB — CBC WITH DIFFERENTIAL/PLATELET
Basophils Absolute: 0.1 10*3/uL (ref 0.0–0.2)
Basos: 1 %
EOS (ABSOLUTE): 0.6 10*3/uL — ABNORMAL HIGH (ref 0.0–0.4)
Eos: 7 %
Hematocrit: 48.1 % (ref 37.5–51.0)
Hemoglobin: 14.8 g/dL (ref 13.0–17.7)
Immature Grans (Abs): 0 10*3/uL (ref 0.0–0.1)
Immature Granulocytes: 0 %
Lymphocytes Absolute: 1.2 10*3/uL (ref 0.7–3.1)
Lymphs: 13 %
MCH: 25.7 pg — ABNORMAL LOW (ref 26.6–33.0)
MCHC: 30.8 g/dL — ABNORMAL LOW (ref 31.5–35.7)
MCV: 84 fL (ref 79–97)
Monocytes Absolute: 0.6 10*3/uL (ref 0.1–0.9)
Monocytes: 7 %
Neutrophils Absolute: 6.3 10*3/uL (ref 1.4–7.0)
Neutrophils: 72 %
Platelets: 153 10*3/uL (ref 150–450)
RBC: 5.75 x10E6/uL (ref 4.14–5.80)
RDW: 15 % (ref 11.6–15.4)
WBC: 8.8 10*3/uL (ref 3.4–10.8)

## 2023-10-20 LAB — LIPID PANEL
Chol/HDL Ratio: 3.4 ratio (ref 0.0–5.0)
Cholesterol, Total: 128 mg/dL (ref 100–199)
HDL: 38 mg/dL — ABNORMAL LOW (ref >39)
LDL Chol Calc (NIH): 69 mg/dL (ref 0–99)
Triglycerides: 116 mg/dL (ref 0–149)
VLDL Cholesterol Cal: 21 mg/dL (ref 5–40)

## 2023-10-20 LAB — CMP14+EGFR
ALT: 12 [IU]/L (ref 0–44)
AST: 18 [IU]/L (ref 0–40)
Albumin: 4.3 g/dL (ref 3.8–4.8)
Alkaline Phosphatase: 68 [IU]/L (ref 44–121)
BUN/Creatinine Ratio: 16 (ref 10–24)
BUN: 17 mg/dL (ref 8–27)
Bilirubin Total: 0.5 mg/dL (ref 0.0–1.2)
CO2: 26 mmol/L (ref 20–29)
Calcium: 11.1 mg/dL — ABNORMAL HIGH (ref 8.6–10.2)
Chloride: 100 mmol/L (ref 96–106)
Creatinine, Ser: 1.07 mg/dL (ref 0.76–1.27)
Globulin, Total: 2.3 g/dL (ref 1.5–4.5)
Glucose: 121 mg/dL — ABNORMAL HIGH (ref 70–99)
Potassium: 4.6 mmol/L (ref 3.5–5.2)
Sodium: 140 mmol/L (ref 134–144)
Total Protein: 6.6 g/dL (ref 6.0–8.5)
eGFR: 72 mL/min/{1.73_m2} (ref >59)

## 2023-10-20 LAB — HEMOGLOBIN A1C
Est. average glucose Bld gHb Est-mCnc: 151 mg/dL
Hgb A1c MFr Bld: 6.9 % — ABNORMAL HIGH (ref 4.8–5.6)

## 2023-10-24 ENCOUNTER — Other Ambulatory Visit: Payer: Self-pay | Admitting: Internal Medicine

## 2023-10-28 ENCOUNTER — Other Ambulatory Visit: Payer: Self-pay

## 2023-10-28 MED ORDER — CETIRIZINE HCL 10 MG PO TABS: 10.0000 mg | ORAL_TABLET | Freq: Every day | ORAL | 11 refills | Status: AC

## 2023-11-15 ENCOUNTER — Other Ambulatory Visit: Payer: Self-pay | Admitting: Internal Medicine

## 2023-11-22 ENCOUNTER — Other Ambulatory Visit: Payer: Self-pay | Admitting: Internal Medicine

## 2023-11-22 ENCOUNTER — Ambulatory Visit: Payer: PPO | Admitting: Cardiovascular Disease

## 2023-11-22 DIAGNOSIS — E1165 Type 2 diabetes mellitus with hyperglycemia: Secondary | ICD-10-CM | POA: Diagnosis not present

## 2023-12-09 ENCOUNTER — Other Ambulatory Visit: Payer: Self-pay | Admitting: Internal Medicine

## 2023-12-14 ENCOUNTER — Ambulatory Visit: Payer: PPO | Admitting: Cardiovascular Disease

## 2023-12-14 ENCOUNTER — Telehealth: Payer: Self-pay

## 2023-12-14 ENCOUNTER — Encounter: Payer: Self-pay | Admitting: Cardiovascular Disease

## 2023-12-14 ENCOUNTER — Ambulatory Visit: Payer: PPO | Admitting: Internal Medicine

## 2023-12-14 VITALS — BP 144/84 | HR 76 | Ht 73.0 in | Wt 217.0 lb

## 2023-12-14 DIAGNOSIS — E782 Mixed hyperlipidemia: Secondary | ICD-10-CM

## 2023-12-14 DIAGNOSIS — R3121 Asymptomatic microscopic hematuria: Secondary | ICD-10-CM | POA: Diagnosis not present

## 2023-12-14 DIAGNOSIS — R319 Hematuria, unspecified: Secondary | ICD-10-CM | POA: Diagnosis not present

## 2023-12-14 DIAGNOSIS — I6521 Occlusion and stenosis of right carotid artery: Secondary | ICD-10-CM | POA: Diagnosis not present

## 2023-12-14 DIAGNOSIS — I2510 Atherosclerotic heart disease of native coronary artery without angina pectoris: Secondary | ICD-10-CM

## 2023-12-14 DIAGNOSIS — I10 Essential (primary) hypertension: Secondary | ICD-10-CM | POA: Diagnosis not present

## 2023-12-14 LAB — POCT URINALYSIS DIPSTICK
Bilirubin, UA: NEGATIVE
Glucose, UA: POSITIVE — AB
Ketones, UA: NEGATIVE
Leukocytes, UA: NEGATIVE
Nitrite, UA: NEGATIVE
Protein, UA: NEGATIVE
Spec Grav, UA: 1.015 (ref 1.010–1.025)
Urobilinogen, UA: 0.2 U/dL
pH, UA: 8.5 — AB (ref 5.0–8.0)

## 2023-12-14 NOTE — Progress Notes (Signed)
Cardiology Office Note   Date:  12/14/2023   ID:  [name], DOB September 10, 1946, MRN [id number]  PCP:  Margaretann Loveless, MD  Cardiologist:  Adrian Blackwater, MD      History of Present Illness: [name] is a 77 y.o. male who presents for  Chief Complaint  Patient presents with   Follow-up    4 month follow up    No complaints, cardiac, but has hematuria, as on plvix/asp      Past Medical History:  Diagnosis Date   Back pain    Bladder cancer (HCC)    COVID-19 09/10/2020   Received Casirivimab/Irdevimab infusion on 09/11/20   Diabetes mellitus without complication (HCC)    HOH (hard of hearing)    Hypertension    Obesity    Renal cancer (HCC)    Renal cell cancer (HCC)    Throat cancer (HCC)    Tremor      Past Surgical History:  Procedure Laterality Date   APPENDECTOMY     BLADDER SURGERY     CAROTID PTA/STENT INTERVENTION Right 08/26/2021   Procedure: CAROTID PTA/STENT INTERVENTION;  Surgeon: Renford Dills, MD;  Location: [name];  Service: Cardiovascular;  Laterality: Right;   CATARACT EXTRACTION W/PHACO Right 01/21/2023   Procedure: CATARACT EXTRACTION PHACO AND INTRAOCULAR LENS PLACEMENT (IOC) RIGHT DIABETIC  4.89  00:45.6;  Surgeon: Estanislado Pandy, MD;  Location: [facility];  Service: Ophthalmology;  Laterality: Right;  Diabetic   CERVICAL SPINE SURGERY     CHOLECYSTECTOMY N/A 03/23/2018   Procedure: LAPAROSCOPIC CHOLECYSTECTOMY;  Surgeon: Carolan Shiver, MD;  Location: [name];  Service: General;  Laterality: N/A;   COLONOSCOPY     COLONOSCOPY WITH PROPOFOL N/A 11/10/2017   Procedure: COLONOSCOPY WITH PROPOFOL;  Surgeon: Toledo, Boykin Nearing, MD;  Location: [name];  Service: Gastroenterology;  Laterality: N/A;   ESOPHAGOGASTRODUODENOSCOPY (EGD) WITH PROPOFOL N/A 11/10/2017   Procedure: ESOPHAGOGASTRODUODENOSCOPY (EGD) WITH PROPOFOL;  Surgeon: Toledo, Boykin Nearing, MD;  Location: [name];  Service:  Gastroenterology;  Laterality: N/A;   HERNIA REPAIR     left od surgery     MICROLARYNGOSCOPY N/A 11/05/2020   Procedure: MICROLARYNGOSCOPY WITH BIOPSIES;  Surgeon: Geanie Logan, MD;  Location: [facility];  Service: ENT;  Laterality: N/A;   NEPHRECTOMY     right kidney nephrectomy   rt elbow surgery     THROAT SURGERY  1990   for cancer removal at [facility]     Current Outpatient Medications  Medication Sig Dispense Refill   acetaminophen (TYLENOL) 650 MG CR tablet Take 1,300 mg by mouth every 8 (eight) hours as needed for pain.     amitriptyline (ELAVIL) 25 MG tablet TAKE 2 TABLETS BY MOUTH AT BEDTIME 60 tablet 2   aspirin EC 81 MG tablet Take 81 mg by mouth daily. Swallow whole.     carvedilol (COREG) 6.25 MG tablet Take 1 tablet by mouth twice daily 180 tablet 0   cetirizine (ZYRTEC ALLERGY) 10 MG tablet Take 1 tablet (10 mg total) by mouth daily. 30 tablet 11   chlorthalidone (HYGROTON) 25 MG tablet Take 1 tablet by mouth once daily 90 tablet 0   empagliflozin (JARDIANCE) 25 MG TABS tablet Take 1 tablet (25 mg total) by mouth daily. 30 tablet 2   fluticasone (FLONASE) 50 MCG/ACT nasal spray Use 2 spray(s) in each nostril once daily 16 g 0   glimepiride (AMARYL) 2 MG tablet Take 1 tablet (2  mg total) by mouth 2 (two) times daily. 180 tablet 3   ibuprofen (ADVIL) 200 MG tablet Take 200 mg by mouth as needed.     insulin NPH-regular Human (NOVOLIN 70/30) (70-30) 100 UNIT/ML injection Inject 30 Units into the skin 2 (two) times daily with a meal.     lisinopril (ZESTRIL) 5 MG tablet Take 1 tablet by mouth once daily 90 tablet 0   ONETOUCH ULTRA test strip USE TO CHECK BLOOD SUGAR THREE TIMES DAILY 300 each 0   primidone (MYSOLINE) 50 MG tablet TAKE 1 TABLET BY MOUTH AT BEDTIME 90 tablet 0   rosuvastatin (CRESTOR) 40 MG tablet TAKE 1 TABLET BY MOUTH ONCE DAILY. REPLACES ATORVASTATIN 90 tablet 3   tamsulosin (FLOMAX) 0.4 MG CAPS capsule TAKE 1 CAPSULE BY MOUTH ONCE DAILY  AFTER BREAKFAST 90 capsule 3   triamcinolone (KENALOG) 0.025 % ointment Apply 1 Application topically 2 (two) times daily. (Patient not taking: Reported on 12/14/2023) 80 g 1   No current facility-administered medications for this visit.    Allergies:   Metformin    Social History:   reports that he quit smoking about 33 years ago. His smoking use included cigarettes. He has never used smokeless tobacco. He reports that he does not currently use alcohol. He reports that he does not use drugs.   Family History:  family history includes CAD in his brother and brother; Diabetes in his brother; Hypertension in his brother.    ROS:     Review of Systems  Constitutional: Negative.   HENT: Negative.    Eyes: Negative.   Respiratory: Negative.    Gastrointestinal: Negative.   Genitourinary: Negative.   Musculoskeletal: Negative.   Skin: Negative.   Neurological: Negative.   Endo/Heme/Allergies: Negative.   Psychiatric/Behavioral: Negative.    All other systems reviewed and are negative.     All other systems are reviewed and negative.    PHYSICAL EXAM: VS:  BP (!) 144/84   Pulse 76   Ht 6\' 1"  (1.854 m)   Wt 217 lb (98.4 kg)   SpO2 97%   BMI 28.63 kg/m  , BMI Body mass index is 28.63 kg/m. Last weight:  Wt Readings from Last 3 Encounters:  12/14/23 217 lb (98.4 kg)  10/14/23 218 lb (98.9 kg)  07/22/23 219 lb 9.6 oz (99.6 kg)     Physical Exam Vitals reviewed.  Constitutional:      Appearance: Normal appearance. He is normal weight.  HENT:     Head: Normocephalic.     Nose: Nose normal.     Mouth/Throat:     Mouth: Mucous membranes are moist.  Eyes:     Pupils: Pupils are equal, round, and reactive to light.  Cardiovascular:     Rate and Rhythm: Normal rate and regular rhythm.     Pulses: Normal pulses.     Heart sounds: Normal heart sounds.  Pulmonary:     Effort: Pulmonary effort is normal.  Abdominal:     General: Abdomen is flat. Bowel sounds are normal.   Musculoskeletal:        General: Normal range of motion.     Cervical back: Normal range of motion.  Skin:    General: Skin is warm.  Neurological:     General: No focal deficit present.     Mental Status: He is alert.  Psychiatric:        Mood and Affect: Mood normal.       EKG:  Recent Labs: 10/19/2023: ALT 12; BUN 17; Creatinine, Ser 1.07; Hemoglobin 14.8; Platelets 153; Potassium 4.6; Sodium 140    Lipid Panel    Component Value Date/Time   CHOL 128 10/19/2023 1032   TRIG 116 10/19/2023 1032   HDL 38 (L) 10/19/2023 1032   CHOLHDL 3.4 10/19/2023 1032   LDLCALC 69 10/19/2023 1032      Other studies Reviewed: Additional studies/ records that were reviewed today include:  Review of the above records demonstrates:       No data to display            ASSESSMENT AND PLAN:    ICD-10-CM   1. Essential hypertension, benign  I10    Repeat BP 110/70    2. Carotid stenosis, symptomatic w/o infarct, right  I65.21     3. Coronary artery disease involving native coronary artery of native heart without angina pectoris  I25.10     4. Mixed hyperlipidemia  E78.2     5. Asymptomatic microscopic hematuria  R31.21    Has hematuria, on asp/plavix with stents more than 2 years, can stopp plavix, continue asp.       Problem List Items Addressed This Visit       Cardiovascular and Mediastinum   Essential hypertension, benign - Primary   Carotid stenosis, symptomatic w/o infarct, right   Coronary artery disease involving native coronary artery of native heart without angina pectoris     Other   Hyperlipidemia   Other Visit Diagnoses       Asymptomatic microscopic hematuria       Has hematuria, on asp/plavix with stents more than 2 years, can stopp plavix, continue asp.          Disposition:   Return in about 2 months (around 02/14/2024).    Total time spent: 30 minutes  Signed,  Adrian Blackwater, MD  12/14/2023 11:47 AM    [facility]

## 2023-12-16 ENCOUNTER — Encounter: Payer: Self-pay | Admitting: Internal Medicine

## 2023-12-16 NOTE — Progress Notes (Signed)
Patient noted blood while urinating- thinks passed a stone-

## 2023-12-25 ENCOUNTER — Other Ambulatory Visit: Payer: Self-pay | Admitting: Cardiovascular Disease

## 2023-12-25 DIAGNOSIS — I10 Essential (primary) hypertension: Secondary | ICD-10-CM

## 2024-01-05 DIAGNOSIS — M5440 Lumbago with sciatica, unspecified side: Secondary | ICD-10-CM | POA: Diagnosis not present

## 2024-01-05 DIAGNOSIS — M546 Pain in thoracic spine: Secondary | ICD-10-CM | POA: Diagnosis not present

## 2024-01-05 DIAGNOSIS — G629 Polyneuropathy, unspecified: Secondary | ICD-10-CM | POA: Diagnosis not present

## 2024-01-05 DIAGNOSIS — H5440 Blindness, one eye, unspecified eye: Secondary | ICD-10-CM | POA: Diagnosis not present

## 2024-01-05 DIAGNOSIS — G8929 Other chronic pain: Secondary | ICD-10-CM | POA: Diagnosis not present

## 2024-01-05 DIAGNOSIS — L282 Other prurigo: Secondary | ICD-10-CM | POA: Diagnosis not present

## 2024-01-05 DIAGNOSIS — H50112 Monocular exotropia, left eye: Secondary | ICD-10-CM | POA: Diagnosis not present

## 2024-01-05 DIAGNOSIS — R1310 Dysphagia, unspecified: Secondary | ICD-10-CM | POA: Diagnosis not present

## 2024-01-05 DIAGNOSIS — Z85819 Personal history of malignant neoplasm of unspecified site of lip, oral cavity, and pharynx: Secondary | ICD-10-CM | POA: Diagnosis not present

## 2024-01-11 ENCOUNTER — Other Ambulatory Visit: Payer: PPO

## 2024-01-12 ENCOUNTER — Other Ambulatory Visit: Payer: Self-pay | Admitting: Student

## 2024-01-12 DIAGNOSIS — G8929 Other chronic pain: Secondary | ICD-10-CM

## 2024-01-12 DIAGNOSIS — G629 Polyneuropathy, unspecified: Secondary | ICD-10-CM

## 2024-01-14 ENCOUNTER — Encounter: Payer: Self-pay | Admitting: Internal Medicine

## 2024-01-14 ENCOUNTER — Ambulatory Visit (INDEPENDENT_AMBULATORY_CARE_PROVIDER_SITE_OTHER): Payer: PPO | Admitting: Internal Medicine

## 2024-01-14 VITALS — BP 130/80 | HR 72 | Ht 72.0 in | Wt 214.8 lb

## 2024-01-14 DIAGNOSIS — I152 Hypertension secondary to endocrine disorders: Secondary | ICD-10-CM | POA: Diagnosis not present

## 2024-01-14 DIAGNOSIS — E1142 Type 2 diabetes mellitus with diabetic polyneuropathy: Secondary | ICD-10-CM

## 2024-01-14 DIAGNOSIS — E1159 Type 2 diabetes mellitus with other circulatory complications: Secondary | ICD-10-CM | POA: Diagnosis not present

## 2024-01-14 DIAGNOSIS — E782 Mixed hyperlipidemia: Secondary | ICD-10-CM | POA: Diagnosis not present

## 2024-01-14 DIAGNOSIS — E1165 Type 2 diabetes mellitus with hyperglycemia: Secondary | ICD-10-CM | POA: Diagnosis not present

## 2024-01-14 DIAGNOSIS — E1169 Type 2 diabetes mellitus with other specified complication: Secondary | ICD-10-CM | POA: Insufficient documentation

## 2024-01-14 LAB — POC CREATINE & ALBUMIN,URINE
Creatinine, POC: 100 mg/dL
Microalbumin Ur, POC: 30 mg/L

## 2024-01-14 LAB — POCT CBG (FASTING - GLUCOSE)-MANUAL ENTRY: Glucose Fasting, POC: 164 mg/dL — AB (ref 70–99)

## 2024-01-14 NOTE — Progress Notes (Signed)
Established Patient Office Visit  Subjective:  Patient ID: [name], male    DOB: 11/15/1946  Age: 78 y.o. MRN: [id number]  Chief Complaint  Patient presents with   Follow-up    3 month follow up    Patient presented for his follow-up today.  He is generally feeling well.  Blood pressure is slightly above normal but repeat is better.  He mentions episodes of postural hypotension although not too frequently.  Advised to continue all of these medications and wear compression stockings when possible.  He is due for blood work next week.    No other concerns at this time.   Past Medical History:  Diagnosis Date   Back pain    Bladder cancer (HCC)    COVID-19 09/10/2020   Received Casirivimab/Irdevimab infusion on 09/11/20   Diabetes mellitus without complication (HCC)    HOH (hard of hearing)    Hypertension    Obesity    Renal cancer (HCC)    Renal cell cancer (HCC)    Throat cancer (HCC)    Tremor     Past Surgical History:  Procedure Laterality Date   APPENDECTOMY     BLADDER SURGERY     CAROTID PTA/STENT INTERVENTION Right 08/26/2021   Procedure: CAROTID PTA/STENT INTERVENTION;  Surgeon: Renford Dills, MD;  Location: [name];  Service: Cardiovascular;  Laterality: Right;   CATARACT EXTRACTION W/PHACO Right 01/21/2023   Procedure: CATARACT EXTRACTION PHACO AND INTRAOCULAR LENS PLACEMENT (IOC) RIGHT DIABETIC  4.89  00:45.6;  Surgeon: Estanislado Pandy, MD;  Location: [facility];  Service: Ophthalmology;  Laterality: Right;  Diabetic   CERVICAL SPINE SURGERY     CHOLECYSTECTOMY N/A 03/23/2018   Procedure: LAPAROSCOPIC CHOLECYSTECTOMY;  Surgeon: Carolan Shiver, MD;  Location: [name] OR[facility];  Service: General;  Laterality: N/A;   COLONOSCOPY     COLONOSCOPY WITH PROPOFOL N/A 11/10/2017   Procedure: COLONOSCOPY WITH PROPOFOL;  Surgeon: Toledo, Boykin Nearing, MD;  Location: [name];  Service: Gastroenterology;  Laterality: N/A;    ESOPHAGOGASTRODUODENOSCOPY (EGD) WITH PROPOFOL N/A 11/10/2017   Procedure: ESOPHAGOGASTRODUODENOSCOPY (EGD) WITH PROPOFOL;  Surgeon: Toledo, Boykin Nearing, MD;  Location: [name];  Service: Gastroenterology;  Laterality: N/A;   HERNIA REPAIR     left od surgery     MICROLARYNGOSCOPY N/A 11/05/2020   Procedure: MICROLARYNGOSCOPY WITH BIOPSIES;  Surgeon: Geanie Logan, MD;  Location: [facility];  Service: ENT;  Laterality: N/A;   NEPHRECTOMY     right kidney nephrectomy   rt elbow surgery     THROAT SURGERY  1990   for cancer removal at [facility]    Social History   Socioeconomic History   Marital status: Married    Spouse name: Not on file   Number of children: Not on file   Years of education: Not on file   Highest education level: Not on file  Occupational History   Not on file  Tobacco Use   Smoking status: Former    Current packs/day: 0.00    Types: Cigarettes    Quit date: 1991    Years since quitting: 34.0   Smokeless tobacco: Never  Vaping Use   Vaping status: Never Used  Substance and Sexual Activity   Alcohol use: Not Currently    Comment: Occasional beer drinker   Drug use: No   Sexual activity: Yes  Other Topics Concern   Not on file  Social History Narrative   Lives at home with wife, active  and independent at baseline   Social Drivers of Health   Financial Resource Strain: Not on file  Food Insecurity: No Food Insecurity (05/10/2023)   Hunger Vital Sign    Worried About Running Out of Food in the Last Year: Never true    Ran Out of Food in the Last Year: Never true  Transportation Needs: No Transportation Needs (05/10/2023)   PRAPARE - Administrator, Civil Service (Medical): No    Lack of Transportation (Non-Medical): No  Physical Activity: Not on file  Stress: Not on file  Social Connections: Not on file  Intimate Partner Violence: Not At Risk (05/10/2023)   Humiliation, Afraid, Rape, and Kick questionnaire    Fear of  Current or Ex-Partner: No    Emotionally Abused: No    Physically Abused: No    Sexually Abused: No    Family History  Problem Relation Age of Onset   Diabetes Brother    Hypertension Brother    CAD Brother    CAD Brother     Allergies  Allergen Reactions   Metformin Diarrhea    Outpatient Medications Prior to Visit  Medication Sig   acetaminophen (TYLENOL) 650 MG CR tablet Take 1,300 mg by mouth every 8 (eight) hours as needed for pain.   amitriptyline (ELAVIL) 25 MG tablet TAKE 2 TABLETS BY MOUTH AT BEDTIME   aspirin EC 81 MG tablet Take 81 mg by mouth daily. Swallow whole.   carvedilol (COREG) 6.25 MG tablet Take 1 tablet by mouth twice daily   cetirizine (ZYRTEC ALLERGY) 10 MG tablet Take 1 tablet (10 mg total) by mouth daily.   chlorthalidone (HYGROTON) 25 MG tablet Take 1 tablet by mouth once daily   empagliflozin (JARDIANCE) 25 MG TABS tablet Take 1 tablet (25 mg total) by mouth daily.   fluticasone (FLONASE) 50 MCG/ACT nasal spray Use 2 spray(s) in each nostril once daily   glimepiride (AMARYL) 2 MG tablet Take 1 tablet (2 mg total) by mouth 2 (two) times daily.   ibuprofen (ADVIL) 200 MG tablet Take 200 mg by mouth as needed.   insulin NPH-regular Human (NOVOLIN 70/30) (70-30) 100 UNIT/ML injection Inject 30 Units into the skin 2 (two) times daily with a meal.   lisinopril (ZESTRIL) 5 MG tablet Take 1 tablet by mouth once daily   ONETOUCH ULTRA test strip USE TO CHECK BLOOD SUGAR THREE TIMES DAILY   primidone (MYSOLINE) 50 MG tablet TAKE 1 TABLET BY MOUTH AT BEDTIME   rosuvastatin (CRESTOR) 40 MG tablet TAKE 1 TABLET BY MOUTH ONCE DAILY. REPLACES ATORVASTATIN   tamsulosin (FLOMAX) 0.4 MG CAPS capsule TAKE 1 CAPSULE BY MOUTH ONCE DAILY AFTER BREAKFAST   triamcinolone (KENALOG) 0.025 % ointment Apply 1 Application topically 2 (two) times daily.   No facility-administered medications prior to visit.    Review of Systems  Constitutional: Negative.  Negative for chills,  diaphoresis, fever, malaise/fatigue and weight loss.  HENT: Negative.  Negative for ear discharge and nosebleeds.   Eyes: Negative.   Respiratory: Negative.  Negative for cough and shortness of breath.   Cardiovascular: Negative.  Negative for chest pain, palpitations and leg swelling.  Gastrointestinal: Negative.  Negative for abdominal pain, constipation, diarrhea, heartburn, nausea and vomiting.  Genitourinary: Negative.  Negative for dysuria and flank pain.  Musculoskeletal: Negative.  Negative for joint pain and myalgias.  Skin: Negative.   Neurological: Negative.  Negative for dizziness, tingling, tremors and headaches.  Endo/Heme/Allergies: Negative.   Psychiatric/Behavioral: Negative.  Negative  for depression and suicidal ideas. The patient is not nervous/anxious.        Objective:   BP 130/80   Pulse 72   Ht 6' (1.829 m)   Wt 214 lb 12.8 oz (97.4 kg)   SpO2 98%   BMI 29.13 kg/m   Vitals:   01/14/24 0944 01/14/24 1012  BP: (!) 144/78 130/80  Pulse: 72   Height: 6' (1.829 m)   Weight: 214 lb 12.8 oz (97.4 kg)   SpO2: 98%   BMI (Calculated): 29.13     Physical Exam Vitals and nursing note reviewed.  Constitutional:      General: He is not in acute distress.    Appearance: Normal appearance.  HENT:     Head: Normocephalic and atraumatic.     Nose: Nose normal.     Mouth/Throat:     Mouth: Mucous membranes are moist.     Pharynx: Oropharynx is clear.  Eyes:     Conjunctiva/sclera: Conjunctivae normal.     Pupils: Pupils are equal, round, and reactive to light.  Cardiovascular:     Rate and Rhythm: Normal rate and regular rhythm.     Pulses: Normal pulses.     Heart sounds: Normal heart sounds.  Pulmonary:     Effort: Pulmonary effort is normal.     Breath sounds: Normal breath sounds. No wheezing, rhonchi or rales.  Abdominal:     General: Bowel sounds are normal.     Palpations: Abdomen is soft. There is no mass.     Tenderness: There is no abdominal  tenderness. There is no right CVA tenderness, left CVA tenderness, guarding or rebound.     Hernia: No hernia is present.  Musculoskeletal:        General: Normal range of motion.     Cervical back: Normal range of motion.     Right lower leg: No edema.     Left lower leg: No edema.  Skin:    General: Skin is warm and dry.     Findings: No rash.  Neurological:     General: No focal deficit present.     Mental Status: He is alert and oriented to person, place, and time.  Psychiatric:        Mood and Affect: Mood normal.        Behavior: Behavior normal.        Judgment: Judgment normal.      Results for orders placed or performed in visit on 01/14/24  POCT CBG (Fasting - Glucose)  Result Value Ref Range   Glucose Fasting, POC 164 (A) 70 - 99 mg/dL  POC CREATINE & ALBUMIN,URINE  Result Value Ref Range   Microalbumin Ur, POC 30 mg/L   Creatinine, POC 100 mg/dL   Albumin/Creatinine Ratio, Urine, POC 30-300     Recent Results (from the past 2160 hours)  Lipid panel     Status: Abnormal   Collection Time: 10/19/23 10:32 AM  Result Value Ref Range   Cholesterol, Total 128 100 - 199 mg/dL   Triglycerides 829 0 - 149 mg/dL   HDL 38 (L) >56 mg/dL   VLDL Cholesterol Cal 21 5 - 40 mg/dL   LDL Chol Calc (NIH) 69 0 - 99 mg/dL   Chol/HDL Ratio 3.4 0.0 - 5.0 ratio    Comment:  T. Chol/HDL Ratio                                             Men  Women                               1/2 Avg.Risk  3.4    3.3                                   Avg.Risk  5.0    4.4                                2X Avg.Risk  9.6    7.1                                3X Avg.Risk 23.4   11.0   CMP14+EGFR     Status: Abnormal   Collection Time: 10/19/23 10:32 AM  Result Value Ref Range   Glucose 121 (H) 70 - 99 mg/dL   BUN 17 8 - 27 mg/dL   Creatinine, Ser 1.61 0.76 - 1.27 mg/dL   eGFR 72 >09 UE/AVW/0.98   BUN/Creatinine Ratio 16 10 - 24   Sodium 140 134 - 144 mmol/L    Potassium 4.6 3.5 - 5.2 mmol/L   Chloride 100 96 - 106 mmol/L   CO2 26 20 - 29 mmol/L   Calcium 11.1 (H) 8.6 - 10.2 mg/dL   Total Protein 6.6 6.0 - 8.5 g/dL   Albumin 4.3 3.8 - 4.8 g/dL   Globulin, Total 2.3 1.5 - 4.5 g/dL   Bilirubin Total 0.5 0.0 - 1.2 mg/dL   Alkaline Phosphatase 68 44 - 121 IU/L   AST 18 0 - 40 IU/L   ALT 12 0 - 44 IU/L  CBC with Differential/Platelet     Status: Abnormal   Collection Time: 10/19/23 10:32 AM  Result Value Ref Range   WBC 8.8 3.4 - 10.8 x10E3/uL   RBC 5.75 4.14 - 5.80 x10E6/uL   Hemoglobin 14.8 13.0 - 17.7 g/dL   Hematocrit 11.9 14.7 - 51.0 %   MCV 84 79 - 97 fL   MCH 25.7 (L) 26.6 - 33.0 pg   MCHC 30.8 (L) 31.5 - 35.7 g/dL   RDW 82.9 56.2 - 13.0 %   Platelets 153 150 - 450 x10E3/uL   Neutrophils 72 Not Estab. %   Lymphs 13 Not Estab. %   Monocytes 7 Not Estab. %   Eos 7 Not Estab. %   Basos 1 Not Estab. %   Neutrophils Absolute 6.3 1.4 - 7.0 x10E3/uL   Lymphocytes Absolute 1.2 0.7 - 3.1 x10E3/uL   Monocytes Absolute 0.6 0.1 - 0.9 x10E3/uL   EOS (ABSOLUTE) 0.6 (H) 0.0 - 0.4 x10E3/uL   Basophils Absolute 0.1 0.0 - 0.2 x10E3/uL   Immature Granulocytes 0 Not Estab. %   Immature Grans (Abs) 0.0 0.0 - 0.1 x10E3/uL  Hemoglobin A1c     Status: Abnormal   Collection Time: 10/19/23 10:32 AM  Result Value Ref Range   Hgb A1c MFr Bld 6.9 (H) 4.8 - 5.6 %    Comment:  Prediabetes: 5.7 - 6.4          Diabetes: >6.4          Glycemic control for adults with diabetes: <7.0    Est. average glucose Bld gHb Est-mCnc 151 mg/dL  POCT Urinalysis Dipstick (16109)     Status: Abnormal   Collection Time: 12/14/23 11:34 AM  Result Value Ref Range   Color, UA Yellow    Clarity, UA Clear    Glucose, UA Positive (A) Negative   Bilirubin, UA Negative    Ketones, UA Negative    Spec Grav, UA 1.015 1.010 - 1.025   Blood, UA Trace    pH, UA 8.5 (A) 5.0 - 8.0   Protein, UA Negative Negative   Urobilinogen, UA 0.2 0.2 or 1.0 E.U./dL   Nitrite, UA  Negative    Leukocytes, UA Negative Negative   Appearance Clear    Odor NO   POCT CBG (Fasting - Glucose)     Status: Abnormal   Collection Time: 01/14/24  9:51 AM  Result Value Ref Range   Glucose Fasting, POC 164 (A) 70 - 99 mg/dL  POC CREATINE & ALBUMIN,URINE     Status: None   Collection Time: 01/14/24 10:15 AM  Result Value Ref Range   Microalbumin Ur, POC 30 mg/L   Creatinine, POC 100 mg/dL   Albumin/Creatinine Ratio, Urine, POC 30-300       Assessment & Plan:  Continue all medications.  Encouraged to drink plenty of water.  And wear compression stockings. Problem List Items Addressed This Visit     Type 2 diabetes mellitus with hyperglycemia, without long-term current use of insulin (HCC)   Relevant Orders   POCT CBG (Fasting - Glucose) (Completed)   Hemoglobin A1c   POC CREATINE & ALBUMIN,URINE (Completed)   Diabetic polyneuropathy associated with type 2 diabetes mellitus (HCC)   Hypertension associated with diabetes (HCC) - Primary   Combined hyperlipidemia associated with type 2 diabetes mellitus (HCC)   Relevant Orders   Lipid Panel w/o Chol/HDL Ratio    Return in about 3 months (around 04/13/2024).   Total time spent: 30 minutes  Margaretann Loveless, MD  01/14/2024   This document may have been prepared by [facility] Voice Recognition software and as such may include unintentional dictation errors.

## 2024-01-25 ENCOUNTER — Ambulatory Visit
Admission: RE | Admit: 2024-01-25 | Discharge: 2024-01-25 | Disposition: A | Discharge status: Home / Self Care | Payer: PPO | Source: Ambulatory Visit | Attending: Student | Admitting: Student

## 2024-01-25 DIAGNOSIS — G8929 Other chronic pain: Secondary | ICD-10-CM | POA: Insufficient documentation

## 2024-01-25 DIAGNOSIS — M546 Pain in thoracic spine: Secondary | ICD-10-CM | POA: Insufficient documentation

## 2024-01-25 DIAGNOSIS — G629 Polyneuropathy, unspecified: Secondary | ICD-10-CM | POA: Diagnosis not present

## 2024-01-25 DIAGNOSIS — M47814 Spondylosis without myelopathy or radiculopathy, thoracic region: Secondary | ICD-10-CM | POA: Diagnosis not present

## 2024-01-25 DIAGNOSIS — M47816 Spondylosis without myelopathy or radiculopathy, lumbar region: Secondary | ICD-10-CM | POA: Diagnosis not present

## 2024-01-25 DIAGNOSIS — M4804 Spinal stenosis, thoracic region: Secondary | ICD-10-CM | POA: Diagnosis not present

## 2024-01-27 ENCOUNTER — Other Ambulatory Visit: Payer: Self-pay | Admitting: Internal Medicine

## 2024-02-01 ENCOUNTER — Other Ambulatory Visit: Payer: Self-pay | Admitting: Internal Medicine

## 2024-02-02 ENCOUNTER — Telehealth (INDEPENDENT_AMBULATORY_CARE_PROVIDER_SITE_OTHER): Payer: Self-pay

## 2024-02-02 NOTE — Telephone Encounter (Signed)
 Patient spouse left a message stating that her husband cardiologist discontinue the Clopidogrel  between end December or January. Patient spouse would like to know if this is fine from a vascular standpoint. Please Advise

## 2024-02-02 NOTE — Telephone Encounter (Signed)
Patient spouse was notified with medical recommendations  

## 2024-02-02 NOTE — Telephone Encounter (Signed)
 That is fine from our standpoint

## 2024-02-05 ENCOUNTER — Other Ambulatory Visit: Payer: Self-pay | Admitting: Cardiovascular Disease

## 2024-02-05 ENCOUNTER — Other Ambulatory Visit (INDEPENDENT_AMBULATORY_CARE_PROVIDER_SITE_OTHER): Payer: Self-pay | Admitting: Nurse Practitioner

## 2024-02-05 ENCOUNTER — Other Ambulatory Visit: Payer: Self-pay | Admitting: Internal Medicine

## 2024-02-05 ENCOUNTER — Other Ambulatory Visit: Payer: Self-pay | Admitting: Family

## 2024-02-15 ENCOUNTER — Ambulatory Visit: Payer: PPO | Admitting: Cardiovascular Disease

## 2024-02-15 NOTE — Telephone Encounter (Signed)
Enter error

## 2024-03-08 ENCOUNTER — Other Ambulatory Visit (INDEPENDENT_AMBULATORY_CARE_PROVIDER_SITE_OTHER): Payer: Self-pay | Admitting: Nurse Practitioner

## 2024-03-08 DIAGNOSIS — I6523 Occlusion and stenosis of bilateral carotid arteries: Secondary | ICD-10-CM

## 2024-03-10 ENCOUNTER — Encounter (INDEPENDENT_AMBULATORY_CARE_PROVIDER_SITE_OTHER): Payer: Self-pay | Admitting: Nurse Practitioner

## 2024-03-10 ENCOUNTER — Ambulatory Visit (INDEPENDENT_AMBULATORY_CARE_PROVIDER_SITE_OTHER): Payer: PPO

## 2024-03-10 ENCOUNTER — Ambulatory Visit (INDEPENDENT_AMBULATORY_CARE_PROVIDER_SITE_OTHER): Payer: PPO | Admitting: Nurse Practitioner

## 2024-03-10 VITALS — BP 147/84 | HR 67 | Resp 18 | Ht 72.0 in | Wt 213.2 lb

## 2024-03-10 DIAGNOSIS — E7800 Pure hypercholesterolemia, unspecified: Secondary | ICD-10-CM | POA: Diagnosis not present

## 2024-03-10 DIAGNOSIS — Z794 Long term (current) use of insulin: Secondary | ICD-10-CM

## 2024-03-10 DIAGNOSIS — E119 Type 2 diabetes mellitus without complications: Secondary | ICD-10-CM

## 2024-03-10 DIAGNOSIS — I6523 Occlusion and stenosis of bilateral carotid arteries: Secondary | ICD-10-CM

## 2024-03-12 NOTE — Progress Notes (Signed)
 Subjective:    Patient ID: [name], male    DOB: 1946/09/29, 78 y.o.   MRN: [id number] Chief Complaint  Patient presents with   Follow-up    f/u in 1 year with carotid    The patient is seen for follow up evaluation of carotid stenosis. The carotid stenosis followed by ultrasound. Right ICA stent placed in 2022  The patient denies amaurosis fugax. There is no recent history of TIA symptoms or focal motor deficits. There is no prior documented CVA.  The patient is taking enteric-coated aspirin 81 mg daily.  There is no history of migraine headaches. There is no history of seizures.  The patient notes that he has been having issues with his left lower extremity going numb.  He notes that it has happened while he is driving.  This is understandably very concerned because he is unable to feel the pedals.  He also notes that this happens at other times such as when he is walking or sitting for long time frames.  The patient does have a history of neuropathy as well as lumbar radiculopathy.    There is no history of DVT, PE or superficial thrombophlebitis. No recent episodes of angina or shortness of breath documented.   Carotid Duplex done today shows 1 to 39% of the right ICA with 40 to 59% of the left ICA.  No change compared to last study in 2024    Review of Systems  All other systems reviewed and are negative.      Objective:   Physical Exam Vitals reviewed.  HENT:     Head: Normocephalic.  Neck:     Vascular: No carotid bruit.  Cardiovascular:     Rate and Rhythm: Normal rate.     Pulses: Normal pulses.  Pulmonary:     Effort: Pulmonary effort is normal.  Skin:    General: Skin is warm and dry.  Neurological:     Mental Status: He is alert and oriented to person, place, and time.  Psychiatric:        Mood and Affect: Mood normal.        Behavior: Behavior normal.        Thought Content: Thought content normal.        Judgment: Judgment normal.     BP  (!) 147/84   Pulse 67   Resp 18   Ht 6' (1.829 m)   Wt 213 lb 3.2 oz (96.7 kg)   BMI 28.92 kg/m   Past Medical History:  Diagnosis Date   Back pain    Bladder cancer (HCC)    COVID-19 09/10/2020   Received Casirivimab/Irdevimab infusion on 09/11/20   Diabetes mellitus without complication (HCC)    HOH (hard of hearing)    Hypertension    Obesity    Renal cancer (HCC)    Renal cell cancer (HCC)    Throat cancer (HCC)    Tremor     Social History   Socioeconomic History   Marital status: Married    Spouse name: Not on file   Number of children: Not on file   Years of education: Not on file   Highest education level: Not on file  Occupational History   Not on file  Tobacco Use   Smoking status: Former    Current packs/day: 0.00    Types: Cigarettes    Quit date: 1991    Years since quitting: 34.2   Smokeless tobacco: Never  Vaping Use  Vaping status: Never Used  Substance and Sexual Activity   Alcohol use: Not Currently    Comment: Occasional beer drinker   Drug use: No   Sexual activity: Yes  Other Topics Concern   Not on file  Social History Narrative   Lives at home with wife, active and independent at baseline   Social Drivers of Health   Financial Resource Strain: Not on file  Food Insecurity: No Food Insecurity (05/10/2023)   Hunger Vital Sign    Worried About Running Out of Food in the Last Year: Never true    Ran Out of Food in the Last Year: Never true  Transportation Needs: No Transportation Needs (05/10/2023)   PRAPARE - Administrator, Civil Service (Medical): No    Lack of Transportation (Non-Medical): No  Physical Activity: Not on file  Stress: Not on file  Social Connections: Not on file  Intimate Partner Violence: Not At Risk (05/10/2023)   Humiliation, Afraid, Rape, and Kick questionnaire    Fear of Current or Ex-Partner: No    Emotionally Abused: No    Physically Abused: No    Sexually Abused: No    Past Surgical  History:  Procedure Laterality Date   APPENDECTOMY     BLADDER SURGERY     CAROTID PTA/STENT INTERVENTION Right 08/26/2021   Procedure: CAROTID PTA/STENT INTERVENTION;  Surgeon: Renford Dills, MD;  Location: [name];  Service: Cardiovascular;  Laterality: Right;   CATARACT EXTRACTION W/PHACO Right 01/21/2023   Procedure: CATARACT EXTRACTION PHACO AND INTRAOCULAR LENS PLACEMENT (IOC) RIGHT DIABETIC  4.89  00:45.6;  Surgeon: Estanislado Pandy, MD;  Location: [facility];  Service: Ophthalmology;  Laterality: Right;  Diabetic   CERVICAL SPINE SURGERY     CHOLECYSTECTOMY N/A 03/23/2018   Procedure: LAPAROSCOPIC CHOLECYSTECTOMY;  Surgeon: Carolan Shiver, MD;  Location: [name];  Service: General;  Laterality: N/A;   COLONOSCOPY     COLONOSCOPY WITH PROPOFOL N/A 11/10/2017   Procedure: COLONOSCOPY WITH PROPOFOL;  Surgeon: Toledo, Boykin Nearing, MD;  Location: [name];  Service: Gastroenterology;  Laterality: N/A;   ESOPHAGOGASTRODUODENOSCOPY (EGD) WITH PROPOFOL N/A 11/10/2017   Procedure: ESOPHAGOGASTRODUODENOSCOPY (EGD) WITH PROPOFOL;  Surgeon: Toledo, Boykin Nearing, MD;  Location: [name];  Service: Gastroenterology;  Laterality: N/A;   HERNIA REPAIR     left od surgery     MICROLARYNGOSCOPY N/A 11/05/2020   Procedure: MICROLARYNGOSCOPY WITH BIOPSIES;  Surgeon: Geanie Logan, MD;  Location: [facility];  Service: ENT;  Laterality: N/A;   NEPHRECTOMY     right kidney nephrectomy   rt elbow surgery     THROAT SURGERY  1990   for cancer removal at [facility]    Family History  Problem Relation Age of Onset   Diabetes Brother    Hypertension Brother    CAD Brother    CAD Brother     Allergies  Allergen Reactions   Metformin Diarrhea       Latest Ref Rng & Units 10/19/2023   10:32 AM 07/13/2023   10:36 AM 05/10/2023   12:35 PM  CBC  WBC 3.4 - 10.8 x10E3/uL 8.8  8.9  8.7   Hemoglobin 13.0 - 17.7 g/dL 69.6  29.5  28.4   Hematocrit  37.5 - 51.0 % 48.1  46.6  49.8   Platelets 150 - 450 x10E3/uL 153   146       CMP     Component Value Date/Time   NA 140 10/19/2023 1032  K 4.6 10/19/2023 1032   CL 100 10/19/2023 1032   CO2 26 10/19/2023 1032   GLUCOSE 121 (H) 10/19/2023 1032   GLUCOSE 120 (H) 05/10/2023 1235   BUN 17 10/19/2023 1032   CREATININE 1.07 10/19/2023 1032   CALCIUM 11.1 (H) 10/19/2023 1032   PROT 6.6 10/19/2023 1032   ALBUMIN 4.3 10/19/2023 1032   AST 18 10/19/2023 1032   ALT 12 10/19/2023 1032   ALKPHOS 68 10/19/2023 1032   BILITOT 0.5 10/19/2023 1032   GFRNONAA >60 05/10/2023 1235   GFRAA >60 03/25/2018 1218     No results found.     Assessment & Plan:   1. Bilateral carotid artery stenosis Recommend:  Given the patient's asymptomatic subcritical stenosis no further invasive testing or surgery at this time.  Duplex ultrasound shows 1 to 39% stenosis of the right ICA with 40 to 59% stenosis of the left ICA  Continue antiplatelet therapy as prescribed Continue management of CAD, HTN and Hyperlipidemia Healthy heart diet,  encouraged exercise at least 4 times per week Follow up in 12 months with duplex ultrasound and physical exam  - VAS US CAROTID  2. Numbness and tingling of both lower extremities The patient has numbness and tingling that occurs more so with prolonged sitting and certain positions.  I suspect this is more related to either his neuropathy or radiculopathy.  However we will be sure to rule out a possible arterial cause with ABIs first.   2. Insulin dependent type 2 diabetes mellitus (HCC) Continue hypoglycemic medications as already ordered, these medications have been reviewed and there are no changes at this time.  Hgb A1C to be monitored as already arranged by primary service  3. Pure hypercholesterolemia Continue statin as ordered and reviewed, no changes at this time    Current Outpatient Medications on File Prior to Visit  Medication Sig Dispense  Refill   acetaminophen (TYLENOL) 650 MG CR tablet Take 1,300 mg by mouth every 8 (eight) hours as needed for pain.     amitriptyline (ELAVIL) 25 MG tablet TAKE 2 TABLETS BY MOUTH AT BEDTIME 180 tablet 3   aspirin EC 81 MG tablet Take 81 mg by mouth daily. Swallow whole.     carvedilol (COREG) 6.25 MG tablet Take 1 tablet by mouth twice daily 180 tablet 0   cetirizine (ZYRTEC ALLERGY) 10 MG tablet Take 1 tablet (10 mg total) by mouth daily. 30 tablet 11   chlorthalidone (HYGROTON) 25 MG tablet Take 1 tablet by mouth once daily 90 tablet 0   empagliflozin (JARDIANCE) 25 MG TABS tablet Take 1 tablet by mouth once daily 90 tablet 3   fluticasone (FLONASE) 50 MCG/ACT nasal spray Use 2 spray(s) in each nostril once daily 16 g 0   glimepiride (AMARYL) 2 MG tablet Take 1 tablet (2 mg total) by mouth 2 (two) times daily. 180 tablet 3   ibuprofen (ADVIL) 200 MG tablet Take 200 mg by mouth as needed.     insulin NPH-regular Human (NOVOLIN 70/30) (70-30) 100 UNIT/ML injection Inject 30 Units into the skin 2 (two) times daily with a meal.     lisinopril (ZESTRIL) 5 MG tablet Take 1 tablet by mouth once daily 90 tablet 0   ONETOUCH ULTRA test strip USE TO CHECK BLOOD SUGAR THREE TIMES DAILY 300 each 0   primidone (MYSOLINE) 50 MG tablet TAKE 1 TABLET BY MOUTH AT BEDTIME 90 tablet 0   rosuvastatin (CRESTOR) 40 MG tablet TAKE 1 TABLET BY MOUTH ONCE  DAILY. REPLACES ATORVASTATIN 90 tablet 3   tamsulosin (FLOMAX) 0.4 MG CAPS capsule TAKE 1 CAPSULE BY MOUTH ONCE DAILY AFTER BREAKFAST 90 capsule 3   triamcinolone (KENALOG) 0.025 % ointment Apply 1 Application topically 2 (two) times daily. 80 g 1   No current facility-administered medications on file prior to visit.    There are no Patient Instructions on file for this visit. No follow-ups on file.   Georgiana Spinner, NP

## 2024-03-16 ENCOUNTER — Ambulatory Visit: Payer: PPO | Admitting: Cardiovascular Disease

## 2024-03-23 ENCOUNTER — Encounter: Payer: Self-pay | Admitting: Cardiovascular Disease

## 2024-03-23 ENCOUNTER — Ambulatory Visit: Admitting: Cardiovascular Disease

## 2024-03-23 VITALS — BP 138/78 | HR 75 | Ht 72.0 in | Wt 214.0 lb

## 2024-03-23 DIAGNOSIS — E782 Mixed hyperlipidemia: Secondary | ICD-10-CM | POA: Diagnosis not present

## 2024-03-23 DIAGNOSIS — E1159 Type 2 diabetes mellitus with other circulatory complications: Secondary | ICD-10-CM

## 2024-03-23 DIAGNOSIS — I152 Hypertension secondary to endocrine disorders: Secondary | ICD-10-CM | POA: Diagnosis not present

## 2024-03-23 DIAGNOSIS — I2510 Atherosclerotic heart disease of native coronary artery without angina pectoris: Secondary | ICD-10-CM

## 2024-03-23 DIAGNOSIS — I5022 Chronic systolic (congestive) heart failure: Secondary | ICD-10-CM | POA: Diagnosis not present

## 2024-03-23 DIAGNOSIS — E1169 Type 2 diabetes mellitus with other specified complication: Secondary | ICD-10-CM

## 2024-03-23 DIAGNOSIS — R0602 Shortness of breath: Secondary | ICD-10-CM

## 2024-03-23 NOTE — Progress Notes (Signed)
 Cardiology Office Note   Date:  03/23/2024   ID:  Carden, Teel [birth year], MRN [id number]  PCP:  Margaretann Loveless, MD  Cardiologist:  Adrian Blackwater, MD      History of Present Illness: [name] is a 78 y.o. male who presents for  Chief Complaint  Patient presents with   Follow-up    2 month follow up   Pt stated BP is lower in the evening before bed, 90/58  Mornings are better 130/70    At night time has low BP  90/40 fo a week. Also gets SOB a lot.      Past Medical History:  Diagnosis Date   Back pain    Bladder cancer (HCC)    COVID-19 09/10/2020   Received Casirivimab/Irdevimab infusion on 09/11/20   Diabetes mellitus without complication (HCC)    HOH (hard of hearing)    Hypertension    Obesity    Renal cancer (HCC)    Renal cell cancer (HCC)    Throat cancer (HCC)    Tremor      Past Surgical History:  Procedure Laterality Date   APPENDECTOMY     BLADDER SURGERY     CAROTID PTA/STENT INTERVENTION Right 08/26/2021   Procedure: CAROTID PTA/STENT INTERVENTION;  Surgeon: Renford Dills, MD;  Location: [name];  Service: Cardiovascular;  Laterality: Right;   CATARACT EXTRACTION W/PHACO Right 01/21/2023   Procedure: CATARACT EXTRACTION PHACO AND INTRAOCULAR LENS PLACEMENT (IOC) RIGHT DIABETIC  4.89  00:45.6;  Surgeon: Estanislado Pandy, MD;  Location: [facility];  Service: Ophthalmology;  Laterality: Right;  Diabetic   CERVICAL SPINE SURGERY     CHOLECYSTECTOMY N/A 03/23/2018   Procedure: LAPAROSCOPIC CHOLECYSTECTOMY;  Surgeon: Carolan Shiver, MD;  Location: [name] ORS;  Service: General;  Laterality: N/A;   COLONOSCOPY     COLONOSCOPY WITH PROPOFOL N/A 11/10/2017   Procedure: COLONOSCOPY WITH PROPOFOL;  Surgeon: Toledo, Boykin Nearing, MD;  Location: [name] END[facility];  Service: Gastroenterology;  Laterality: N/A;   ESOPHAGOGASTRODUODENOSCOPY (EGD) WITH PROPOFOL N/A 11/10/2017   Procedure: ESOPHAGOGASTRODUODENOSCOPY  (EGD) WITH PROPOFOL;  Surgeon: Toledo, Boykin Nearing, MD;  Location: [name] ENDOS[facility];  Service: Gastroenterology;  Laterality: N/A;   HERNIA REPAIR     left od surgery     MICROLARYNGOSCOPY N/A 11/05/2020   Procedure: MICROLARYNGOSCOPY WITH BIOPSIES;  Surgeon: Geanie Logan, MD;  Location: [facility];  Service: ENT;  Laterality: N/A;   NEPHRECTOMY     right kidney nephrectomy   rt elbow surgery     THROAT SURGERY  1990   for cancer removal at [facility]     Current Outpatient Medications  Medication Sig Dispense Refill   acetaminophen (TYLENOL) 650 MG CR tablet Take 1,300 mg by mouth every 8 (eight) hours as needed for pain.     amitriptyline (ELAVIL) 25 MG tablet TAKE 2 TABLETS BY MOUTH AT BEDTIME 180 tablet 3   aspirin EC 81 MG tablet Take 81 mg by mouth daily. Swallow whole.     carvedilol (COREG) 6.25 MG tablet Take 1 tablet by mouth twice daily 180 tablet 0   cetirizine (ZYRTEC ALLERGY) 10 MG tablet Take 1 tablet (10 mg total) by mouth daily. 30 tablet 11   chlorthalidone (HYGROTON) 25 MG tablet Take 1 tablet by mouth once daily 90 tablet 0   empagliflozin (JARDIANCE) 25 MG TABS tablet Take 1 tablet by mouth once daily 90 tablet 3   fluticasone (FLONASE) 50 MCG/ACT nasal  spray Use 2 spray(s) in each nostril once daily 16 g 0   glimepiride (AMARYL) 2 MG tablet Take 1 tablet (2 mg total) by mouth 2 (two) times daily. 180 tablet 3   ibuprofen (ADVIL) 200 MG tablet Take 200 mg by mouth as needed.     insulin NPH-regular Human (NOVOLIN 70/30) (70-30) 100 UNIT/ML injection Inject 30 Units into the skin 2 (two) times daily with a meal.     lisinopril (ZESTRIL) 5 MG tablet Take 1 tablet by mouth once daily 90 tablet 0   ONETOUCH ULTRA test strip USE TO CHECK BLOOD SUGAR THREE TIMES DAILY 300 each 0   primidone (MYSOLINE) 50 MG tablet TAKE 1 TABLET BY MOUTH AT BEDTIME 90 tablet 0   rosuvastatin (CRESTOR) 40 MG tablet TAKE 1 TABLET BY MOUTH ONCE DAILY. REPLACES ATORVASTATIN 90 tablet 3    tamsulosin (FLOMAX) 0.4 MG CAPS capsule TAKE 1 CAPSULE BY MOUTH ONCE DAILY AFTER BREAKFAST 90 capsule 3   triamcinolone (KENALOG) 0.025 % ointment Apply 1 Application topically 2 (two) times daily. 80 g 1   No current facility-administered medications for this visit.    Allergies:   Metformin    Social History:   reports that he quit smoking about 34 years ago. His smoking use included cigarettes. He has never used smokeless tobacco. He reports that he does not currently use alcohol. He reports that he does not use drugs.   Family History:  family history includes CAD in his brother and brother; Diabetes in his brother; Hypertension in his brother.    ROS:     Review of Systems  Constitutional: Negative.   HENT: Negative.    Eyes: Negative.   Respiratory: Negative.    Gastrointestinal: Negative.   Genitourinary: Negative.   Musculoskeletal: Negative.   Skin: Negative.   Neurological: Negative.   Endo/Heme/Allergies: Negative.   Psychiatric/Behavioral: Negative.    All other systems reviewed and are negative.     All other systems are reviewed and negative.    PHYSICAL EXAM: VS:  BP 138/78   Pulse 75   Ht 6' (1.829 m)   Wt 214 lb (97.1 kg)   SpO2 97%   BMI 29.02 kg/m  , BMI Body mass index is 29.02 kg/m. Last weight:  Wt Readings from Last 3 Encounters:  03/23/24 214 lb (97.1 kg)  03/10/24 213 lb 3.2 oz (96.7 kg)  01/14/24 214 lb 12.8 oz (97.4 kg)     Physical Exam Vitals reviewed.  Constitutional:      Appearance: Normal appearance. He is normal weight.  HENT:     Head: Normocephalic.     Nose: Nose normal.     Mouth/Throat:     Mouth: Mucous membranes are moist.  Eyes:     Pupils: Pupils are equal, round, and reactive to light.  Cardiovascular:     Rate and Rhythm: Normal rate and regular rhythm.     Pulses: Normal pulses.     Heart sounds: Normal heart sounds.  Pulmonary:     Effort: Pulmonary effort is normal.  Abdominal:     General: Abdomen  is flat. Bowel sounds are normal.  Musculoskeletal:        General: Normal range of motion.     Cervical back: Normal range of motion.  Skin:    General: Skin is warm.  Neurological:     General: No focal deficit present.     Mental Status: He is alert.  Psychiatric:  Mood and Affect: Mood normal.       EKG:   Recent Labs: 10/19/2023: ALT 12; BUN 17; Creatinine, Ser 1.07; Hemoglobin 14.8; Platelets 153; Potassium 4.6; Sodium 140    Lipid Panel    Component Value Date/Time   CHOL 128 10/19/2023 1032   TRIG 116 10/19/2023 1032   HDL 38 (L) 10/19/2023 1032   CHOLHDL 3.4 10/19/2023 1032   LDLCALC 69 10/19/2023 1032      Other studies Reviewed: Additional studies/ records that were reviewed today include:  Review of the above records demonstrates:       No data to display            ASSESSMENT AND PLAN:    ICD-10-CM   1. SOB (shortness of breath)  R06.02 PCV ECHOCARDIOGRAM COMPLETE    MYOCARDIAL PERFUSION IMAGING   SOB on exertion with CHF, advise echo, stress test    2. Hypertension associated with diabetes (HCC)  E11.59 PCV ECHOCARDIOGRAM COMPLETE   I15.2 MYOCARDIAL PERFUSION IMAGING   Does not feel dizzy when at night lower BP, so need to be concerned as normal or high right now    3. Combined hyperlipidemia associated with type 2 diabetes mellitus (HCC)  E11.69 PCV ECHOCARDIOGRAM COMPLETE   E78.2 MYOCARDIAL PERFUSION IMAGING    4. Coronary artery disease involving native coronary artery of native heart without angina pectoris  I25.10 PCV ECHOCARDIOGRAM COMPLETE    MYOCARDIAL PERFUSION IMAGING    5. Chronic systolic congestive heart failure, NYHA class 1 (HCC)  I50.22 PCV ECHOCARDIOGRAM COMPLETE    MYOCARDIAL PERFUSION IMAGING       Problem List Items Addressed This Visit       Cardiovascular and Mediastinum   Coronary artery disease involving native coronary artery of native heart without angina pectoris   Relevant Orders   PCV  ECHOCARDIOGRAM COMPLETE   MYOCARDIAL PERFUSION IMAGING   Hypertension associated with diabetes (HCC)   Relevant Orders   PCV ECHOCARDIOGRAM COMPLETE   MYOCARDIAL PERFUSION IMAGING     Endocrine   Combined hyperlipidemia associated with type 2 diabetes mellitus (HCC)   Relevant Orders   PCV ECHOCARDIOGRAM COMPLETE   MYOCARDIAL PERFUSION IMAGING   Other Visit Diagnoses       SOB (shortness of breath)    -  Primary   SOB on exertion with CHF, advise echo, stress test   Relevant Orders   PCV ECHOCARDIOGRAM COMPLETE   MYOCARDIAL PERFUSION IMAGING     Chronic systolic congestive heart failure, NYHA class 1 (HCC)       Relevant Orders   PCV ECHOCARDIOGRAM COMPLETE   MYOCARDIAL PERFUSION IMAGING          Disposition:   Return in about 4 weeks (around 04/20/2024) for echo, stress test and f/u.    Total time spent: 35 minutes  Signed,  Adrian Blackwater, MD  03/23/2024 10:25 AM    [facility]

## 2024-03-25 ENCOUNTER — Other Ambulatory Visit: Payer: Self-pay | Admitting: Cardiovascular Disease

## 2024-03-25 DIAGNOSIS — I10 Essential (primary) hypertension: Secondary | ICD-10-CM

## 2024-03-30 ENCOUNTER — Other Ambulatory Visit

## 2024-04-03 DIAGNOSIS — D0439 Carcinoma in situ of skin of other parts of face: Secondary | ICD-10-CM | POA: Diagnosis not present

## 2024-04-03 DIAGNOSIS — L570 Actinic keratosis: Secondary | ICD-10-CM | POA: Diagnosis not present

## 2024-04-03 DIAGNOSIS — L400 Psoriasis vulgaris: Secondary | ICD-10-CM | POA: Diagnosis not present

## 2024-04-03 DIAGNOSIS — D485 Neoplasm of uncertain behavior of skin: Secondary | ICD-10-CM | POA: Diagnosis not present

## 2024-04-03 DIAGNOSIS — C44319 Basal cell carcinoma of skin of other parts of face: Secondary | ICD-10-CM | POA: Diagnosis not present

## 2024-04-04 DIAGNOSIS — M546 Pain in thoracic spine: Secondary | ICD-10-CM | POA: Diagnosis not present

## 2024-04-04 DIAGNOSIS — G629 Polyneuropathy, unspecified: Secondary | ICD-10-CM | POA: Diagnosis not present

## 2024-04-04 DIAGNOSIS — G8929 Other chronic pain: Secondary | ICD-10-CM | POA: Diagnosis not present

## 2024-04-04 DIAGNOSIS — M5440 Lumbago with sciatica, unspecified side: Secondary | ICD-10-CM | POA: Diagnosis not present

## 2024-04-05 ENCOUNTER — Ambulatory Visit

## 2024-04-05 DIAGNOSIS — R0602 Shortness of breath: Secondary | ICD-10-CM

## 2024-04-05 DIAGNOSIS — I2510 Atherosclerotic heart disease of native coronary artery without angina pectoris: Secondary | ICD-10-CM

## 2024-04-05 DIAGNOSIS — I5022 Chronic systolic (congestive) heart failure: Secondary | ICD-10-CM

## 2024-04-05 DIAGNOSIS — I340 Nonrheumatic mitral (valve) insufficiency: Secondary | ICD-10-CM | POA: Diagnosis not present

## 2024-04-05 DIAGNOSIS — E1159 Type 2 diabetes mellitus with other circulatory complications: Secondary | ICD-10-CM

## 2024-04-05 DIAGNOSIS — I152 Hypertension secondary to endocrine disorders: Secondary | ICD-10-CM

## 2024-04-05 DIAGNOSIS — E1169 Type 2 diabetes mellitus with other specified complication: Secondary | ICD-10-CM

## 2024-04-06 ENCOUNTER — Ambulatory Visit

## 2024-04-13 ENCOUNTER — Ambulatory Visit (INDEPENDENT_AMBULATORY_CARE_PROVIDER_SITE_OTHER): Payer: PPO | Admitting: Internal Medicine

## 2024-04-13 ENCOUNTER — Encounter: Payer: Self-pay | Admitting: Internal Medicine

## 2024-04-13 VITALS — BP 132/78 | HR 94 | Ht 72.0 in | Wt 213.4 lb

## 2024-04-13 DIAGNOSIS — I152 Hypertension secondary to endocrine disorders: Secondary | ICD-10-CM

## 2024-04-13 DIAGNOSIS — I2510 Atherosclerotic heart disease of native coronary artery without angina pectoris: Secondary | ICD-10-CM

## 2024-04-13 DIAGNOSIS — R1032 Left lower quadrant pain: Secondary | ICD-10-CM | POA: Diagnosis not present

## 2024-04-13 DIAGNOSIS — E782 Mixed hyperlipidemia: Secondary | ICD-10-CM

## 2024-04-13 DIAGNOSIS — R319 Hematuria, unspecified: Secondary | ICD-10-CM | POA: Diagnosis not present

## 2024-04-13 DIAGNOSIS — E1165 Type 2 diabetes mellitus with hyperglycemia: Secondary | ICD-10-CM

## 2024-04-13 DIAGNOSIS — E1169 Type 2 diabetes mellitus with other specified complication: Secondary | ICD-10-CM

## 2024-04-13 DIAGNOSIS — E1159 Type 2 diabetes mellitus with other circulatory complications: Secondary | ICD-10-CM | POA: Diagnosis not present

## 2024-04-13 DIAGNOSIS — E1142 Type 2 diabetes mellitus with diabetic polyneuropathy: Secondary | ICD-10-CM | POA: Diagnosis not present

## 2024-04-13 LAB — POCT URINALYSIS DIPSTICK
Bilirubin, UA: NEGATIVE
Glucose, UA: POSITIVE — AB
Ketones, UA: NEGATIVE
Leukocytes, UA: NEGATIVE
Nitrite, UA: NEGATIVE
Protein, UA: NEGATIVE
Spec Grav, UA: 1.01 (ref 1.010–1.025)
Urobilinogen, UA: 0.2 U/dL
pH, UA: 6 (ref 5.0–8.0)

## 2024-04-13 LAB — POC CREATINE & ALBUMIN,URINE
Albumin/Creatinine Ratio, Urine, POC: <30
Creatinine, POC: 100 mg/dL
Microalbumin Ur, POC: 30 mg/L

## 2024-04-13 LAB — POCT CBG (FASTING - GLUCOSE)-MANUAL ENTRY: Glucose Fasting, POC: 136 mg/dL — AB (ref 70–99)

## 2024-04-13 MED ORDER — CIPROFLOXACIN HCL 250 MG PO TABS: 250.0000 mg | ORAL_TABLET | Freq: Two times a day (BID) | ORAL | 0 refills | Status: DC

## 2024-04-13 NOTE — Progress Notes (Signed)
 Established Patient Office Visit  Subjective:  Patient ID: [name], male    DOB: [birth year]  Age: 78 y.o. MRN: [id number]  Chief Complaint  Patient presents with   Follow-up    3 month follow up    Patient comes in for his follow-up and is also complaining of left flank pain and left lower quadrant pain for last 2 days.  He has a history of left kidney stone and thinks that he has been trying to pass it since his urine turned red.  However today it is starting to look better.  He does not have any fevers or chills, no nausea or vomiting.  But the pain is intermittent.  He is already on Flomax daily.  Patient is s/p right nephrectomy for renal cancer.  His  CT scan of the abdomen from 03/2023 had shown a small nonobstructing stone in his left kidney which could be moving now.  Will schedule another CT scan with stone protocol.      No other concerns at this time.   Past Medical History:  Diagnosis Date   Back pain    Bladder cancer (HCC)    COVID-19 09/10/2020   Received Casirivimab/Irdevimab infusion on 09/11/20   Diabetes mellitus without complication (HCC)    HOH (hard of hearing)    Hypertension    Obesity    Renal cancer (HCC)    Renal cell cancer (HCC)    Throat cancer (HCC)    Tremor     Past Surgical History:  Procedure Laterality Date   APPENDECTOMY     BLADDER SURGERY     CAROTID PTA/STENT INTERVENTION Right 08/26/2021   Procedure: CAROTID PTA/STENT INTERVENTION;  Surgeon: Renford Dills, MD;  Location: [name];  Service: Cardiovascular;  Laterality: Right;   CATARACT EXTRACTION W/PHACO Right 01/21/2023   Procedure: CATARACT EXTRACTION PHACO AND INTRAOCULAR LENS PLACEMENT (IOC) RIGHT DIABETIC  4.89  00:45.6;  Surgeon: Estanislado Pandy, MD;  Location: [facility];  Service: Ophthalmology;  Laterality: Right;  Diabetic   CERVICAL SPINE SURGERY     CHOLECYSTECTOMY N/A 03/23/2018   Procedure: LAPAROSCOPIC CHOLECYSTECTOMY;  Surgeon:  Carolan Shiver, MD;  Location: [name] OR[facility];  Service: General;  Laterality: N/A;   COLONOSCOPY     COLONOSCOPY WITH PROPOFOL N/A 11/10/2017   Procedure: COLONOSCOPY WITH PROPOFOL;  Surgeon: Toledo, Boykin Nearing, MD;  Location: [name];  Service: Gastroenterology;  Laterality: N/A;   ESOPHAGOGASTRODUODENOSCOPY (EGD) WITH PROPOFOL N/A 11/10/2017   Procedure: ESOPHAGOGASTRODUODENOSCOPY (EGD) WITH PROPOFOL;  Surgeon: Toledo, Boykin Nearing, MD;  Location: [name];  Service: Gastroenterology;  Laterality: N/A;   HERNIA REPAIR     left od surgery     MICROLARYNGOSCOPY N/A 11/05/2020   Procedure: MICROLARYNGOSCOPY WITH BIOPSIES;  Surgeon: Geanie Logan, MD;  Location: [facility];  Service: ENT;  Laterality: N/A;   NEPHRECTOMY     right kidney nephrectomy   rt elbow surgery     THROAT SURGERY  1990   for cancer removal at [facility]    Social History   Socioeconomic History   Marital status: Married    Spouse name: Not on file   Number of children: Not on file   Years of education: Not on file   Highest education level: Not on file  Occupational History   Not on file  Tobacco Use   Smoking status: Former    Current packs/day: 0.00    Types: Cigarettes    Quit date: 21  Years since quitting: 34.3   Smokeless tobacco: Never  Vaping Use   Vaping status: Never Used  Substance and Sexual Activity   Alcohol use: Not Currently    Comment: Occasional beer drinker   Drug use: No   Sexual activity: Yes  Other Topics Concern   Not on file  Social History Narrative   Lives at home with wife, active and independent at baseline   Social Drivers of Health   Financial Resource Strain: Not on file  Food Insecurity: No Food Insecurity (05/10/2023)   Hunger Vital Sign    Worried About Running Out of Food in the Last Year: Never true    Ran Out of Food in the Last Year: Never true  Transportation Needs: No Transportation Needs (05/10/2023)   PRAPARE - Therapist, art (Medical): No    Lack of Transportation (Non-Medical): No  Physical Activity: Not on file  Stress: Not on file  Social Connections: Not on file  Intimate Partner Violence: Not At Risk (05/10/2023)   Humiliation, Afraid, Rape, and Kick questionnaire    Fear of Current or Ex-Partner: No    Emotionally Abused: No    Physically Abused: No    Sexually Abused: No    Family History  Problem Relation Age of Onset   Diabetes Brother    Hypertension Brother    CAD Brother    CAD Brother     Allergies  Allergen Reactions   Metformin Diarrhea    Outpatient Medications Prior to Visit  Medication Sig   acetaminophen (TYLENOL) 650 MG CR tablet Take 1,300 mg by mouth every 8 (eight) hours as needed for pain.   amitriptyline (ELAVIL) 25 MG tablet TAKE 2 TABLETS BY MOUTH AT BEDTIME   aspirin EC 81 MG tablet Take 81 mg by mouth daily. Swallow whole.   carvedilol (COREG) 6.25 MG tablet Take 1 tablet by mouth twice daily   cetirizine (ZYRTEC ALLERGY) 10 MG tablet Take 1 tablet (10 mg total) by mouth daily.   chlorthalidone (HYGROTON) 25 MG tablet Take 1 tablet by mouth once daily   empagliflozin (JARDIANCE) 25 MG TABS tablet Take 1 tablet by mouth once daily   fluticasone (FLONASE) 50 MCG/ACT nasal spray Use 2 spray(s) in each nostril once daily   glimepiride (AMARYL) 2 MG tablet Take 1 tablet (2 mg total) by mouth 2 (two) times daily.   ibuprofen (ADVIL) 200 MG tablet Take 200 mg by mouth as needed.   insulin NPH-regular Human (NOVOLIN 70/30) (70-30) 100 UNIT/ML injection Inject 30 Units into the skin 2 (two) times daily with a meal.   lisinopril (ZESTRIL) 5 MG tablet Take 1 tablet by mouth once daily   ONETOUCH ULTRA test strip USE TO CHECK BLOOD SUGAR THREE TIMES DAILY   primidone (MYSOLINE) 50 MG tablet TAKE 1 TABLET BY MOUTH AT BEDTIME   rosuvastatin (CRESTOR) 40 MG tablet TAKE 1 TABLET BY MOUTH ONCE DAILY. REPLACES ATORVASTATIN   tamsulosin (FLOMAX) 0.4 MG CAPS  capsule TAKE 1 CAPSULE BY MOUTH ONCE DAILY AFTER BREAKFAST   triamcinolone (KENALOG) 0.025 % ointment Apply 1 Application topically 2 (two) times daily.   No facility-administered medications prior to visit.    Review of Systems  Constitutional: Negative.  Negative for chills, fever, malaise/fatigue and weight loss.  HENT: Negative.  Negative for sore throat.   Eyes: Negative.   Respiratory: Negative.  Negative for cough and shortness of breath.   Cardiovascular: Negative.  Negative for chest pain,  palpitations and leg swelling.  Gastrointestinal: Negative.  Negative for abdominal pain, constipation, diarrhea, heartburn, nausea and vomiting.  Genitourinary: Negative.  Negative for dysuria and flank pain.  Musculoskeletal: Negative.  Negative for joint pain and myalgias.  Skin: Negative.   Neurological: Negative.  Negative for dizziness, tingling, tremors and headaches.  Endo/Heme/Allergies: Negative.   Psychiatric/Behavioral: Negative.  Negative for depression and suicidal ideas. The patient is not nervous/anxious.        Objective:   BP 132/78   Pulse 94   Ht 6' (1.829 m)   Wt 213 lb 6.4 oz (96.8 kg)   SpO2 99%   BMI 28.94 kg/m   Vitals:   04/13/24 0949  BP: 132/78  Pulse: 94  Height: 6' (1.829 m)  Weight: 213 lb 6.4 oz (96.8 kg)  SpO2: 99%  BMI (Calculated): 28.94    Physical Exam Vitals and nursing note reviewed.  Constitutional:      General: He is not in acute distress.    Appearance: Normal appearance.  HENT:     Head: Normocephalic and atraumatic.     Nose: Nose normal.     Mouth/Throat:     Mouth: Mucous membranes are moist.     Pharynx: Oropharynx is clear.  Eyes:     Conjunctiva/sclera: Conjunctivae normal.     Pupils: Pupils are equal, round, and reactive to light.  Cardiovascular:     Rate and Rhythm: Normal rate and regular rhythm.     Pulses: Normal pulses.     Heart sounds: Normal heart sounds.  Pulmonary:     Effort: Pulmonary effort is  normal.     Breath sounds: Normal breath sounds. No wheezing, rhonchi or rales.  Abdominal:     General: Bowel sounds are normal.     Palpations: Abdomen is soft. There is no mass.     Tenderness: There is abdominal tenderness. There is left CVA tenderness. There is no right CVA tenderness, guarding or rebound.     Hernia: No hernia is present.  Musculoskeletal:        General: Normal range of motion.     Cervical back: Normal range of motion.     Right lower leg: No edema.     Left lower leg: No edema.  Skin:    General: Skin is warm and dry.     Findings: No rash.  Neurological:     General: No focal deficit present.     Mental Status: He is alert and oriented to person, place, and time.  Psychiatric:        Mood and Affect: Mood normal.        Behavior: Behavior normal.        Judgment: Judgment normal.      Results for orders placed or performed in visit on 04/13/24  POCT CBG (Fasting - Glucose)  Result Value Ref Range   Glucose Fasting, POC 136 (A) 70 - 99 mg/dL  POCT Urinalysis Dipstick (81002)  Result Value Ref Range   Color, UA Yellow    Clarity, UA Clear    Glucose, UA Positive (A) Negative   Bilirubin, UA Negative    Ketones, UA Negative    Spec Grav, UA 1.010 1.010 - 1.025   Blood, UA Large    pH, UA 6.0 5.0 - 8.0   Protein, UA Negative Negative   Urobilinogen, UA 0.2 0.2 or 1.0 E.U./dL   Nitrite, UA Negative    Leukocytes, UA Negative Negative   Appearance Clear  Odor Yes   POC CREATINE & ALBUMIN,URINE  Result Value Ref Range   Microalbumin Ur, POC 30 mg/L   Creatinine, POC 100 mg/dL   Albumin/Creatinine Ratio, Urine, POC <30     Recent Results (from the past 2160 hours)  POCT CBG (Fasting - Glucose)     Status: Abnormal   Collection Time: 04/13/24  9:55 AM  Result Value Ref Range   Glucose Fasting, POC 136 (A) 70 - 99 mg/dL  POCT Urinalysis Dipstick (40981)     Status: Abnormal   Collection Time: 04/13/24 10:15 AM  Result Value Ref Range    Color, UA Yellow    Clarity, UA Clear    Glucose, UA Positive (A) Negative   Bilirubin, UA Negative    Ketones, UA Negative    Spec Grav, UA 1.010 1.010 - 1.025   Blood, UA Large    pH, UA 6.0 5.0 - 8.0   Protein, UA Negative Negative   Urobilinogen, UA 0.2 0.2 or 1.0 E.U./dL   Nitrite, UA Negative    Leukocytes, UA Negative Negative   Appearance Clear    Odor Yes   POC CREATINE & ALBUMIN,URINE     Status: None   Collection Time: 04/13/24 10:15 AM  Result Value Ref Range   Microalbumin Ur, POC 30 mg/L   Creatinine, POC 100 mg/dL   Albumin/Creatinine Ratio, Urine, POC <30       Assessment & Plan:  His urine dipstick shows large amount of blood.  And he is also tender, will schedule CT scan of the abdomen for stone protocol.  Add p.o. Cipro.  Check labs today.  Patient advised to drink lots of water. Problem List Items Addressed This Visit     Type 2 diabetes mellitus with hyperglycemia, without long-term current use of insulin (HCC)   Relevant Orders   POCT CBG (Fasting - Glucose) (Completed)   Hemoglobin A1c   POC CREATINE & ALBUMIN,URINE (Completed)   Diabetic polyneuropathy associated with type 2 diabetes mellitus (HCC)   Coronary artery disease involving native coronary artery of native heart without angina pectoris   Hypertension associated with diabetes (HCC) - Primary   Relevant Orders   CMP14+EGFR   Combined hyperlipidemia associated with type 2 diabetes mellitus (HCC)   Relevant Orders   Lipid Panel w/o Chol/HDL Ratio   Other Visit Diagnoses       Hematuria, unspecified type       Relevant Medications   ciprofloxacin (CIPRO) 250 MG tablet   Other Relevant Orders   CBC with Diff   POCT Urinalysis Dipstick (81002) (Completed)     Left lower quadrant abdominal pain       Relevant Orders   CT ABDOMEN PELVIS WO CONTRAST       Return in about 1 week (around 04/20/2024).   Total time spent: 30 minutes  Aisha Hove, MD  04/13/2024   This document may  have been prepared by [facility] Voice Recognition software and as such may include unintentional dictation errors.

## 2024-04-14 LAB — LIPID PANEL W/O CHOL/HDL RATIO
Cholesterol, Total: 131 mg/dL (ref 100–199)
HDL: 41 mg/dL (ref >39)
LDL Chol Calc (NIH): 72 mg/dL (ref 0–99)
Triglycerides: 95 mg/dL (ref 0–149)
VLDL Cholesterol Cal: 18 mg/dL (ref 5–40)

## 2024-04-14 LAB — CBC WITH DIFFERENTIAL/PLATELET
Basophils Absolute: 0.1 10*3/uL (ref 0.0–0.2)
Basos: 1 %
EOS (ABSOLUTE): 0.7 10*3/uL — ABNORMAL HIGH (ref 0.0–0.4)
Eos: 7 %
Hematocrit: 49.6 % (ref 37.5–51.0)
Hemoglobin: 15.7 g/dL (ref 13.0–17.7)
Immature Grans (Abs): 0 10*3/uL (ref 0.0–0.1)
Immature Granulocytes: 0 %
Lymphocytes Absolute: 1.3 10*3/uL (ref 0.7–3.1)
Lymphs: 13 %
MCH: 25.8 pg — ABNORMAL LOW (ref 26.6–33.0)
MCHC: 31.7 g/dL (ref 31.5–35.7)
MCV: 81 fL (ref 79–97)
Monocytes Absolute: 0.7 10*3/uL (ref 0.1–0.9)
Monocytes: 7 %
Neutrophils Absolute: 6.8 10*3/uL (ref 1.4–7.0)
Neutrophils: 72 %
Platelets: 150 10*3/uL (ref 150–450)
RBC: 6.09 x10E6/uL — ABNORMAL HIGH (ref 4.14–5.80)
RDW: 16.8 % — ABNORMAL HIGH (ref 11.6–15.4)
WBC: 9.5 10*3/uL (ref 3.4–10.8)

## 2024-04-14 LAB — CMP14+EGFR
ALT: 12 IU/L (ref 0–44)
AST: 15 IU/L (ref 0–40)
Albumin: 4.6 g/dL (ref 3.8–4.8)
Alkaline Phosphatase: 72 IU/L (ref 44–121)
BUN/Creatinine Ratio: 16 (ref 10–24)
BUN: 18 mg/dL (ref 8–27)
Bilirubin Total: 0.5 mg/dL (ref 0.0–1.2)
CO2: 20 mmol/L (ref 20–29)
Calcium: 12 mg/dL — ABNORMAL HIGH (ref 8.6–10.2)
Chloride: 102 mmol/L (ref 96–106)
Creatinine, Ser: 1.13 mg/dL (ref 0.76–1.27)
Globulin, Total: 2.6 g/dL (ref 1.5–4.5)
Glucose: 116 mg/dL — ABNORMAL HIGH (ref 70–99)
Potassium: 4.2 mmol/L (ref 3.5–5.2)
Sodium: 141 mmol/L (ref 134–144)
Total Protein: 7.2 g/dL (ref 6.0–8.5)
eGFR: 68 mL/min/{1.73_m2} (ref >59)

## 2024-04-14 LAB — HEMOGLOBIN A1C
Est. average glucose Bld gHb Est-mCnc: 146 mg/dL
Hgb A1c MFr Bld: 6.7 % — ABNORMAL HIGH (ref 4.8–5.6)

## 2024-04-17 ENCOUNTER — Other Ambulatory Visit: Payer: Self-pay | Admitting: Internal Medicine

## 2024-04-17 ENCOUNTER — Telehealth: Payer: Self-pay | Admitting: Internal Medicine

## 2024-04-17 DIAGNOSIS — R319 Hematuria, unspecified: Secondary | ICD-10-CM

## 2024-04-17 MED ORDER — NITROFURANTOIN MONOHYD MACRO 100 MG PO CAPS: 100.0000 mg | ORAL_CAPSULE | Freq: Two times a day (BID) | ORAL | 0 refills | Status: AC

## 2024-04-17 NOTE — Telephone Encounter (Signed)
 [name] called in and is requesting a different antibiotic be sent in than the cipro . He is refusing to take the cipro  because they read the side effects and said that there are too many side effects. Can we send a different antibiotic? Please advise.

## 2024-04-19 NOTE — Progress Notes (Signed)
 Patient notified

## 2024-04-20 ENCOUNTER — Ambulatory Visit (INDEPENDENT_AMBULATORY_CARE_PROVIDER_SITE_OTHER)

## 2024-04-20 ENCOUNTER — Ambulatory Visit: Admitting: Cardiovascular Disease

## 2024-04-20 DIAGNOSIS — I5022 Chronic systolic (congestive) heart failure: Secondary | ICD-10-CM | POA: Diagnosis not present

## 2024-04-20 DIAGNOSIS — I2510 Atherosclerotic heart disease of native coronary artery without angina pectoris: Secondary | ICD-10-CM | POA: Diagnosis not present

## 2024-04-20 DIAGNOSIS — E1159 Type 2 diabetes mellitus with other circulatory complications: Secondary | ICD-10-CM

## 2024-04-20 DIAGNOSIS — I6521 Occlusion and stenosis of right carotid artery: Secondary | ICD-10-CM

## 2024-04-20 DIAGNOSIS — R0602 Shortness of breath: Secondary | ICD-10-CM

## 2024-04-21 ENCOUNTER — Ambulatory Visit: Admitting: Internal Medicine

## 2024-04-24 ENCOUNTER — Ambulatory Visit: Admitting: Cardiovascular Disease

## 2024-04-24 ENCOUNTER — Other Ambulatory Visit: Payer: Self-pay | Admitting: Family

## 2024-04-24 ENCOUNTER — Encounter: Payer: Self-pay | Admitting: Cardiovascular Disease

## 2024-04-24 VITALS — BP 102/70 | HR 81 | Ht 72.0 in | Wt 212.6 lb

## 2024-04-24 DIAGNOSIS — I2510 Atherosclerotic heart disease of native coronary artery without angina pectoris: Secondary | ICD-10-CM

## 2024-04-24 DIAGNOSIS — E1169 Type 2 diabetes mellitus with other specified complication: Secondary | ICD-10-CM

## 2024-04-24 DIAGNOSIS — E782 Mixed hyperlipidemia: Secondary | ICD-10-CM

## 2024-04-24 DIAGNOSIS — I152 Hypertension secondary to endocrine disorders: Secondary | ICD-10-CM

## 2024-04-24 DIAGNOSIS — E1159 Type 2 diabetes mellitus with other circulatory complications: Secondary | ICD-10-CM

## 2024-04-24 DIAGNOSIS — R0602 Shortness of breath: Secondary | ICD-10-CM | POA: Diagnosis not present

## 2024-04-24 DIAGNOSIS — I5022 Chronic systolic (congestive) heart failure: Secondary | ICD-10-CM

## 2024-04-24 NOTE — Progress Notes (Signed)
 Cardiology Office Note   Date:  04/24/2024   ID:  Terris, Prigge [birth year], MRN [id number]  PCP:  Aisha Hove, MD  Cardiologist:  Debborah Fairly, MD      History of Present Illness: [name] is a 78 y.o. male who presents for  Chief Complaint  Patient presents with   Follow-up    4 week NST results    Occasionally SOB, and dizziness      Past Medical History:  Diagnosis Date   Back pain    Bladder cancer (HCC)    COVID-19 09/10/2020   Received Casirivimab/Irdevimab infusion on 09/11/20   Diabetes mellitus without complication (HCC)    HOH (hard of hearing)    Hypertension    Obesity    Renal cancer (HCC)    Renal cell cancer (HCC)    Throat cancer (HCC)    Tremor      Past Surgical History:  Procedure Laterality Date   APPENDECTOMY     BLADDER SURGERY     CAROTID PTA/STENT INTERVENTION Right 08/26/2021   Procedure: CAROTID PTA/STENT INTERVENTION;  Surgeon: Jackquelyn Mass, MD;  Location: [name];  Service: Cardiovascular;  Laterality: Right;   CATARACT EXTRACTION W/PHACO Right 01/21/2023   Procedure: CATARACT EXTRACTION PHACO AND INTRAOCULAR LENS PLACEMENT (IOC) RIGHT DIABETIC  4.89  00:45.6;  Surgeon: Trudi Fus, MD;  Location: [facility];  Service: Ophthalmology;  Laterality: Right;  Diabetic   CERVICAL SPINE SURGERY     CHOLECYSTECTOMY N/A 03/23/2018   Procedure: LAPAROSCOPIC CHOLECYSTECTOMY;  Surgeon: Eldred Grego, MD;  Location: [name] OR[facility];  Service: General;  Laterality: N/A;   COLONOSCOPY     COLONOSCOPY WITH PROPOFOL  N/A 11/10/2017   Procedure: COLONOSCOPY WITH PROPOFOL ;  Surgeon: Toledo, Alphonsus Jeans, MD;  Location: [name];  Service: Gastroenterology;  Laterality: N/A;   ESOPHAGOGASTRODUODENOSCOPY (EGD) WITH PROPOFOL  N/A 11/10/2017   Procedure: ESOPHAGOGASTRODUODENOSCOPY (EGD) WITH PROPOFOL ;  Surgeon: Toledo, Alphonsus Jeans, MD;  Location: [name];  Service: Gastroenterology;   Laterality: N/A;   HERNIA REPAIR     left od surgery     MICROLARYNGOSCOPY N/A 11/05/2020   Procedure: MICROLARYNGOSCOPY WITH BIOPSIES;  Surgeon: Von Grumbling, MD;  Location: [facility];  Service: ENT;  Laterality: N/A;   NEPHRECTOMY     right kidney nephrectomy   rt elbow surgery     THROAT SURGERY  1990   for cancer removal at [facility]     Current Outpatient Medications  Medication Sig Dispense Refill   acetaminophen  (TYLENOL ) 650 MG CR tablet Take 1,300 mg by mouth every 8 (eight) hours as needed for pain.     amitriptyline  (ELAVIL ) 25 MG tablet TAKE 2 TABLETS BY MOUTH AT BEDTIME 180 tablet 3   aspirin  EC 81 MG tablet Take 81 mg by mouth daily. Swallow whole.     carvedilol  (COREG ) 6.25 MG tablet Take 1 tablet by mouth twice daily 180 tablet 0   cetirizine  (ZYRTEC  ALLERGY) 10 MG tablet Take 1 tablet (10 mg total) by mouth daily. 30 tablet 11   chlorthalidone  (HYGROTON ) 25 MG tablet Take 1 tablet by mouth once daily 90 tablet 0   ciprofloxacin  (CIPRO ) 250 MG tablet Take 1 tablet (250 mg total) by mouth 2 (two) times daily. 10 tablet 0   empagliflozin  (JARDIANCE ) 25 MG TABS tablet Take 1 tablet by mouth once daily 90 tablet 3   fluticasone  (FLONASE ) 50 MCG/ACT nasal spray Use 2 spray(s) in each nostril once  daily 16 g 0   glimepiride  (AMARYL ) 2 MG tablet Take 1 tablet (2 mg total) by mouth 2 (two) times daily. 180 tablet 3   ibuprofen (ADVIL) 200 MG tablet Take 200 mg by mouth as needed.     insulin  NPH-regular Human (NOVOLIN 70/30) (70-30) 100 UNIT/ML injection Inject 30 Units into the skin 2 (two) times daily with a meal.     lisinopril  (ZESTRIL ) 5 MG tablet Take 1 tablet by mouth once daily 90 tablet 0   nitrofurantoin , macrocrystal-monohydrate, (MACROBID ) 100 MG capsule Take 1 capsule (100 mg total) by mouth 2 (two) times daily for 7 days. 14 capsule 0   ONETOUCH ULTRA test strip USE TO CHECK BLOOD SUGAR THREE TIMES DAILY 300 each 0   primidone (MYSOLINE) 50 MG tablet  TAKE 1 TABLET BY MOUTH AT BEDTIME 90 tablet 0   rosuvastatin  (CRESTOR ) 40 MG tablet TAKE 1 TABLET BY MOUTH ONCE DAILY. REPLACES ATORVASTATIN 90 tablet 3   tamsulosin  (FLOMAX ) 0.4 MG CAPS capsule TAKE 1 CAPSULE BY MOUTH ONCE DAILY AFTER BREAKFAST 90 capsule 3   triamcinolone  (KENALOG ) 0.025 % ointment Apply 1 Application topically 2 (two) times daily. 80 g 1   No current facility-administered medications for this visit.    Allergies:   Metformin    Social History:   reports that he quit smoking about 34 years ago. His smoking use included cigarettes. He has never used smokeless tobacco. He reports that he does not currently use alcohol. He reports that he does not use drugs.   Family History:  family history includes CAD in his brother and brother; Diabetes in his brother; Hypertension in his brother.    ROS:     Review of Systems  Constitutional: Negative.   HENT: Negative.    Eyes: Negative.   Respiratory: Negative.    Gastrointestinal: Negative.   Genitourinary: Negative.   Musculoskeletal: Negative.   Skin: Negative.   Neurological: Negative.   Endo/Heme/Allergies: Negative.   Psychiatric/Behavioral: Negative.    All other systems reviewed and are negative.     All other systems are reviewed and negative.    PHYSICAL EXAM: VS:  BP 102/70   Pulse 81   Ht 6' (1.829 m)   Wt 212 lb 9.6 oz (96.4 kg)   SpO2 97%   BMI 28.83 kg/m  , BMI Body mass index is 28.83 kg/m. Last weight:  Wt Readings from Last 3 Encounters:  04/24/24 212 lb 9.6 oz (96.4 kg)  04/13/24 213 lb 6.4 oz (96.8 kg)  03/23/24 214 lb (97.1 kg)     Physical Exam Vitals reviewed.  Constitutional:      Appearance: Normal appearance. He is normal weight.  HENT:     Head: Normocephalic.     Nose: Nose normal.     Mouth/Throat:     Mouth: Mucous membranes are moist.  Eyes:     Pupils: Pupils are equal, round, and reactive to light.  Cardiovascular:     Rate and Rhythm: Normal rate and regular  rhythm.     Pulses: Normal pulses.     Heart sounds: Normal heart sounds.  Pulmonary:     Effort: Pulmonary effort is normal.  Abdominal:     General: Abdomen is flat. Bowel sounds are normal.  Musculoskeletal:        General: Normal range of motion.     Cervical back: Normal range of motion.  Skin:    General: Skin is warm.  Neurological:  General: No focal deficit present.     Mental Status: He is alert.  Psychiatric:        Mood and Affect: Mood normal.       EKG:   Recent Labs: 04/13/2024: ALT 12; BUN 18; Creatinine, Ser 1.13; Hemoglobin 15.7; Platelets 150; Potassium 4.2; Sodium 141    Lipid Panel    Component Value Date/Time   CHOL 131 04/13/2024 1035   TRIG 95 04/13/2024 1035   HDL 41 04/13/2024 1035   CHOLHDL 3.4 10/19/2023 1032   LDLCALC 72 04/13/2024 1035      Other studies Reviewed: Additional studies/ records that were reviewed today include:  Review of the above records demonstrates:       No data to display            ASSESSMENT AND PLAN:    ICD-10-CM   1. Hypertension associated with diabetes (HCC)  E11.59    I15.2     2. Combined hyperlipidemia associated with type 2 diabetes mellitus (HCC)  E11.69    E78.2     3. SOB (shortness of breath)  R06.02     4. Chronic systolic congestive heart failure, NYHA class 1 (HCC)  I50.22    echo has HEFpEF, grade 1 diastolic dysfunction, already on jaurdiance.    5. Coronary artery disease involving native coronary artery of native heart without angina pectoris  I25.10    inferoapical reversibel defect suggestive RCA ischaemia with normal LVEF on echo and stress test. No chest pain, will treat medically.       Problem List Items Addressed This Visit       Cardiovascular and Mediastinum   Coronary artery disease involving native coronary artery of native heart without angina pectoris   Hypertension associated with diabetes (HCC) - Primary     Endocrine   Combined hyperlipidemia  associated with type 2 diabetes mellitus (HCC)   Other Visit Diagnoses       SOB (shortness of breath)         Chronic systolic congestive heart failure, NYHA class 1 (HCC)       echo has HEFpEF, grade 1 diastolic dysfunction, already on jaurdiance.          Disposition:   Return in about 3 months (around 07/24/2024).    Total time spent: 40 minutes  Signed,  Debborah Fairly, MD  04/24/2024 10:19 AM    [facility]

## 2024-04-25 ENCOUNTER — Encounter: Payer: Self-pay | Admitting: Radiology

## 2024-04-25 ENCOUNTER — Ambulatory Visit

## 2024-04-25 ENCOUNTER — Emergency Department

## 2024-04-25 ENCOUNTER — Other Ambulatory Visit: Payer: Self-pay

## 2024-04-25 ENCOUNTER — Observation Stay
Admission: EM | Admit: 2024-04-25 | Discharge: 2024-04-27 | Disposition: A | Discharge status: Home Health | Attending: Internal Medicine | Admitting: Internal Medicine

## 2024-04-25 DIAGNOSIS — M545 Low back pain, unspecified: Secondary | ICD-10-CM | POA: Diagnosis not present

## 2024-04-25 DIAGNOSIS — I10 Essential (primary) hypertension: Secondary | ICD-10-CM

## 2024-04-25 DIAGNOSIS — G250 Essential tremor: Secondary | ICD-10-CM | POA: Diagnosis not present

## 2024-04-25 DIAGNOSIS — E785 Hyperlipidemia, unspecified: Secondary | ICD-10-CM | POA: Diagnosis not present

## 2024-04-25 DIAGNOSIS — W1830XA Fall on same level, unspecified, initial encounter: Secondary | ICD-10-CM | POA: Diagnosis not present

## 2024-04-25 DIAGNOSIS — I110 Hypertensive heart disease with heart failure: Secondary | ICD-10-CM | POA: Diagnosis not present

## 2024-04-25 DIAGNOSIS — Z6828 Body mass index (BMI) 28.0-28.9, adult: Secondary | ICD-10-CM | POA: Insufficient documentation

## 2024-04-25 DIAGNOSIS — E663 Overweight: Secondary | ICD-10-CM

## 2024-04-25 DIAGNOSIS — I951 Orthostatic hypotension: Secondary | ICD-10-CM | POA: Diagnosis not present

## 2024-04-25 DIAGNOSIS — G8929 Other chronic pain: Secondary | ICD-10-CM | POA: Insufficient documentation

## 2024-04-25 DIAGNOSIS — Z1152 Encounter for screening for COVID-19: Secondary | ICD-10-CM | POA: Diagnosis not present

## 2024-04-25 DIAGNOSIS — W19XXXA Unspecified fall, initial encounter: Secondary | ICD-10-CM | POA: Diagnosis not present

## 2024-04-25 DIAGNOSIS — Z794 Long term (current) use of insulin: Secondary | ICD-10-CM | POA: Diagnosis not present

## 2024-04-25 DIAGNOSIS — Z7901 Long term (current) use of anticoagulants: Secondary | ICD-10-CM | POA: Insufficient documentation

## 2024-04-25 DIAGNOSIS — I2510 Atherosclerotic heart disease of native coronary artery without angina pectoris: Secondary | ICD-10-CM | POA: Insufficient documentation

## 2024-04-25 DIAGNOSIS — E1165 Type 2 diabetes mellitus with hyperglycemia: Secondary | ICD-10-CM | POA: Diagnosis not present

## 2024-04-25 DIAGNOSIS — I5032 Chronic diastolic (congestive) heart failure: Secondary | ICD-10-CM | POA: Diagnosis not present

## 2024-04-25 DIAGNOSIS — R0902 Hypoxemia: Secondary | ICD-10-CM | POA: Diagnosis not present

## 2024-04-25 DIAGNOSIS — N401 Enlarged prostate with lower urinary tract symptoms: Secondary | ICD-10-CM

## 2024-04-25 DIAGNOSIS — S3993XA Unspecified injury of pelvis, initial encounter: Secondary | ICD-10-CM | POA: Diagnosis not present

## 2024-04-25 DIAGNOSIS — S3991XA Unspecified injury of abdomen, initial encounter: Secondary | ICD-10-CM | POA: Diagnosis not present

## 2024-04-25 DIAGNOSIS — Z79899 Other long term (current) drug therapy: Secondary | ICD-10-CM | POA: Diagnosis not present

## 2024-04-25 DIAGNOSIS — R55 Syncope and collapse: Secondary | ICD-10-CM | POA: Diagnosis not present

## 2024-04-25 DIAGNOSIS — R0689 Other abnormalities of breathing: Secondary | ICD-10-CM | POA: Diagnosis not present

## 2024-04-25 DIAGNOSIS — G919 Hydrocephalus, unspecified: Secondary | ICD-10-CM | POA: Diagnosis not present

## 2024-04-25 DIAGNOSIS — D696 Thrombocytopenia, unspecified: Secondary | ICD-10-CM

## 2024-04-25 DIAGNOSIS — Z87891 Personal history of nicotine dependence: Secondary | ICD-10-CM | POA: Insufficient documentation

## 2024-04-25 DIAGNOSIS — G319 Degenerative disease of nervous system, unspecified: Secondary | ICD-10-CM | POA: Diagnosis not present

## 2024-04-25 DIAGNOSIS — R531 Weakness: Secondary | ICD-10-CM | POA: Diagnosis not present

## 2024-04-25 DIAGNOSIS — Z8551 Personal history of malignant neoplasm of bladder: Secondary | ICD-10-CM

## 2024-04-25 DIAGNOSIS — M6281 Muscle weakness (generalized): Secondary | ICD-10-CM | POA: Insufficient documentation

## 2024-04-25 DIAGNOSIS — M549 Dorsalgia, unspecified: Secondary | ICD-10-CM

## 2024-04-25 DIAGNOSIS — I959 Hypotension, unspecified: Secondary | ICD-10-CM | POA: Diagnosis present

## 2024-04-25 DIAGNOSIS — N400 Enlarged prostate without lower urinary tract symptoms: Secondary | ICD-10-CM | POA: Insufficient documentation

## 2024-04-25 DIAGNOSIS — R519 Headache, unspecified: Secondary | ICD-10-CM | POA: Diagnosis not present

## 2024-04-25 DIAGNOSIS — M542 Cervicalgia: Secondary | ICD-10-CM | POA: Diagnosis not present

## 2024-04-25 DIAGNOSIS — S299XXA Unspecified injury of thorax, initial encounter: Secondary | ICD-10-CM | POA: Diagnosis not present

## 2024-04-25 DIAGNOSIS — S0990XA Unspecified injury of head, initial encounter: Secondary | ICD-10-CM | POA: Diagnosis not present

## 2024-04-25 DIAGNOSIS — R9089 Other abnormal findings on diagnostic imaging of central nervous system: Secondary | ICD-10-CM | POA: Diagnosis not present

## 2024-04-25 DIAGNOSIS — F32A Depression, unspecified: Secondary | ICD-10-CM | POA: Diagnosis not present

## 2024-04-25 DIAGNOSIS — I6782 Cerebral ischemia: Secondary | ICD-10-CM | POA: Diagnosis not present

## 2024-04-25 DIAGNOSIS — I672 Cerebral atherosclerosis: Secondary | ICD-10-CM | POA: Diagnosis not present

## 2024-04-25 DIAGNOSIS — M47812 Spondylosis without myelopathy or radiculopathy, cervical region: Secondary | ICD-10-CM | POA: Diagnosis not present

## 2024-04-25 LAB — COMPREHENSIVE METABOLIC PANEL WITH GFR
ALT: 23 U/L (ref 0–44)
AST: 30 U/L (ref 15–41)
Albumin: 4 g/dL (ref 3.5–5.0)
Alkaline Phosphatase: 58 U/L (ref 38–126)
Anion gap: 12 (ref 5–15)
BUN: 25 mg/dL — ABNORMAL HIGH (ref 8–23)
CO2: 22 mmol/L (ref 22–32)
Calcium: 11.2 mg/dL — ABNORMAL HIGH (ref 8.9–10.3)
Chloride: 98 mmol/L (ref 98–111)
Creatinine, Ser: 1.1 mg/dL (ref 0.61–1.24)
GFR, Estimated: >60 mL/min (ref >60)
Glucose, Bld: 179 mg/dL — ABNORMAL HIGH (ref 70–99)
Potassium: 4 mmol/L (ref 3.5–5.1)
Sodium: 132 mmol/L — ABNORMAL LOW (ref 135–145)
Total Bilirubin: 1.3 mg/dL — ABNORMAL HIGH (ref 0.0–1.2)
Total Protein: 7.2 g/dL (ref 6.5–8.1)

## 2024-04-25 LAB — RESP PANEL BY RT-PCR (RSV, FLU A&B, COVID)  RVPGX2
Influenza A by PCR: NEGATIVE
Influenza B by PCR: NEGATIVE
Resp Syncytial Virus by PCR: NEGATIVE
SARS Coronavirus 2 by RT PCR: NEGATIVE

## 2024-04-25 LAB — CBC WITH DIFFERENTIAL/PLATELET
Abs Immature Granulocytes: 0.03 10*3/uL (ref 0.00–0.07)
Basophils Absolute: 0 10*3/uL (ref 0.0–0.1)
Basophils Relative: 1 %
Eosinophils Absolute: 0.2 10*3/uL (ref 0.0–0.5)
Eosinophils Relative: 3 %
HCT: 48 % (ref 39.0–52.0)
Hemoglobin: 15.5 g/dL (ref 13.0–17.0)
Immature Granulocytes: 0 %
Lymphocytes Relative: 4 %
Lymphs Abs: 0.3 10*3/uL — ABNORMAL LOW (ref 0.7–4.0)
MCH: 25.4 pg — ABNORMAL LOW (ref 26.0–34.0)
MCHC: 32.3 g/dL (ref 30.0–36.0)
MCV: 78.6 fL — ABNORMAL LOW (ref 80.0–100.0)
Monocytes Absolute: 0.5 10*3/uL (ref 0.1–1.0)
Monocytes Relative: 6 %
Neutro Abs: 6.3 10*3/uL (ref 1.7–7.7)
Neutrophils Relative %: 86 %
Platelets: 112 10*3/uL — ABNORMAL LOW (ref 150–400)
RBC: 6.11 MIL/uL — ABNORMAL HIGH (ref 4.22–5.81)
RDW: 15.8 % — ABNORMAL HIGH (ref 11.5–15.5)
WBC: 7.4 10*3/uL (ref 4.0–10.5)
nRBC: 0 % (ref 0.0–0.2)

## 2024-04-25 LAB — CBG MONITORING, ED: Glucose-Capillary: 83 mg/dL (ref 70–99)

## 2024-04-25 LAB — URINALYSIS, W/ REFLEX TO CULTURE (INFECTION SUSPECTED)
Bacteria, UA: NONE SEEN
Bilirubin Urine: NEGATIVE
Glucose, UA: >=500 mg/dL — AB
Ketones, ur: NEGATIVE mg/dL
Leukocytes,Ua: NEGATIVE
Nitrite: NEGATIVE
Protein, ur: NEGATIVE mg/dL
Specific Gravity, Urine: 1.044 — ABNORMAL HIGH (ref 1.005–1.030)
pH: 7 (ref 5.0–8.0)

## 2024-04-25 LAB — BRAIN NATRIURETIC PEPTIDE: B Natriuretic Peptide: 36.4 pg/mL (ref 0.0–100.0)

## 2024-04-25 MED ORDER — LISINOPRIL 5 MG PO TABS
5.0000 mg | ORAL_TABLET | Freq: Every day | ORAL | Status: DC
  Administered 2024-04-26: 5 mg via ORAL
  Filled 2024-04-25: qty 1

## 2024-04-25 MED ORDER — VITAMIN D 25 MCG (1000 UNIT) PO TABS
2000.0000 [IU] | ORAL_TABLET | Freq: Every day | ORAL | Status: DC
  Administered 2024-04-26 – 2024-04-27 (×2): 2000 [IU] via ORAL
  Filled 2024-04-25 (×2): qty 2

## 2024-04-25 MED ORDER — AMITRIPTYLINE HCL 25 MG PO TABS
50.0000 mg | ORAL_TABLET | Freq: Every day | ORAL | Status: DC
  Administered 2024-04-26 (×2): 50 mg via ORAL
  Filled 2024-04-25: qty 1
  Filled 2024-04-25: qty 2

## 2024-04-25 MED ORDER — IOHEXOL 300 MG/ML  SOLN
100.0000 mL | Freq: Once | INTRAMUSCULAR | Status: AC | PRN
  Administered 2024-04-25: 100 mL via INTRAVENOUS

## 2024-04-25 MED ORDER — PRIMIDONE 50 MG PO TABS
50.0000 mg | ORAL_TABLET | Freq: Every day | ORAL | Status: DC
  Administered 2024-04-26 (×2): 50 mg via ORAL
  Filled 2024-04-25 (×2): qty 1

## 2024-04-25 MED ORDER — OXYCODONE-ACETAMINOPHEN 5-325 MG PO TABS
1.0000 | ORAL_TABLET | ORAL | Status: DC | PRN
  Administered 2024-04-26: 1 via ORAL
  Filled 2024-04-25: qty 1

## 2024-04-25 MED ORDER — DIPHENHYDRAMINE HCL 50 MG/ML IJ SOLN: 12.5000 mg | Freq: Three times a day (TID) | INTRAMUSCULAR | Status: DC | PRN

## 2024-04-25 MED ORDER — INSULIN ASPART 100 UNIT/ML IJ SOLN: 0.0000 [IU] | Freq: Every day | INTRAMUSCULAR | Status: DC

## 2024-04-25 MED ORDER — CARVEDILOL 3.125 MG PO TABS
6.2500 mg | ORAL_TABLET | Freq: Two times a day (BID) | ORAL | Status: DC
  Administered 2024-04-26 (×2): 6.25 mg via ORAL
  Filled 2024-04-25 (×2): qty 1

## 2024-04-25 MED ORDER — TAMSULOSIN HCL 0.4 MG PO CAPS
0.4000 mg | ORAL_CAPSULE | Freq: Every day | ORAL | Status: DC
  Administered 2024-04-26: 0.4 mg via ORAL
  Filled 2024-04-25: qty 1

## 2024-04-25 MED ORDER — INSULIN ASPART 100 UNIT/ML IJ SOLN
0.0000 [IU] | Freq: Three times a day (TID) | INTRAMUSCULAR | Status: DC
  Administered 2024-04-26: 2 [IU] via SUBCUTANEOUS
  Administered 2024-04-26 – 2024-04-27 (×2): 1 [IU] via SUBCUTANEOUS
  Filled 2024-04-25 (×2): qty 1

## 2024-04-25 MED ORDER — ACETAMINOPHEN 325 MG PO TABS
650.0000 mg | ORAL_TABLET | Freq: Four times a day (QID) | ORAL | Status: DC | PRN
  Administered 2024-04-26: 650 mg via ORAL
  Filled 2024-04-25: qty 2

## 2024-04-25 MED ORDER — HYDRALAZINE HCL 20 MG/ML IJ SOLN: 5.0000 mg | INTRAMUSCULAR | Status: DC | PRN

## 2024-04-25 MED ORDER — ROSUVASTATIN CALCIUM 20 MG PO TABS
40.0000 mg | ORAL_TABLET | Freq: Every day | ORAL | Status: DC
  Administered 2024-04-26 – 2024-04-27 (×2): 40 mg via ORAL
  Filled 2024-04-25 (×2): qty 2

## 2024-04-25 MED ORDER — FLUTICASONE PROPIONATE 50 MCG/ACT NA SUSP
1.0000 | Freq: Every day | NASAL | Status: DC
  Filled 2024-04-25 (×2): qty 16

## 2024-04-25 MED ORDER — SODIUM CHLORIDE 0.9 % IV BOLUS
500.0000 mL | Freq: Once | INTRAVENOUS | Status: AC
  Administered 2024-04-25: 500 mL via INTRAVENOUS

## 2024-04-25 MED ORDER — ASPIRIN 81 MG PO TBEC
81.0000 mg | DELAYED_RELEASE_TABLET | Freq: Every day | ORAL | Status: DC
  Administered 2024-04-26 – 2024-04-27 (×2): 81 mg via ORAL
  Filled 2024-04-25 (×2): qty 1

## 2024-04-25 MED ORDER — ONDANSETRON HCL 4 MG/2ML IJ SOLN: 4.0000 mg | Freq: Three times a day (TID) | INTRAMUSCULAR | Status: DC | PRN

## 2024-04-25 MED ORDER — INSULIN ASPART PROT & ASPART (70-30 MIX) 100 UNIT/ML ~~LOC~~ SUSP
15.0000 [IU] | Freq: Two times a day (BID) | SUBCUTANEOUS | Status: DC
  Administered 2024-04-26: 15 [IU] via SUBCUTANEOUS
  Filled 2024-04-25 (×2): qty 10

## 2024-04-25 MED ORDER — LORATADINE 10 MG PO TABS
10.0000 mg | ORAL_TABLET | Freq: Every day | ORAL | Status: DC
  Administered 2024-04-26 (×2): 10 mg via ORAL
  Filled 2024-04-25 (×2): qty 1

## 2024-04-25 MED ORDER — KETOROLAC TROMETHAMINE 15 MG/ML IJ SOLN
7.5000 mg | Freq: Once | INTRAMUSCULAR | Status: AC
  Administered 2024-04-25: 7.5 mg via INTRAVENOUS
  Filled 2024-04-25: qty 1

## 2024-04-25 MED ORDER — MORPHINE SULFATE (PF) 4 MG/ML IV SOLN
4.0000 mg | Freq: Once | INTRAVENOUS | Status: AC
  Administered 2024-04-25: 4 mg via INTRAVENOUS
  Filled 2024-04-25: qty 1

## 2024-04-25 NOTE — ED Provider Notes (Signed)
 Procedures     ----------------------------------------- 4:15 PM on 04/25/2024 -----------------------------------------  CT head cervical spine and chest abdomen pelvis are all unremarkable.  Patient and his spouse now provide additional information including that he is currently being treated for a UTI with antibiotics by Dr. Meredeth Stallion.  Also reports that his 2 falls recently were both in the setting of getting lightheaded while standing and syncope, and he has not had anything to eat or drink since 6:30 PM yesterday.  Also reports that he had a fever a week ago and has had ongoing chills and night sweats.  Multiple people in their church are currently sick with what seems to be viral illnesses, and his wife became ill a few days after the patient did during this past week as well.  Will check a COVID/flu swab, check urinalysis to look for signs of a resistant UTI, give 500 IV saline and check orthostatics.   ----------------------------------------- 6:09 PM on 04/25/2024 ----------------------------------------- COVID and flu negative, urinalysis does not show signs of infection.  After fluid challenge, orthostatic vital signs are normal, but patient is very dizzy and unable to maintain balance when he stands up.  Will check MRI to look for possible recent posterior infarct.  ----------------------------------------- 9:16 PM on 04/25/2024 ----------------------------------------- MRI negative.  Patient received an additional 500 mL bolus.  Still unable to get up right without being severely dizzy.  Will need to hospitalize for further evaluation.   ----------------------------------------- 10:11 PM on 04/25/2024 ----------------------------------------- Case discussed with hospitalist   Jacquie Maudlin, MD 04/25/24 2211

## 2024-04-25 NOTE — ED Provider Notes (Addendum)
 [facility] Provider Note    Event Date/Time   First MD Initiated Contact with Patient 04/25/24 1143     (approximate)   History   Fall   HPI  [name] is a 78 year old male presenting to the emergency department for evaluation after a fall.  Patient stepped up to try and get in his car, but did not make it all the way up and ended up falling backwards and hitting the back of his head as well as his back.  No LOC.  Not on anticoagulation.  Currently reporting pain in his head, neck, back, abdomen.     Physical Exam   Triage Vital Signs: ED Triage Vitals  Encounter Vitals Group     BP 04/25/24 1149 (!) 172/66     Systolic BP Percentile --      Diastolic BP Percentile --      Pulse Rate 04/25/24 1149 94     Resp 04/25/24 1149 (!) 25     Temp 04/25/24 1149 98.5 F (36.9 C)     Temp src --      SpO2 04/25/24 1149 95 %     Weight --      Height --      Head Circumference --      Peak Flow --      Pain Score 04/25/24 1152 7     Pain Loc --      Pain Education --      Exclude from Growth Chart --     Most recent vital signs: Vitals:   04/25/24 1149 04/25/24 1330  BP: (!) 172/66 (!) 149/70  Pulse: 94 92  Resp: (!) 25 (!) 30  Temp: 98.5 F (36.9 C)   SpO2: 95% 92%    Nursing notes and vital signs reviewed.  General: Adult male, laying in bed, awake and interactive Head: Atraumatic Back: Tenderness throughout the the back including over the midline, but no focal area of point tenderness Chest: Symmetric chest rise, no tenderness to palpation.  Cardiac: Regular rhythm and rate.  Respiratory: Lungs clear to auscultation Abdomen: Soft, nondistended.  Diffuse tenderness to palpation  Pelvis: Stable in AP and lateral compression. No tenderness to palpation. MSK: No deformity to bilateral upper and lower extremity. Full range of motion to bilateral upper lower extremity. Neuro: Alert, oriented. GCS 15. 5 out of 5 strength in bilateral  upper and lower extremities. Normal sensation to light touch in bilateral upper and lower extremity. Skin: No evidence of burns or lacerations.   ED Results / Procedures / Treatments   Labs (all labs ordered are listed, but only abnormal results are displayed) Labs Reviewed  CBC WITH DIFFERENTIAL/PLATELET - Abnormal; Notable for the following components:      Result Value   RBC 6.11 (*)    MCV 78.6 (*)    MCH 25.4 (*)    RDW 15.8 (*)    Platelets 112 (*)    Lymphs Abs 0.3 (*)    All other components within normal limits  COMPREHENSIVE METABOLIC PANEL WITH GFR - Abnormal; Notable for the following components:   Sodium 132 (*)    Glucose, Bld 179 (*)    BUN 25 (*)    Calcium  11.2 (*)    Total Bilirubin 1.3 (*)    All other components within normal limits     EKG EKG independently reviewed interpreted by myself (ER attending) demonstrates:  EKG demonstrates sinus rhythm rate of 95, PR 206,  QRS 179, QTc 503, right bundle branch block and left posterior fascicular block noted with nonspecific ST changes similar to prior  RADIOLOGY Imaging independently reviewed and interpreted by myself demonstrates:  CT imaging pending  Formal Radiology Read:  CT Head Wo Contrast Result Date: 04/25/2024 CLINICAL DATA:  Weakness, fell, neck pain and headache EXAM: CT HEAD WITHOUT CONTRAST TECHNIQUE: Contiguous axial images were obtained from the base of the skull through the vertex without intravenous contrast. RADIATION DOSE REDUCTION: This exam was performed according to the departmental dose-optimization program which includes automated exposure control, adjustment of the mA and/or kV according to patient size and/or use of iterative reconstruction technique. COMPARISON:  None Available. FINDINGS: Brain: No acute infarct or hemorrhage. Lateral ventricles and midline structures are unremarkable. No acute extra-axial fluid collections. No mass effect. Vascular: Atherosclerosis throughout the  vertebrobasilar system and bilateral internal carotid arteries. No hyperdense vessel. Skull: Normal. Negative for fracture or focal lesion. Sinuses/Orbits: Partial opacification of the right frontal sinus and bilateral ethmoid air cells. Other: None. IMPRESSION: 1. No acute intracranial process. Electronically Signed   By: Bobbye Burrow M.D.   On: 04/25/2024 15:12    PROCEDURES:  Critical Care performed: No  Procedures   MEDICATIONS ORDERED IN ED: Medications  morphine  (PF) 4 MG/ML injection 4 mg (4 mg Intravenous Given 04/25/24 1251)  iohexol  (OMNIPAQUE ) 300 MG/ML solution 100 mL (100 mLs Intravenous Contrast Given 04/25/24 1340)     IMPRESSION / MDM / ASSESSMENT AND PLAN / ED COURSE  I reviewed the triage vital signs and the nursing notes.  Differential diagnosis includes, but is not limited to, intracranial bleed, spine fracture, pneumothorax, rib fracture, intra-abdominal injury  Patient's presentation is most consistent with acute presentation with potential threat to life or bodily function.  78 year old male presenting after a ground-level fall.  Multiple areas of tenderness on exam.  With age, CT head, spine, chest, abdomen pelvis were ordered.  Labs without critical derangement.  Signed out to oncoming physician pending completion of these and disposition.  If no significant traumatic injuries are noted, suspect patient may be stable for discharge.     FINAL CLINICAL IMPRESSION(S) / ED DIAGNOSES   Final diagnoses:  Closed head injury, initial encounter  Ground-level fall  Acute back pain, unspecified back location, unspecified back pain laterality     Rx / DC Orders   ED Discharge Orders     None        Note:  This document was prepared using Dragon voice recognition software and may include unintentional dictation errors.   Claria Crofts, MD 04/25/24 1515    Claria Crofts, MD 04/25/24 [phone]

## 2024-04-25 NOTE — ED Notes (Signed)
 This RN attempted to ambulate pt.. pt had a very difficult time getting to the edge of the bed independently, this RN moderately assisted pt to the edge of the bed and the pt was unable to hold himself up. Pt states he feels very weak and "winded" - provider notified.

## 2024-04-25 NOTE — ED Triage Notes (Signed)
 BIBEMS, coming from home. Pt felt weakness and fell. Hit his mid-back. C/o neck pain and headache. Unsure if he hit his head, no LOC, no deformities/trauma noted. 12 lead possible ST elevation. C-collar placed. VSS. SBP 140s. Denies blood thinners. GCS 15

## 2024-04-25 NOTE — H&P (Signed)
 History and Physical    [name]:[id number] DOB: [birth year] DOA: 04/25/2024  Referring MD/NP/PA:   PCP: Aisha Hove, MD   Patient coming from:  The patient is coming from home.     Chief Complaint: fall  HPI: [name] is a 78 y.o. male with medical history significant of hypertension, hyperlipidemia, diabetes mellitus, CAD, diastolic CHF, depression, BPH, essential tremor, thrombocytopenia, renal cell carcinoma (s/p of right nephrectomy), throat cancer, bladder cancer (s/p of partial bladder resection), chronic hypercalcemia, who presents with fall.   Patient states that he has generalized weakness and lightheadedness in the past several days.  He fell twice today.  Pt states that he stepped up and try to get in his car, but did not make it all the way up and ended up falling backwards and hitting the back of his head as well as his back. No LOC.  No urinary numbness or tingling in extremities.  No facial droop or slurred speech. He has chronic lower back pain which is worse now. He also has neck pain and headache.  Denies chest pain or SOB.  He has mild dry cough.  No nausea, vomiting, diarrhea or abdominal pain. He was recently treated for UTI, and just finished a course of Macrobid .  Today he does not have symptoms of a UTI.   Data reviewed independently and ED Course: pt was found to have WBC 7.4, negative UA, GFR > 60, calcium  11.2, negative PCR for COVID, flu and RSV. Temperature normal, blood pressure 184/72, heart rate 95, 88, RR 30, oxygen saturation 92-96% on room air.  CT of head and CT of C-spine are negative for acute injury. MRI of brain negative for acute intracranial abnormalities.  Patient is placed in telemetry bed for observation.  CT scan of chest/abdomen/pelvis: 1. No acute traumatic findings in the chest, abdomen, or pelvis. 2. Focal nodular soft tissue thickening along the right bladder wall, not definitively visualized on the prior PET-CT  dated 11/20/2020. Recommend correlation with clinical history and interval imaging, if available. Further evaluation with cystoscopy could be considered. 3. Status post right nephrectomy. 4. Large right lateral abdominal wall hernia is again noted containing portions of the liver, right colon, and mid to distal small bowel. No evidence of obstruction. 5. Colonic diverticulosis without evidence of acute diverticulitis. 6. Prostatomegaly. 7. Aortic Atherosclerosis (ICD10-I70.0) and Emphysema (ICD10-J43.9).    EKG: I have personally reviewed. Sinus rhythm, QTc 503, right axis deviation, right bundle blockage   Review of Systems:   General: no fevers, has chills, no body weight gain, has fatigue HEENT: no blurry vision, hearing changes or sore throat Respiratory: no dyspnea, has mild dry coughing, no wheezing CV: no chest pain, no palpitations GI: no nausea, vomiting, abdominal pain, diarrhea, constipation GU: no dysuria, burning on urination, increased urinary frequency, hematuria  Ext: no leg edema Neuro: no unilateral weakness, numbness, or tingling, no vision change or hearing loss. Has fall and lightheadedness. Has essential hand tremor Skin: no rash, no skin tear. MSK: No muscle spasm, no deformity, no limitation of range of movement in spin.  Has lower back pain, neck pain Heme: No easy bruising.  Travel history: No recent long distant travel.   Allergy:  Allergies  Allergen Reactions   Metformin Diarrhea    Past Medical History:  Diagnosis Date   Back pain    Bladder cancer (HCC)    COVID-19 09/10/2020   Received Casirivimab/Irdevimab infusion on 09/11/20   Diabetes mellitus without  complication (HCC)    HOH (hard of hearing)    Hypertension    Obesity    Renal cancer (HCC)    Renal cell cancer (HCC)    Throat cancer (HCC)    Tremor     Past Surgical History:  Procedure Laterality Date   APPENDECTOMY     BLADDER SURGERY     CAROTID PTA/STENT INTERVENTION  Right 08/26/2021   Procedure: CAROTID PTA/STENT INTERVENTION;  Surgeon: Jackquelyn Mass, MD;  Location: [name] IN[facility]IVE [facility];  Service: Cardiovascular;  Laterality: Right;   CATARACT EXTRACTION W/PHACO Right 01/21/2023   Procedure: CATARACT EXTRACTION PHACO AND INTRAOCULAR LENS PLACEMENT (IOC) RIGHT DIABETIC  4.89  00:45.6;  Surgeon: Trudi Fus, MD;  Location: [facility];  Service: Ophthalmology;  Laterality: Right;  Diabetic   CERVICAL SPINE SURGERY     CHOLECYSTECTOMY N/A 03/23/2018   Procedure: LAPAROSCOPIC CHOLECYSTECTOMY;  Surgeon: Eldred Grego, MD;  Location: [name] ORS;  Service: General;  Laterality: N/A;   COLONOSCOPY     COLONOSCOPY WITH PROPOFOL  N/A 11/10/2017   Procedure: COLONOSCOPY WITH PROPOFOL ;  Surgeon: Toledo, Alphonsus Jeans, MD;  Location: [name] ENDOSCOPY;  Service: Gastroenterology;  Laterality: N/A;   ESOPHAGOGASTRODUODENOSCOPY (EGD) WITH PROPOFOL  N/A 11/10/2017   Procedure: ESOPHAGOGASTRODUODENOSCOPY (EGD) WITH PROPOFOL ;  Surgeon: Toledo, Alphonsus Jeans, MD;  Location: [name] ENDOSCOPY;  Service: Gastroenterology;  Laterality: N/A;   HERNIA REPAIR     left od surgery     MICROLARYNGOSCOPY N/A 11/05/2020   Procedure: MICROLARYNGOSCOPY WITH BIOPSIES;  Surgeon: Von Grumbling, MD;  Location: [facility];  Service: ENT;  Laterality: N/A;   NEPHRECTOMY     right kidney nephrectomy   rt elbow surgery     THROAT SURGERY  1990   for cancer removal at [facility]    Social History:  reports that he quit smoking about 34 years ago. His smoking use included cigarettes. He has never used smokeless tobacco. He reports that he does not currently use alcohol. He reports that he does not use drugs.  Family History:  Family History  Problem Relation Age of Onset   Diabetes Brother    Hypertension Brother    CAD Brother    CAD Brother      Prior to Admission medications   Medication Sig Start Date End Date Taking? Authorizing Provider  acetaminophen   (TYLENOL ) 650 MG CR tablet Take 1,300 mg by mouth every 8 (eight) hours as needed for pain.    [provider]  amitriptyline  (ELAVIL ) 25 MG tablet TAKE 2 TABLETS BY MOUTH AT BEDTIME 02/07/24   Aisha Hove, MD  aspirin  EC 81 MG tablet Take 81 mg by mouth daily. Swallow whole.    [provider]  carvedilol  (COREG ) 6.25 MG tablet Take 1 tablet by mouth twice daily 03/27/24   Cherrie Cornwall, MD  cetirizine  (ZYRTEC  ALLERGY) 10 MG tablet Take 1 tablet (10 mg total) by mouth daily. 10/28/23   Trenda Frisk, FNP  chlorthalidone  (HYGROTON ) 25 MG tablet Take 1 tablet by mouth once daily 02/07/24   Cherrie Cornwall, MD  ciprofloxacin  (CIPRO ) 250 MG tablet Take 1 tablet (250 mg total) by mouth 2 (two) times daily. 04/13/24   Aisha Hove, MD  empagliflozin  (JARDIANCE ) 25 MG TABS tablet Take 1 tablet by mouth once daily 02/01/24   Aisha Hove, MD  fluticasone  (FLONASE ) 50 MCG/ACT nasal spray Use 2 spray(s) in each nostril once daily 04/25/24   Trenda Frisk, FNP  glimepiride  (AMARYL )  2 MG tablet Take 1 tablet (2 mg total) by mouth 2 (two) times daily. 07/13/23 07/12/24  Aisha Hove, MD  ibuprofen (ADVIL) 200 MG tablet Take 200 mg by mouth as needed.    [provider]  insulin  NPH-regular Human (NOVOLIN 70/30) (70-30) 100 UNIT/ML injection Inject 30 Units into the skin 2 (two) times daily with a meal.    [provider]  lisinopril  (ZESTRIL ) 5 MG tablet Take 1 tablet by mouth once daily 03/27/24   Cherrie Cornwall, MD  [facility] ULTRA test strip USE TO CHECK BLOOD SUGAR THREE TIMES DAILY 01/27/24   Aisha Hove, MD  primidone (MYSOLINE) 50 MG tablet TAKE 1 TABLET BY MOUTH AT BEDTIME 02/05/24   Shirley, Amanda M, FNP  rosuvastatin  (CRESTOR ) 40 MG tablet TAKE 1 TABLET BY MOUTH ONCE DAILY. REPLACES ATORVASTATIN 11/23/23   Aisha Hove, MD  tamsulosin  (FLOMAX ) 0.4 MG CAPS capsule TAKE 1 CAPSULE BY MOUTH ONCE DAILY AFTER BREAKFAST 09/30/23   Aisha Hove, MD   triamcinolone  (KENALOG ) 0.025 % ointment Apply 1 Application topically 2 (two) times daily. 06/25/23   Aisha Hove, MD    Physical Exam: Vitals:   04/25/24 1748 04/25/24 1800 04/25/24 1830 04/25/24 2030  BP: (!) 147/67 (!) 169/77 (!) 175/85 (!) 184/72  Pulse: 95 94 95 88  Resp: 16   (!) 24  Temp:      TempSrc:      SpO2: 96% 94% 94% 96%   General: Not in acute distress HEENT:       Eyes: PERRL, EOMI, no jaundice       ENT: No discharge from the ears and nose, no pharynx injection, no tonsillar enlargement.        Neck: No JVD, no bruit, no mass felt. Heme: No neck lymph node enlargement. Cardiac: S1/S2, RRR, No murmurs, No gallops or rubs. Respiratory: No rales, wheezing, rhonchi or rubs. GI: Soft, nondistended, nontender, no rebound pain, no organomegaly, BS present. GU: No hematuria Ext: No pitting leg edema bilaterally. 1+DP/PT pulse bilaterally. Musculoskeletal: No joint deformities, No joint redness or warmth, no limitation of ROM in spin. Skin: No rashes.  Neuro: Alert, oriented X3, cranial nerves II-XII grossly intact, moves all extremities.  Has hand tremors Psych: Patient is not psychotic, no suicidal or hemocidal ideation.  Labs on Admission: I have personally reviewed following labs and imaging studies  CBC: Recent Labs  Lab 04/25/24 1154  WBC 7.4  NEUTROABS 6.3  HGB 15.5  HCT 48.0  MCV 78.6*  PLT 112*   Basic Metabolic Panel: Recent Labs  Lab 04/25/24 1154  NA 132*  K 4.0  CL 98  CO2 22  GLUCOSE 179*  BUN 25*  CREATININE 1.10  CALCIUM  11.2*   GFR: Estimated Creatinine Clearance: 69.8 mL/min (by C-G formula based on SCr of 1.1 mg/dL). Liver Function Tests: Recent Labs  Lab 04/25/24 1154  AST 30  ALT 23  ALKPHOS 58  BILITOT 1.3*  PROT 7.2  ALBUMIN 4.0   No results for input(s): "LIPASE", "AMYLASE" in the last 168 hours. No results for input(s): "AMMONIA" in the last 168 hours. Coagulation Profile: No results for input(s): "INR",  "PROTIME" in the last 168 hours. Cardiac Enzymes: No results for input(s): "CKTOTAL", "CKMB", "CKMBINDEX", "TROPONINI" in the last 168 hours. BNP (last 3 results) No results for input(s): "PROBNP" in the last 8760 hours. HbA1C: No results for input(s): "HGBA1C" in the last 72 hours. CBG: Recent Labs  Lab 04/25/24 2228  GLUCAP 83   Lipid Profile: No results for input(s): "CHOL", "HDL", "LDLCALC", "TRIG", "CHOLHDL", "LDLDIRECT" in the last 72 hours. Thyroid  Function Tests: No results for input(s): "TSH", "T4TOTAL", "FREET4", "T3FREE", "THYROIDAB" in the last 72 hours. Anemia Panel: No results for input(s): "VITAMINB12", "FOLATE", "FERRITIN", "TIBC", "IRON", "RETICCTPCT" in the last 72 hours. Urine analysis:    Component Value Date/Time   COLORURINE YELLOW (A) 04/25/2024 1621   APPEARANCEUR CLEAR (A) 04/25/2024 1621   LABSPEC 1.044 (H) 04/25/2024 1621   PHURINE 7.0 04/25/2024 1621   GLUCOSEU >=500 (A) 04/25/2024 1621   HGBUR SMALL (A) 04/25/2024 1621   BILIRUBINUR NEGATIVE 04/25/2024 1621   BILIRUBINUR Negative 04/13/2024 1015   KETONESUR NEGATIVE 04/25/2024 1621   PROTEINUR NEGATIVE 04/25/2024 1621   UROBILINOGEN 0.2 04/13/2024 1015   NITRITE NEGATIVE 04/25/2024 1621   LEUKOCYTESUR NEGATIVE 04/25/2024 1621   Sepsis Labs: @LABRCNTIP (procalcitonin:4,lacticidven:4) ) Recent Results (from the past 240 hours)  Resp panel by RT-PCR (RSV, Flu A&B, Covid) Anterior Nasal Swab     Status: None   Collection Time: 04/25/24  4:21 PM   Specimen: Anterior Nasal Swab  Result Value Ref Range Status   SARS Coronavirus 2 by RT PCR NEGATIVE NEGATIVE Final    Comment: (NOTE) SARS-CoV-2 target nucleic acids are NOT DETECTED.  The SARS-CoV-2 RNA is generally detectable in upper respiratory specimens during the acute phase of infection. The lowest concentration of SARS-CoV-2 viral copies this assay can detect is 138 copies/mL. A negative result does not preclude SARS-Cov-2 infection and  should not be used as the sole basis for treatment or other patient management decisions. A negative result may occur with  improper specimen collection/handling, submission of specimen other than nasopharyngeal swab, presence of viral mutation(s) within the areas targeted by this assay, and inadequate number of viral copies(<138 copies/mL). A negative result must be combined with clinical observations, patient history, and epidemiological information. The expected result is Negative.  Fact Sheet for Patients:  BloggerCourse.com  Fact Sheet for Healthcare Providers:  SeriousBroker.it  This test is no t yet approved or cleared by the [place]  FDA and  has been authorized for detection and/or diagnosis of SARS-CoV-2 by FDA under an Emergency Use Authorization (EUA). This EUA will remain  in effect (meaning this test can be used) for the duration of the COVID-19 declaration under Section 564(b)(1) of the Act, 21 U.S.C.section 360bbb-3(b)(1), unless the authorization is terminated  or revoked sooner.       Influenza A by PCR NEGATIVE NEGATIVE Final   Influenza B by PCR NEGATIVE NEGATIVE Final    Comment: (NOTE) The Xpert Xpress SARS-CoV-2/FLU/RSV plus assay is intended as an aid in the diagnosis of influenza from Nasopharyngeal swab specimens and should not be used as a sole basis for treatment. Nasal washings and aspirates are unacceptable for Xpert Xpress SARS-CoV-2/FLU/RSV testing.  Fact Sheet for Patients: BloggerCourse.com  Fact Sheet for Healthcare Providers: SeriousBroker.it  This test is not yet approved or cleared by the [place]  FDA and has been authorized for detection and/or diagnosis of SARS-CoV-2 by FDA under an Emergency Use Authorization (EUA). This EUA will remain in effect (meaning this test can be used) for the duration of the COVID-19 declaration  under Section 564(b)(1) of the Act, 21 U.S.C. section 360bbb-3(b)(1), unless the authorization is terminated or revoked.     Resp Syncytial Virus by PCR NEGATIVE NEGATIVE Final    Comment: (NOTE) Fact Sheet for Patients: BloggerCourse.com  Fact Sheet for Healthcare Providers: SeriousBroker.it  This test  is not yet approved or cleared by the [place]  FDA and has been authorized for detection and/or diagnosis of SARS-CoV-2 by FDA under an Emergency Use Authorization (EUA). This EUA will remain in effect (meaning this test can be used) for the duration of the COVID-19 declaration under Section 564(b)(1) of the Act, 21 U.S.C. section 360bbb-3(b)(1), unless the authorization is terminated or revoked.  Performed at [facility], [address], [place], [place] [address]      Radiological Exams on Admission:   Assessment/Plan Principal Problem:   Fall Active Problems:   Essential hypertension, benign   CAD (coronary artery disease)   Hyperlipidemia   Chronic diastolic CHF (congestive heart failure) (HCC)   Hypercalcemia   Hx of bladder cancer   Benign prostatic hyperplasia   Thrombocytopenia (HCC)   Benign essential tremor   Overweight (BMI 25.0-29.9)   Assessment and Plan:  Fall: Likely multifactorial etiology, including weakness from recent UTI, essential tremor, possible dehydration.  Patient complains of lightheadedness, no focal neurodeficit on physical examination.  MRI of brain negative for stroke.  -Place in telemetry bed of observation - Fall precaution - PT/OT - Hold hygroton  - IV fluid: 500 cc normal saline x 2  Essential hypertension, benign -Hold Hygroton  as above - Coreg , lisinopril  - IV hydralazine  as needed  CAD (coronary artery disease) -Aspirin , Crestor   Hyperlipidemia -Crestor   Chronic diastolic CHF (congestive heart failure) (HCC): Recent 2D echo on 03/25/2018 showed EF  55-60%.  Patient does not have leg edema or JVD.  CHF is compensated. -Check BNP  Hypercalcemia: This is chronic issue.  Patient had calcium  12.0 on 04/13/24.  Today his calcium  is 11.2.  Etiology is not clear.  Hygroton  use may have partially contributed - Hold Hygroton  -IV fluid as above  Hx of bladder cancer: S/p for partial bladder resection and BCG treatment.  Today CT scan showed focal nodular soft tissue thickening along the right bladder wall, not definitively visualized on the prior PET-CT dated 11/24/202 - I recommended the patient to follow-up with urology.  Patient states that he is currently not following up with any urologist.  He will need referral from his PCP.  I strongly recommended patient to ask his PCP to give him referral for urologist follow-up.  Benign prostatic hyperplasia -Flomax   Thrombocytopenia (HCC): This is chronic issue.  Platelet 112 today.  No bleeding -Follow-up by CBC  Benign essential tremor -Primidone  Overweight (BMI 25.0-29.9): Body weight 96.4 kg, BMI 28.83 - Encourage losing weight - Exercise and healthy diet        DVT ppx: SCD  Code Status: Full code     Family Communication:  Yes, patient's wife   at bed side.     Disposition Plan:  Anticipate discharge back to previous environment  Consults called: none   Admission status and Level of care: Telemetry Medical:    for obs     Dispo: The patient is from: Home              Anticipated d/c is to: Home              Anticipated d/c date is: 1 day              Patient currently is not medically stable to d/c.    Severity of Illness:  The appropriate patient status for this patient is OBSERVATION. Observation status is judged to be reasonable and necessary in order to provide the required intensity of service to ensure  the patient's safety. The patient's presenting symptoms, physical exam findings, and initial radiographic and laboratory data in the context of their medical  condition is felt to place them at decreased risk for further clinical deterioration. Furthermore, it is anticipated that the patient will be medically stable for discharge from the hospital within 2 midnights of admission.        Date of Service 04/25/2024    Fidencio Hue [facility]   If 7PM-7AM, please contact night-coverage [email or web address] 04/25/2024, 11:51 PM

## 2024-04-26 ENCOUNTER — Encounter: Payer: Self-pay | Admitting: Internal Medicine

## 2024-04-26 DIAGNOSIS — I951 Orthostatic hypotension: Secondary | ICD-10-CM | POA: Diagnosis not present

## 2024-04-26 LAB — GLUCOSE, CAPILLARY
Glucose-Capillary: 129 mg/dL — ABNORMAL HIGH (ref 70–99)
Glucose-Capillary: 157 mg/dL — ABNORMAL HIGH (ref 70–99)
Glucose-Capillary: 150 mg/dL — ABNORMAL HIGH (ref 70–99)

## 2024-04-26 LAB — BASIC METABOLIC PANEL WITH GFR
Anion gap: 12 (ref 5–15)
BUN: 25 mg/dL — ABNORMAL HIGH (ref 8–23)
CO2: 21 mmol/L — ABNORMAL LOW (ref 22–32)
Calcium: 10.2 mg/dL (ref 8.9–10.3)
Chloride: 100 mmol/L (ref 98–111)
Creatinine, Ser: 0.96 mg/dL (ref 0.61–1.24)
GFR, Estimated: >60 mL/min (ref >60)
Glucose, Bld: 91 mg/dL (ref 70–99)
Potassium: 3.5 mmol/L (ref 3.5–5.1)
Sodium: 133 mmol/L — ABNORMAL LOW (ref 135–145)

## 2024-04-26 LAB — CBC
HCT: 44 % (ref 39.0–52.0)
Hemoglobin: 14.4 g/dL (ref 13.0–17.0)
MCH: 25.3 pg — ABNORMAL LOW (ref 26.0–34.0)
MCHC: 32.7 g/dL (ref 30.0–36.0)
MCV: 77.3 fL — ABNORMAL LOW (ref 80.0–100.0)
Platelets: 112 10*3/uL — ABNORMAL LOW (ref 150–400)
RBC: 5.69 MIL/uL (ref 4.22–5.81)
RDW: 16 % — ABNORMAL HIGH (ref 11.5–15.5)
WBC: 10.9 10*3/uL — ABNORMAL HIGH (ref 4.0–10.5)
nRBC: 0 % (ref 0.0–0.2)

## 2024-04-26 LAB — CBG MONITORING, ED: Glucose-Capillary: 102 mg/dL — ABNORMAL HIGH (ref 70–99)

## 2024-04-26 MED ORDER — SODIUM CHLORIDE 0.9 % IV SOLN: INTRAVENOUS | Status: AC

## 2024-04-26 NOTE — Evaluation (Signed)
 Occupational Therapy Evaluation Patient Details Name: [name] MRN: [id number] DOB: 1946-05-07 Today's Date: 04/26/2024   History of Present Illness   78 y.o. male with medical history significant of hypertension, hyperlipidemia, diabetes mellitus, CAD, diastolic CHF, depression, BPH, essential tremor, thrombocytopenia, renal cell carcinoma (s/p of right nephrectomy), throat cancer, bladder cancer (s/p of partial bladder resection), chronic hypercalcemia, who presents with fall.  Found to have orthostatic hypotension.   Clinical Impressions [name] was seen for OT evaluation this date. Prior to hospital admission, pt was IND including caregiver for father in law. Pt lives with spouse and father in law in home with ramped entrance. Pt currently requires  MIN A + RW for bed>chair t/f. Mobility limited by positive orthostatics, RN and MD notified. MAX A don B socks in sitting. Pt would benefit from skilled OT to address noted impairments and functional limitations (see below for any additional details). Upon hospital discharge, recommend OT follow up, may progress to home with improvement in orthostatics.  Supine: BP 100/48, HR 70 Sitting: BP 87/53, HR 74 Standing: BP 70/34, HR 77 Reclined in chair: BP 91/52, HR 70     If plan is discharge home, recommend the following:   A little help with walking and/or transfers;A little help with bathing/dressing/bathroom;Help with stairs or ramp for entrance     Functional Status Assessment   Patient has had a recent decline in their functional status and demonstrates the ability to make significant improvements in function in a reasonable and predictable amount of time.     Equipment Recommendations   BSC/3in1;Other (comment) (RW)     Recommendations for Other Services         Precautions/Restrictions   Precautions Precautions: Fall Recall of Precautions/Restrictions: Intact Restrictions Weight Bearing Restrictions Per  Provider Order: No     Mobility Bed Mobility Overal bed mobility: Modified Independent                  Transfers Overall transfer level: Needs assistance Equipment used: Rolling walker (2 wheels) Transfers: Sit to/from Stand, Bed to chair/wheelchair/BSC Sit to Stand: Min assist     Step pivot transfers: Min assist            Balance Overall balance assessment: Needs assistance Sitting-balance support: No upper extremity supported, Feet supported Sitting balance-Leahy Scale: Good     Standing balance support: Bilateral upper extremity supported Standing balance-Leahy Scale: Poor                             ADL either performed or assessed with clinical judgement   ADL Overall ADL's : Needs assistance/impaired                                       General ADL Comments: MIN A + RW for simulated BSC t/f. MAX A don B socks in sitting      Pertinent Vitals/Pain Pain Assessment Pain Assessment: No/denies pain     Extremity/Trunk Assessment Upper Extremity Assessment Upper Extremity Assessment: Overall WFL for tasks assessed   Lower Extremity Assessment Lower Extremity Assessment: Generalized weakness       Communication Communication Communication: No apparent difficulties   Cognition Arousal: Alert Behavior During Therapy: WFL for tasks assessed/performed Cognition: No apparent impairments  Following commands: Intact                  Home Living Family/patient expects to be discharged to:: Private residence Living Arrangements: Spouse/significant other Available Help at Discharge: Family Type of Home: House Home Access: Ramped entrance     Home Layout: One level     Bathroom Shower/Tub: Walk-in shower         Home Equipment: Agricultural consultant (2 wheels);Shower seat          Prior Functioning/Environment Prior Level of Function : Independent/Modified  Independent;Driving             Mobility Comments: no AD use ADLs Comments: caregiver for wife's father (dressing/bathing) and does IADLs 2/2 wifes mobility limitations    OT Problem List: Decreased strength;Decreased range of motion;Decreased activity tolerance;Impaired balance (sitting and/or standing)   OT Treatment/Interventions: Self-care/ADL training;Therapeutic exercise;Energy conservation;DME and/or AE instruction;Therapeutic activities      OT Goals(Current goals can be found in the care plan section)   Acute Rehab OT Goals Patient Stated Goal: to go home OT Goal Formulation: With patient/family Time For Goal Achievement: 05/10/24 Potential to Achieve Goals: Good ADL Goals Pt Will Perform Grooming: with modified independence;standing Pt Will Perform Lower Body Dressing: with modified independence;sit to/from stand Pt Will Transfer to Toilet: with modified independence;ambulating;regular height toilet   OT Frequency:  Min 2X/week    Co-evaluation              AM-PAC OT "6 Clicks" Daily Activity     Outcome Measure Help from another person eating meals?: None Help from another person taking care of personal grooming?: A Little Help from another person toileting, which includes using toliet, bedpan, or urinal?: A Little Help from another person bathing (including washing, rinsing, drying)?: A Little Help from another person to put on and taking off regular upper body clothing?: None Help from another person to put on and taking off regular lower body clothing?: A Little 6 Click Score: 20   End of Session Equipment Utilized During Treatment: Rolling walker (2 wheels) Nurse Communication: Mobility status  Activity Tolerance: Patient tolerated treatment well Patient left: in chair;with call bell/phone within reach;with chair alarm set;with family/visitor present  OT Visit Diagnosis: Other abnormalities of gait and mobility (R26.89);Muscle weakness  (generalized) (M62.81)                Time: 1610-9604 OT Time Calculation (min): 28 min Charges:  OT General Charges $OT Visit: 1 Visit OT Evaluation $OT Eval Low Complexity: 1 Low OT Treatments $Self Care/Home Management : 8-22 mins  [name], M.S. OTR/L  04/26/24, 4:10 PM  ascom [phone]

## 2024-04-26 NOTE — Progress Notes (Addendum)
 Sent to pharmacy for Lantus . Messaged pharmacy again at 1820 for 70/30, nurse mistakenly sent for Lantus .

## 2024-04-26 NOTE — Progress Notes (Signed)
 Progress Note   Patient: [name] ZOX:[id number] DOB: 12-Jun-1946 DOA: 04/25/2024     0 DOS: the patient was seen and examined on 04/26/2024   Brief hospital course:  78 y.o. male with medical history significant of hypertension, hyperlipidemia, diabetes mellitus, CAD, diastolic CHF, depression, BPH, essential tremor, thrombocytopenia, renal cell carcinoma (s/p of right nephrectomy), throat cancer, bladder cancer (s/p of partial bladder resection), chronic hypercalcemia, who presents with fall.  Found to have orthostatic hypotension.  Assessment and Plan:  Orthostatic hypotension with fall at home - Likely multifactorial etiology including weakness and dehydration, recent UTI, medication side effect.  MRI brain to rule out insidious etiology negative.  Orthostatics still showing low BP despite initial fluid resuscitation.  Will restart IV fluid hydration, NS at 100 mL/h.  PT/OT on board.  Patient does not want SNF, likely transition home with home health.  Will hold antihypertensives and Flomax  for now..  Essential hypertension - Holding antihypertensives as of now.  Chronic chronic HFpEF - Does not appear to be in acute exacerbation.  No hypoxia, edema.  Hypercalcemia - Appears to be a chronic issue.  Calcium  11.2 on presentation.  IV fluid hydration on board.  BPH - Holding Flomax  for now given orthostatic hypotension.  Benign essential tremor - Primidone on board.  Physical debilitation muscle weakness - PT/OT pending.  Likely home with home health as patient does not want to pursue SNF.  Subjective: Patient feeling improved this morning.  A bit weak overall but denies any fever, shortness of breath, chest pain, nausea, vomiting, abdominal pain.  Physical Exam: Vitals:   04/26/24 1000 04/26/24 1020 04/26/24 1100 04/26/24 1149  BP: 137/70  (!) 147/68   Pulse: 79  77   Resp: (!) 29  (!) 26 (!) 22  Temp:  (!) 97.5 F (36.4 C) 97.6 F (36.4 C)   TempSrc:  Oral    SpO2:  96%  98%     GENERAL:  Alert, pleasant, no acute distress  HEENT:  EOMI CARDIOVASCULAR:  RRR, no murmurs appreciated RESPIRATORY:  Clear to auscultation, no wheezing, rales, or rhonchi GASTROINTESTINAL:  Soft, nontender, nondistended EXTREMITIES:  No LE edema bilaterally NEURO:  No new focal deficits appreciated SKIN:  No rashes noted PSYCH:  Appropriate mood and affect    Data Reviewed:  There are no new results to review at this time.  Labs: CBC: Recent Labs  Lab 04/25/24 1154 04/26/24 0548  WBC 7.4 10.9*  NEUTROABS 6.3  --   HGB 15.5 14.4  HCT 48.0 44.0  MCV 78.6* 77.3*  PLT 112* 112*   Basic Metabolic Panel: Recent Labs  Lab 04/25/24 1154 04/26/24 0548  NA 132* 133*  K 4.0 3.5  CL 98 100  CO2 22 21*  GLUCOSE 179* 91  BUN 25* 25*  CREATININE 1.10 0.96  CALCIUM  11.2* 10.2   Liver Function Tests: Recent Labs  Lab 04/25/24 1154  AST 30  ALT 23  ALKPHOS 58  BILITOT 1.3*  PROT 7.2  ALBUMIN 4.0   CBG: Recent Labs  Lab 04/25/24 2228 04/26/24 0826 04/26/24 1224  GLUCAP 83 102* 129*    Scheduled Meds:  amitriptyline   50 mg Oral QHS   aspirin  EC  81 mg Oral Daily   carvedilol   6.25 mg Oral BID   cholecalciferol  2,000 Units Oral Daily   fluticasone   1 spray Each Nare Daily   insulin  aspart  0-5 Units Subcutaneous QHS   insulin  aspart  0-9 Units Subcutaneous TID WC  insulin  aspart protamine- aspart  15 Units Subcutaneous BID WC   lisinopril   5 mg Oral Daily   loratadine  10 mg Oral QHS   primidone  50 mg Oral QHS   rosuvastatin   40 mg Oral Daily   tamsulosin   0.4 mg Oral Q breakfast   Continuous Infusions: PRN Meds:.acetaminophen , diphenhydrAMINE , hydrALAZINE , oxyCODONE -acetaminophen   Family Communication: Wife at bedside  Disposition: Status is: Observation The patient remains OBS appropriate and will d/c before 2 midnights.     Time spent: 34 minutes  Author: Jodeane Mulligan, DO 04/26/2024 2:06 PM  For on call review  [email or web address].

## 2024-04-26 NOTE — Hospital Course (Signed)
  78 y.o. male with medical history significant of hypertension, hyperlipidemia, diabetes mellitus, CAD, diastolic CHF, depression, BPH, essential tremor, thrombocytopenia, renal cell carcinoma (s/p of right nephrectomy), throat cancer, bladder cancer (s/p of partial bladder resection), chronic hypercalcemia, who presents with fall.  Found to have orthostatic hypotension.   Assessment and Plan:   Orthostatic hypotension with fall at home - Likely multifactorial etiology including weakness and dehydration, recent UTI, medication side effect.  MRI brain to rule out insidious etiology negative.  Orthostatics still showing low BP despite initial fluid resuscitation.  Will restart IV fluid hydration, NS at 100 mL/h.  PT/OT on board.  Patient does not want SNF, likely transition home with home health.  Will hold antihypertensives and Flomax  for now..   Essential hypertension - Holding antihypertensives as of now.   Chronic chronic HFpEF - Does not appear to be in acute exacerbation.  No hypoxia, edema.   Hypercalcemia - Appears to be a chronic issue.  Calcium  11.2 on presentation.  IV fluid hydration on board.   BPH - Holding Flomax  for now given orthostatic hypotension.   Benign essential tremor - Primidone on board.   Physical debilitation muscle weakness - PT/OT pending.  Likely home with home health as patient does not want to pursue SNF.

## 2024-04-26 NOTE — Progress Notes (Signed)
 PT Cancellation Note  Patient Details Name: [name] MRN: [id number] DOB: 15-Mar-1946   Cancelled Treatment:    Reason Eval/Treat Not Completed: Medical issues which prohibited therapy (Chart reviewed, MD consulted. OT in room on entry, reports pt very orthostatic, no able to tolerate upright for long. WIll defer PT eval to next day.)  1:55 PM, 04/26/24 Dawn Eth, PT, DPT Physical Therapist - [facility]  [phone] (ASCOM)    [name] C 04/26/2024, 1:55 PM

## 2024-04-27 DIAGNOSIS — I951 Orthostatic hypotension: Secondary | ICD-10-CM | POA: Diagnosis not present

## 2024-04-27 LAB — CBC
HCT: 42.5 % (ref 39.0–52.0)
Hemoglobin: 13.8 g/dL (ref 13.0–17.0)
MCH: 25.5 pg — ABNORMAL LOW (ref 26.0–34.0)
MCHC: 32.5 g/dL (ref 30.0–36.0)
MCV: 78.6 fL — ABNORMAL LOW (ref 80.0–100.0)
Platelets: 113 10*3/uL — ABNORMAL LOW (ref 150–400)
RBC: 5.41 MIL/uL (ref 4.22–5.81)
RDW: 16.2 % — ABNORMAL HIGH (ref 11.5–15.5)
WBC: 8.2 10*3/uL (ref 4.0–10.5)
nRBC: 0 % (ref 0.0–0.2)

## 2024-04-27 LAB — BASIC METABOLIC PANEL WITH GFR
Anion gap: 5 (ref 5–15)
BUN: 35 mg/dL — ABNORMAL HIGH (ref 8–23)
CO2: 27 mmol/L (ref 22–32)
Calcium: 9.7 mg/dL (ref 8.9–10.3)
Chloride: 104 mmol/L (ref 98–111)
Creatinine, Ser: 1.11 mg/dL (ref 0.61–1.24)
GFR, Estimated: >60 mL/min (ref >60)
Glucose, Bld: 89 mg/dL (ref 70–99)
Potassium: 3.6 mmol/L (ref 3.5–5.1)
Sodium: 136 mmol/L (ref 135–145)

## 2024-04-27 LAB — GLUCOSE, CAPILLARY
Glucose-Capillary: 105 mg/dL — ABNORMAL HIGH (ref 70–99)
Glucose-Capillary: 141 mg/dL — ABNORMAL HIGH (ref 70–99)

## 2024-04-27 LAB — MAGNESIUM: Magnesium: 2.3 mg/dL (ref 1.7–2.4)

## 2024-04-27 MED ORDER — TECHNETIUM TC 99M SESTAMIBI GENERIC - CARDIOLITE: 10.0000 | Freq: Once | INTRAVENOUS | Status: AC | PRN

## 2024-04-27 NOTE — Care Management Obs Status (Deleted)
 MEDICARE OBSERVATION STATUS NOTIFICATION   Patient Details  Name: [name] MRN: [id number] Date of Birth: 03-16-1946   Medicare Observation Status Notification Given:  Rudolph Cost, CMA 04/27/2024, 10:36 AM

## 2024-04-27 NOTE — TOC Transition Note (Addendum)
 Transition of Care [facility]) - Discharge Note   Patient Details  Name: [name] MRN: [id number] Date of Birth: 10/21/1946  Transition of Care [facility]) CM/SW Contact:  Crayton Docker, RN Phone Number: 04/27/2024, 1:45 PM   Clinical Narrative:     Patient to discharge to home with home health. [facility] following to discharge for home health PT services. [name], Abe Abed will provide transportation and caregiver support.  CM to patient's room regarding pending discharge and [facility] providing home health PT. Per patient and patient's wife, verbalized understanding and agreement.   Final next level of care: Home w Home Health Services Barriers to Discharge: No Barriers Identified   Patient Goals and CMS Choice    Home with home health   Discharge Placement        Home with home health         Discharge Plan and Services Additional resources added to the After Visit Summary for     Discharge Planning Services: CM Consult             Home with home health    [facility] Agency: [facility] Date [facility] Agency Contacted: 04/27/24 Time HH Agency Contacted: 1345 Representative spoke with at [facility] Agency: Louanna Rouse  Social Drivers of Health (SDOH) Interventions SDOH Screenings   Food Insecurity: No Food Insecurity (04/26/2024)  Housing: Low Risk  (04/26/2024)  Transportation Needs: No Transportation Needs (04/26/2024)  Utilities: Not At Risk (04/26/2024)  Depression (PHQ2-9): Low Risk  (05/10/2023)  Social Connections: Socially Integrated (04/26/2024)  Tobacco Use: Medium Risk (04/26/2024)     Readmission Risk Interventions     No data to display

## 2024-04-27 NOTE — Plan of Care (Signed)

## 2024-04-27 NOTE — Care Management Obs Status (Signed)
 MEDICARE OBSERVATION STATUS NOTIFICATION   Patient Details  Name: [name] MRN: [id number] Date of Birth: [birth year]   Medicare Observation Status Notification Given:  Rudolph Cost, CMA 04/27/2024, 11:57 AM

## 2024-04-27 NOTE — TOC Initial Note (Signed)
 Transition of Care [facility]) - Initial/Assessment Note    Patient Details  Name: [name] MRN: [id number] Date of Birth: 1946/01/16  Transition of Care [facility]) CM/SW Contact:    Crayton Docker, RN 04/27/2024, 1:28 PM  Clinical Narrative:                  CM to patient's room regarding TOC screening assessment. CM introduced case management role and discharge care planning process. [name], Abe Abed at side. Patient and patient's wife, amenable to [facility] screening interview. Patient lives at home with patient's wife, father in law and 1 dog. CM and patient discussed home health PT recommendations and DME recommendations for [facility]. Per patient's wife, declined [facility] due to have a [facility]SC at home currently. Per patient's wife, prefers [facility]. CM call to [facility], phone;[phone] regarding home health PT referral. Per Mellody Sprout [facility] cannot accept payor. CM call to [facility] regarding home health PT referral. Per Louanna Rouse, agency has accepted patient for home health PT.  Expected Discharge Plan: Home w Home Health Services Barriers to Discharge: Continued Medical Work up   Patient Goals and CMS Choice    Home with home health   Expected Discharge Plan and Services   Discharge Planning Services: CM Consult   Living arrangements for the past 2 months: Single Family Home                    Home with home health   Prior Living Arrangements/Services Living arrangements for the past 2 months: Single Family Home Lives with:: Spouse, Relatives (father in Social worker) Patient language and need for interpreter reviewed:: No        Need for Family Participation in Patient Care: Yes (Comment) Care giver support system in place?: Yes (comment) Current home services: Other (comment), DME (ramped entrance, walker) Criminal Activity/Legal Involvement Pertinent to Current Situation/Hospitalization: No - Comment as needed  Activities of Daily Living    ADL Screening (condition at time of admission) Independently performs ADLs?: No Does the patient have a NEW difficulty with bathing/dressing/toileting/self-feeding that is expected to last >3 days?: Yes (Initiates electronic notice to provider for possible OT consult) Does the patient have a NEW difficulty with getting in/out of bed, walking, or climbing stairs that is expected to last >3 days?: Yes (Initiates electronic notice to provider for possible PT consult) Does the patient have a NEW difficulty with communication that is expected to last >3 days?: No Is the patient deaf or have difficulty hearing?: No Does the patient have difficulty seeing, even when wearing glasses/contacts?: No Does the patient have difficulty concentrating, remembering, or making decisions?: No  Permission Sought/Granted Permission sought to share information with : Case Manager, Family Supports Permission granted to share information with : Yes, Verbal Permission Granted  Share Information with NAME: Oddis Janssens     Permission granted to share info w Relationship: wife, phone; [phone]  Permission granted to share info w Contact Information: yes  Emotional Assessment Appearance:: Appears stated age Attitude/Demeanor/Rapport: Engaged Affect (typically observed): Calm Orientation: : Oriented to Self, Oriented to Place, Oriented to  Time Alcohol / Substance Use: Not Applicable Psych Involvement: No (comment)  Admission diagnosis:  Fall [W19.XXXA] Closed head injury, initial encounter [S09.90XA] Syncope, unspecified syncope type [R55] Acute back pain, unspecified back location, unspecified back pain laterality [M54.9] Ground-level fall [W18.30XA] Patient Active Problem List   Diagnosis Date Noted   Fall 04/25/2024   Chronic diastolic CHF (  congestive heart failure) (HCC) 04/25/2024   CAD (coronary artery disease) 04/25/2024   Hypercalcemia 04/25/2024   Hx of bladder cancer 04/25/2024    Thrombocytopenia (HCC) 04/25/2024   Overweight (BMI 25.0-29.9) 04/25/2024   Combined hyperlipidemia associated with type 2 diabetes mellitus (HCC) 01/14/2024   Hypertension associated with diabetes (HCC) 10/14/2023   Coronary artery disease involving native coronary artery of native heart without angina pectoris 07/22/2023   Contact dermatitis 06/25/2023   Adrenal mass less than 4 cm in diameter with history of malignant neoplasm (HCC) 04/30/2023   Diabetic polyneuropathy associated with type 2 diabetes mellitus (HCC) 03/01/2023   Skin lesion of face 03/01/2023   Lumbar radiculopathy, chronic 03/01/2023   Carotid stenosis, symptomatic w/o infarct, right 08/26/2021   Pure hypercholesterolemia 08/11/2021   Carotid stenosis 08/10/2021   Benign prostatic hyperplasia 02/11/2021   Essential and other specified forms of tremor 02/11/2021   Laryngeal carcinoma (HCC) 02/11/2021   Type 2 diabetes mellitus with hyperglycemia, without long-term current use of insulin  (HCC) 02/11/2021   Vitamin D  deficiency 02/11/2021   History of 2019 novel coronavirus disease (COVID-19) 11/01/2020   Cholelithiasis with acute cholecystitis 03/22/2018   Dehydration 03/22/2018   Encounter for general adult medical examination without abnormal findings 11/02/2017   Personal history of kidney cancer 09/08/2017   History of colon polyps 04/29/2017   Senile purpura (HCC) 10/29/2016   Bladder neoplasm of uncertain malignant potential 09/01/2016   Essential hypertension, benign 07/04/2014   Ventral hernia 07/04/2014   Obesities, morbid (HCC) 07/04/2014   Insulin  dependent type 2 diabetes mellitus (HCC) 07/04/2014   Hyperlipidemia 07/04/2014   Benign essential tremor 07/04/2014   Backache 07/04/2014   History of malignant neoplasm of ureter 07/07/2013   Cough, persistent 07/07/2013   Nodular prostate with urinary obstruction 10/10/2012   PCP:  Aisha Hove, MD Pharmacy:   [facility] [address]),  [place] - [address] [place]) [place] [address] Phone: [phone] Fax: [phone]     Social Drivers of Health (SDOH) Social History: SDOH Screenings   Food Insecurity: No Food Insecurity (04/26/2024)  Housing: Low Risk  (04/26/2024)  Transportation Needs: No Transportation Needs (04/26/2024)  Utilities: Not At Risk (04/26/2024)  Depression (PHQ2-9): Low Risk  (05/10/2023)  Social Connections: Socially Integrated (04/26/2024)  Tobacco Use: Medium Risk (04/26/2024)   SDOH Interventions:     Readmission Risk Interventions     No data to display

## 2024-04-27 NOTE — Discharge Summary (Signed)
 Physician Discharge Summary   Patient: [name] MRN: [id number] DOB: September 13, 1946  Admit date:     04/25/2024  Discharge date: 04/27/24  Discharge Physician: [name]   PCP: Martin Hove, MD   Recommendations at discharge:    Pt to be discharged home with home health.   If you experience worsening fever, chills, chest pain, shortness of breath, or other concerning symptoms, please call your PCP or go to the emergency department immediately.  Discharge Diagnoses: Principal Problem:   Fall Active Problems:   Essential hypertension, benign   CAD (coronary artery disease)   Hyperlipidemia   Chronic diastolic CHF (congestive heart failure) (HCC)   Hypercalcemia   Hx of bladder cancer   Benign prostatic hyperplasia   Thrombocytopenia (HCC)   Benign essential tremor   Overweight (BMI 25.0-29.9)  Resolved Problems:   * No resolved hospital problems. *   Hospital Course:    78 y.o. male with medical history significant of hypertension, hyperlipidemia, diabetes mellitus, CAD, diastolic CHF, depression, BPH, essential tremor, thrombocytopenia, renal cell carcinoma (s/p of right nephrectomy), throat cancer, bladder cancer (s/p of partial bladder resection), chronic hypercalcemia, who presents with fall.  Found to have orthostatic hypotension.   Assessment and Plan:   Orthostatic hypotension with fall at home - Likely multifactorial etiology including weakness and dehydration, recent UTI, medication side effect.  MRI brain to rule out insidious etiology negative.  Orthostatics showed low BP despite initial fluid resuscitation.  Received continued IV fluid hydration, NS at 100 mL/h.  PT/OT on board.  Patient does not want SNF, likely transition home with home health.  Holding antihypertensives and Flomax  for now.  Orthostatic hypotension appears resolved.  Improved with physical therapy.  Recommending home health PT.    Essential hypertension - Holding antihypertensives as  of now.  Would recommend patient follow-up with primary care physician in the outpatient setting to discuss slowly reinitiating previous antihypertensive regiment as tolerated.   Chronic chronic HFpEF - Does not appear to be in acute exacerbation.  No hypoxia, edema.   Hypercalcemia - Appears to be a chronic issue.  Calcium  11.2 on presentation.  IV fluid hydration on board.   BPH - Holding Flomax  for now given orthostatic hypotension.  Recommend patient follow-up with primary care physician in the outpatient setting to discuss slowly reinitiating Flomax .    benign essential tremor - Primidone  on board.   Physical debilitation muscle weakness - PT recommending home health.  Likely home with home health as patient does not want to pursue SNF.   Consultants: None Procedures performed: None Disposition: Home health Diet recommendation:  Discharge Diet Orders (From admission, onward)     Start     Ordered   04/27/24 0000  Diet - low sodium heart healthy        04/27/24 1341           Cardiac diet  DISCHARGE MEDICATION: Allergies as of 04/27/2024       Reactions   Metformin Diarrhea        Medication List     STOP taking these medications    carvedilol  6.25 MG tablet Commonly known as: COREG    ciprofloxacin  250 MG tablet Commonly known as: Cipro    Jardiance  25 MG Tabs tablet Generic drug: empagliflozin    lisinopril  5 MG tablet Commonly known as: ZESTRIL    nitrofurantoin  (macrocrystal-monohydrate) 100 MG capsule Commonly known as: MACROBID    tamsulosin  0.4 MG Caps capsule Commonly known as: FLOMAX   TAKE these medications    acetaminophen  500 MG tablet Commonly known as: TYLENOL  Take 500 mg by mouth every 6 (six) hours as needed.   amitriptyline  25 MG tablet Commonly known as: ELAVIL  TAKE 2 TABLETS BY MOUTH AT BEDTIME   aspirin  EC 81 MG tablet Take 81 mg by mouth daily. Swallow whole.   cetirizine  10 MG tablet Commonly known as: ZyrTEC   Allergy Take 1 tablet (10 mg total) by mouth daily.   chlorthalidone  25 MG tablet Commonly known as: HYGROTON  Take 1 tablet by mouth once daily   Cholecalciferol  50 MCG (2000 UT) Caps Take 2,000 Units by mouth daily.   fluticasone  50 MCG/ACT nasal spray Commonly known as: FLONASE  Use 2 spray(s) in each nostril once daily   glimepiride  2 MG tablet Commonly known as: Amaryl  Take 1 tablet (2 mg total) by mouth 2 (two) times daily.   insulin  NPH-regular Human (70-30) 100 UNIT/ML injection Inject 30 Units into the skin 2 (two) times daily with a meal.   OneTouch Ultra test strip Generic drug: glucose blood USE TO CHECK BLOOD SUGAR THREE TIMES DAILY   primidone  50 MG tablet Commonly known as: MYSOLINE  TAKE 1 TABLET BY MOUTH AT BEDTIME   rosuvastatin  40 MG tablet Commonly known as: CRESTOR  TAKE 1 TABLET BY MOUTH ONCE DAILY. REPLACES ATORVASTATIN        Follow-up Information     Schedule an appointment as soon as possible for a visit  with Martin Hove, MD.   Specialty: Internal Medicine Contact information: [address] [place] [address] [phone]         [facility] Emergency Department at [facility].   Specialty: Emergency Medicine Why: If symptoms worsen Contact information: [address] [place]  [address] [phone]                Discharge Exam: [name] Weights   04/26/24 1500 04/27/24 0511  Weight: 96.2 kg 93.3 kg    GENERAL:  Alert, pleasant, no acute distress, disheveled HEENT:  EOMI CARDIOVASCULAR:  RRR, no murmurs appreciated RESPIRATORY:  Clear to auscultation, no wheezing, rales, or rhonchi GASTROINTESTINAL:  Soft, nontender, nondistended EXTREMITIES:  No LE edema bilaterally NEURO:  No new focal deficits appreciated SKIN:  No rashes noted PSYCH:  Appropriate mood and affect    Condition at discharge: improving  The results of significant diagnostics from this hospitalization (including  imaging, microbiology, ancillary and laboratory) are listed below for reference.   Imaging Studies: MR BRAIN WO CONTRAST Result Date: 04/25/2024 CLINICAL DATA:  Initial evaluation for acute mental status change, unknown cause. EXAM: MRI HEAD WITHOUT CONTRAST TECHNIQUE: Multiplanar, multiecho pulse sequences of the brain and surrounding structures were obtained without intravenous contrast. COMPARISON:  CT from earlier the same day. FINDINGS: Brain: Diffuse prominence of the CSF containing spaces compatible with generalized cerebral atrophy. Minimal patchy T2/FLAIR hyperintensity involving the periventricular and deep white matter both cerebral hemispheres, most like related chronic microvascular ischemic disease, minor for age. No evidence for acute or subacute infarct. No areas of chronic cortical infarction. No acute or chronic intracranial blood products. No mass lesion, midline shift or mass effect. Ventricular prominence related to global parenchymal volume loss of hydrocephalus. No extra-axial fluid collection. Pituitary gland within normal limits. Vascular: Major intracranial vascular flow voids are maintained. Skull and upper cervical spine: Craniocervical junction within normal limits. Bone marrow signal intensity normal. No scalp soft tissue abnormality. Sinuses/Orbits: Prior bilateral ocular lens replacement. Scattered mucosal thickening noted throughout the paranasal  sinuses. No mastoid effusion. Other: None. IMPRESSION: 1. No acute intracranial abnormality. 2. Generalized cerebral atrophy with minimal chronic microvascular ischemic disease. Electronically Signed   By: Virgia Griffins M.D.   On: 04/25/2024 19:44   CT CHEST ABDOMEN PELVIS W CONTRAST Result Date: 04/25/2024 CLINICAL DATA:  Polytrauma, blunt EXAM: CT CHEST, ABDOMEN, AND PELVIS WITH CONTRAST TECHNIQUE: Multidetector CT imaging of the chest, abdomen and pelvis was performed following the standard protocol during bolus  administration of intravenous contrast. RADIATION DOSE REDUCTION: This exam was performed according to the departmental dose-optimization program which includes automated exposure control, adjustment of the mA and/or kV according to patient size and/or use of iterative reconstruction technique. CONTRAST:  OMNIPAQUE  IOHEXOL  300 MG/ML  SOLN COMPARISON:  MRI abdomen dated 05/15/2023.  PET-CT dated 11/20/2020. FINDINGS: CT CHEST FINDINGS Cardiovascular: Normal heart size. No pericardial effusion. Thoracic aorta is normal in caliber with extensive atherosclerotic calcification extending into the arch branch vessels. Multivessel coronary artery calcifications. Mediastinum/Nodes: No pathologically enlarged mediastinal, hilar, or axillary lymph nodes. Subcentimeter hypodense left thyroid  nodules. Trachea and esophagus are unremarkable. Lungs/Pleura: Mild upper lobe predominant centrilobular emphysema. Similar 4 mm nodule in the left upper lobe, not significantly changed since 11/20/2020, favoring a benign etiology. Bilateral lower lobe bronchial wall thickening and mild bronchiectasis. Dependent changes at the lung bases. No focal consolidation, pleural effusion, or pneumothorax. Musculoskeletal: No acute osseous abnormality. No chest wall mass or suspicious bone lesions identified. CT ABDOMEN PELVIS FINDINGS Hepatobiliary: No hepatic injury or perihepatic hematoma. Status post cholecystectomy. Pancreas: Unremarkable. Spleen: No splenic injury or perisplenic hematoma. Adrenals/Urinary Tract: Essentially unchanged small bilateral adrenal nodules, previously characterized as benign on the prior MRI abdomen. Status post right nephrectomy. No evidence to suggest recurrence in the resection bed. The left kidney is normal without suspicious focal lesion. No obstructive urolithiasis or hydronephrosis. Focal nodular soft tissue thickening along the right bladder wall measuring approximately 1.7 x 0.7 cm (series 5, image 106),  not definitively visualized on the prior exam. Stomach/Bowel: The stomach is within normal limits. Large right lateral abdominal wall hernia containing portions of the liver, right colon, and mid to distal small bowel. No significant free fluid or inflammatory changes. No evidence of obstruction. Colonic diverticulosis without evidence of acute diverticulitis. Vascular/Lymphatic: The abdominal aorta is normal in caliber with severe atherosclerotic calcification. No enlarged abdominal or pelvic lymph nodes. Reproductive: Prostate is enlarged measuring up to 5.5 cm in diameter. Other: No abdominopelvic ascites.  No intraperitoneal free air. Musculoskeletal: No acute osseous abnormality. No suspicious osseous lesion. IMPRESSION: 1. No acute traumatic findings in the chest, abdomen, or pelvis. 2. Focal nodular soft tissue thickening along the right bladder wall, not definitively visualized on the prior PET-CT dated 11/20/2020. Recommend correlation with clinical history and interval imaging, if available. Further evaluation with cystoscopy could be considered. 3. Status post right nephrectomy. 4. Large right lateral abdominal wall hernia is again noted containing portions of the liver, right colon, and mid to distal small bowel. No evidence of obstruction. 5. Colonic diverticulosis without evidence of acute diverticulitis. 6. Prostatomegaly. 7. Aortic Atherosclerosis (ICD10-I70.0) and Emphysema (ICD10-J43.9). Electronically Signed   By: Mannie Seek M.D.   On: 04/25/2024 15:37   CT Cervical Spine Wo Contrast Result Date: 04/25/2024 CLINICAL DATA:  Weakness, fell, neck pain and headache EXAM: CT CERVICAL SPINE WITHOUT CONTRAST TECHNIQUE: Multidetector CT imaging of the cervical spine was performed without intravenous contrast. Multiplanar CT image reconstructions were also generated. RADIATION DOSE REDUCTION: This exam was performed according  to the departmental dose-optimization program which includes automated  exposure control, adjustment of the mA and/or kV according to patient size and/or use of iterative reconstruction technique. COMPARISON:  10/09/2020 FINDINGS: Alignment: Alignment is grossly anatomic. Skull base and vertebrae: No acute fracture. No primary bone lesion or focal pathologic process. Soft tissues and spinal canal: No prevertebral fluid or swelling. No visible canal hematoma. Disc levels: Mild lower cervical spondylosis at the C5-6, C6-7, and C7-T1 levels. Mild left predominant facet hypertrophy throughout the cervical spine. Upper chest: Airway is patent. Visualized portions of the lung apices are clear. Other: Reconstructed images demonstrate no additional findings. IMPRESSION: 1. No acute cervical spine fracture. Electronically Signed   By: Bobbye Burrow M.D.   On: 04/25/2024 15:15   CT Head Wo Contrast Result Date: 04/25/2024 CLINICAL DATA:  Weakness, fell, neck pain and headache EXAM: CT HEAD WITHOUT CONTRAST TECHNIQUE: Contiguous axial images were obtained from the base of the skull through the vertex without intravenous contrast. RADIATION DOSE REDUCTION: This exam was performed according to the departmental dose-optimization program which includes automated exposure control, adjustment of the mA and/or kV according to patient size and/or use of iterative reconstruction technique. COMPARISON:  None Available. FINDINGS: Brain: No acute infarct or hemorrhage. Lateral ventricles and midline structures are unremarkable. No acute extra-axial fluid collections. No mass effect. Vascular: Atherosclerosis throughout the vertebrobasilar system and bilateral internal carotid arteries. No hyperdense vessel. Skull: Normal. Negative for fracture or focal lesion. Sinuses/Orbits: Partial opacification of the right frontal sinus and bilateral ethmoid air cells. Other: None. IMPRESSION: 1. No acute intracranial process. Electronically Signed   By: Bobbye Burrow M.D.   On: 04/25/2024 15:12   PCV  ECHOCARDIOGRAM COMPLETE Result Date: 04/07/2024 Images from the original result were not included. Reason for Visit  Echocardiogram INDICATIONS: Coronary Artery Disease, Shortness of Breath   Echocardiogram: An echocardiogram in (2-d) mode was performed and in Doppler mode with color flow velocity mapping was performed. ventricular septum thickness 0.8 cm, L ventricular posterior wall thickness (diastole) 0.8 cm, left atrium size 3.7 cm, aortic root diameter 3.0 cm, L ventricle diastolic dimension 4.4 cm, L ventricle systolic dimension 3.0, L ventricle ejection fraction 55-60 %, and LV fractional shortening 25 % L ventricular outflow tract internal diameter 2.0 cm, L ventricular outflow tract flow velocity 1.0 m/s, aortic valve cusps 1.7 cm , aortic valve flow velocity 1.6 (m/sec), aortic valve systolic calculated mean flow gradient 5 mmHg, mitral valve diastolic peak flow velocity E 1.61 m/sec, and mitral valve diastolic peak flow E/A ratio 0.7 Mitral valve has trace regurgitation ASSESSMENT Technically adequate study. Normal chamber sizes. Normal left ventricular systolic function. Mild left ventricular hypertrophy with GRADE 1 (relaxation abnormality) diastolic dysfunction. Normal right ventricular systolic function. Normal right ventricular diastolic function. Right ventricular diastolic dysfunction. Normal left ventricular wall motion. Normal right ventricular wall motion. No pulmonary regurgitation. No tricuspid regurgitation. Normal pulmonary artery pressure. Trace mitral regurgitation. Fibrocalcified aortic valve with no aortic regurgitation. No pericardial effusion.    Microbiology: Results for orders placed or performed during the hospital encounter of 04/25/24  Resp panel by RT-PCR (RSV, Flu A&B, Covid) Anterior Nasal Swab     Status: None   Collection Time: 04/25/24  4:21 PM   Specimen: Anterior Nasal Swab  Result Value Ref Range Status   SARS Coronavirus 2 by RT PCR NEGATIVE NEGATIVE Final     Comment: (NOTE) SARS-CoV-2 target nucleic acids are NOT DETECTED.  The SARS-CoV-2 RNA is generally detectable in upper respiratory  specimens during the acute phase of infection. The lowest concentration of SARS-CoV-2 viral copies this assay can detect is 138 copies/mL. A negative result does not preclude SARS-Cov-2 infection and should not be used as the sole basis for treatment or other patient management decisions. A negative result may occur with  improper specimen collection/handling, submission of specimen other than nasopharyngeal swab, presence of viral mutation(s) within the areas targeted by this assay, and inadequate number of viral copies(<138 copies/mL). A negative result must be combined with clinical observations, patient history, and epidemiological information. The expected result is Negative.  Fact Sheet for Patients:  BloggerCourse.com  Fact Sheet for Healthcare Providers:  SeriousBroker.it  This test is no t yet approved or cleared by the [place]  FDA and  has been authorized for detection and/or diagnosis of SARS-CoV-2 by FDA under an Emergency Use Authorization (EUA). This EUA will remain  in effect (meaning this test can be used) for the duration of the COVID-19 declaration under Section 564(b)(1) of the Act, 21 U.S.C.section 360bbb-3(b)(1), unless the authorization is terminated  or revoked sooner.       Influenza A by PCR NEGATIVE NEGATIVE Final   Influenza B by PCR NEGATIVE NEGATIVE Final    Comment: (NOTE) The Xpert Xpress SARS-CoV-2/FLU/RSV plus assay is intended as an aid in the diagnosis of influenza from Nasopharyngeal swab specimens and should not be used as a sole basis for treatment. Nasal washings and aspirates are unacceptable for Xpert Xpress SARS-CoV-2/FLU/RSV testing.  Fact Sheet for Patients: BloggerCourse.com  Fact Sheet for Healthcare  Providers: SeriousBroker.it  This test is not yet approved or cleared by the [place]  FDA and has been authorized for detection and/or diagnosis of SARS-CoV-2 by FDA under an Emergency Use Authorization (EUA). This EUA will remain in effect (meaning this test can be used) for the duration of the COVID-19 declaration under Section 564(b)(1) of the Act, 21 U.S.C. section 360bbb-3(b)(1), unless the authorization is terminated or revoked.     Resp Syncytial Virus by PCR NEGATIVE NEGATIVE Final    Comment: (NOTE) Fact Sheet for Patients: BloggerCourse.com  Fact Sheet for Healthcare Providers: SeriousBroker.it  This test is not yet approved or cleared by the [place]  FDA and has been authorized for detection and/or diagnosis of SARS-CoV-2 by FDA under an Emergency Use Authorization (EUA). This EUA will remain in effect (meaning this test can be used) for the duration of the COVID-19 declaration under Section 564(b)(1) of the Act, 21 U.S.C. section 360bbb-3(b)(1), unless the authorization is terminated or revoked.  Performed at [facility], [address] [place], [place] [address]     Labs: CBC: Recent Labs  Lab 04/25/24 1154 04/26/24 0548 04/27/24 0408  WBC 7.4 10.9* 8.2  NEUTROABS 6.3  --   --   HGB 15.5 14.4 13.8  HCT 48.0 44.0 42.5  MCV 78.6* 77.3* 78.6*  PLT 112* 112* 113*   Basic Metabolic Panel: Recent Labs  Lab 04/25/24 1154 04/26/24 0548 04/27/24 0408  NA 132* 133* 136  K 4.0 3.5 3.6  CL 98 100 104  CO2 22 21* 27  GLUCOSE 179* 91 89  BUN 25* 25* 35*  CREATININE 1.10 0.96 1.11  CALCIUM  11.2* 10.2 9.7  MG  --   --  2.3   Liver Function Tests: Recent Labs  Lab 04/25/24 1154  AST 30  ALT 23  ALKPHOS 58  BILITOT 1.3*  PROT 7.2  ALBUMIN 4.0   CBG: Recent Labs  Lab 04/26/24 1224 04/26/24  1735 04/26/24 2137 04/27/24 0738 04/27/24 1121  GLUCAP  129* 157* 150* 105* 141*    Discharge time spent: 38 minutes.  Signed: Jodeane Mulligan, DO [facility] 04/27/2024

## 2024-04-27 NOTE — Evaluation (Addendum)
 Physical Therapy Evaluation Patient Details Name: [name] DOB: 03/07/1946 Today's Date: 04/27/2024  History of Present Illness  [name] is a 77yoM who comes to [facility] on 04/25/24 from home after episode of weakness, fall, has neck pain and headache. CT head cervical spine and chest abdomen pelvis are all unremarkable.  Patient and his spouse now provide additional information including that he is currently being treated for a UTI with antibiotics by Dr. Meredeth Stallion.  Also reports that his 2 falls recently were both in the setting of getting lightheaded while standing and syncope. COVID and flu negative. MRI negative. Pt orthostatic on 4/30 during OT assessment, hence PT eval rescheduled to 5/1.  Clinical Impression  Pt has been up today already to use urinal, sat at EOB and bathed self, is feeling remarkably better and is more tolerant to upright compared to previous day. Pt able to partake in 2 short walks this assessment, but ~169ft, neither with any device or hand support. Pt feels close to baseline, wife agrees. Vitals checked after walking appears WNL. No syncopal prodome. Updates expedited to MD/TOC. Will continue to follow.        If plan is discharge home, recommend the following: Assist for transportation;Assistance with cooking/housework   Can travel by private vehicle        Equipment Recommendations None recommended by PT  Recommendations for Other Services       Functional Status Assessment Patient has had a recent decline in their functional status and demonstrates the ability to make significant improvements in function in a reasonable and predictable amount of time.     Precautions / Restrictions Precautions Precautions: Fall Restrictions Weight Bearing Restrictions Per Provider Order: No      Mobility  Bed Mobility                    Transfers Overall transfer level: Modified independent Equipment used: None Transfers: Sit to/from  Stand Sit to Stand: Supervision                Ambulation/Gait Ambulation/Gait assistance: Contact guard assist Gait Distance (Feet): 150 Feet Assistive device: None Gait Pattern/deviations: Festinating       General Gait Details: very close to baseline per pt and per wife  Stairs            Wheelchair Mobility     Tilt Bed    Modified Rankin (Stroke Patients Only)       Balance                                             Pertinent Vitals/Pain Pain Assessment Pain Assessment: No/denies pain    Home Living Family/patient expects to be discharged to:: Private residence Living Arrangements: Spouse/significant other Available Help at Discharge: Family Type of Home: House Home Access: Ramped entrance       Home Layout: One level Home Equipment: Agricultural consultant (2 wheels);Shower seat      Prior Function Prior Level of Function : Independent/Modified Independent;Driving             Mobility Comments: no AD use       Extremity/Trunk Assessment                Communication   Communication Communication: No apparent difficulties    Cognition Arousal: Alert Behavior During Therapy: WFL for tasks  assessed/performed                             Following commands: Intact       Cueing       General Comments      Exercises Other Exercises Other Exercises: 2 walks with a sit break between. No device for either, no LOB either.   Assessment/Plan    PT Assessment Patient needs continued PT services  PT Problem List Decreased strength;Decreased activity tolerance;Decreased balance;Decreased mobility       PT Treatment Interventions DME instruction;Gait training;Stair training;Functional mobility training;Therapeutic activities;Therapeutic exercise;Balance training;Neuromuscular re-education;Patient/family education    PT Goals (Current goals can be found in the Care Plan section)  Acute Rehab PT  Goals Patient Stated Goal: regain strength PT Goal Formulation: With patient Time For Goal Achievement: 05/11/24 Potential to Achieve Goals: Good    Frequency Min 2X/week     Co-evaluation               AM-PAC PT "6 Clicks" Mobility  Outcome Measure Help needed turning from your back to your side while in a flat bed without using bedrails?: None Help needed moving from lying on your back to sitting on the side of a flat bed without using bedrails?: None Help needed moving to and from a bed to a chair (including a wheelchair)?: A Little Help needed standing up from a chair using your arms (e.g., wheelchair or bedside chair)?: A Little Help needed to walk in hospital room?: A Little Help needed climbing 3-5 steps with a railing? : A Little 6 Click Score: 20    End of Session Equipment Utilized During Treatment: Gait belt Activity Tolerance: Patient tolerated treatment well;No increased pain;Patient limited by fatigue Patient left: in chair;with call bell/phone within reach;with chair alarm set;with family/visitor present Nurse Communication: Mobility status PT Visit Diagnosis: Difficulty in walking, not elsewhere classified (R26.2);Other abnormalities of gait and mobility (R26.89)    Time: 1212-1226 PT Time Calculation (min) (ACUTE ONLY): 14 min   Charges:   PT Evaluation $PT Eval Moderate Complexity: 1 Mod PT Treatments $Therapeutic Activity: 8-22 mins PT General Charges $$ ACUTE PT VISIT: 1 Visit        12:39 PM, 04/27/24 Dawn Eth, PT, DPT Physical Therapist - [facility]  [phone] (ASCOM)    Jakai Risse C 04/27/2024, 12:37 PM

## 2024-04-27 NOTE — Care Management Obs Status (Deleted)
 MEDICARE OBSERVATION STATUS NOTIFICATION   Patient Details  Name: [name] MRN: [id number] Date of Birth: 01/30/1946   Medicare Observation Status Notification Given:  Rudolph Cost, CMA 04/27/2024, 10:35 AM

## 2024-04-28 ENCOUNTER — Telehealth: Payer: Self-pay

## 2024-04-28 ENCOUNTER — Encounter: Payer: Self-pay | Admitting: Cardiovascular Disease

## 2024-04-28 NOTE — Telephone Encounter (Signed)
 [facility] received the referral and wanted to inform you theyd be going out to see the patient on Monday.

## 2024-05-01 ENCOUNTER — Telehealth: Payer: Self-pay | Admitting: Internal Medicine

## 2024-05-01 DIAGNOSIS — I700 Atherosclerosis of aorta: Secondary | ICD-10-CM | POA: Diagnosis not present

## 2024-05-01 DIAGNOSIS — I110 Hypertensive heart disease with heart failure: Secondary | ICD-10-CM | POA: Diagnosis not present

## 2024-05-01 DIAGNOSIS — J439 Emphysema, unspecified: Secondary | ICD-10-CM | POA: Diagnosis not present

## 2024-05-01 DIAGNOSIS — Z794 Long term (current) use of insulin: Secondary | ICD-10-CM | POA: Diagnosis not present

## 2024-05-01 DIAGNOSIS — I2510 Atherosclerotic heart disease of native coronary artery without angina pectoris: Secondary | ICD-10-CM | POA: Diagnosis not present

## 2024-05-01 DIAGNOSIS — E119 Type 2 diabetes mellitus without complications: Secondary | ICD-10-CM | POA: Diagnosis not present

## 2024-05-01 DIAGNOSIS — I5032 Chronic diastolic (congestive) heart failure: Secondary | ICD-10-CM | POA: Diagnosis not present

## 2024-05-01 DIAGNOSIS — D696 Thrombocytopenia, unspecified: Secondary | ICD-10-CM | POA: Diagnosis not present

## 2024-05-01 DIAGNOSIS — I951 Orthostatic hypotension: Secondary | ICD-10-CM | POA: Diagnosis not present

## 2024-05-01 NOTE — Telephone Encounter (Signed)
 Sherline Distel, PT with [facility], left VM requesting orders for PT for 2w4.  Callback # [phone]

## 2024-05-05 ENCOUNTER — Other Ambulatory Visit: Payer: Self-pay | Admitting: Family

## 2024-05-05 ENCOUNTER — Other Ambulatory Visit: Payer: Self-pay | Admitting: Internal Medicine

## 2024-05-12 ENCOUNTER — Ambulatory Visit: Admitting: Internal Medicine

## 2024-05-12 ENCOUNTER — Encounter: Payer: Self-pay | Admitting: Internal Medicine

## 2024-05-12 ENCOUNTER — Ambulatory Visit: Payer: Self-pay | Admitting: Internal Medicine

## 2024-05-12 VITALS — BP 150/82 | HR 88 | Ht 72.0 in | Wt 212.6 lb

## 2024-05-12 DIAGNOSIS — E1165 Type 2 diabetes mellitus with hyperglycemia: Secondary | ICD-10-CM

## 2024-05-12 DIAGNOSIS — E782 Mixed hyperlipidemia: Secondary | ICD-10-CM | POA: Diagnosis not present

## 2024-05-12 DIAGNOSIS — Z85528 Personal history of other malignant neoplasm of kidney: Secondary | ICD-10-CM

## 2024-05-12 DIAGNOSIS — I2510 Atherosclerotic heart disease of native coronary artery without angina pectoris: Secondary | ICD-10-CM

## 2024-05-12 DIAGNOSIS — E1159 Type 2 diabetes mellitus with other circulatory complications: Secondary | ICD-10-CM | POA: Diagnosis not present

## 2024-05-12 DIAGNOSIS — I5022 Chronic systolic (congestive) heart failure: Secondary | ICD-10-CM | POA: Diagnosis not present

## 2024-05-12 DIAGNOSIS — E1169 Type 2 diabetes mellitus with other specified complication: Secondary | ICD-10-CM | POA: Diagnosis not present

## 2024-05-12 DIAGNOSIS — I152 Hypertension secondary to endocrine disorders: Secondary | ICD-10-CM | POA: Diagnosis not present

## 2024-05-12 LAB — POCT CBG (FASTING - GLUCOSE)-MANUAL ENTRY: Glucose Fasting, POC: 162 mg/dL — AB (ref 70–99)

## 2024-05-12 MED ORDER — CARVEDILOL 6.25 MG PO TABS: 6.2500 mg | ORAL_TABLET | Freq: Two times a day (BID) | ORAL | 11 refills | Status: AC

## 2024-05-12 NOTE — Progress Notes (Signed)
 Established Patient Office Visit  Subjective:  Patient ID: [name], male    DOB: 17-Jul-1946  Age: 78 y.o. MRN: [id number]  Chief Complaint  Patient presents with   Hospitalization Follow-up    Patient comes in for his hospital follow-up today accompanied by his wife.  Patient was admitted from 04/25/2024 until 04/27/2024 at [facility] with history of multiple falls.  He reports that he had a viral episode followed by dehydration which caused his blood pressure to drop and he fell a few times.  At the last fall, he completely passed out and hit his head and was taken to the emergency room by the EMT.  His blood pressure was found to be low and received IV fluids. CT scan of his head and  C-spine were unremarkable.  But CT of his abdomen/pelvis showed focal nodular soft tissue swelling along the right bladder wall which was not seen before.  Patient has history of right nephrectomy for renal cancer. Referral sent to urologist for further evaluation. Patient was advised to stay off all his blood pressure medications but today his blood pressure is up.  Will slowly reintroduce his antihypertensives starting with Coreg  6.25 mg twice a day.  Patient will monitor his blood pressure at home and will discuss how to resume his meds. Denies any headaches or dizziness today.  No chest pain and no palpitations.  Feels very tired.    No other concerns at this time.   Past Medical History:  Diagnosis Date   Back pain    Bladder cancer (HCC)    COVID-19 09/10/2020   Received Casirivimab/Irdevimab infusion on 09/11/20   Diabetes mellitus without complication (HCC)    HOH (hard of hearing)    Hypertension    Obesity    Renal cancer (HCC)    Renal cell cancer (HCC)    Throat cancer (HCC)    Tremor     Past Surgical History:  Procedure Laterality Date   APPENDECTOMY     BLADDER SURGERY     CAROTID PTA/STENT INTERVENTION Right 08/26/2021   Procedure: CAROTID PTA/STENT INTERVENTION;  Surgeon:  Jackquelyn Mass, MD;  Location: [name] INVASIVE CV LAB;  Service: Cardiovascular;  Laterality: Right;   CATARACT EXTRACTION W/PHACO Right 01/21/2023   Procedure: CATARACT EXTRACTION PHACO AND INTRAOCULAR LENS PLACEMENT (IOC) RIGHT DIABETIC  4.89  00:45.6;  Surgeon: Trudi Fus, MD;  Location: [facility];  Service: Ophthalmology;  Laterality: Right;  Diabetic   CERVICAL SPINE SURGERY     CHOLECYSTECTOMY N/A 03/23/2018   Procedure: LAPAROSCOPIC CHOLECYSTECTOMY;  Surgeon: Eldred Grego, MD;  Location: [name] ORS;  Service: General;  Laterality: N/A;   COLONOSCOPY     COLONOSCOPY WITH PROPOFOL  N/A 11/10/2017   Procedure: COLONOSCOPY WITH PROPOFOL ;  Surgeon: Toledo, Alphonsus Jeans, MD;  Location: [name] ENDOSCOPY;  Service: Gastroenterology;  Laterality: N/A;   ESOPHAGOGASTRODUODENOSCOPY (EGD) WITH PROPOFOL  N/A 11/10/2017   Procedure: ESOPHAGOGASTRODUODENOSCOPY (EGD) WITH PROPOFOL ;  Surgeon: Toledo, Alphonsus Jeans, MD;  Location: [name] ENDOSCOPY;  Service: Gastroenterology;  Laterality: N/A;   HERNIA REPAIR     left od surgery     MICROLARYNGOSCOPY N/A 11/05/2020   Procedure: MICROLARYNGOSCOPY WITH BIOPSIES;  Surgeon: Von Grumbling, MD;  Location: [facility];  Service: ENT;  Laterality: N/A;   NEPHRECTOMY     right kidney nephrectomy   rt elbow surgery     THROAT SURGERY  1990   for cancer removal at [facility]    Social History   Socioeconomic History  Marital status: Married    Spouse name: Not on file   Number of children: Not on file   Years of education: Not on file   Highest education level: Not on file  Occupational History   Not on file  Tobacco Use   Smoking status: Former    Current packs/day: 0.00    Types: Cigarettes    Quit date: 71    Years since quitting: 34.3   Smokeless tobacco: Never  Vaping Use   Vaping status: Never Used  Substance and Sexual Activity   Alcohol use: Not Currently    Comment: Occasional beer drinker   Drug use: No    Sexual activity: Yes  Other Topics Concern   Not on file  Social History Narrative   Lives at home with wife, active and independent at baseline   Social Drivers of Health   Financial Resource Strain: Not on file  Food Insecurity: No Food Insecurity (04/26/2024)   Hunger Vital Sign    Worried About Running Out of Food in the Last Year: Never true    Ran Out of Food in the Last Year: Never true  Transportation Needs: No Transportation Needs (04/26/2024)   PRAPARE - Administrator, Civil Service (Medical): No    Lack of Transportation (Non-Medical): No  Physical Activity: Not on file  Stress: Not on file  Social Connections: Socially Integrated (04/26/2024)   Social Connection and Isolation Panel [NHANES]    Frequency of Communication with Friends and Family: More than three times a week    Frequency of Social Gatherings with Friends and Family: Twice a week    Attends Religious Services: More than 4 times per year    Active Member of [employer] or Organizations: Yes    Attends Engineer, structural: More than 4 times per year    Marital Status: Married  Catering manager Violence: Not At Risk (04/26/2024)   Humiliation, Afraid, Rape, and Kick questionnaire    Fear of Current or Ex-Partner: No    Emotionally Abused: No    Physically Abused: No    Sexually Abused: No    Family History  Problem Relation Age of Onset   Diabetes Brother    Hypertension Brother    CAD Brother    CAD Brother     Allergies  Allergen Reactions   Metformin Diarrhea    Outpatient Medications Prior to Visit  Medication Sig   acetaminophen  (TYLENOL ) 500 MG tablet Take 500 mg by mouth every 6 (six) hours as needed.   amitriptyline  (ELAVIL ) 25 MG tablet TAKE 2 TABLETS BY MOUTH AT BEDTIME   aspirin  EC 81 MG tablet Take 81 mg by mouth daily. Swallow whole.   cetirizine  (ZYRTEC  ALLERGY) 10 MG tablet Take 1 tablet (10 mg total) by mouth daily.   chlorthalidone  (HYGROTON ) 25 MG tablet Take 1  tablet by mouth once daily   Cholecalciferol  50 MCG (2000 UT) CAPS Take 2,000 Units by mouth daily.   fluticasone  (FLONASE ) 50 MCG/ACT nasal spray Use 2 spray(s) in each nostril once daily   glimepiride  (AMARYL ) 2 MG tablet Take 1 tablet (2 mg total) by mouth 2 (two) times daily.   insulin  NPH-regular Human (NOVOLIN 70/30) (70-30) 100 UNIT/ML injection Inject 30 Units into the skin 2 (two) times daily with a meal.   ONETOUCH ULTRA TEST test strip USE TO CHECK BLOOD SUGAR THREE TIMES DAILY   primidone  (MYSOLINE ) 50 MG tablet TAKE 1 TABLET BY MOUTH AT BEDTIME  rosuvastatin  (CRESTOR ) 40 MG tablet TAKE 1 TABLET BY MOUTH ONCE DAILY. REPLACES ATORVASTATIN   Facility-Administered Medications Prior to Visit  Medication Dose Route Frequency Provider   technetium sestamibi generic (CARDIOLITE ) injection 10 millicurie  10 millicurie Intravenous Once PRN Cherrie Cornwall, MD    Review of Systems  Constitutional:  Positive for malaise/fatigue. Negative for chills and fever.  HENT: Negative.  Negative for sore throat.   Eyes: Negative.   Respiratory: Negative.  Negative for cough and shortness of breath.   Cardiovascular: Negative.  Negative for chest pain, palpitations and leg swelling.  Gastrointestinal: Negative.  Negative for abdominal pain, constipation, diarrhea, heartburn, nausea and vomiting.  Genitourinary: Negative.  Negative for dysuria and flank pain.  Musculoskeletal:  Positive for falls. Negative for joint pain and myalgias.  Skin: Negative.   Neurological:  Positive for tremors. Negative for dizziness, tingling, sensory change, speech change, focal weakness and headaches.  Endo/Heme/Allergies: Negative.   Psychiatric/Behavioral: Negative.  Negative for depression and suicidal ideas. The patient is not nervous/anxious.        Objective:   BP (!) 150/82   Pulse 88   Ht 6' (1.829 m)   Wt 212 lb 9.6 oz (96.4 kg)   SpO2 97%   BMI 28.83 kg/m   Vitals:   05/12/24 1452  BP: (!)  150/82  Pulse: 88  Height: 6' (1.829 m)  Weight: 212 lb 9.6 oz (96.4 kg)  SpO2: 97%  BMI (Calculated): 28.83    Physical Exam Vitals and nursing note reviewed.  Constitutional:      Appearance: Normal appearance.  HENT:     Head: Normocephalic and atraumatic.     Nose: Nose normal.     Mouth/Throat:     Mouth: Mucous membranes are moist.     Pharynx: Oropharynx is clear.  Eyes:     Conjunctiva/sclera: Conjunctivae normal.     Pupils: Pupils are equal, round, and reactive to light.  Cardiovascular:     Rate and Rhythm: Normal rate and regular rhythm.     Pulses: Normal pulses.     Heart sounds: Normal heart sounds.  Pulmonary:     Effort: Pulmonary effort is normal.     Breath sounds: Normal breath sounds.  Abdominal:     General: Bowel sounds are normal.     Palpations: Abdomen is soft.  Musculoskeletal:        General: Normal range of motion.     Cervical back: Normal range of motion.  Skin:    General: Skin is warm and dry.  Neurological:     General: No focal deficit present.     Mental Status: He is alert and oriented to person, place, and time.  Psychiatric:        Mood and Affect: Mood normal.        Behavior: Behavior normal.        Judgment: Judgment normal.      Results for orders placed or performed in visit on 05/12/24  POCT CBG (Fasting - Glucose)  Result Value Ref Range   Glucose Fasting, POC 162 (A) 70 - 99 mg/dL    Recent Results (from the past 2160 hours)  POCT CBG (Fasting - Glucose)     Status: Abnormal   Collection Time: 04/13/24  9:55 AM  Result Value Ref Range   Glucose Fasting, POC 136 (A) 70 - 99 mg/dL  POCT Urinalysis Dipstick (81191)     Status: Abnormal   Collection Time: 04/13/24 10:15 AM  Result Value Ref Range   Color, UA Yellow    Clarity, UA Clear    Glucose, UA Positive (A) Negative   Bilirubin, UA Negative    Ketones, UA Negative    Spec Grav, UA 1.010 1.010 - 1.025   Blood, UA Large    pH, UA 6.0 5.0 - 8.0    Protein, UA Negative Negative   Urobilinogen, UA 0.2 0.2 or 1.0 E.U./dL   Nitrite, UA Negative    Leukocytes, UA Negative Negative   Appearance Clear    Odor Yes   POC CREATINE & ALBUMIN,URINE     Status: None   Collection Time: 04/13/24 10:15 AM  Result Value Ref Range   Microalbumin Ur, POC 30 mg/L   Creatinine, POC 100 mg/dL   Albumin/Creatinine Ratio, Urine, POC <30   CBC with Diff     Status: Abnormal   Collection Time: 04/13/24 10:35 AM  Result Value Ref Range   WBC 9.5 3.4 - 10.8 x10E3/uL   RBC 6.09 (H) 4.14 - 5.80 x10E6/uL   Hemoglobin 15.7 13.0 - 17.7 g/dL   Hematocrit 16.1 09.6 - 51.0 %   MCV 81 79 - 97 fL   MCH 25.8 (L) 26.6 - 33.0 pg   MCHC 31.7 31.5 - 35.7 g/dL   RDW 04.5 (H) 40.9 - 81.1 %   Platelets 150 150 - 450 x10E3/uL   Neutrophils 72 Not Estab. %   Lymphs 13 Not Estab. %   Monocytes 7 Not Estab. %   Eos 7 Not Estab. %   Basos 1 Not Estab. %   Neutrophils Absolute 6.8 1.4 - 7.0 x10E3/uL   Lymphocytes Absolute 1.3 0.7 - 3.1 x10E3/uL   Monocytes Absolute 0.7 0.1 - 0.9 x10E3/uL   EOS (ABSOLUTE) 0.7 (H) 0.0 - 0.4 x10E3/uL   Basophils Absolute 0.1 0.0 - 0.2 x10E3/uL   Immature Granulocytes 0 Not Estab. %   Immature Grans (Abs) 0.0 0.0 - 0.1 x10E3/uL  CMP14+EGFR     Status: Abnormal   Collection Time: 04/13/24 10:35 AM  Result Value Ref Range   Glucose 116 (H) 70 - 99 mg/dL   BUN 18 8 - 27 mg/dL   Creatinine, Ser 9.14 0.76 - 1.27 mg/dL   eGFR 68 >78 GN/FAO/1.30   BUN/Creatinine Ratio 16 10 - 24   Sodium 141 134 - 144 mmol/L   Potassium 4.2 3.5 - 5.2 mmol/L   Chloride 102 96 - 106 mmol/L   CO2 20 20 - 29 mmol/L   Calcium  12.0 (H) 8.6 - 10.2 mg/dL   Total Protein 7.2 6.0 - 8.5 g/dL   Albumin 4.6 3.8 - 4.8 g/dL   Globulin, Total 2.6 1.5 - 4.5 g/dL   Bilirubin Total 0.5 0.0 - 1.2 mg/dL   Alkaline Phosphatase 72 44 - 121 IU/L   AST 15 0 - 40 IU/L   ALT 12 0 - 44 IU/L  Lipid Panel w/o Chol/HDL Ratio     Status: None   Collection Time: 04/13/24 10:35 AM   Result Value Ref Range   Cholesterol, Total 131 100 - 199 mg/dL   Triglycerides 95 0 - 149 mg/dL   HDL 41 >86 mg/dL   VLDL Cholesterol Cal 18 5 - 40 mg/dL   LDL Chol Calc (NIH) 72 0 - 99 mg/dL  Hemoglobin V7Q     Status: Abnormal   Collection Time: 04/13/24 10:35 AM  Result Value Ref Range   Hgb A1c MFr Bld 6.7 (H) 4.8 - 5.6 %  Comment:          Prediabetes: 5.7 - 6.4          Diabetes: >6.4          Glycemic control for adults with diabetes: <7.0    Est. average glucose Bld gHb Est-mCnc 146 mg/dL  CBC with Differential     Status: Abnormal   Collection Time: 04/25/24 11:54 AM  Result Value Ref Range   WBC 7.4 4.0 - 10.5 K/uL   RBC 6.11 (H) 4.22 - 5.81 MIL/uL   Hemoglobin 15.5 13.0 - 17.0 g/dL   HCT 45.4 09.8 - 11.9 %   MCV 78.6 (L) 80.0 - 100.0 fL   MCH 25.4 (L) 26.0 - 34.0 pg   MCHC 32.3 30.0 - 36.0 g/dL   RDW 14.7 (H) 82.9 - 56.2 %   Platelets 112 (L) 150 - 400 K/uL   nRBC 0.0 0.0 - 0.2 %   Neutrophils Relative % 86 %   Neutro Abs 6.3 1.7 - 7.7 K/uL   Lymphocytes Relative 4 %   Lymphs Abs 0.3 (L) 0.7 - 4.0 K/uL   Monocytes Relative 6 %   Monocytes Absolute 0.5 0.1 - 1.0 K/uL   Eosinophils Relative 3 %   Eosinophils Absolute 0.2 0.0 - 0.5 K/uL   Basophils Relative 1 %   Basophils Absolute 0.0 0.0 - 0.1 K/uL   Immature Granulocytes 0 %   Abs Immature Granulocytes 0.03 0.00 - 0.07 K/uL    Comment: Performed at [facility], [address] [place], [place] [address]  Comprehensive metabolic panel     Status: Abnormal   Collection Time: 04/25/24 11:54 AM  Result Value Ref Range   Sodium 132 (L) 135 - 145 mmol/L   Potassium 4.0 3.5 - 5.1 mmol/L   Chloride 98 98 - 111 mmol/L   CO2 22 22 - 32 mmol/L   Glucose, Bld 179 (H) 70 - 99 mg/dL    Comment: Glucose reference range applies only to samples taken after fasting for at least 8 hours.   BUN 25 (H) 8 - 23 mg/dL   Creatinine, Ser 5.78 0.61 - 1.24 mg/dL   Calcium  11.2 (H) 8.9 - 10.3 mg/dL   Total Protein  7.2 6.5 - 8.1 g/dL   Albumin 4.0 3.5 - 5.0 g/dL   AST 30 15 - 41 U/L   ALT 23 0 - 44 U/L   Alkaline Phosphatase 58 38 - 126 U/L   Total Bilirubin 1.3 (H) 0.0 - 1.2 mg/dL   GFR, Estimated >46 >96 mL/min    Comment: (NOTE) Calculated using the CKD-EPI Creatinine Equation (2021)    Anion gap 12 5 - 15    Comment: Performed at [facility], [address] [place], [place] [address]  Brain natriuretic peptide     Status: None   Collection Time: 04/25/24 11:54 AM  Result Value Ref Range   B Natriuretic Peptide 36.4 0.0 - 100.0 pg/mL    Comment: Performed at [facility], [address] [place], [place] [address]  Resp panel by RT-PCR (RSV, Flu A&B, Covid) Anterior Nasal Swab     Status: None   Collection Time: 04/25/24  4:21 PM   Specimen: Anterior Nasal Swab  Result Value Ref Range   SARS Coronavirus 2 by RT PCR NEGATIVE NEGATIVE    Comment: (NOTE) SARS-CoV-2 target nucleic acids are NOT DETECTED.  The SARS-CoV-2 RNA is generally detectable in upper respiratory specimens during the acute phase of infection.  The lowest concentration of SARS-CoV-2 viral copies this assay can detect is 138 copies/mL. A negative result does not preclude SARS-Cov-2 infection and should not be used as the sole basis for treatment or other patient management decisions. A negative result may occur with  improper specimen collection/handling, submission of specimen other than nasopharyngeal swab, presence of viral mutation(s) within the areas targeted by this assay, and inadequate number of viral copies(<138 copies/mL). A negative result must be combined with clinical observations, patient history, and epidemiological information. The expected result is Negative.  Fact Sheet for Patients:  BloggerCourse.com  Fact Sheet for Healthcare Providers:  SeriousBroker.it  This test is no t yet approved or cleared by the [place]  FDA and   has been authorized for detection and/or diagnosis of SARS-CoV-2 by FDA under an Emergency Use Authorization (EUA). This EUA will remain  in effect (meaning this test can be used) for the duration of the COVID-19 declaration under Section 564(b)(1) of the Act, 21 U.S.C.section 360bbb-3(b)(1), unless the authorization is terminated  or revoked sooner.       Influenza A by PCR NEGATIVE NEGATIVE   Influenza B by PCR NEGATIVE NEGATIVE    Comment: (NOTE) The Xpert Xpress SARS-CoV-2/FLU/RSV plus assay is intended as an aid in the diagnosis of influenza from Nasopharyngeal swab specimens and should not be used as a sole basis for treatment. Nasal washings and aspirates are unacceptable for Xpert Xpress SARS-CoV-2/FLU/RSV testing.  Fact Sheet for Patients: BloggerCourse.com  Fact Sheet for Healthcare Providers: SeriousBroker.it  This test is not yet approved or cleared by the [place]  FDA and has been authorized for detection and/or diagnosis of SARS-CoV-2 by FDA under an Emergency Use Authorization (EUA). This EUA will remain in effect (meaning this test can be used) for the duration of the COVID-19 declaration under Section 564(b)(1) of the Act, 21 U.S.C. section 360bbb-3(b)(1), unless the authorization is terminated or revoked.     Resp Syncytial Virus by PCR NEGATIVE NEGATIVE    Comment: (NOTE) Fact Sheet for Patients: BloggerCourse.com  Fact Sheet for Healthcare Providers: SeriousBroker.it  This test is not yet approved or cleared by the [place]  FDA and has been authorized for detection and/or diagnosis of SARS-CoV-2 by FDA under an Emergency Use Authorization (EUA). This EUA will remain in effect (meaning this test can be used) for the duration of the COVID-19 declaration under Section 564(b)(1) of the Act, 21 U.S.C. section 360bbb-3(b)(1), unless the  authorization is terminated or revoked.  Performed at [facility], [address] [place], [place] [address]   Urinalysis, w/ Reflex to Culture (Infection Suspected) -Urine, Clean Catch     Status: Abnormal   Collection Time: 04/25/24  4:21 PM  Result Value Ref Range   Specimen Source URINE, CLEAN CATCH    Color, Urine YELLOW (A) YELLOW   APPearance CLEAR (A) CLEAR   Specific Gravity, Urine 1.044 (H) 1.005 - 1.030   pH 7.0 5.0 - 8.0   Glucose, UA >=500 (A) NEGATIVE mg/dL   Hgb urine dipstick SMALL (A) NEGATIVE   Bilirubin Urine NEGATIVE NEGATIVE   Ketones, ur NEGATIVE NEGATIVE mg/dL   Protein, ur NEGATIVE NEGATIVE mg/dL   Nitrite NEGATIVE NEGATIVE   Leukocytes,Ua NEGATIVE NEGATIVE   RBC / HPF 0-5 0 - 5 RBC/hpf   WBC, UA 0-5 0 - 5 WBC/hpf    Comment:        Reflex urine culture not performed if WBC <=10, OR if Squamous epithelial cells >5. If Squamous epithelial  cells >5 suggest recollection.    Bacteria, UA NONE SEEN NONE SEEN   Squamous Epithelial / HPF 0-5 0 - 5 /HPF    Comment: Performed at [facility], [address] [place], [place] [address]  CBG monitoring, ED     Status: None   Collection Time: 04/25/24 10:28 PM  Result Value Ref Range   Glucose-Capillary 83 70 - 99 mg/dL    Comment: Glucose reference range applies only to samples taken after fasting for at least 8 hours.  Basic metabolic panel     Status: Abnormal   Collection Time: 04/26/24  5:48 AM  Result Value Ref Range   Sodium 133 (L) 135 - 145 mmol/L   Potassium 3.5 3.5 - 5.1 mmol/L   Chloride 100 98 - 111 mmol/L   CO2 21 (L) 22 - 32 mmol/L   Glucose, Bld 91 70 - 99 mg/dL    Comment: Glucose reference range applies only to samples taken after fasting for at least 8 hours.   BUN 25 (H) 8 - 23 mg/dL   Creatinine, Ser 6.04 0.61 - 1.24 mg/dL   Calcium  10.2 8.9 - 10.3 mg/dL   GFR, Estimated >54 >09 mL/min    Comment: (NOTE) Calculated using the CKD-EPI Creatinine Equation (2021)     Anion gap 12 5 - 15    Comment: Performed at [facility], [address] [place], [place] [address]  CBC     Status: Abnormal   Collection Time: 04/26/24  5:48 AM  Result Value Ref Range   WBC 10.9 (H) 4.0 - 10.5 K/uL   RBC 5.69 4.22 - 5.81 MIL/uL   Hemoglobin 14.4 13.0 - 17.0 g/dL   HCT 47.8 29.5 - 62.1 %   MCV 77.3 (L) 80.0 - 100.0 fL   MCH 25.3 (L) 26.0 - 34.0 pg   MCHC 32.7 30.0 - 36.0 g/dL   RDW 30.8 (H) 65.7 - 84.6 %   Platelets 112 (L) 150 - 400 K/uL   nRBC 0.0 0.0 - 0.2 %    Comment: Performed at [facility], [address] [place], [place] [address]  CBG monitoring, ED     Status: Abnormal   Collection Time: 04/26/24  8:26 AM  Result Value Ref Range   Glucose-Capillary 102 (H) 70 - 99 mg/dL    Comment: Glucose reference range applies only to samples taken after fasting for at least 8 hours.  Glucose, capillary     Status: Abnormal   Collection Time: 04/26/24 12:24 PM  Result Value Ref Range   Glucose-Capillary 129 (H) 70 - 99 mg/dL    Comment: Glucose reference range applies only to samples taken after fasting for at least 8 hours.  Glucose, capillary     Status: Abnormal   Collection Time: 04/26/24  5:35 PM  Result Value Ref Range   Glucose-Capillary 157 (H) 70 - 99 mg/dL    Comment: Glucose reference range applies only to samples taken after fasting for at least 8 hours.  Glucose, capillary     Status: Abnormal   Collection Time: 04/26/24  9:37 PM  Result Value Ref Range   Glucose-Capillary 150 (H) 70 - 99 mg/dL    Comment: Glucose reference range applies only to samples taken after fasting for at least 8 hours.  Basic metabolic panel with GFR     Status: Abnormal   Collection Time: 04/27/24  4:08 AM  Result Value Ref Range   Sodium 136 135 - 145 mmol/L  Potassium 3.6 3.5 - 5.1 mmol/L   Chloride 104 98 - 111 mmol/L   CO2 27 22 - 32 mmol/L   Glucose, Bld 89 70 - 99 mg/dL    Comment: Glucose reference range applies only to samples taken  after fasting for at least 8 hours.   BUN 35 (H) 8 - 23 mg/dL   Creatinine, Ser 7.82 0.61 - 1.24 mg/dL   Calcium  9.7 8.9 - 10.3 mg/dL   GFR, Estimated >95 >62 mL/min    Comment: (NOTE) Calculated using the CKD-EPI Creatinine Equation (2021)    Anion gap 5 5 - 15    Comment: Performed at [facility], [address] [place], [place] [address]  CBC     Status: Abnormal   Collection Time: 04/27/24  4:08 AM  Result Value Ref Range   WBC 8.2 4.0 - 10.5 K/uL   RBC 5.41 4.22 - 5.81 MIL/uL   Hemoglobin 13.8 13.0 - 17.0 g/dL   HCT 57.8 46.9 - 62.9 %   MCV 78.6 (L) 80.0 - 100.0 fL   MCH 25.5 (L) 26.0 - 34.0 pg   MCHC 32.5 30.0 - 36.0 g/dL   RDW 52.8 (H) 41.3 - 24.4 %   Platelets 113 (L) 150 - 400 K/uL   nRBC 0.0 0.0 - 0.2 %    Comment: Performed at [facility], [address], [place], [place] [address]  Magnesium     Status: None   Collection Time: 04/27/24  4:08 AM  Result Value Ref Range   Magnesium 2.3 1.7 - 2.4 mg/dL    Comment: Performed at [facility], [address] [place], [place] [address]  Glucose, capillary     Status: Abnormal   Collection Time: 04/27/24  7:38 AM  Result Value Ref Range   Glucose-Capillary 105 (H) 70 - 99 mg/dL    Comment: Glucose reference range applies only to samples taken after fasting for at least 8 hours.  Glucose, capillary     Status: Abnormal   Collection Time: 04/27/24 11:21 AM  Result Value Ref Range   Glucose-Capillary 141 (H) 70 - 99 mg/dL    Comment: Glucose reference range applies only to samples taken after fasting for at least 8 hours.  POCT CBG (Fasting - Glucose)     Status: Abnormal   Collection Time: 05/12/24  3:00 PM  Result Value Ref Range   Glucose Fasting, POC 162 (A) 70 - 99 mg/dL      Assessment & Plan:  Urology referral for bladder wall lesion. Will slowly resume blood pressure medications as needed.  Start back on Coreg  6.25 mg twice a day and monitor blood pressure at home. Problem  List Items Addressed This Visit     History of kidney cancer   Relevant Orders   Ambulatory referral to Urology   Type 2 diabetes mellitus with hyperglycemia, without long-term current use of insulin  (HCC)   Relevant Orders   POCT CBG (Fasting - Glucose) (Completed)   Coronary artery disease involving native coronary artery of native heart without angina pectoris   Relevant Medications   carvedilol  (COREG ) 6.25 MG tablet   Hypertension associated with diabetes (HCC) - Primary   Relevant Medications   carvedilol  (COREG ) 6.25 MG tablet   Combined hyperlipidemia associated with type 2 diabetes mellitus (HCC)   Relevant Medications   carvedilol  (COREG ) 6.25 MG tablet   Chronic systolic congestive heart failure, NYHA class 1 (HCC)   Relevant Medications   carvedilol  (COREG )  6.25 MG tablet    Return in about 10 days (around 05/22/2024).   Total time spent: 30 minutes  Aisha Hove, MD  05/12/2024   This document may have been prepared by [facility] Voice Recognition software and as such may include unintentional dictation errors.

## 2024-05-13 ENCOUNTER — Other Ambulatory Visit: Payer: Self-pay | Admitting: Cardiovascular Disease

## 2024-05-13 ENCOUNTER — Other Ambulatory Visit: Payer: Self-pay | Admitting: Internal Medicine

## 2024-05-15 ENCOUNTER — Other Ambulatory Visit: Payer: Self-pay | Admitting: Cardiovascular Disease

## 2024-05-16 ENCOUNTER — Encounter (INDEPENDENT_AMBULATORY_CARE_PROVIDER_SITE_OTHER): Payer: Self-pay

## 2024-05-18 ENCOUNTER — Telehealth: Payer: Self-pay | Admitting: Internal Medicine

## 2024-05-18 NOTE — Telephone Encounter (Signed)
 Barba Levin, PT with [facility], left VM requesting verbal orders to extend patient's home health PT for 2w2.  Callback # [phone]

## 2024-05-22 ENCOUNTER — Ambulatory Visit (INDEPENDENT_AMBULATORY_CARE_PROVIDER_SITE_OTHER): Admitting: Internal Medicine

## 2024-05-22 ENCOUNTER — Encounter: Payer: Self-pay | Admitting: Internal Medicine

## 2024-05-22 VITALS — BP 160/86 | HR 65 | Ht 72.0 in | Wt 213.0 lb

## 2024-05-22 DIAGNOSIS — E1159 Type 2 diabetes mellitus with other circulatory complications: Secondary | ICD-10-CM

## 2024-05-22 DIAGNOSIS — I5022 Chronic systolic (congestive) heart failure: Secondary | ICD-10-CM

## 2024-05-22 DIAGNOSIS — I2510 Atherosclerotic heart disease of native coronary artery without angina pectoris: Secondary | ICD-10-CM

## 2024-05-22 DIAGNOSIS — I152 Hypertension secondary to endocrine disorders: Secondary | ICD-10-CM | POA: Diagnosis not present

## 2024-05-22 DIAGNOSIS — E1165 Type 2 diabetes mellitus with hyperglycemia: Secondary | ICD-10-CM | POA: Diagnosis not present

## 2024-05-22 MED ORDER — LISINOPRIL 5 MG PO TABS
5.0000 mg | ORAL_TABLET | Freq: Every day | ORAL | 6 refills | Status: DC
Start: 2024-05-22 — End: 2024-09-12

## 2024-05-22 NOTE — Progress Notes (Signed)
 Established Patient Office Visit  Subjective:  Patient ID: [name], male    DOB: [birth year]  Age: 78 y.o. MRN: [id number]  Chief Complaint  Patient presents with   Follow-up    10 day follow up    Patient comes in for his follow-up today accompanied by his wife.  He brings in his blood pressure readings from home with the highest being 156/85, rest are much lower.  In the office today his initial systolic blood pressure was 188, but repeat was 160/86.  Patient was recently discharged from [facility] after history of fall due to postural hypotension and almost all his medications were discontinued.  On his last visit his blood pressure was starting to rise and he was put back on Coreg  6.25 mg twice a day.  Although his blood pressure is high but his pulse remains low between 65-70.  Will start back on his Lisinopril  5 mg/day. Continue rest of medications-and monitoring blood pressure. Advised compression stockings, and to stand up slowly , and wait a few minutes before moving with a cane or walker. Patient is also getting Physical therapy at home.    No other concerns at this time.   Past Medical History:  Diagnosis Date   Back pain    Bladder cancer (HCC)    COVID-19 09/10/2020   Received Casirivimab/Irdevimab infusion on 09/11/20   Diabetes mellitus without complication (HCC)    HOH (hard of hearing)    Hypertension    Obesity    Renal cancer (HCC)    Renal cell cancer (HCC)    Throat cancer (HCC)    Tremor     Past Surgical History:  Procedure Laterality Date   APPENDECTOMY     BLADDER SURGERY     CAROTID PTA/STENT INTERVENTION Right 08/26/2021   Procedure: CAROTID PTA/STENT INTERVENTION;  Surgeon: Jackquelyn Mass, MD;  Location: [name] INVASIVE CV [facility];  Service: Cardiovascular;  Laterality: Right;   CATARACT EXTRACTION W/PHACO Right 01/21/2023   Procedure: CATARACT EXTRACTION PHACO AND INTRAOCULAR LENS PLACEMENT (IOC) RIGHT DIABETIC  4.89  00:45.6;  Surgeon: Trudi Fus, MD;  Location: [facility];  Service: Ophthalmology;  Laterality: Right;  Diabetic   CERVICAL SPINE SURGERY     CHOLECYSTECTOMY N/A 03/23/2018   Procedure: LAPAROSCOPIC CHOLECYSTECTOMY;  Surgeon: Eldred Grego, MD;  Location: [name] ORS;  Service: General;  Laterality: N/A;   COLONOSCOPY     COLONOSCOPY WITH PROPOFOL  N/A 11/10/2017   Procedure: COLONOSCOPY WITH PROPOFOL ;  Surgeon: Toledo, Alphonsus Jeans, MD;  Location: [name] ENDOSCOPY;  Service: Gastroenterology;  Laterality: N/A;   ESOPHAGOGASTRODUODENOSCOPY (EGD) WITH PROPOFOL  N/A 11/10/2017   Procedure: ESOPHAGOGASTRODUODENOSCOPY (EGD) WITH PROPOFOL ;  Surgeon: Toledo, Alphonsus Jeans, MD;  Location: [name] ENDOSCOPY;  Service: Gastroenterology;  Laterality: N/A;   HERNIA REPAIR     left od surgery     MICROLARYNGOSCOPY N/A 11/05/2020   Procedure: MICROLARYNGOSCOPY WITH BIOPSIES;  Surgeon: Von Grumbling, MD;  Location: [facility];  Service: ENT;  Laterality: N/A;   NEPHRECTOMY     right kidney nephrectomy   rt elbow surgery     THROAT SURGERY  1990   for cancer removal at [facility]    Social History   Socioeconomic History   Marital status: Married    Spouse name: Not on file   Number of children: Not on file   Years of education: Not on file   Highest education level: Not on file  Occupational History   Not on file  Tobacco Use   Smoking status: Former    Current packs/day: 0.00    Types: Cigarettes    Quit date: 1991    Years since quitting: 34.4   Smokeless tobacco: Never  Vaping Use   Vaping status: Never Used  Substance and Sexual Activity   Alcohol use: Not Currently    Comment: Occasional beer drinker   Drug use: No   Sexual activity: Yes  Other Topics Concern   Not on file  Social History Narrative   Lives at home with wife, active and independent at baseline   Social Drivers of Health   Financial Resource Strain: Not on file  Food Insecurity: No Food Insecurity (04/26/2024)    Hunger Vital Sign    Worried About Running Out of Food in the Last Year: Never true    Ran Out of Food in the Last Year: Never true  Transportation Needs: No Transportation Needs (04/26/2024)   PRAPARE - Administrator, Civil Service (Medical): No    Lack of Transportation (Non-Medical): No  Physical Activity: Not on file  Stress: Not on file  Social Connections: Socially Integrated (04/26/2024)   Social Connection and Isolation Panel [NHANES]    Frequency of Communication with Friends and Family: More than three times a week    Frequency of Social Gatherings with Friends and Family: Twice a week    Attends Religious Services: More than 4 times per year    Active Member of [employer] or Organizations: Yes    Attends Engineer, structural: More than 4 times per year    Marital Status: Married  Catering manager Violence: Not At Risk (04/26/2024)   Humiliation, Afraid, Rape, and Kick questionnaire    Fear of Current or Ex-Partner: No    Emotionally Abused: No    Physically Abused: No    Sexually Abused: No    Family History  Problem Relation Age of Onset   Diabetes Brother    Hypertension Brother    CAD Brother    CAD Brother     Allergies  Allergen Reactions   Metformin Diarrhea    Outpatient Medications Prior to Visit  Medication Sig   acetaminophen  (TYLENOL ) 500 MG tablet Take 500 mg by mouth every 6 (six) hours as needed.   amitriptyline  (ELAVIL ) 25 MG tablet TAKE 2 TABLETS BY MOUTH AT BEDTIME   aspirin  EC 81 MG tablet Take 81 mg by mouth daily. Swallow whole.   benzonatate (TESSALON) 100 MG capsule Take 1 capsule by mouth three times daily as needed for cough   carvedilol  (COREG ) 6.25 MG tablet Take 1 tablet (6.25 mg total) by mouth 2 (two) times daily.   cetirizine  (ZYRTEC  ALLERGY) 10 MG tablet Take 1 tablet (10 mg total) by mouth daily.   chlorthalidone  (HYGROTON ) 25 MG tablet Take 1 tablet by mouth once daily   Cholecalciferol  50 MCG (2000 UT) CAPS Take  2,000 Units by mouth daily.   fluticasone  (FLONASE ) 50 MCG/ACT nasal spray Use 2 spray(s) in each nostril once daily   glimepiride  (AMARYL ) 2 MG tablet Take 1 tablet (2 mg total) by mouth 2 (two) times daily.   insulin  NPH-regular Human (NOVOLIN 70/30) (70-30) 100 UNIT/ML injection Inject 30 Units into the skin 2 (two) times daily with a meal.   ONETOUCH ULTRA TEST test strip USE TO CHECK BLOOD SUGAR THREE TIMES DAILY   primidone  (MYSOLINE ) 50 MG tablet TAKE 1 TABLET BY MOUTH AT BEDTIME   rosuvastatin  (CRESTOR ) 40 MG tablet  TAKE 1 TABLET BY MOUTH ONCE DAILY. REPLACES ATORVASTATIN   Facility-Administered Medications Prior to Visit  Medication Dose Route Frequency Provider   technetium sestamibi generic (CARDIOLITE ) injection 10 millicurie  10 millicurie Intravenous Once PRN Debborah Fairly A, MD    ROS     Objective:   BP (!) 160/86   Pulse 65   Ht 6' (1.829 m)   Wt 213 lb (96.6 kg)   SpO2 98%   BMI 28.89 kg/m   Vitals:   05/22/24 1109 05/22/24 1139  BP: (!) 188/96 (!) 160/86  Pulse: 68 65  Height: 6' (1.829 m)   Weight: 213 lb (96.6 kg)   SpO2: 98%   BMI (Calculated): 28.88     Physical Exam   No results found for any visits on 05/22/24.  Recent Results (from the past 2160 hours)  POCT CBG (Fasting - Glucose)     Status: Abnormal   Collection Time: 04/13/24  9:55 AM  Result Value Ref Range   Glucose Fasting, POC 136 (A) 70 - 99 mg/dL  POCT Urinalysis Dipstick (60454)     Status: Abnormal   Collection Time: 04/13/24 10:15 AM  Result Value Ref Range   Color, UA Yellow    Clarity, UA Clear    Glucose, UA Positive (A) Negative   Bilirubin, UA Negative    Ketones, UA Negative    Spec Grav, UA 1.010 1.010 - 1.025   Blood, UA Large    pH, UA 6.0 5.0 - 8.0   Protein, UA Negative Negative   Urobilinogen, UA 0.2 0.2 or 1.0 E.U./dL   Nitrite, UA Negative    Leukocytes, UA Negative Negative   Appearance Clear    Odor Yes   POC CREATINE & ALBUMIN,URINE     Status:  None   Collection Time: 04/13/24 10:15 AM  Result Value Ref Range   Microalbumin Ur, POC 30 mg/L   Creatinine, POC 100 mg/dL   Albumin/Creatinine Ratio, Urine, POC <30   CBC with Diff     Status: Abnormal   Collection Time: 04/13/24 10:35 AM  Result Value Ref Range   WBC 9.5 3.4 - 10.8 x10E3/uL   RBC 6.09 (H) 4.14 - 5.80 x10E6/uL   Hemoglobin 15.7 13.0 - 17.7 g/dL   Hematocrit 09.8 11.9 - 51.0 %   MCV 81 79 - 97 fL   MCH 25.8 (L) 26.6 - 33.0 pg   MCHC 31.7 31.5 - 35.7 g/dL   RDW 14.7 (H) 82.9 - 56.2 %   Platelets 150 150 - 450 x10E3/uL   Neutrophils 72 Not Estab. %   Lymphs 13 Not Estab. %   Monocytes 7 Not Estab. %   Eos 7 Not Estab. %   Basos 1 Not Estab. %   Neutrophils Absolute 6.8 1.4 - 7.0 x10E3/uL   Lymphocytes Absolute 1.3 0.7 - 3.1 x10E3/uL   Monocytes Absolute 0.7 0.1 - 0.9 x10E3/uL   EOS (ABSOLUTE) 0.7 (H) 0.0 - 0.4 x10E3/uL   Basophils Absolute 0.1 0.0 - 0.2 x10E3/uL   Immature Granulocytes 0 Not Estab. %   Immature Grans (Abs) 0.0 0.0 - 0.1 x10E3/uL  CMP14+EGFR     Status: Abnormal   Collection Time: 04/13/24 10:35 AM  Result Value Ref Range   Glucose 116 (H) 70 - 99 mg/dL   BUN 18 8 - 27 mg/dL   Creatinine, Ser 1.30 0.76 - 1.27 mg/dL   eGFR 68 >86 VH/QIO/9.62   BUN/Creatinine Ratio 16 10 - 24   Sodium 141  134 - 144 mmol/L   Potassium 4.2 3.5 - 5.2 mmol/L   Chloride 102 96 - 106 mmol/L   CO2 20 20 - 29 mmol/L   Calcium  12.0 (H) 8.6 - 10.2 mg/dL   Total Protein 7.2 6.0 - 8.5 g/dL   Albumin 4.6 3.8 - 4.8 g/dL   Globulin, Total 2.6 1.5 - 4.5 g/dL   Bilirubin Total 0.5 0.0 - 1.2 mg/dL   Alkaline Phosphatase 72 44 - 121 IU/L   AST 15 0 - 40 IU/L   ALT 12 0 - 44 IU/L  Lipid Panel w/o Chol/HDL Ratio     Status: None   Collection Time: 04/13/24 10:35 AM  Result Value Ref Range   Cholesterol, Total 131 100 - 199 mg/dL   Triglycerides 95 0 - 149 mg/dL   HDL 41 >04 mg/dL   VLDL Cholesterol Cal 18 5 - 40 mg/dL   LDL Chol Calc (NIH) 72 0 - 99 mg/dL   Hemoglobin V4U     Status: Abnormal   Collection Time: 04/13/24 10:35 AM  Result Value Ref Range   Hgb A1c MFr Bld 6.7 (H) 4.8 - 5.6 %    Comment:          Prediabetes: 5.7 - 6.4          Diabetes: >6.4          Glycemic control for adults with diabetes: <7.0    Est. average glucose Bld gHb Est-mCnc 146 mg/dL  CBC with Differential     Status: Abnormal   Collection Time: 04/25/24 11:54 AM  Result Value Ref Range   WBC 7.4 4.0 - 10.5 K/uL   RBC 6.11 (H) 4.22 - 5.81 MIL/uL   Hemoglobin 15.5 13.0 - 17.0 g/dL   HCT 98.1 19.1 - 47.8 %   MCV 78.6 (L) 80.0 - 100.0 fL   MCH 25.4 (L) 26.0 - 34.0 pg   MCHC 32.3 30.0 - 36.0 g/dL   RDW 29.5 (H) 62.1 - 30.8 %   Platelets 112 (L) 150 - 400 K/uL   nRBC 0.0 0.0 - 0.2 %   Neutrophils Relative % 86 %   Neutro Abs 6.3 1.7 - 7.7 K/uL   Lymphocytes Relative 4 %   Lymphs Abs 0.3 (L) 0.7 - 4.0 K/uL   Monocytes Relative 6 %   Monocytes Absolute 0.5 0.1 - 1.0 K/uL   Eosinophils Relative 3 %   Eosinophils Absolute 0.2 0.0 - 0.5 K/uL   Basophils Relative 1 %   Basophils Absolute 0.0 0.0 - 0.1 K/uL   Immature Granulocytes 0 %   Abs Immature Granulocytes 0.03 0.00 - 0.07 K/uL    Comment: Performed at [facility], [address] [place], [place] [address]  Comprehensive metabolic panel     Status: Abnormal   Collection Time: 04/25/24 11:54 AM  Result Value Ref Range   Sodium 132 (L) 135 - 145 mmol/L   Potassium 4.0 3.5 - 5.1 mmol/L   Chloride 98 98 - 111 mmol/L   CO2 22 22 - 32 mmol/L   Glucose, Bld 179 (H) 70 - 99 mg/dL    Comment: Glucose reference range applies only to samples taken after fasting for at least 8 hours.   BUN 25 (H) 8 - 23 mg/dL   Creatinine, Ser 6.96 0.61 - 1.24 mg/dL   Calcium  11.2 (H) 8.9 - 10.3 mg/dL   Total Protein 7.2 6.5 - 8.1 g/dL   Albumin 4.0 3.5 - 5.0 g/dL  AST 30 15 - 41 U/L   ALT 23 0 - 44 U/L   Alkaline Phosphatase 58 38 - 126 U/L   Total Bilirubin 1.3 (H) 0.0 - 1.2 mg/dL   GFR, Estimated >40 >98  mL/min    Comment: (NOTE) Calculated using the CKD-EPI Creatinine Equation (2021)    Anion gap 12 5 - 15    Comment: Performed at [facility], [address] [place], [place] [address]  Brain natriuretic peptide     Status: None   Collection Time: 04/25/24 11:54 AM  Result Value Ref Range   B Natriuretic Peptide 36.4 0.0 - 100.0 pg/mL    Comment: Performed at [facility], [address] [place], [place] [address]  Resp panel by RT-PCR (RSV, Flu A&B, Covid) Anterior Nasal Swab     Status: None   Collection Time: 04/25/24  4:21 PM   Specimen: Anterior Nasal Swab  Result Value Ref Range   SARS Coronavirus 2 by RT PCR NEGATIVE NEGATIVE    Comment: (NOTE) SARS-CoV-2 target nucleic acids are NOT DETECTED.  The SARS-CoV-2 RNA is generally detectable in upper respiratory specimens during the acute phase of infection. The lowest concentration of SARS-CoV-2 viral copies this assay can detect is 138 copies/mL. A negative result does not preclude SARS-Cov-2 infection and should not be used as the sole basis for treatment or other patient management decisions. A negative result may occur with  improper specimen collection/handling, submission of specimen other than nasopharyngeal swab, presence of viral mutation(s) within the areas targeted by this assay, and inadequate number of viral copies(<138 copies/mL). A negative result must be combined with clinical observations, patient history, and epidemiological information. The expected result is Negative.  Fact Sheet for Patients:  BloggerCourse.com  Fact Sheet for Healthcare Providers:  SeriousBroker.it  This test is no t yet approved or cleared by the [place]  FDA and  has been authorized for detection and/or diagnosis of SARS-CoV-2 by FDA under an Emergency Use Authorization (EUA). This EUA will remain  in effect (meaning this test can be used) for the duration  of the COVID-19 declaration under Section 564(b)(1) of the Act, 21 U.S.C.section 360bbb-3(b)(1), unless the authorization is terminated  or revoked sooner.       Influenza A by PCR NEGATIVE NEGATIVE   Influenza B by PCR NEGATIVE NEGATIVE    Comment: (NOTE) The Xpert Xpress SARS-CoV-2/FLU/RSV plus assay is intended as an aid in the diagnosis of influenza from Nasopharyngeal swab specimens and should not be used as a sole basis for treatment. Nasal washings and aspirates are unacceptable for Xpert Xpress SARS-CoV-2/FLU/RSV testing.  Fact Sheet for Patients: BloggerCourse.com  Fact Sheet for Healthcare Providers: SeriousBroker.it  This test is not yet approved or cleared by the [place]  FDA and has been authorized for detection and/or diagnosis of SARS-CoV-2 by FDA under an Emergency Use Authorization (EUA). This EUA will remain in effect (meaning this test can be used) for the duration of the COVID-19 declaration under Section 564(b)(1) of the Act, 21 U.S.C. section 360bbb-3(b)(1), unless the authorization is terminated or revoked.     Resp Syncytial Virus by PCR NEGATIVE NEGATIVE    Comment: (NOTE) Fact Sheet for Patients: BloggerCourse.com  Fact Sheet for Healthcare Providers: SeriousBroker.it  This test is not yet approved or cleared by the [place]  FDA and has been authorized for detection and/or diagnosis of SARS-CoV-2 by FDA under an Emergency Use Authorization (EUA). This EUA will remain in effect (meaning this test can  be used) for the duration of the COVID-19 declaration under Section 564(b)(1) of the Act, 21 U.S.C. section 360bbb-3(b)(1), unless the authorization is terminated or revoked.  Performed at [facility], [address] [place], [place] [address]   Urinalysis, w/ Reflex to Culture (Infection Suspected) -Urine, Clean Catch      Status: Abnormal   Collection Time: 04/25/24  4:21 PM  Result Value Ref Range   Specimen Source URINE, CLEAN CATCH    Color, Urine YELLOW (A) YELLOW   APPearance CLEAR (A) CLEAR   Specific Gravity, Urine 1.044 (H) 1.005 - 1.030   pH 7.0 5.0 - 8.0   Glucose, UA >=500 (A) NEGATIVE mg/dL   Hgb urine dipstick SMALL (A) NEGATIVE   Bilirubin Urine NEGATIVE NEGATIVE   Ketones, ur NEGATIVE NEGATIVE mg/dL   Protein, ur NEGATIVE NEGATIVE mg/dL   Nitrite NEGATIVE NEGATIVE   Leukocytes,Ua NEGATIVE NEGATIVE   RBC / HPF 0-5 0 - 5 RBC/hpf   WBC, UA 0-5 0 - 5 WBC/hpf    Comment:        Reflex urine culture not performed if WBC <=10, OR if Squamous epithelial cells >5. If Squamous epithelial cells >5 suggest recollection.    Bacteria, UA NONE SEEN NONE SEEN   Squamous Epithelial / HPF 0-5 0 - 5 /HPF    Comment: Performed at [facility], [address] [place], [place] [address]  CBG monitoring, ED     Status: None   Collection Time: 04/25/24 10:28 PM  Result Value Ref Range   Glucose-Capillary 83 70 - 99 mg/dL    Comment: Glucose reference range applies only to samples taken after fasting for at least 8 hours.  Basic metabolic panel     Status: Abnormal   Collection Time: 04/26/24  5:48 AM  Result Value Ref Range   Sodium 133 (L) 135 - 145 mmol/L   Potassium 3.5 3.5 - 5.1 mmol/L   Chloride 100 98 - 111 mmol/L   CO2 21 (L) 22 - 32 mmol/L   Glucose, Bld 91 70 - 99 mg/dL    Comment: Glucose reference range applies only to samples taken after fasting for at least 8 hours.   BUN 25 (H) 8 - 23 mg/dL   Creatinine, Ser 8.29 0.61 - 1.24 mg/dL   Calcium  10.2 8.9 - 10.3 mg/dL   GFR, Estimated >56 >21 mL/min    Comment: (NOTE) Calculated using the CKD-EPI Creatinine Equation (2021)    Anion gap 12 5 - 15    Comment: Performed at [facility], [address] [place], [place] [address]  CBC     Status: Abnormal   Collection Time: 04/26/24  5:48 AM  Result Value Ref Range    WBC 10.9 (H) 4.0 - 10.5 K/uL   RBC 5.69 4.22 - 5.81 MIL/uL   Hemoglobin 14.4 13.0 - 17.0 g/dL   HCT 78.4 69.6 - 29.5 %   MCV 77.3 (L) 80.0 - 100.0 fL   MCH 25.3 (L) 26.0 - 34.0 pg   MCHC 32.7 30.0 - 36.0 g/dL   RDW 28.4 (H) 13.2 - 44.0 %   Platelets 112 (L) 150 - 400 K/uL   nRBC 0.0 0.0 - 0.2 %    Comment: Performed at [facility], [address] [place], [place] [address]  CBG monitoring, ED     Status: Abnormal   Collection Time: 04/26/24  8:26 AM  Result Value Ref Range   Glucose-Capillary 102 (H) 70 - 99  mg/dL    Comment: Glucose reference range applies only to samples taken after fasting for at least 8 hours.  Glucose, capillary     Status: Abnormal   Collection Time: 04/26/24 12:24 PM  Result Value Ref Range   Glucose-Capillary 129 (H) 70 - 99 mg/dL    Comment: Glucose reference range applies only to samples taken after fasting for at least 8 hours.  Glucose, capillary     Status: Abnormal   Collection Time: 04/26/24  5:35 PM  Result Value Ref Range   Glucose-Capillary 157 (H) 70 - 99 mg/dL    Comment: Glucose reference range applies only to samples taken after fasting for at least 8 hours.  Glucose, capillary     Status: Abnormal   Collection Time: 04/26/24  9:37 PM  Result Value Ref Range   Glucose-Capillary 150 (H) 70 - 99 mg/dL    Comment: Glucose reference range applies only to samples taken after fasting for at least 8 hours.  Basic metabolic panel with GFR     Status: Abnormal   Collection Time: 04/27/24  4:08 AM  Result Value Ref Range   Sodium 136 135 - 145 mmol/L   Potassium 3.6 3.5 - 5.1 mmol/L   Chloride 104 98 - 111 mmol/L   CO2 27 22 - 32 mmol/L   Glucose, Bld 89 70 - 99 mg/dL    Comment: Glucose reference range applies only to samples taken after fasting for at least 8 hours.   BUN 35 (H) 8 - 23 mg/dL   Creatinine, Ser 4.09 0.61 - 1.24 mg/dL   Calcium  9.7 8.9 - 10.3 mg/dL   GFR, Estimated >81 >19 mL/min    Comment: (NOTE) Calculated using  the CKD-EPI Creatinine Equation (2021)    Anion gap 5 5 - 15    Comment: Performed at [facility], [address] [place], [place] [address]  CBC     Status: Abnormal   Collection Time: 04/27/24  4:08 AM  Result Value Ref Range   WBC 8.2 4.0 - 10.5 K/uL   RBC 5.41 4.22 - 5.81 MIL/uL   Hemoglobin 13.8 13.0 - 17.0 g/dL   HCT 95.6 21.3 - 08.6 %   MCV 78.6 (L) 80.0 - 100.0 fL   MCH 25.5 (L) 26.0 - 34.0 pg   MCHC 32.5 30.0 - 36.0 g/dL   RDW 57.8 (H) 46.9 - 62.9 %   Platelets 113 (L) 150 - 400 K/uL   nRBC 0.0 0.0 - 0.2 %    Comment: Performed at [facility], [address] [place], [place] [address]  Magnesium     Status: None   Collection Time: 04/27/24  4:08 AM  Result Value Ref Range   Magnesium 2.3 1.7 - 2.4 mg/dL    Comment: Performed at [facility], [address] [place], [place] [address]  Glucose, capillary     Status: Abnormal   Collection Time: 04/27/24  7:38 AM  Result Value Ref Range   Glucose-Capillary 105 (H) 70 - 99 mg/dL    Comment: Glucose reference range applies only to samples taken after fasting for at least 8 hours.  Glucose, capillary     Status: Abnormal   Collection Time: 04/27/24 11:21 AM  Result Value Ref Range   Glucose-Capillary 141 (H) 70 - 99 mg/dL    Comment: Glucose reference range applies only to samples taken after fasting for at least 8 hours.  POCT CBG (Fasting - Glucose)  Status: Abnormal   Collection Time: 05/12/24  3:00 PM  Result Value Ref Range   Glucose Fasting, POC 162 (A) 70 - 99 mg/dL      Assessment & Plan:  Add Lisinopril  , continue Coreg  and diuretic. Monitor BP. Wear compression stockings. Problem List Items Addressed This Visit     Type 2 diabetes mellitus with hyperglycemia, without long-term current use of insulin  (HCC)   Relevant Medications   lisinopril  (ZESTRIL ) 5 MG tablet   Coronary artery disease involving native coronary artery of native heart without angina pectoris    Relevant Medications   lisinopril  (ZESTRIL ) 5 MG tablet   Hypertension associated with diabetes (HCC) - Primary   Relevant Medications   lisinopril  (ZESTRIL ) 5 MG tablet   Chronic systolic congestive heart failure, NYHA class 1 (HCC)   Relevant Medications   lisinopril  (ZESTRIL ) 5 MG tablet    Return in about 2 weeks (around 06/05/2024).   Total time spent: 30 minutes  Aisha Hove, MD  05/22/2024   This document may have been prepared by [facility] Voice Recognition software and as such may include unintentional dictation errors.

## 2024-05-23 DIAGNOSIS — C44319 Basal cell carcinoma of skin of other parts of face: Secondary | ICD-10-CM | POA: Diagnosis not present

## 2024-05-23 NOTE — Telephone Encounter (Signed)
 Verbal orders given to [facility].

## 2024-05-24 ENCOUNTER — Other Ambulatory Visit: Payer: Self-pay | Admitting: Family

## 2024-06-05 ENCOUNTER — Encounter: Payer: Self-pay | Admitting: Internal Medicine

## 2024-06-05 ENCOUNTER — Ambulatory Visit (INDEPENDENT_AMBULATORY_CARE_PROVIDER_SITE_OTHER): Admitting: Internal Medicine

## 2024-06-05 ENCOUNTER — Ambulatory Visit: Payer: Self-pay | Admitting: Internal Medicine

## 2024-06-05 VITALS — BP 169/105 | HR 80 | Ht 72.0 in | Wt 214.2 lb

## 2024-06-05 DIAGNOSIS — E1169 Type 2 diabetes mellitus with other specified complication: Secondary | ICD-10-CM

## 2024-06-05 DIAGNOSIS — I152 Hypertension secondary to endocrine disorders: Secondary | ICD-10-CM

## 2024-06-05 DIAGNOSIS — E1159 Type 2 diabetes mellitus with other circulatory complications: Secondary | ICD-10-CM | POA: Diagnosis not present

## 2024-06-05 DIAGNOSIS — E782 Mixed hyperlipidemia: Secondary | ICD-10-CM | POA: Diagnosis not present

## 2024-06-05 DIAGNOSIS — I2510 Atherosclerotic heart disease of native coronary artery without angina pectoris: Secondary | ICD-10-CM

## 2024-06-05 DIAGNOSIS — E1165 Type 2 diabetes mellitus with hyperglycemia: Secondary | ICD-10-CM

## 2024-06-05 LAB — POCT CBG (FASTING - GLUCOSE)-MANUAL ENTRY: Glucose Fasting, POC: 137 mg/dL — AB (ref 70–99)

## 2024-06-05 NOTE — Progress Notes (Signed)
 Established Patient Office Visit  Subjective:  Patient ID: [name], male    DOB: 1946/05/31  Age: 78 y.o. MRN: [id number]  Chief Complaint  Patient presents with   Follow-up    2 week follow up    Patient comes in for his follow-up today.  He is starting to feel better and has more energy.  However his blood pressure is now staying up, despite being on Coreg  and lisinopril .  Advised to resume his Jardiance  and Flomax  now.  Will continue to monitor.  If needed will increase the dose of lisinopril . Patient reports that he passed another kidney stone yesterday. Otherwise feels well, no headaches or dizziness, no chest pain and no shortness of breath.    No other concerns at this time.   Past Medical History:  Diagnosis Date   Back pain    Bladder cancer (HCC)    COVID-19 09/10/2020   Received Casirivimab/Irdevimab infusion on 09/11/20   Diabetes mellitus without complication (HCC)    HOH (hard of hearing)    Hypertension    Obesity    Renal cancer (HCC)    Renal cell cancer (HCC)    Throat cancer (HCC)    Tremor     Past Surgical History:  Procedure Laterality Date   APPENDECTOMY     BLADDER SURGERY     CAROTID PTA/STENT INTERVENTION Right 08/26/2021   Procedure: CAROTID PTA/STENT INTERVENTION;  Surgeon: Jackquelyn Mass, MD;  Location: [name];  Service: Cardiovascular;  Laterality: Right;   CATARACT EXTRACTION W/PHACO Right 01/21/2023   Procedure: CATARACT EXTRACTION PHACO AND INTRAOCULAR LENS PLACEMENT (IOC) RIGHT DIABETIC  4.89  00:45.6;  Surgeon: Trudi Fus, MD;  Location: [facility];  Service: Ophthalmology;  Laterality: Right;  Diabetic   CERVICAL SPINE SURGERY     CHOLECYSTECTOMY N/A 03/23/2018   Procedure: LAPAROSCOPIC CHOLECYSTECTOMY;  Surgeon: Eldred Grego, MD;  Location: [name];  Service: General;  Laterality: N/A;   COLONOSCOPY     COLONOSCOPY WITH PROPOFOL  N/A 11/10/2017   Procedure: COLONOSCOPY WITH  PROPOFOL ;  Surgeon: Toledo, Alphonsus Jeans, MD;  Location: [name];  Service: Gastroenterology;  Laterality: N/A;   ESOPHAGOGASTRODUODENOSCOPY (EGD) WITH PROPOFOL  N/A 11/10/2017   Procedure: ESOPHAGOGASTRODUODENOSCOPY (EGD) WITH PROPOFOL ;  Surgeon: Toledo, Alphonsus Jeans, MD;  Location: [name] ENDOS[facility];  Service: Gastroenterology;  Laterality: N/A;   HERNIA REPAIR     left od surgery     MICROLARYNGOSCOPY N/A 11/05/2020   Procedure: MICROLARYNGOSCOPY WITH BIOPSIES;  Surgeon: Von Grumbling, MD;  Location: [facility];  Service: ENT;  Laterality: N/A;   NEPHRECTOMY     right kidney nephrectomy   rt elbow surgery     THROAT SURGERY  1990   for cancer removal at [facility]    Social History   Socioeconomic History   Marital status: Married    Spouse name: Not on file   Number of children: Not on file   Years of education: Not on file   Highest education level: Not on file  Occupational History   Not on file  Tobacco Use   Smoking status: Former    Current packs/day: 0.00    Types: Cigarettes    Quit date: 1991    Years since quitting: 34.4   Smokeless tobacco: Never  Vaping Use   Vaping status: Never Used  Substance and Sexual Activity   Alcohol use: Not Currently    Comment: Occasional beer drinker   Drug use: No   Sexual  activity: Yes  Other Topics Concern   Not on file  Social History Narrative   Lives at home with wife, active and independent at baseline   Social Drivers of Health   Financial Resource Strain: Not on file  Food Insecurity: No Food Insecurity (04/26/2024)   Hunger Vital Sign    Worried About Running Out of Food in the Last Year: Never true    Ran Out of Food in the Last Year: Never true  Transportation Needs: No Transportation Needs (04/26/2024)   PRAPARE - Administrator, Civil Service (Medical): No    Lack of Transportation (Non-Medical): No  Physical Activity: Not on file  Stress: Not on file  Social Connections: Socially  Integrated (04/26/2024)   Social Connection and Isolation Panel [NHANES]    Frequency of Communication with Friends and Family: More than three times a week    Frequency of Social Gatherings with Friends and Family: Twice a week    Attends Religious Services: More than 4 times per year    Active Member of [employer] or Organizations: Yes    Attends Engineer, structural: More than 4 times per year    Marital Status: Married  Catering manager Violence: Not At Risk (04/26/2024)   Humiliation, Afraid, Rape, and Kick questionnaire    Fear of Current or Ex-Partner: No    Emotionally Abused: No    Physically Abused: No    Sexually Abused: No    Family History  Problem Relation Age of Onset   Diabetes Brother    Hypertension Brother    CAD Brother    CAD Brother     Allergies  Allergen Reactions   Metformin Diarrhea    Outpatient Medications Prior to Visit  Medication Sig   acetaminophen  (TYLENOL ) 500 MG tablet Take 500 mg by mouth every 6 (six) hours as needed.   amitriptyline  (ELAVIL ) 25 MG tablet TAKE 2 TABLETS BY MOUTH AT BEDTIME   aspirin  EC 81 MG tablet Take 81 mg by mouth daily. Swallow whole.   benzonatate (TESSALON) 100 MG capsule Take 1 capsule by mouth three times daily as needed for cough   carvedilol  (COREG ) 6.25 MG tablet Take 1 tablet (6.25 mg total) by mouth 2 (two) times daily.   cetirizine  (ZYRTEC  ALLERGY) 10 MG tablet Take 1 tablet (10 mg total) by mouth daily.   chlorthalidone  (HYGROTON ) 25 MG tablet Take 1 tablet by mouth once daily   Cholecalciferol  50 MCG (2000 UT) CAPS Take 2,000 Units by mouth daily.   fluticasone  (FLONASE ) 50 MCG/ACT nasal spray Use 2 spray(s) in each nostril once daily   glimepiride  (AMARYL ) 2 MG tablet Take 1 tablet (2 mg total) by mouth 2 (two) times daily.   insulin  NPH-regular Human (NOVOLIN 70/30) (70-30) 100 UNIT/ML injection Inject 30 Units into the skin 2 (two) times daily with a meal.   lisinopril  (ZESTRIL ) 5 MG tablet Take 1  tablet (5 mg total) by mouth daily.   ONETOUCH ULTRA TEST test strip USE TO CHECK BLOOD SUGAR THREE TIMES DAILY   primidone  (MYSOLINE ) 50 MG tablet TAKE 1 TABLET BY MOUTH AT BEDTIME   rosuvastatin  (CRESTOR ) 40 MG tablet TAKE 1 TABLET BY MOUTH ONCE DAILY. REPLACES ATORVASTATIN   Facility-Administered Medications Prior to Visit  Medication Dose Route Frequency Provider   technetium sestamibi generic (CARDIOLITE ) injection 10 millicurie  10 millicurie Intravenous Once PRN Cherrie Cornwall, MD    Review of Systems  Constitutional: Negative.  Negative for chills, diaphoresis,  fever, malaise/fatigue and weight loss.  HENT: Negative.  Negative for sore throat.   Eyes: Negative.   Respiratory: Negative.  Negative for cough and shortness of breath.   Cardiovascular: Negative.  Negative for chest pain, palpitations and leg swelling.  Gastrointestinal: Negative.  Negative for abdominal pain, constipation, diarrhea, heartburn, nausea and vomiting.  Genitourinary: Negative.  Negative for dysuria and flank pain.  Musculoskeletal: Negative.  Negative for joint pain and myalgias.  Skin: Negative.   Neurological: Negative.  Negative for dizziness, tingling, tremors and headaches.  Endo/Heme/Allergies: Negative.   Psychiatric/Behavioral: Negative.  Negative for depression and suicidal ideas. The patient is not nervous/anxious.        Objective:   BP (!) 169/105 Comment: personal automatic BP monitor  Pulse 80   Ht 6' (1.829 m)   Wt 214 lb 3.2 oz (97.2 kg)   SpO2 98%   BMI 29.05 kg/m   Vitals:   06/05/24 1107 06/05/24 1112  BP: (!) 146/86 Comment: manual BP (!) 169/105 Comment: personal automatic BP monitor  Pulse: 80   Height: 6' (1.829 m)   Weight: 214 lb 3.2 oz (97.2 kg)   SpO2: 98%   BMI (Calculated): 29.04     Physical Exam Vitals and nursing note reviewed.  Constitutional:      Appearance: Normal appearance.  HENT:     Head: Normocephalic and atraumatic.     Nose: Nose  normal.     Mouth/Throat:     Mouth: Mucous membranes are moist.     Pharynx: Oropharynx is clear.  Eyes:     Conjunctiva/sclera: Conjunctivae normal.     Pupils: Pupils are equal, round, and reactive to light.  Cardiovascular:     Rate and Rhythm: Normal rate and regular rhythm.     Pulses: Normal pulses.     Heart sounds: Normal heart sounds.  Pulmonary:     Effort: Pulmonary effort is normal.     Breath sounds: Normal breath sounds.  Abdominal:     General: Bowel sounds are normal.     Palpations: Abdomen is soft.  Musculoskeletal:        General: Normal range of motion.     Cervical back: Normal range of motion.  Skin:    General: Skin is warm and dry.  Neurological:     General: No focal deficit present.     Mental Status: He is alert and oriented to person, place, and time.  Psychiatric:        Mood and Affect: Mood normal.        Behavior: Behavior normal.        Judgment: Judgment normal.      Results for orders placed or performed in visit on 06/05/24  POCT CBG (Fasting - Glucose)  Result Value Ref Range   Glucose Fasting, POC 137 (A) 70 - 99 mg/dL    Recent Results (from the past 2160 hours)  POCT CBG (Fasting - Glucose)     Status: Abnormal   Collection Time: 04/13/24  9:55 AM  Result Value Ref Range   Glucose Fasting, POC 136 (A) 70 - 99 mg/dL  POCT Urinalysis Dipstick (16109)     Status: Abnormal   Collection Time: 04/13/24 10:15 AM  Result Value Ref Range   Color, UA Yellow    Clarity, UA Clear    Glucose, UA Positive (A) Negative   Bilirubin, UA Negative    Ketones, UA Negative    Spec Grav, UA 1.010 1.010 - 1.025  Blood, UA Large    pH, UA 6.0 5.0 - 8.0   Protein, UA Negative Negative   Urobilinogen, UA 0.2 0.2 or 1.0 E.U./dL   Nitrite, UA Negative    Leukocytes, UA Negative Negative   Appearance Clear    Odor Yes   POC CREATINE & ALBUMIN,URINE     Status: None   Collection Time: 04/13/24 10:15 AM  Result Value Ref Range   Microalbumin  Ur, POC 30 mg/L   Creatinine, POC 100 mg/dL   Albumin/Creatinine Ratio, Urine, POC <30   CBC with Diff     Status: Abnormal   Collection Time: 04/13/24 10:35 AM  Result Value Ref Range   WBC 9.5 3.4 - 10.8 x10E3/uL   RBC 6.09 (H) 4.14 - 5.80 x10E6/uL   Hemoglobin 15.7 13.0 - 17.7 g/dL   Hematocrit 16.1 09.6 - 51.0 %   MCV 81 79 - 97 fL   MCH 25.8 (L) 26.6 - 33.0 pg   MCHC 31.7 31.5 - 35.7 g/dL   RDW 04.5 (H) 40.9 - 81.1 %   Platelets 150 150 - 450 x10E3/uL   Neutrophils 72 Not Estab. %   Lymphs 13 Not Estab. %   Monocytes 7 Not Estab. %   Eos 7 Not Estab. %   Basos 1 Not Estab. %   Neutrophils Absolute 6.8 1.4 - 7.0 x10E3/uL   Lymphocytes Absolute 1.3 0.7 - 3.1 x10E3/uL   Monocytes Absolute 0.7 0.1 - 0.9 x10E3/uL   EOS (ABSOLUTE) 0.7 (H) 0.0 - 0.4 x10E3/uL   Basophils Absolute 0.1 0.0 - 0.2 x10E3/uL   Immature Granulocytes 0 Not Estab. %   Immature Grans (Abs) 0.0 0.0 - 0.1 x10E3/uL  CMP14+EGFR     Status: Abnormal   Collection Time: 04/13/24 10:35 AM  Result Value Ref Range   Glucose 116 (H) 70 - 99 mg/dL   BUN 18 8 - 27 mg/dL   Creatinine, Ser 9.14 0.76 - 1.27 mg/dL   eGFR 68 >78 GN/FAO/1.30   BUN/Creatinine Ratio 16 10 - 24   Sodium 141 134 - 144 mmol/L   Potassium 4.2 3.5 - 5.2 mmol/L   Chloride 102 96 - 106 mmol/L   CO2 20 20 - 29 mmol/L   Calcium  12.0 (H) 8.6 - 10.2 mg/dL   Total Protein 7.2 6.0 - 8.5 g/dL   Albumin 4.6 3.8 - 4.8 g/dL   Globulin, Total 2.6 1.5 - 4.5 g/dL   Bilirubin Total 0.5 0.0 - 1.2 mg/dL   Alkaline Phosphatase 72 44 - 121 IU/L   AST 15 0 - 40 IU/L   ALT 12 0 - 44 IU/L  Lipid Panel w/o Chol/HDL Ratio     Status: None   Collection Time: 04/13/24 10:35 AM  Result Value Ref Range   Cholesterol, Total 131 100 - 199 mg/dL   Triglycerides 95 0 - 149 mg/dL   HDL 41 >86 mg/dL   VLDL Cholesterol Cal 18 5 - 40 mg/dL   LDL Chol Calc (NIH) 72 0 - 99 mg/dL  Hemoglobin V7Q     Status: Abnormal   Collection Time: 04/13/24 10:35 AM  Result Value Ref  Range   Hgb A1c MFr Bld 6.7 (H) 4.8 - 5.6 %    Comment:          Prediabetes: 5.7 - 6.4          Diabetes: >6.4          Glycemic control for adults with diabetes: <7.0  Est. average glucose Bld gHb Est-mCnc 146 mg/dL  CBC with Differential     Status: Abnormal   Collection Time: 04/25/24 11:54 AM  Result Value Ref Range   WBC 7.4 4.0 - 10.5 K/uL   RBC 6.11 (H) 4.22 - 5.81 MIL/uL   Hemoglobin 15.5 13.0 - 17.0 g/dL   HCT 30.8 65.7 - 84.6 %   MCV 78.6 (L) 80.0 - 100.0 fL   MCH 25.4 (L) 26.0 - 34.0 pg   MCHC 32.3 30.0 - 36.0 g/dL   RDW 96.2 (H) 95.2 - 84.1 %   Platelets 112 (L) 150 - 400 K/uL   nRBC 0.0 0.0 - 0.2 %   Neutrophils Relative % 86 %   Neutro Abs 6.3 1.7 - 7.7 K/uL   Lymphocytes Relative 4 %   Lymphs Abs 0.3 (L) 0.7 - 4.0 K/uL   Monocytes Relative 6 %   Monocytes Absolute 0.5 0.1 - 1.0 K/uL   Eosinophils Relative 3 %   Eosinophils Absolute 0.2 0.0 - 0.5 K/uL   Basophils Relative 1 %   Basophils Absolute 0.0 0.0 - 0.1 K/uL   Immature Granulocytes 0 %   Abs Immature Granulocytes 0.03 0.00 - 0.07 K/uL    Comment: Performed at [facility], [address] [place], [place] [address]  Comprehensive metabolic panel     Status: Abnormal   Collection Time: 04/25/24 11:54 AM  Result Value Ref Range   Sodium 132 (L) 135 - 145 mmol/L   Potassium 4.0 3.5 - 5.1 mmol/L   Chloride 98 98 - 111 mmol/L   CO2 22 22 - 32 mmol/L   Glucose, Bld 179 (H) 70 - 99 mg/dL    Comment: Glucose reference range applies only to samples taken after fasting for at least 8 hours.   BUN 25 (H) 8 - 23 mg/dL   Creatinine, Ser 1.02 0.61 - 1.24 mg/dL   Calcium  11.2 (H) 8.9 - 10.3 mg/dL   Total Protein 7.2 6.5 - 8.1 g/dL   Albumin 4.0 3.5 - 5.0 g/dL   AST 30 15 - 41 U/L   ALT 23 0 - 44 U/L   Alkaline Phosphatase 58 38 - 126 U/L   Total Bilirubin 1.3 (H) 0.0 - 1.2 mg/dL   GFR, Estimated >72 >53 mL/min    Comment: (NOTE) Calculated using the CKD-EPI Creatinine Equation (2021)    Anion  gap 12 5 - 15    Comment: Performed at [facility], [address] [place], [place] [address]  Brain natriuretic peptide     Status: None   Collection Time: 04/25/24 11:54 AM  Result Value Ref Range   B Natriuretic Peptide 36.4 0.0 - 100.0 pg/mL    Comment: Performed at [facility], [address] [place], [place] [address]  Resp panel by RT-PCR (RSV, Flu A&B, Covid) Anterior Nasal Swab     Status: None   Collection Time: 04/25/24  4:21 PM   Specimen: Anterior Nasal Swab  Result Value Ref Range   SARS Coronavirus 2 by RT PCR NEGATIVE NEGATIVE    Comment: (NOTE) SARS-CoV-2 target nucleic acids are NOT DETECTED.  The SARS-CoV-2 RNA is generally detectable in upper respiratory specimens during the acute phase of infection. The lowest concentration of SARS-CoV-2 viral copies this assay can detect is 138 copies/mL. A negative result does not preclude SARS-Cov-2 infection and should not be used as the sole basis for treatment or other patient management decisions. A negative result may occur with  improper specimen collection/handling, submission of specimen other than nasopharyngeal swab, presence of viral mutation(s) within the areas targeted by this assay, and inadequate number of viral copies(<138 copies/mL). A negative result must be combined with clinical observations, patient history, and epidemiological information. The expected result is Negative.  Fact Sheet for Patients:  BloggerCourse.com  Fact Sheet for Healthcare Providers:  SeriousBroker.it  This test is no t yet approved or cleared by the [place]  FDA and  has been authorized for detection and/or diagnosis of SARS-CoV-2 by FDA under an Emergency Use Authorization (EUA). This EUA will remain  in effect (meaning this test can be used) for the duration of the COVID-19 declaration under Section 564(b)(1) of the Act, 21 U.S.C.section  360bbb-3(b)(1), unless the authorization is terminated  or revoked sooner.       Influenza A by PCR NEGATIVE NEGATIVE   Influenza B by PCR NEGATIVE NEGATIVE    Comment: (NOTE) The Xpert Xpress SARS-CoV-2/FLU/RSV plus assay is intended as an aid in the diagnosis of influenza from Nasopharyngeal swab specimens and should not be used as a sole basis for treatment. Nasal washings and aspirates are unacceptable for Xpert Xpress SARS-CoV-2/FLU/RSV testing.  Fact Sheet for Patients: BloggerCourse.com  Fact Sheet for Healthcare Providers: SeriousBroker.it  This test is not yet approved or cleared by the [place]  FDA and has been authorized for detection and/or diagnosis of SARS-CoV-2 by FDA under an Emergency Use Authorization (EUA). This EUA will remain in effect (meaning this test can be used) for the duration of the COVID-19 declaration under Section 564(b)(1) of the Act, 21 U.S.C. section 360bbb-3(b)(1), unless the authorization is terminated or revoked.     Resp Syncytial Virus by PCR NEGATIVE NEGATIVE    Comment: (NOTE) Fact Sheet for Patients: BloggerCourse.com  Fact Sheet for Healthcare Providers: SeriousBroker.it  This test is not yet approved or cleared by the [place]  FDA and has been authorized for detection and/or diagnosis of SARS-CoV-2 by FDA under an Emergency Use Authorization (EUA). This EUA will remain in effect (meaning this test can be used) for the duration of the COVID-19 declaration under Section 564(b)(1) of the Act, 21 U.S.C. section 360bbb-3(b)(1), unless the authorization is terminated or revoked.  Performed at [facility], [address] [place], [place] [address]   Urinalysis, w/ Reflex to Culture (Infection Suspected) -Urine, Clean Catch     Status: Abnormal   Collection Time: 04/25/24  4:21 PM  Result Value Ref Range    Specimen Source URINE, CLEAN CATCH    Color, Urine YELLOW (A) YELLOW   APPearance CLEAR (A) CLEAR   Specific Gravity, Urine 1.044 (H) 1.005 - 1.030   pH 7.0 5.0 - 8.0   Glucose, UA >=500 (A) NEGATIVE mg/dL   Hgb urine dipstick SMALL (A) NEGATIVE   Bilirubin Urine NEGATIVE NEGATIVE   Ketones, ur NEGATIVE NEGATIVE mg/dL   Protein, ur NEGATIVE NEGATIVE mg/dL   Nitrite NEGATIVE NEGATIVE   Leukocytes,Ua NEGATIVE NEGATIVE   RBC / HPF 0-5 0 - 5 RBC/hpf   WBC, UA 0-5 0 - 5 WBC/hpf    Comment:        Reflex urine culture not performed if WBC <=10, OR if Squamous epithelial cells >5. If Squamous epithelial cells >5 suggest recollection.    Bacteria, UA NONE SEEN NONE SEEN   Squamous Epithelial / HPF 0-5 0 - 5 /HPF    Comment: Performed at [facility] [address] [place], [place] [address]  CBG monitoring, ED  Status: None   Collection Time: 04/25/24 10:28 PM  Result Value Ref Range   Glucose-Capillary 83 70 - 99 mg/dL    Comment: Glucose reference range applies only to samples taken after fasting for at least 8 hours.  Basic metabolic panel     Status: Abnormal   Collection Time: 04/26/24  5:48 AM  Result Value Ref Range   Sodium 133 (L) 135 - 145 mmol/L   Potassium 3.5 3.5 - 5.1 mmol/L   Chloride 100 98 - 111 mmol/L   CO2 21 (L) 22 - 32 mmol/L   Glucose, Bld 91 70 - 99 mg/dL    Comment: Glucose reference range applies only to samples taken after fasting for at least 8 hours.   BUN 25 (H) 8 - 23 mg/dL   Creatinine, Ser 1.61 0.61 - 1.24 mg/dL   Calcium  10.2 8.9 - 10.3 mg/dL   GFR, Estimated >09 >60 mL/min    Comment: (NOTE) Calculated using the CKD-EPI Creatinine Equation (2021)    Anion gap 12 5 - 15    Comment: Performed at [facility], [address] [place], [place] [address]  CBC     Status: Abnormal   Collection Time: 04/26/24  5:48 AM  Result Value Ref Range   WBC 10.9 (H) 4.0 - 10.5 K/uL   RBC 5.69 4.22 - 5.81 MIL/uL   Hemoglobin 14.4  13.0 - 17.0 g/dL   HCT 81.1 91.4 - 78.2 %   MCV 77.3 (L) 80.0 - 100.0 fL   MCH 25.3 (L) 26.0 - 34.0 pg   MCHC 32.7 30.0 - 36.0 g/dL   RDW 95.6 (H) 21.3 - 08.6 %   Platelets 112 (L) 150 - 400 K/uL   nRBC 0.0 0.0 - 0.2 %    Comment: Performed at [facility], [address] [place], [place] [address]  CBG monitoring, ED     Status: Abnormal   Collection Time: 04/26/24  8:26 AM  Result Value Ref Range   Glucose-Capillary 102 (H) 70 - 99 mg/dL    Comment: Glucose reference range applies only to samples taken after fasting for at least 8 hours.  Glucose, capillary     Status: Abnormal   Collection Time: 04/26/24 12:24 PM  Result Value Ref Range   Glucose-Capillary 129 (H) 70 - 99 mg/dL    Comment: Glucose reference range applies only to samples taken after fasting for at least 8 hours.  Glucose, capillary     Status: Abnormal   Collection Time: 04/26/24  5:35 PM  Result Value Ref Range   Glucose-Capillary 157 (H) 70 - 99 mg/dL    Comment: Glucose reference range applies only to samples taken after fasting for at least 8 hours.  Glucose, capillary     Status: Abnormal   Collection Time: 04/26/24  9:37 PM  Result Value Ref Range   Glucose-Capillary 150 (H) 70 - 99 mg/dL    Comment: Glucose reference range applies only to samples taken after fasting for at least 8 hours.  Basic metabolic panel with GFR     Status: Abnormal   Collection Time: 04/27/24  4:08 AM  Result Value Ref Range   Sodium 136 135 - 145 mmol/L   Potassium 3.6 3.5 - 5.1 mmol/L   Chloride 104 98 - 111 mmol/L   CO2 27 22 - 32 mmol/L   Glucose, Bld 89 70 - 99 mg/dL    Comment: Glucose reference range applies only to samples taken after fasting for  at least 8 hours.   BUN 35 (H) 8 - 23 mg/dL   Creatinine, Ser 4.40 0.61 - 1.24 mg/dL   Calcium  9.7 8.9 - 10.3 mg/dL   GFR, Estimated >10 >27 mL/min    Comment: (NOTE) Calculated using the CKD-EPI Creatinine Equation (2021)    Anion gap 5 5 - 15    Comment:  Performed at [facility], [address] [place], [place] [address]  CBC     Status: Abnormal   Collection Time: 04/27/24  4:08 AM  Result Value Ref Range   WBC 8.2 4.0 - 10.5 K/uL   RBC 5.41 4.22 - 5.81 MIL/uL   Hemoglobin 13.8 13.0 - 17.0 g/dL   HCT 44.0 34.7 - 42.5 %   MCV 78.6 (L) 80.0 - 100.0 fL   MCH 25.5 (L) 26.0 - 34.0 pg   MCHC 32.5 30.0 - 36.0 g/dL   RDW 95.6 (H) 38.7 - 56.4 %   Platelets 113 (L) 150 - 400 K/uL   nRBC 0.0 0.0 - 0.2 %    Comment: Performed at [facility], [address] [place], [place] [address]  Magnesium     Status: None   Collection Time: 04/27/24  4:08 AM  Result Value Ref Range   Magnesium 2.3 1.7 - 2.4 mg/dL    Comment: Performed at [facility], [address] [place], [place] [address]  Glucose, capillary     Status: Abnormal   Collection Time: 04/27/24  7:38 AM  Result Value Ref Range   Glucose-Capillary 105 (H) 70 - 99 mg/dL    Comment: Glucose reference range applies only to samples taken after fasting for at least 8 hours.  Glucose, capillary     Status: Abnormal   Collection Time: 04/27/24 11:21 AM  Result Value Ref Range   Glucose-Capillary 141 (H) 70 - 99 mg/dL    Comment: Glucose reference range applies only to samples taken after fasting for at least 8 hours.  POCT CBG (Fasting - Glucose)     Status: Abnormal   Collection Time: 05/12/24  3:00 PM  Result Value Ref Range   Glucose Fasting, POC 162 (A) 70 - 99 mg/dL  POCT CBG (Fasting - Glucose)     Status: Abnormal   Collection Time: 06/05/24 11:14 AM  Result Value Ref Range   Glucose Fasting, POC 137 (A) 70 - 99 mg/dL      Assessment & Plan:  Continue his current medications, resume Jardiance  and Flomax .  Monitor blood pressure at home. Problem List Items Addressed This Visit     Type 2 diabetes mellitus with hyperglycemia, without long-term current use of insulin  (HCC)   Relevant Orders   POCT CBG (Fasting - Glucose) (Completed)   Coronary  artery disease involving native coronary artery of native heart without angina pectoris   Hypertension associated with diabetes (HCC) - Primary   Combined hyperlipidemia associated with type 2 diabetes mellitus (HCC)    Return in about 6 weeks (around 07/17/2024).   Total time spent: 25 minutes  Aisha Hove, MD  06/05/2024   This document may have been prepared by [facility] Voice Recognition software and as such may include unintentional dictation errors.

## 2024-06-06 ENCOUNTER — Ambulatory Visit: Admitting: Urology

## 2024-06-06 VITALS — BP 148/78 | HR 69 | Ht 72.0 in | Wt 214.1 lb

## 2024-06-06 DIAGNOSIS — D494 Neoplasm of unspecified behavior of bladder: Secondary | ICD-10-CM

## 2024-06-06 NOTE — Progress Notes (Signed)
 06/06/24 12:46 PM   [name] May 23, 1946 [id number]  CC: History of bladder and kidney cancer, new bladder lesion on CT  HPI: Comorbid and frail 78 year old male here with his wife today who provides most of the history.  He reportedly has a history of what sounds like urothelial cell carcinoma of the right kidney and underwent nephroureterectomy with Dr. Aram Knights around 2006, he also had a bladder lesion at that time that required TURBT.  Those records are not available to me, and pathology not available.  He apparently had BCG at that time as well.  He is referred after recent hospitalization for fall where CT scan showed a 1.5 cm possible right lateral wall bladder lesion.  He has had some intermittent pink urine over the last few months but he thinks this has been when he passes a stone.  Most recent urinalysis from April was benign.  No evidence of stones on the most recent CT scan.  He is on Flomax  at baseline for mild obstructive urinary symptoms.   PMH: Past Medical History:  Diagnosis Date   Back pain    Bladder cancer (HCC)    COVID-19 09/10/2020   Received Casirivimab/Irdevimab infusion on 09/11/20   Diabetes mellitus without complication (HCC)    HOH (hard of hearing)    Hypertension    Obesity    Renal cancer (HCC)    Renal cell cancer (HCC)    Throat cancer [facility])    Tremor     Surgical History: Past Surgical History:  Procedure Laterality Date   APPENDECTOMY     BLADDER SURGERY     CAROTID PTA/STENT INTERVENTION Right 08/26/2021   Procedure: CAROTID PTA/STENT INTERVENTION;  Surgeon: Jackquelyn Mass, MD;  Location: [name] INVASIVE CV LAB;  Service: Cardiovascular;  Laterality: Right;   CATARACT EXTRACTION W/PHACO Right 01/21/2023   Procedure: CATARACT EXTRACTION PHACO AND INTRAOCULAR LENS PLACEMENT (IOC) RIGHT DIABETIC  4.89  00:45.6;  Surgeon: Trudi Fus, MD;  Location: [facility];  Service: Ophthalmology;  Laterality: Right;  Diabetic    CERVICAL SPINE SURGERY     CHOLECYSTECTOMY N/A 03/23/2018   Procedure: LAPAROSCOPIC CHOLECYSTECTOMY;  Surgeon: Eldred Grego, MD;  Location: [name] ORS;  Service: General;  Laterality: N/A;   COLONOSCOPY     COLONOSCOPY WITH PROPOFOL  N/A 11/10/2017   Procedure: COLONOSCOPY WITH PROPOFOL ;  Surgeon: Toledo, Alphonsus Jeans, MD;  Location: [name] ENDOSCOPY;  Service: Gastroenterology;  Laterality: N/A;   ESOPHAGOGASTRODUODENOSCOPY (EGD) WITH PROPOFOL  N/A 11/10/2017   Procedure: ESOPHAGOGASTRODUODENOSCOPY (EGD) WITH PROPOFOL ;  Surgeon: Toledo, Alphonsus Jeans, MD;  Location: [name] ENDOSCOPY;  Service: Gastroenterology;  Laterality: N/A;   HERNIA REPAIR     left od surgery     MICROLARYNGOSCOPY N/A 11/05/2020   Procedure: MICROLARYNGOSCOPY WITH BIOPSIES;  Surgeon: Von Grumbling, MD;  Location: [facility];  Service: ENT;  Laterality: N/A;   NEPHRECTOMY     right kidney nephrectomy   rt elbow surgery     THROAT SURGERY  1990   for cancer removal at [facility]   Family History: Family History  Problem Relation Age of Onset   Diabetes Brother    Hypertension Brother    CAD Brother    CAD Brother     Social History:  reports that he quit smoking about 34 years ago. His smoking use included cigarettes. He has never used smokeless tobacco. He reports that he does not currently use alcohol. He reports that he does not use drugs.  Physical Exam: BP Aaron Aas)  148/78   Pulse 69   Ht 6' (1.829 m)   Wt 214 lb 2 oz (97.1 kg)   BMI 29.04 kg/m    Constitutional:  Alert and oriented, No acute distress. Cardiovascular: No clubbing, cyanosis, or edema. Respiratory: Normal respiratory effort, no increased work of breathing. GI: Abdomen is soft, nontender, nondistended, no abdominal masses   Laboratory Data: Reviewed, see HPI  Pertinent Imaging: I have personally viewed and interpreted the CT scan showing a possible 1.5 cm right lateral wall bladder lesion, solitary left kidney, no evidence of  stones.  Assessment & Plan:   78 year old male with history of reported right nephroureterectomy with Dr. Aram Knights around 2006 for likely urothelial cell carcinoma with a bladder lesion as well that required TURBT and BCG, unfortunately those records are not available to me, and so pathology and treatment course is unclear.  Most recent CT shows a possible new 1.5 cm bladder lesion that could represent bladder tumor.  We discussed need for cystoscopy for further evaluation and possible TURBT if that showed a bladder tumor.  Cystoscopy for further evaluation of possible bladder lesion on CT  I spent 65 total minutes on the day of the encounter including pre-visit review of the medical record, face-to-face time with the patient, and post visit ordering of labs/imaging/tests.  Extensive review of prior medical records, imaging, obtaining history from wife regarding history of prior nephrectomy.   Jay Meth, MD 06/06/2024  [facility] [address], [address] [place], [place] [address] [phone]

## 2024-06-06 NOTE — Patient Instructions (Signed)

## 2024-06-28 ENCOUNTER — Telehealth: Payer: Self-pay

## 2024-06-28 ENCOUNTER — Other Ambulatory Visit: Payer: Self-pay

## 2024-06-28 ENCOUNTER — Ambulatory Visit: Admitting: Urology

## 2024-06-28 ENCOUNTER — Other Ambulatory Visit
Admission: RE | Admit: 2024-06-28 | Discharge: 2024-06-28 | Disposition: A | Discharge status: Home / Self Care | Attending: Urology | Admitting: Urology

## 2024-06-28 VITALS — BP 135/72 | HR 72

## 2024-06-28 DIAGNOSIS — R319 Hematuria, unspecified: Secondary | ICD-10-CM | POA: Diagnosis not present

## 2024-06-28 DIAGNOSIS — D494 Neoplasm of unspecified behavior of bladder: Secondary | ICD-10-CM

## 2024-06-28 LAB — URINALYSIS, COMPLETE (UACMP) WITH MICROSCOPIC
Bacteria, UA: NONE SEEN
Bilirubin Urine: NEGATIVE
Glucose, UA: >=500 mg/dL — AB
Ketones, ur: NEGATIVE mg/dL
Leukocytes,Ua: NEGATIVE
Nitrite: NEGATIVE
Protein, ur: NEGATIVE mg/dL
RBC / HPF: >50 RBC/hpf (ref 0–5)
Specific Gravity, Urine: 1.02 (ref 1.005–1.030)
pH: 7 (ref 5.0–8.0)

## 2024-06-28 MED ORDER — LIDOCAINE HCL URETHRAL/MUCOSAL 2 % EX GEL
1.0000 | Freq: Once | CUTANEOUS | Status: AC
  Administered 2024-06-28: 1 via URETHRAL

## 2024-06-28 MED ORDER — CEPHALEXIN 250 MG PO CAPS
500.0000 mg | ORAL_CAPSULE | Freq: Once | ORAL | Status: AC
  Administered 2024-06-28: 500 mg via ORAL

## 2024-06-28 NOTE — Progress Notes (Signed)
  Phone Number: [phone] for Surgical Coordinator Fax Number: [phone]  REQUEST FOR SURGICAL CLEARANCE     Date: Date: 06/28/24  Faxed to: Dr. Fernand, MD  Surgeon: Dr. [name], MD     Date of Surgery: 07/21/2024  Operation: Cystoscopy with Bladder Biopsy with Bilateral Retrograde Pyelogram and Intravesical Instillation of Gemcitabine   Anesthesia Type: General   Diagnosis: Bladder Tumor  Patient Requires:   Cardiac / Vascular Clearance : Yes   Risk Assessment:    Low   []       Moderate   []     High   []           This patient is optimized for surgery  YES []       NO   []    I recommend further assessment/workup prior to surgery. YES []      NO  []   Appointment scheduled for: _______________________   Further recommendations: ____________________________________     Physician Signature:__________________________________   Printed Name: ________________________________________   Date: _________________

## 2024-06-28 NOTE — Patient Instructions (Signed)
 Transurethral Resection of Bladder Tumor  Transurethral resection of a bladder tumor is the removal (resection) of cancerous tissue (tumor) from the inside wall of the bladder. The bladder is the organ that holds urine. The tumor is removed through the tube that carries urine out of the body (urethra). In a transurethral resection, a thin telescope with a light, a tiny camera, and an electric cutting edge (resectoscope) is passed through the urethra. In men, the opening of the urethra is at the end of the penis. In women, it is just above the opening of the vagina. Tell a health care provider about: Any allergies you have. All medicines you are taking, including vitamins, herbs, eye drops, creams, and over-the-counter medicines. Any problems you or family members have had with anesthetic medicines. Any bleeding problems you have. Any surgeries you have had. Any medical conditions you have, including recent urinary tract infections. Whether you are pregnant or may be pregnant. What are the risks? Generally, this is a safe procedure. However, problems may occur, including: Infection. Bleeding. Allergic reactions to medicines. Damage to nearby structures or organs. Difficulty urinating from blockage of the urethra or not being able to urinate (urinary retention). Deep vein thrombosis. This is a blood clot that can develop in your leg. Recurring cancer. What happens before the procedure? When to stop eating and drinking Follow instructions from your health care provider about what you may eat and drink before your procedure. These may include: 8 hours before your procedure Stop eating most foods. Do not eat meat, fried foods, or fatty foods. Eat only light foods, such as toast or crackers. All liquids are okay except energy drinks and alcohol. 6 hours before your procedure Stop eating. Drink only clear liquids, such as water, clear fruit juice, black coffee, plain tea, and sports  drinks. Do not drink energy drinks or alcohol. 2 hours before your procedure Stop drinking all liquids. You may be allowed to take medicines with small sips of water. Medicines Ask your health care provider about: Changing or stopping your regular medicines. This is especially important if you are taking diabetes medicines or blood thinners. Taking medicines such as aspirin and ibuprofen. These medicines can thin your blood. Do not take these medicines unless your health care provider tells you to take them. Taking over-the-counter medicines, vitamins, herbs, and supplements. General instructions If you will be going home right after the procedure, plan to have a responsible adult: Take you home from the hospital or clinic. You will not be allowed to drive. Care for you for the time you are told. Ask your health care provider what steps will be taken to help prevent infection. These steps may include: Washing skin with a germ-killing soap. Taking antibiotic medicine. Do not use any products that contain nicotine or tobacco for at least 4 weeks before the procedure. These products include cigarettes, chewing tobacco, and vaping devices, such as e-cigarettes. If you need help quitting, ask your health care provider. What happens during the procedure? An IV will be inserted into one of your veins. You will be given one or more of the following: A medicine to help you relax (sedative). A medicine that is injected into your spine to numb the area below and slightly above the injection site (spinal anesthetic). A medicine that is injected into an area of your body to numb everything below the injection site (regional anesthetic). A medicine to make you fall asleep (general anesthetic). Your legs will be placed in foot rests (  stirrups) to open your legs and bend your knees. The resectoscope will be passed through your urethra and into your bladder. The part of your bladder with the tumor will be  resected by the cutting edge of the resectoscope. Fluid will be passed to rinse out the cut tissues (irrigation). The resectoscope will then be taken out. A small, thin tube (catheter) will be passed through your urethra and into your bladder. The catheter will drain urine into a bag outside of your body. The procedure may vary among health care providers and hospitals. What happens after the procedure? Your blood pressure, heart rate, breathing rate, and blood oxygen level will be monitored until you leave the hospital or clinic. You may continue to receive fluids and medicines through an IV. You will be given pain medicine to relieve pain. You will have a catheter to drain your urine. The amount of urine will be measured. If you have blood in your urine, your bladder may be rinsed out by passing fluid through your catheter. You will be encouraged to walk as soon as you can. You may have to wear compression stockings. These stockings help to prevent blood clots and reduce swelling in your legs. If you were given a sedative during the procedure, it can affect you for several hours. Do not drive or operate machinery until your health care provider says that it is safe. Summary Transurethral resection of a bladder tumor is the removal (resection) of a cancerous growth (tumor) on the inside wall of the bladder. To do this procedure, your health care provider uses a thin telescope with a light, a tiny camera, and an electric cutting edge (resectoscope) that is guided to your bladder through your urethra. The part of your bladder that is affected by the tumor will be resected by the cutting edge of the resectoscope. A catheter will be passed through your urethra and into your bladder. The catheter will drain urine into a bag outside of your body. If you will be going home right after the procedure, plan to have a responsible adult take you home from the hospital or clinic. You will not be allowed to  drive. This information is not intended to replace advice given to you by your health care provider. Make sure you discuss any questions you have with your health care provider. Document Revised: 12/19/2021 Document Reviewed: 12/19/2021 Elsevier Patient Education  2024 [employer].

## 2024-06-28 NOTE — Progress Notes (Signed)
 Cystoscopy Procedure Note:  Indication: Possible bladder tumor  After informed consent and discussion of the procedure and its risks, [name] was positioned and prepped in the standard fashion. Cystoscopy was performed with a flexible cystoscope. The urethra, bladder neck and entire bladder was visualized in a standard fashion. The prostate was moderate in size. The ureteral orifices were visualized in their normal location and orientation. ~2cm papillary bladder tumor right posterolateral wall, no abnormalities on retroflexion.  Imaging: CT reviewed, 2cm right lateral wall bladder tumor  Findings: 2cm papillary bladder tumor  -----------------------------------------------------------------  Assessment and Plan: Comorbid 78 year old male with history of right upper tract urothelial cell carcinoma treated with nephroureterectomy by Dr. Ike, now with new 2 cm papillary bladder recurrence.  We discussed transurethral resection of bladder tumor (TURBT) and risks and benefits at length. This is typically a 1 to 2-hour procedure done under general anesthesia in the operating room.  A scope is inserted through the urethra and used to resect abnormal tissue within the bladder, which is then sent to the pathologist to determine grade and stage of the tumor.  Risks include bleeding, infection, need for temporary Foley placement, and bladder perforation.  Treatment strategies are based on the type of tumor and depth of invasion.  We briefly reviewed the different treatment pathways for non-muscle invasive and muscle invasive bladder cancer.  Schedule cystoscopy, left retrograde pyelogram, bladder biopsy and fulguration, gemcitabine Okay to continue baby aspirin  through surgery  [name], MD 06/28/2024

## 2024-06-28 NOTE — Progress Notes (Signed)
   [facility] Surgical Posting Form  Surgery Date: Date: 07/21/2024  Surgeon: Dr. [name], MD  Inpt ( No  )   Outpt (Yes)   Obs ( No  )   Diagnosis: D49.4 Bladder Tumor  -CPT: 47795, 51720, [id number]  Surgery: Cystoscopy with Bladder Biopsy with Bilateral Retrograde Pyelogram and Intravesical Instillation of Gemcitabine  Stop Anticoagulations: No, may continue ASA   Cardiac/Medical/Pulmonary Clearance needed: yes  Clearance needed from Dr: Fernand  Clearance request sent on: Date: 06/28/24   *Orders entered into EPIC  Date: 06/28/24   *Case booked in EPIC  Date: 06/28/24  *Notified pt of Surgery: Date: 06/28/24  PRE-OP UA & CX: Yes, obtained in clinic today 06/28/2024  *Placed into Prior Authorization Work [place] Date: 06/28/24  Assistant/laser/rep:No

## 2024-06-28 NOTE — Telephone Encounter (Signed)
 Per Dr. Francisca, Patient is to be scheduled for Cystoscopy with Bladder Biopsy with Bilateral Retrograde Pyelogram and Intravesical Instillation of Gemcitabine   [name] and his wife were contacted and possible surgical dates were discussed, Friday July 25th, 2025 was agreed upon for surgery.   Patient was directed to call [phone] between 1-3pm the day before surgery to find out surgical arrival time.  Instructions were given not to eat or drink from midnight on the night before surgery and have a driver for the day of surgery. On the surgery day patient was instructed to enter through the Medical Mall entrance of [facility] report the Same Day Surgery desk.   Pre-Admit Testing will be in contact via phone to set up an interview with the anesthesia team to review your history and medications prior to surgery.   Reminder of this information was sent via Mail to the patient.

## 2024-06-28 NOTE — Progress Notes (Signed)
 Surgical Physician Order Form [facility]  Dr. [name], MD  * Scheduling expectation : Next Available  *Length of Case: 30 minutes  *Clearance needed: no  *Anticoagulation Instructions: Okay to continue baby aspirin   *Aspirin  Instructions: Ok to continue Aspirin   *Post-op visit Date/Instructions:  1-2 week with pathology review  *Diagnosis: Bladder Tumor  *Procedure:  Cysto Bladder Biopsy (47795), gemcitabine, left retrograde pyelogram   Additional orders: Gemcitabine 2000mg  bladder instillation  -Admit type: OUTpatient  -Anesthesia: General  -VTE Prophylaxis Standing Order SCD's       Other:   -Standing Lab Orders Per Anesthesia    Lab other: UA&Urine Culture  -Standing Test orders EKG/Chest x-ray per Anesthesia       Test other:   - Medications:  Ancef  2gm IV  -Other orders:  N/A

## 2024-06-29 LAB — URINE CULTURE: Culture: NO GROWTH

## 2024-07-01 ENCOUNTER — Other Ambulatory Visit: Payer: Self-pay | Admitting: Internal Medicine

## 2024-07-01 DIAGNOSIS — E1165 Type 2 diabetes mellitus with hyperglycemia: Secondary | ICD-10-CM

## 2024-07-05 ENCOUNTER — Other Ambulatory Visit: Payer: Self-pay | Admitting: Family Medicine

## 2024-07-05 DIAGNOSIS — M4802 Spinal stenosis, cervical region: Secondary | ICD-10-CM | POA: Diagnosis not present

## 2024-07-05 DIAGNOSIS — M5416 Radiculopathy, lumbar region: Secondary | ICD-10-CM | POA: Diagnosis not present

## 2024-07-05 DIAGNOSIS — M5412 Radiculopathy, cervical region: Secondary | ICD-10-CM

## 2024-07-10 DIAGNOSIS — L578 Other skin changes due to chronic exposure to nonionizing radiation: Secondary | ICD-10-CM | POA: Diagnosis not present

## 2024-07-10 DIAGNOSIS — C44319 Basal cell carcinoma of skin of other parts of face: Secondary | ICD-10-CM | POA: Diagnosis not present

## 2024-07-10 DIAGNOSIS — L814 Other melanin hyperpigmentation: Secondary | ICD-10-CM | POA: Diagnosis not present

## 2024-07-13 ENCOUNTER — Other Ambulatory Visit: Payer: Self-pay

## 2024-07-13 ENCOUNTER — Encounter
Admission: RE | Admit: 2024-07-13 | Discharge: 2024-07-13 | Disposition: A | Discharge status: Home / Self Care | Source: Ambulatory Visit | Attending: Urology | Admitting: Urology

## 2024-07-13 DIAGNOSIS — E119 Type 2 diabetes mellitus without complications: Secondary | ICD-10-CM

## 2024-07-13 HISTORY — DX: Polyneuropathy, unspecified: G62.9

## 2024-07-13 HISTORY — DX: Atherosclerotic heart disease of native coronary artery without angina pectoris: I25.10

## 2024-07-13 HISTORY — DX: Occlusion and stenosis of bilateral carotid arteries: I65.23

## 2024-07-13 HISTORY — DX: Other chronic pain: G89.29

## 2024-07-13 HISTORY — DX: Heart failure, unspecified: I50.9

## 2024-07-13 HISTORY — DX: Unspecified osteoarthritis, unspecified site: M19.90

## 2024-07-13 HISTORY — DX: Lumbago with sciatica, unspecified side: M54.40

## 2024-07-13 HISTORY — DX: Personal history of urinary calculi: Z87.442

## 2024-07-13 NOTE — Patient Instructions (Addendum)
 Your procedure is scheduled on: 07/21/24 - Friday Report to the Registration Desk on the 1st floor of the Medical Mall. To find out your arrival time, please call [phone] between 1PM - 3PM on: Thursday If your arrival time is 6:00 am, do not arrive before that time as the Medical Mall entrance doors do not open until 6:00 am.  REMEMBER: Instructions that are not followed completely may result in serious medical risk, up to and including death; or upon the discretion of your surgeon and anesthesiologist your surgery may need to be rescheduled.  Do not eat food or drink any liquids after midnight the night before surgery.  No gum chewing or hard candies.  One week prior to surgery: Stop Anti-inflammatories (NSAIDS) such as Advil, Aleve, Ibuprofen, Motrin, Naproxen, Naprosyn and Aspirin  based products such as Excedrin, Goody's Powder, BC Powder. You may continue to take Tylenol  if needed for pain up until the day of surgery.  Stop ANY OVER THE COUNTER supplements until after surgery : VITAMIN C ,Cholecalciferol .  HOLD empagliflozin  (JARDIANCE ) beginning 07/18/24, may resume after surgery.  HOLD insulin  NPH-regular Human (NOVOLIN 70/30) on the morning of surgery, may resume with meals.  ON THE DAY OF SURGERY ONLY TAKE THESE MEDICATIONS WITH SIPS OF WATER:  carvedilol  (COREG )    No Alcohol for 24 hours before or after surgery.  No Smoking including e-cigarettes for 24 hours before surgery.  No chewable tobacco products for at least 6 hours before surgery.  No nicotine patches on the day of surgery.  Do not use any recreational drugs for at least a week (preferably 2 weeks) before your surgery.  Please be advised that the combination of cocaine and anesthesia may have negative outcomes, up to and including death. If you test positive for cocaine, your surgery will be cancelled.  On the morning of surgery brush your teeth with toothpaste and water, you may rinse your mouth with  mouthwash if you wish. Do not swallow any toothpaste or mouthwash.  Do not wear jewelry, make-up, hairpins, clips or nail polish.  For welded (permanent) jewelry: bracelets, anklets, waist bands, etc.  Please have this removed prior to surgery.  If it is not removed, there is a chance that hospital personnel will need to cut it off on the day of surgery.  Do not wear lotions, powders, or perfumes.   Do not shave body hair from the neck down 48 hours before surgery.  Contact lenses, hearing aids and dentures may not be worn into surgery.  Do not bring valuables to the hospital. [facility] is not responsible for any missing/lost belongings or valuables.   Notify your doctor if there is any change in your medical condition (cold, fever, infection).  Wear comfortable clothing (specific to your surgery type) to the hospital.  After surgery, you can help prevent lung complications by doing breathing exercises.  Take deep breaths and cough every 1-2 hours. Your doctor may order a device called an Incentive Spirometer to help you take deep breaths.  When coughing or sneezing, hold a pillow firmly against your incision with both hands. This is called "splinting." Doing this helps protect your incision. It also decreases belly discomfort.  If you are being admitted to the hospital overnight, leave your suitcase in the car. After surgery it may be brought to your room.  In case of increased patient census, it may be necessary for you, the patient, to continue your postoperative care in the Same Day Surgery department.  If you are being discharged the day of surgery, you will not be allowed to drive home. You will need a responsible individual to drive you home and stay with you for 24 hours after surgery.   If you are taking public transportation, you will need to have a responsible individual with you.  Please call the Pre-admissions Testing Dept. at [phone] if you have any questions  about these instructions.  Surgery Visitation Policy:  Patients having surgery or a procedure may have two visitors.  Children under the age of 22 must have an adult with them who is not the patient.  Inpatient Visitation:    Visiting hours are 7 a.m. to 8 p.m. Up to four visitors are allowed at one time in a patient room. The visitors may rotate out with other people during the day.  One visitor age 76 or older may stay with the patient overnight and must be in the room by 8 p.m.   Merchandiser, retail to address health-related social needs:  [email or web address]

## 2024-07-14 NOTE — Telephone Encounter (Signed)
 See comments

## 2024-07-17 DIAGNOSIS — C44319 Basal cell carcinoma of skin of other parts of face: Secondary | ICD-10-CM | POA: Diagnosis not present

## 2024-07-18 ENCOUNTER — Encounter: Payer: Self-pay | Admitting: Urology

## 2024-07-18 ENCOUNTER — Ambulatory Visit: Admitting: Internal Medicine

## 2024-07-18 NOTE — Progress Notes (Incomplete)
 Perioperative / Anesthesia Services  Pre-Admission Testing Clinical Review / Pre-Operative Anesthesia Consult  Date: 07/18/24  PATIENT DEMOGRAPHICS: Name: [name] DOB: [birth year] MRN:   [id number]  Note: Available PAT nursing documentation and vital signs have been reviewed. Clinical nursing staff has updated patient's PMH/PSHx, current medication list, and drug allergies/intolerances to ensure complete and comprehensive history available to assist care teams in MDM as it pertains to the aforementioned surgical procedure and anticipated anesthetic course. Extensive review of available clinical information personally performed. [facility] and [facility] updated with any diagnoses/procedures that  may have been inadvertently omitted during his intake with the pre-admission testing department's nursing staff.  PLANNED SURGICAL PROCEDURE(S):   Case: [id number] Date/Time: 07/21/24 1032   Procedures:      CYSTOSCOPY, WITH BIOPSY     CYSTOSCOPY, WITH RETROGRADE PYELOGRAM (Bilateral)     INSTILLATION, BLADDER - GEMCITABINE   Anesthesia type: General   Diagnosis: Bladder tumor [D49.4]   Pre-op diagnosis: Bladder Tumor   Location: [name] OR ROOM 10 / [name]S FOR ANEST[facility]IA GROUP   Surgeons: Francisca Redell BROCKS, MD        CLINICAL DISCUSSION: [name] is a 78 y.o. male who is submitted for pre-surgical anesthesia review and clearance prior to him undergoing the above procedure. Patient is a Former Smoker (quit 12/1989). Pertinent PMH includes: CAD, HFrEF, aortic atherosclerosis, BILATERAL carotid artery disease, chronic cerebral microvascular disease, bifascicular block (RBBB + LPFB), HTN, HLD, T2DM, pulmonary emphysema, large abdominal wall hernia (contains portions of the liver, colon, and small bowel), RCC (s/p RIGHT nephrectomy), throat cancer, bladder tumor, nephrolithiasis, BPH, chronic lower back pain with sciatica, OA, spinal spondylosis (cervical + thoracic + lumbar),  tremor.  Patient is followed by cardiology Orvil, MD). He was last seen in the cardiology clinic on 04/24/2024; notes reviewed. At the time of his clinic visit, patient doing well overall from a cardiovascular perspective. He complained of intermittent episodes of shortness of breath and dizziness that were generally self limiting. Patient denied any chest pain, PND, orthopnea, palpitations, significant peripheral edema, weakness, fatigue, symptoms, or presyncope/syncope. Patient with a past medical history significant for cardiovascular diagnoses. Documented physical exam was grossly benign, providing no evidence of acute exacerbation and/or decompensation of the patient's known cardiovascular conditions.  Coronary CTA performed on 06/17/2021 revealing an Agatston coronary calcium  score of 1947.1.  Severe diffuse LAD calcification with moderate disease in the midportion.  LCx also severely calcified with 40% mid disease.  RCA very small and nondominant.  Left dominant system.  Recommendations were for medical management of patient's coronary artery disease.   Patient with a history of BILATERAL carotid artery disease.  He underwent PTA on 08/26/2021, at which time a 10 x 8 x 40 Exact Stent was placed to the RIGHT internal carotid artery.  Most recent carotid duplex was performed on 09/11/2022 revealing a 1-39% stenosis in the RICA with a contralateral 40-59% stenosis in the LICA.  Vertebral arteries demonstrate antegrade flow.  There were normal flow hemodynamics within the subclavian arteries.  Most recent myocardial perfusion imaging study was performed on 04/20/2024 revealing a normal left ventricular systolic function with an EF of 64%.  There were no regional wall motion abnormalities.  No artifact or left ventricular cavity size enlargement appreciated on review of imaging. SPECT images demonstrated evidence of an inferoapical perfusion defect on stress imaging that was partially reversible at rest.   Study findings consistent with possible ischemia in the RCA territory.  Most recent TTE  performed on 04/25/2024 revealed a normal left ventricular systolic function with an EF of 55-60%. There was mild LVH.  There were no regional wall motion abnormalities. Left ventricular diastolic Doppler parameters consistent with abnormal relaxation (G1DD).  Right ventricular size and function normal with a TAPSE measuring 1.9 cm  (normal range >/= 1.6 cm).  Fibrocalcified aortic valve with no evidence of significant regurgitation was observed. There was trivial mitral valve regurgitation.  All transvalvular gradients were noted to be normal providing no evidence of hemodynamically significant valvular stenosis. Aorta normal in size with no evidence of ectasia or aneurysmal dilatation.  Blood pressure well controlled at 102/70 mmHg on currently prescribed beta-blocker (carvedilol ), diuretic (chlorthalidone ), and ACEi (lisinopril ) therapies.  Patient is on rosuvastatin  for his HLD diagnosis and ASCVD prevention. T2DM well controlled on currently prescribed regimen; last HgbA1c was 6.7% when checked on 04/13/2024. In the setting of known cardiovascular diagnoses and concurrent T2DM, patient is taking an SGLT2i (empagliflozin ) for added cardiovascular and renovascular protection. Patient does not have an OSAH diagnosis.  Functional capacity somewhat limited by patient's age and multiple medical comorbidities.  With that said, patient did complete all of his ADLs/IADLs without significant cardiovascular limitation.  Per the DASI, patient has been able to achieve at least 4 METS of physical activity without experiencing significant brief angina/anginal equivalent symptoms. No changes were made to his medication regimen during his visit with cardiology.  Patient scheduled to follow-up with outpatient cardiology in 3 months or sooner if needed.  [name] is scheduled for this CYSTOSCOPY WITH BIOPSY; CYSTOSCOPY WITH  RETROGRADE PYELOGRAM (Bilateral); INTRAVESICAL GEMCITABINE INSTALLATION on 07/21/2024 with Dr. [name], MD. Given patient's past medical history significant for cardiovascular diagnoses, presurgical cardiac clearance was sought by the PAT team. Per cardiology, this patient is optimized for surgery and may proceed with the planned procedural course with a LOW risk of significant perioperative cardiovascular complications.  In review of the patient's chart, it is noted that he is on daily oral antithrombotic therapy. Given that patient's past medical history is significant for cardiovascular diagnoses, including but not limited to CAD, urology has cleared patient to continue his daily low dose ASA throughout his perioperative course.  Patient has been updated on these directives from his specialty care providers by the PAT team.  Patient denies previous perioperative complications with anesthesia in the past. In review his EMR, it is noted that patient underwent a general anesthetic course at [facility] ([facility]) in 12/2022 without documented complications.   MOST RECENT VITAL SIGNS:    06/28/2024   10:33 AM 06/06/2024   11:24 AM 06/05/2024   11:12 AM  Vitals with BMI  Height  6' 0   Weight  214 lbs 2 oz   BMI  29.03   Systolic 135 148 830  Diastolic 72 78 105  Pulse 72 69    PROVIDERS/SPECIALISTS: NOTE: Primary physician provider listed below. Patient may have been seen by APP or partner within same practice.   PROVIDER ROLE / SPECIALTY LAST SHERLEAN Burnet Redell JAYSON, MD Urology (Surgeon) 06/28/2024  Fernand Fredy RAMAN, MD Primary Care Provider 06/05/2024  Fernand Alter, MD Cardiology 04/24/2024  Meeler, Rachell, NP-C Physiatry 07/05/2024   ALLERGIES: Allergies  Allergen Reactions   Metformin Diarrhea    CURRENT HOME MEDICATIONS: No current facility-administered medications for this encounter.    acetaminophen  (TYLENOL ) 500 MG tablet   amitriptyline  (ELAVIL ) 25 MG tablet    ascorbic acid (VITAMIN C) 500 MG tablet  aspirin  EC 81 MG tablet   carvedilol  (COREG ) 6.25 MG tablet   cetirizine  (ZYRTEC  ALLERGY) 10 MG tablet   chlorthalidone  (HYGROTON ) 25 MG tablet   Cholecalciferol  50 MCG (2000 UT) CAPS   cyanocobalamin  (VITAMIN B12) 500 MCG tablet   empagliflozin  (JARDIANCE ) 25 MG TABS tablet   fluticasone  (FLONASE ) 50 MCG/ACT nasal spray   glimepiride  (AMARYL ) 2 MG tablet   insulin  NPH-regular Human (NOVOLIN 70/30) (70-30) 100 UNIT/ML injection   lisinopril  (ZESTRIL ) 5 MG tablet   primidone  (MYSOLINE ) 50 MG tablet   rosuvastatin  (CRESTOR ) 40 MG tablet   tamsulosin  (FLOMAX ) 0.4 MG CAPS capsule   benzonatate (TESSALON) 100 MG capsule   ONETOUCH ULTRA TEST test strip    technetium sestamibi generic (CARDIOLITE ) injection 10 millicurie   HISTORY: Past Medical History:  Diagnosis Date   Abdominal wall hernia    a.) large; contained portions of the liver, RIGHT colon, and mid-distal small bowel   Aortic atherosclerosis (HCC)    Arthritis    Basal cell carcinoma of skin    Bifascicular block (RBBB + LPFB)    Bilateral carotid artery stenosis    a.) s/p PTA 08/26/2021 - 10 x 8 x 40 Exact stent RICA   Bladder tumor    BPH (benign prostatic hyperplasia)    Cerebral microvascular disease    Cervical spondylosis    Chronic midline thoracic back pain    Coronary artery disease    Diabetic polyneuropathy (HCC)    Diverticulosis    HFrEF (heart failure with reduced ejection fraction) (HCC)    History of 2019 novel coronavirus disease (COVID-19) 09/10/2020   a.) Tx'd with casirivimab/irdevimab infusion on 09/11/2020   History of kidney stones    HOH (hard of hearing)    Hypertension    Long-term use of aspirin  therapy    Lumbago of lumbar region with sciatica    Obesity    Pulmonary emphysema (HCC)    Renal cell cancer (HCC)    a.) s/p RIGHT nephrectomy   Spondylosis of thoracolumbar spine    T2DM (type 2 diabetes mellitus) (HCC)    Throat cancer (HCC)     Tremor    Past Surgical History:  Procedure Laterality Date   APPENDECTOMY     BACK SURGERY     BLADDER SURGERY     CAROTID PTA/STENT INTERVENTION Right 08/26/2021   Procedure: CAROTID PTA/STENT INTERVENTION;  Surgeon: Jama Cordella MATSU, MD;  Location: [name] INVASIVE CV LAB;  Service: Cardiovascular;  Laterality: Right;   CATARACT EXTRACTION W/PHACO Right 01/21/2023   Procedure: CATARACT EXTRACTION PHACO AND INTRAOCULAR LENS PLACEMENT (IOC) RIGHT DIABETIC  4.89  00:45.6;  Surgeon: Enola Feliciano Hugger, MD;  Location: [facility];  Service: Ophthalmology;  Laterality: Right;  Diabetic   CERVICAL SPINE SURGERY     CHOLECYSTECTOMY N/A 03/23/2018   Procedure: LAPAROSCOPIC CHOLECYSTECTOMY;  Surgeon: Rodolph Romano, MD;  Location: [name] ORS;  Service: General;  Laterality: N/A;   COLONOSCOPY     COLONOSCOPY WITH PROPOFOL  N/A 11/10/2017   Procedure: COLONOSCOPY WITH PROPOFOL ;  Surgeon: Toledo, Ladell POUR, MD;  Location: [name] ENDOSCOPY;  Service: Gastroenterology;  Laterality: N/A;   ESOPHAGOGASTRODUODENOSCOPY (EGD) WITH PROPOFOL  N/A 11/10/2017   Procedure: ESOPHAGOGASTRODUODENOSCOPY (EGD) WITH PROPOFOL ;  Surgeon: Toledo, Ladell POUR, MD;  Location: [name] ENDOSCOPY;  Service: Gastroenterology;  Laterality: N/A;   HERNIA REPAIR     left od surgery     MICROLARYNGOSCOPY N/A 11/05/2020   Procedure: MICROLARYNGOSCOPY WITH BIOPSIES;  Surgeon: Blair Mt, MD;  Location: [facility];  Service: ENT;  Laterality: N/A;   NEPHRECTOMY     right kidney nephrectomy   rt elbow surgery     THROAT SURGERY  1990   for cancer removal at [facility]   Family History  Problem Relation Age of Onset   Diabetes Brother    Hypertension Brother    CAD Brother    CAD Brother    Social History   Tobacco Use   Smoking status: Former    Current packs/day: 0.00    Types: Cigarettes    Quit date: 1991    Years since quitting: 34.5   Smokeless tobacco: Never  Substance Use Topics    Alcohol use: Not Currently    Comment: Occasional beer drinker   LABS:  Lab Results  Component Value Date   WBC 8.2 04/27/2024   HGB 13.8 04/27/2024   HCT 42.5 04/27/2024   MCV 78.6 (L) 04/27/2024   PLT 113 (L) 04/27/2024   Lab Results  Component Value Date   NA 136 04/27/2024   CL 104 04/27/2024   K 3.6 04/27/2024   CO2 27 04/27/2024   BUN 35 (H) 04/27/2024   CREATININE 1.11 04/27/2024   GFRNONAA >60 04/27/2024   CALCIUM  9.7 04/27/2024   ALBUMIN 4.0 04/25/2024   GLUCOSE 89 04/27/2024   Lab Results  Component Value Date   HGBA1C 6.7 (H) 04/13/2024   Hospital Outpatient Visit on 06/28/2024  Component Date Value Ref Range Status   Color, Urine 06/28/2024 YELLOW  YELLOW Final   APPearance 06/28/2024 CLEAR  CLEAR Final   Specific Gravity, Urine 06/28/2024 1.020  1.005 - 1.030 Final   pH 06/28/2024 7.0  5.0 - 8.0 Final   Glucose, UA 06/28/2024 >=500 (A)  NEGATIVE mg/dL Final   Hgb urine dipstick 06/28/2024 MODERATE (A)  NEGATIVE Final   Bilirubin Urine 06/28/2024 NEGATIVE  NEGATIVE Final   Ketones, ur 06/28/2024 NEGATIVE  NEGATIVE mg/dL Final   Protein, ur 92/97/7974 NEGATIVE  NEGATIVE mg/dL Final   Nitrite 92/97/7974 NEGATIVE  NEGATIVE Final   Leukocytes,Ua 06/28/2024 NEGATIVE  NEGATIVE Final   Squamous Epithelial / HPF 06/28/2024 0-5  0 - 5 /HPF Final   WBC, UA 06/28/2024 0-5  0 - 5 WBC/hpf Final   RBC / HPF 06/28/2024 >50  0 - 5 RBC/hpf Final   Bacteria, UA 06/28/2024 NONE SEEN  NONE SEEN Final   Performed at [facility], [address], [place], [place] [address]   Specimen Description 06/28/2024    Final                   Value:URINE, RANDOM Performed at [facility], [address] [place], [place] [address]    Special Requests 06/28/2024    Final                   Value:NONE Performed at [facility], [address], [place], [place] [address]    Culture 06/28/2024    Final                   Value:NO GROWTH Performed  at [facility], [address], [place], [place] [address]    Report Status 06/28/2024 06/29/2024 FINAL   Final    ECG: Date: 04/25/2024  Time ECG obtained: 1149 AM Rate: 95 bpm Rhythm: normal sinus; RBBB + LPFB Axis (leads I and aVF): normal Intervals: PR 206 ms. QRS 179 ms. QTc 503 ms. ST segment  and T wave changes: No evidence of acute T wave abnormalities or significant ST segment elevation or depression.  Evidence of a possible, age undetermined, prior infarct:  No Comparison: Similar to previous tracing obtained on 11/04/2020   IMAGING / PROCEDURES: MYOCARDIAL PERFUSION IMAGING STUDY (LEXISCAN) performed on 04/28/2024 Normal left ventricular systolic function with an EF of 64% No regional wall motion abnormalities Evidence of an inferoapical perfusion defect noted on stress images that is partially reversible at rest. Findings consistent with possible ischemia in the RCA territory.  CT CHEST ABDOMEN PELVIS W CONTRAST performed on 04/25/2024 No acute traumatic findings in the chest, abdomen, or pelvis. Focal nodular soft tissue thickening along the right bladder wall, not definitively visualized on the prior PET-CT dated 11/20/2020. Recommend correlation with clinical history and interval imaging, if available. Further evaluation with cystoscopy could be considered. Status post right nephrectomy. Large right lateral abdominal wall hernia is again noted containing portions of the liver, right colon, and mid to distal small bowel. No evidence of obstruction. Colonic diverticulosis without evidence of acute diverticulitis. Prostatomegaly. Aortic atherosclerosis  Emphysema  TRANSTHORACIC ECHOCARDIOGRAM performed on 04/05/2024 Technically adequate study.  Normal chamber sizes.  Normal left ventricular systolic function.  Mild left ventricular hypertrophy with GRADE 1 (relaxation abnormality) diastolic dysfunction.  Normal right ventricular systolic function.  Normal  right ventricular diastolic function.  Right ventricular diastolic dysfunction.  Normal left ventricular wall motion.  Normal right ventricular wall motion.  No pulmonary regurgitation.  No tricuspid regurgitation.  Normal pulmonary artery pressure.  Trace mitral regurgitation.  Fibrocalcified aortic valve with no aortic regurgitation.  No pericardial effusion.   VAS US  CAROTID performed on 03/10/2024 Velocities in the right ICA are consistent with a 1-39% stenosis. Non-hemodynamically significant plaque <50% noted in the CCA. The ECA appears <50% stenosed. Patent CCA/ICA stent with no evidence of re-stenosis.  Velocities in the left ICA are consistent with a 40-59% stenosis. Non-hemodynamically significant plaque <50% noted in the CCA. The ECA appears >50% stenosed.  Bilateral vertebral arteries demonstrate antegrade flow.  Normal flow hemodynamics were seen in bilateral subclavian arteries.   CT THORACIC AND LUMBAR SPINE WO CONTRAST performed on 01/25/2024 No acute osseous abnormality in the thoracic or lumbar spine. Mild thoracic and lumbar spondylosis without evidence of significant spinal stenosis. Mild-to-moderate multilevel neural foraminal stenosis in the lumbar spine. Colonic diverticulosis with mild inflammatory stranding adjacent to the distal descending colon which could reflect mild diverticulitis.  MR ABDOMEN W WO CONTRAST performed on 05/14/2023 Small bilateral adrenal nodules, without significant macroscopic fat content however unchanged in comparison to multiple prior examinations dating back to at least 2010 and definitively benign, largest in the lateral limb of the left adrenal gland measuring 1.3 x 1.0 cm. No further follow-up or characterization is required. Status post right nephrectomy. No evidence of recurrent or metastatic disease in the abdomen. Large right lateral abdominal wall hernia, incompletely imaged.  CT CORONARY MORPH W/CTA COR W/SCORE W/CA W/CM &/OR  WO/CM performed on 06/17/2021 Calcium  score: 1947.1  Left dominant system.  RCA very small non-dominant.  Severely calcified LAD diffuse with moderate disease in mid portion.  LCx is also severely calcified with 40% mid disease.  Treat medically.    IMPRESSION AND PLAN: JEFERSON BOOZER has been referred for pre-anesthesia review and clearance prior to him undergoing the planned anesthetic and procedural courses. Available labs, pertinent testing, and imaging results were personally reviewed by me in preparation for upcoming operative/procedural course. [place] medical record has been  updated following extensive record review and patient interview with PAT staff.   This patient has been appropriately cleared by cardiology with an overall LOW risk of patient experiencing significant perioperative cardiovascular complications. Based on clinical review performed today (07/18/24), barring any significant acute changes in the patient's overall condition, it is anticipated that he will be able to proceed with the planned surgical intervention. Any acute changes in clinical condition may necessitate his procedure being postponed and/or cancelled. Patient will meet with anesthesia team (MD and/or CRNA) on the day of his procedure for preoperative evaluation/assessment. Questions regarding anesthetic course will be fielded at that time.   Pre-surgical instructions were reviewed with the patient during his PAT appointment, and questions were fielded to satisfaction by PAT clinical staff. He has been instructed on which medications that he will need to hold prior to surgery, as well as the ones that have been deemed safe/appropriate to take on the day of his procedure. As part of the general education provided by PAT, patient made aware both verbally and in writing, that he would need to abstain from the use of any illegal substances during his perioperative course. He was advised that failure to follow the  provided instructions could necessitate case cancellation or result in serious perioperative complications up to and including death. Patient encouraged to contact PAT and/or his surgeon's office to discuss any questions or concerns that may arise prior to surgery; verbalized understanding.   Dorise Pereyra, MSN, APRN, FNP-C, CEN [facility]  Perioperative Services Nurse Practitioner Phone: [phone] Fax: [phone] 07/18/24 1:02 PM  NOTE: This note has been prepared using Dragon dictation software. Despite my best ability to proofread, there is always the potential that unintentional transcriptional errors may still occur from this process.

## 2024-07-20 ENCOUNTER — Encounter: Payer: Self-pay | Admitting: Urology

## 2024-07-21 ENCOUNTER — Ambulatory Visit
Admission: RE | Admit: 2024-07-21 | Discharge: 2024-07-21 | Disposition: A | Discharge status: Home / Self Care | Attending: Urology | Admitting: Urology

## 2024-07-21 ENCOUNTER — Encounter: Payer: Self-pay | Admitting: Urology

## 2024-07-21 ENCOUNTER — Other Ambulatory Visit: Payer: Self-pay

## 2024-07-21 ENCOUNTER — Ambulatory Visit

## 2024-07-21 ENCOUNTER — Ambulatory Visit: Payer: Self-pay | Admitting: Urgent Care

## 2024-07-21 ENCOUNTER — Encounter
Admission: RE | Disposition: A | Discharge status: Home / Self Care | Payer: Self-pay | Source: Home / Self Care | Attending: Urology

## 2024-07-21 DIAGNOSIS — Z8521 Personal history of malignant neoplasm of larynx: Secondary | ICD-10-CM | POA: Diagnosis not present

## 2024-07-21 DIAGNOSIS — I110 Hypertensive heart disease with heart failure: Secondary | ICD-10-CM | POA: Insufficient documentation

## 2024-07-21 DIAGNOSIS — I5022 Chronic systolic (congestive) heart failure: Secondary | ICD-10-CM | POA: Diagnosis not present

## 2024-07-21 DIAGNOSIS — C679 Malignant neoplasm of bladder, unspecified: Secondary | ICD-10-CM | POA: Diagnosis not present

## 2024-07-21 DIAGNOSIS — C672 Malignant neoplasm of lateral wall of bladder: Secondary | ICD-10-CM

## 2024-07-21 DIAGNOSIS — Z95828 Presence of other vascular implants and grafts: Secondary | ICD-10-CM | POA: Diagnosis not present

## 2024-07-21 DIAGNOSIS — F17210 Nicotine dependence, cigarettes, uncomplicated: Secondary | ICD-10-CM | POA: Insufficient documentation

## 2024-07-21 DIAGNOSIS — C674 Malignant neoplasm of posterior wall of bladder: Secondary | ICD-10-CM | POA: Insufficient documentation

## 2024-07-21 DIAGNOSIS — Z87891 Personal history of nicotine dependence: Secondary | ICD-10-CM | POA: Diagnosis not present

## 2024-07-21 DIAGNOSIS — Z923 Personal history of irradiation: Secondary | ICD-10-CM | POA: Diagnosis not present

## 2024-07-21 DIAGNOSIS — I2510 Atherosclerotic heart disease of native coronary artery without angina pectoris: Secondary | ICD-10-CM | POA: Insufficient documentation

## 2024-07-21 DIAGNOSIS — Z794 Long term (current) use of insulin: Secondary | ICD-10-CM | POA: Insufficient documentation

## 2024-07-21 DIAGNOSIS — I452 Bifascicular block: Secondary | ICD-10-CM | POA: Diagnosis not present

## 2024-07-21 DIAGNOSIS — D494 Neoplasm of unspecified behavior of bladder: Secondary | ICD-10-CM

## 2024-07-21 DIAGNOSIS — I5042 Chronic combined systolic (congestive) and diastolic (congestive) heart failure: Secondary | ICD-10-CM | POA: Diagnosis not present

## 2024-07-21 DIAGNOSIS — E1151 Type 2 diabetes mellitus with diabetic peripheral angiopathy without gangrene: Secondary | ICD-10-CM | POA: Diagnosis not present

## 2024-07-21 HISTORY — DX: Emphysema, unspecified: J43.9

## 2024-07-21 HISTORY — DX: Long term (current) use of aspirin: Z79.82

## 2024-07-21 HISTORY — DX: Other cerebrovascular disease: I67.89

## 2024-07-21 HISTORY — DX: Bifascicular block: I45.2

## 2024-07-21 HISTORY — DX: Ventral hernia without obstruction or gangrene: K43.9

## 2024-07-21 HISTORY — DX: Type 2 diabetes mellitus with diabetic polyneuropathy: E11.42

## 2024-07-21 HISTORY — DX: Type 2 diabetes mellitus without complications: E11.9

## 2024-07-21 HISTORY — DX: Basal cell carcinoma of skin, unspecified: C44.91

## 2024-07-21 HISTORY — DX: Spondylosis without myelopathy or radiculopathy, thoracolumbar region: M47.815

## 2024-07-21 HISTORY — DX: Spondylosis without myelopathy or radiculopathy, cervical region: M47.812

## 2024-07-21 HISTORY — DX: Diverticulosis of intestine, part unspecified, without perforation or abscess without bleeding: K57.90

## 2024-07-21 HISTORY — DX: Unspecified systolic (congestive) heart failure: I50.20

## 2024-07-21 HISTORY — DX: Benign prostatic hyperplasia without lower urinary tract symptoms: N40.0

## 2024-07-21 HISTORY — PX: CYSTOSCOPY W/ RETROGRADES: SHX1426

## 2024-07-21 HISTORY — DX: Neoplasm of unspecified behavior of bladder: D49.4

## 2024-07-21 HISTORY — PX: CYSTOSCOPY WITH BIOPSY: SHX5122

## 2024-07-21 HISTORY — PX: TRANSURETHRAL RESECTION OF BLADDER TUMOR: SHX2575

## 2024-07-21 HISTORY — DX: Atherosclerosis of aorta: I70.0

## 2024-07-21 HISTORY — PX: BLADDER INSTILLATION: SHX6893

## 2024-07-21 LAB — GLUCOSE, CAPILLARY
Glucose-Capillary: 171 mg/dL — ABNORMAL HIGH (ref 70–99)
Glucose-Capillary: 150 mg/dL — ABNORMAL HIGH (ref 70–99)

## 2024-07-21 SURGERY — CYSTOSCOPY, WITH BIOPSY
Anesthesia: General

## 2024-07-21 MED ORDER — SODIUM CHLORIDE 0.9 % IR SOLN
Status: DC | PRN
  Administered 2024-07-21: 6000 mL

## 2024-07-21 MED ORDER — FENTANYL CITRATE (PF) 100 MCG/2ML IJ SOLN
INTRAMUSCULAR | Status: AC
Start: 2024-07-21 — End: 2024-07-21
  Filled 2024-07-21: qty 2

## 2024-07-21 MED ORDER — GEMCITABINE CHEMO FOR BLADDER INSTILLATION 2000 MG
2000.0000 mg | Freq: Once | INTRAVENOUS | Status: AC
  Administered 2024-07-21: 2000 mg via INTRAVESICAL
  Filled 2024-07-21: qty 2000

## 2024-07-21 MED ORDER — PROPOFOL 10 MG/ML IV BOLUS
INTRAVENOUS | Status: DC | PRN
  Administered 2024-07-21: 120 mg via INTRAVENOUS

## 2024-07-21 MED ORDER — OXYCODONE HCL 5 MG PO TABS: 5.0000 mg | ORAL_TABLET | Freq: Once | ORAL | Status: DC | PRN

## 2024-07-21 MED ORDER — FENTANYL CITRATE (PF) 100 MCG/2ML IJ SOLN
INTRAMUSCULAR | Status: DC | PRN
  Administered 2024-07-21 (×4): 25 ug via INTRAVENOUS

## 2024-07-21 MED ORDER — CHLORHEXIDINE GLUCONATE 0.12 % MT SOLN
OROMUCOSAL | Status: AC
  Filled 2024-07-21: qty 15

## 2024-07-21 MED ORDER — SUGAMMADEX SODIUM 200 MG/2ML IV SOLN
INTRAVENOUS | Status: DC | PRN
  Administered 2024-07-21: 200 mg via INTRAVENOUS

## 2024-07-21 MED ORDER — DROPERIDOL 2.5 MG/ML IJ SOLN: 0.6250 mg | Freq: Once | INTRAMUSCULAR | Status: DC | PRN

## 2024-07-21 MED ORDER — OXYCODONE HCL 5 MG/5ML PO SOLN: 5.0000 mg | Freq: Once | ORAL | Status: DC | PRN

## 2024-07-21 MED ORDER — DEXAMETHASONE SODIUM PHOSPHATE 10 MG/ML IJ SOLN
INTRAMUSCULAR | Status: DC | PRN
  Administered 2024-07-21: 5 mg via INTRAVENOUS

## 2024-07-21 MED ORDER — CEFAZOLIN SODIUM-DEXTROSE 2-4 GM/100ML-% IV SOLN
2.0000 g | INTRAVENOUS | Status: AC
  Administered 2024-07-21: 2 g via INTRAVENOUS

## 2024-07-21 MED ORDER — STERILE WATER FOR IRRIGATION IR SOLN
Status: DC | PRN
  Administered 2024-07-21: 2000 mL
  Administered 2024-07-21: 1

## 2024-07-21 MED ORDER — ROCURONIUM BROMIDE 100 MG/10ML IV SOLN
INTRAVENOUS | Status: DC | PRN
  Administered 2024-07-21: 50 mg via INTRAVENOUS

## 2024-07-21 MED ORDER — SODIUM CHLORIDE 0.9 % IV SOLN: INTRAVENOUS | Status: DC

## 2024-07-21 MED ORDER — ONDANSETRON HCL 4 MG/2ML IJ SOLN
INTRAMUSCULAR | Status: DC | PRN
  Administered 2024-07-21: 4 mg via INTRAVENOUS

## 2024-07-21 MED ORDER — ACETAMINOPHEN 10 MG/ML IV SOLN: 1000.0000 mg | Freq: Once | INTRAVENOUS | Status: DC | PRN

## 2024-07-21 MED ORDER — CHLORHEXIDINE GLUCONATE 0.12 % MT SOLN
15.0000 mL | Freq: Once | OROMUCOSAL | Status: AC
  Administered 2024-07-21: 15 mL via OROMUCOSAL

## 2024-07-21 MED ORDER — FENTANYL CITRATE (PF) 100 MCG/2ML IJ SOLN: 25.0000 ug | INTRAMUSCULAR | Status: DC | PRN

## 2024-07-21 MED ORDER — LIDOCAINE HCL (CARDIAC) PF 100 MG/5ML IV SOSY
PREFILLED_SYRINGE | INTRAVENOUS | Status: DC | PRN
  Administered 2024-07-21: 60 mg via INTRAVENOUS

## 2024-07-21 MED ORDER — IOHEXOL 180 MG/ML  SOLN
INTRAMUSCULAR | Status: DC | PRN
  Administered 2024-07-21: 20 mL

## 2024-07-21 MED ORDER — ORAL CARE MOUTH RINSE: 15.0000 mL | Freq: Once | OROMUCOSAL | Status: AC

## 2024-07-21 MED ORDER — CEFAZOLIN SODIUM-DEXTROSE 2-4 GM/100ML-% IV SOLN
INTRAVENOUS | Status: AC
  Filled 2024-07-21: qty 100

## 2024-07-21 SURGICAL SUPPLY — 20 items
ADAPTER IRRIG TUBE 2 SPIKE SOL (ADAPTER) IMPLANT
BAG DRAIN SIEMENS DORNER NS (MISCELLANEOUS) ×3 IMPLANT
BRUSH SCRUB EZ 4% CHG (MISCELLANEOUS) ×3 IMPLANT
CATH FOL 2WAY LX 20X30 (CATHETERS) IMPLANT
CATH URETL OPEN 5X70 (CATHETERS) ×3 IMPLANT
DRAPE UTILITY 15X26 TOWEL STRL (DRAPES) ×3 IMPLANT
DRSG TELFA 3X4 N-ADH STERILE (GAUZE/BANDAGES/DRESSINGS) ×3 IMPLANT
ELECTRODE LOOP 22F BIPOLAR SML (ELECTROSURGICAL) IMPLANT
ELECTRODE REM PT RTRN 9FT ADLT (ELECTROSURGICAL) ×3 IMPLANT
GLOVE BIOGEL PI IND STRL 7.5 (GLOVE) ×3 IMPLANT
GOWN STRL REUS W/ TWL LRG LVL3 (GOWN DISPOSABLE) ×3 IMPLANT
GOWN STRL REUS W/ TWL XL LVL3 (GOWN DISPOSABLE) ×3 IMPLANT
GUIDEWIRE STR DUAL SENSOR (WIRE) ×3 IMPLANT
KIT TURNOVER CYSTO (KITS) ×3 IMPLANT
PACK CYSTO AR (MISCELLANEOUS) ×3 IMPLANT
SET CYSTO W/LG BORE CLAMP LF (SET/KITS/TRAYS/PACK) ×3 IMPLANT
SOL .9 NS 3000ML IRR UROMATIC (IV SOLUTION) ×3 IMPLANT
SURGILUBE 2OZ TUBE FLIPTOP (MISCELLANEOUS) ×3 IMPLANT
WATER STERILE IRR 3000ML UROMA (IV SOLUTION) ×3 IMPLANT
WATER STERILE IRR 500ML POUR (IV SOLUTION) ×3 IMPLANT

## 2024-07-21 NOTE — Transfer of Care (Signed)
 Immediate Anesthesia Transfer of Care Note  Patient: [name]  Procedure(s) Performed: CYSTOSCOPY, WITH BIOPSY CYSTOSCOPY, WITH RETROGRADE PYELOGRAM (Bilateral) INSTILLATION, BLADDER TURBT (TRANSURETHRAL RESECTION OF BLADDER TUMOR)  Patient Location: PACU  Anesthesia Type:General  Level of Consciousness: awake and alert   Airway & Oxygen Therapy: Patient Spontanous Breathing and Patient connected to nasal cannula oxygen  Post-op Assessment: Report given to RN and Post -op Vital signs reviewed and stable  Post vital signs: Reviewed and stable  Last Vitals:  Vitals Value Taken Time  BP 172/74 07/21/24 10:42  Temp    Pulse 68 07/21/24 10:45  Resp 17 07/21/24 10:45  SpO2 100 % 07/21/24 10:45  Vitals shown include unfiled device data.  Last Pain:  Vitals:   07/21/24 0912  TempSrc: Temporal  PainSc: 0-No pain         Complications: No notable events documented.

## 2024-07-21 NOTE — H&P (Signed)
 07/21/24 9:39 AM   [name] 1946-08-13 [id number]  CC: Bladder tumor  HPI: Comorbid 78 year old male with distant history of right nephro ureterectomy with Dr. Ike, now with bladder tumor recurrence.  Here today for biopsy and fulguration, left retrograde pyelogram   PMH: Past Medical History:  Diagnosis Date   Abdominal wall hernia    a.) large; contained portions of the liver, RIGHT colon, and mid-distal small bowel   Aortic atherosclerosis (HCC)    Arthritis    Basal cell carcinoma of skin    Bifascicular block (RBBB + LPFB)    Bilateral carotid artery stenosis    a.) s/p PTA 08/26/2021 - 10 x 8 x 40 Exact stent RICA   Bladder tumor    BPH (benign prostatic hyperplasia)    Cerebral microvascular disease    Cervical spondylosis    Chronic midline thoracic back pain    Coronary artery disease    Diabetic polyneuropathy (HCC)    Diverticulosis    HFrEF (heart failure with reduced ejection fraction) (HCC)    History of 2019 novel coronavirus disease (COVID-19) 09/10/2020   a.) Tx'd with casirivimab/irdevimab infusion on 09/11/2020   History of kidney stones    HOH (hard of hearing)    Hypertension    Long-term use of aspirin  therapy    Lumbago of lumbar region with sciatica    Obesity    Pulmonary emphysema (HCC)    Renal cell cancer (HCC)    a.) s/p RIGHT nephrectomy   Spondylosis of thoracolumbar spine    T2DM (type 2 diabetes mellitus) (HCC)    Throat cancer (HCC)    Tremor     Surgical History: Past Surgical History:  Procedure Laterality Date   APPENDECTOMY     BACK SURGERY     BLADDER SURGERY     CAROTID PTA/STENT INTERVENTION Right 08/26/2021   Procedure: CAROTID PTA/STENT INTERVENTION;  Surgeon: Jama Cordella KANDICE, MD;  Location: [name] INVASIVE CV [facility];  Service: Cardiovascular;  Laterality: Right;   CATARACT EXTRACTION W/PHACO Right 01/21/2023   Procedure: CATARACT EXTRACTION PHACO AND INTRAOCULAR LENS PLACEMENT (IOC) RIGHT DIABETIC  4.89   00:45.6;  Surgeon: Enola Feliciano Hugger, MD;  Location: [facility];  Service: Ophthalmology;  Laterality: Right;  Diabetic   CERVICAL SPINE SURGERY     CHOLECYSTECTOMY N/A 03/23/2018   Procedure: LAPAROSCOPIC CHOLECYSTECTOMY;  Surgeon: Rodolph Romano, MD;  Location: [name] ORS;  Service: General;  Laterality: N/A;   COLONOSCOPY     COLONOSCOPY WITH PROPOFOL  N/A 11/10/2017   Procedure: COLONOSCOPY WITH PROPOFOL ;  Surgeon: Toledo, Ladell POUR, MD;  Location: [name] ENDOSCOPY;  Service: Gastroenterology;  Laterality: N/A;   ESOPHAGOGASTRODUODENOSCOPY (EGD) WITH PROPOFOL  N/A 11/10/2017   Procedure: ESOPHAGOGASTRODUODENOSCOPY (EGD) WITH PROPOFOL ;  Surgeon: Toledo, Ladell POUR, MD;  Location: [name] ENDOSCOPY;  Service: Gastroenterology;  Laterality: N/A;   HERNIA REPAIR     left od surgery     MICROLARYNGOSCOPY N/A 11/05/2020   Procedure: MICROLARYNGOSCOPY WITH BIOPSIES;  Surgeon: Blair Mt, MD;  Location: [facility];  Service: ENT;  Laterality: N/A;   NEPHRECTOMY     right kidney nephrectomy   rt elbow surgery     THROAT SURGERY  1990   for cancer removal at [facility]      Family History: Family History  Problem Relation Age of Onset   Diabetes Brother    Hypertension Brother    CAD Brother    CAD Brother     Social History:  reports that he  quit smoking about 34 years ago. His smoking use included cigarettes. He has never used smokeless tobacco. He reports that he does not currently use alcohol. He reports that he does not use drugs.  Physical Exam: BP (!) 183/79   Pulse 65   Temp 97.9 F (36.6 C) (Temporal)   Resp 17   Ht 6' (1.829 m)   Wt 94.8 kg   SpO2 100%   BMI 28.35 kg/m    Constitutional:  Alert and oriented, No acute distress. Cardiovascular: Regular rate and rhythm Respiratory: Clear to auscultation bilaterally GI: Abdomen is soft, nontender, nondistended, no abdominal masses   Laboratory Data: Culture 7/2 no growth  Assessment &  Plan:   Comorbid 78 year old male with distant history of upper tract urothelial cell carcinoma treated with right nephro ureterectomy by Dr. Ike in 2006, now with bladder tumor recurrence.  We discussed transurethral resection of bladder tumor (TURBT) and risks and benefits at length. This is typically a 1 to 2-hour procedure done under general anesthesia in the operating room.  A scope is inserted through the urethra and used to resect abnormal tissue within the bladder, which is then sent to the pathologist to determine grade and stage of the tumor.  Risks include bleeding, infection, need for temporary Foley placement, and bladder perforation.  Treatment strategies are based on the type of tumor and depth of invasion.  We briefly reviewed the different treatment pathways for non-muscle invasive and muscle invasive bladder cancer.  Bladder biopsy and fulguration today, left retrograde pyelogram, gemcitabine   [name], MD 07/21/2024  [facility] [address] [place], [place] [address] [phone]

## 2024-07-21 NOTE — Anesthesia Preprocedure Evaluation (Addendum)
 Anesthesia Evaluation  Patient identified by MRN, date of birth, ID band Patient awake    Reviewed: Allergy & Precautions, H&P , NPO status , Patient's Chart, lab work & pertinent test results  History of Anesthesia Complications Negative for: history of anesthetic complications  Airway Mallampati: II  TM Distance: >3 FB Neck ROM: full    Dental  (+) Edentulous Upper, Edentulous Lower   Pulmonary shortness of breath, neg sleep apnea, neg COPD, Patient abstained from smoking.Not current smoker, former smoker Prior laryngeal cancer s/p surgery and radiation. No neck stiffness.   Pulmonary exam normal breath sounds clear to auscultation       Cardiovascular Exercise Tolerance: Good METShypertension, + CAD, + Peripheral Vascular Disease (CAROTID PTA/STENT INTERVENTION 2022) and +CHF  (-) Past MI Normal cardiovascular exam+ dysrhythmias (Bifascicular block (RBBB + LPFB))  Rhythm:regular Rate:Normal     Coronary CTA performed on 06/17/2021 revealing an Agatston coronary calcium  score of 1947.1.  Severe diffuse LAD calcification with moderate disease in the midportion.  LCx also severely calcified with 40% mid disease.  RCA very small and nondominant.  Left dominant system.  Recommendations were for medical management of patient's coronary artery disease.    Patient with a history of BILATERAL carotid artery disease.  He underwent PTA on 08/26/2021, at which time a 10 x 8 x 40 Exact Stent was placed to the RIGHT internal carotid artery.  Most recent carotid duplex was performed on 09/11/2022 revealing a 1-39% stenosis in the RICA with a contralateral 40-59% stenosis in the LICA.  Vertebral arteries demonstrate antegrade flow.  There were normal flow hemodynamics within the subclavian arteries.    Most recent myocardial perfusion imaging study was performed on 04/20/2024 revealing a normal left ventricular systolic function with an EF of 64%.   There were no regional wall motion abnormalities.  No artifact or left ventricular cavity size enlargement appreciated on review of imaging. SPECT images demonstrated evidence of an inferoapical perfusion defect on stress imaging that was partially reversible at rest.  Study findings consistent with possible ischemia in the RCA territory.    Most recent TTE performed on 04/25/2024 revealed a normal left ventricular systolic function with an EF of 55-60%. There was mild LVH.  There were no regional wall motion abnormalities. Left ventricular diastolic Doppler parameters consistent with abnormal relaxation (G1DD).  Right ventricular size and function normal with a TAPSE measuring 1.9 cm  (normal range >/= 1.6 cm).  Fibrocalcified aortic valve with no evidence of significant regurgitation was observed. There was trivial mitral valve regurgitation.  All transvalvular gradients were noted to be normal providing no evidence of hemodynamically significant valvular stenosis. Aorta normal in size with no evidence of ectasia or aneurysmal dilatation.    Neuro/Psych  chronic cerebral microvascular disease  Neuromuscular disease  negative psych ROS   GI/Hepatic ,neg GERD  ,,(+)     (-) substance abuse    Endo/Other  diabetes, Well Controlled, Insulin  Dependent    Renal/GU      Musculoskeletal  (+) Arthritis ,    Abdominal Normal abdominal exam  (+)   Peds  Hematology  (+) Blood dyscrasia (thrombocytopenia)   Anesthesia Other Findings Past Medical History: No date: Back pain No date: Bladder cancer (HCC) 09/10/2020: COVID-19     Comment:  Received Casirivimab/Irdevimab infusion on 09/11/20 No date: Diabetes mellitus without complication (HCC) No date: HOH (hard of hearing) No date: Hypertension No date: Obesity No date: Renal cancer (HCC) No date: Renal cell cancer (HCC) No  date: Throat cancer (HCC) No date: Tremor  Reproductive/Obstetrics                               Anesthesia Physical Anesthesia Plan  ASA: 3  Anesthesia Plan: General   Post-op Pain Management: Ofirmev  IV (intra-op)*   Induction: Intravenous  PONV Risk Score and Plan: 1 and Ondansetron  and Dexamethasone   Airway Management Planned: LMA  Additional Equipment:   Intra-op Plan:   Post-operative Plan: Extubation in OR  Informed Consent: I have reviewed the patients History and Physical, chart, labs and discussed the procedure including the risks, benefits and alternatives for the proposed anesthesia with the patient or authorized representative who has indicated his/her understanding and acceptance.       Plan Discussed with: CRNA and Surgeon  Anesthesia Plan Comments:          Anesthesia Quick Evaluation

## 2024-07-21 NOTE — Anesthesia Procedure Notes (Signed)
 Procedure Name: Intubation Date/Time: 07/21/2024 10:00 AM  Performed by: Niki Manus SAUNDERS, CRNAPre-anesthesia Checklist: Patient identified, Patient being monitored, Timeout performed, Emergency Drugs available and Suction available Patient Re-evaluated:Patient Re-evaluated prior to induction Oxygen Delivery Method: Circle system utilized Preoxygenation: Pre-oxygenation with 100% oxygen Induction Type: IV induction Ventilation: Mask ventilation without difficulty Laryngoscope Size: McGrath and 4 Grade View: Grade I Tube type: Oral Tube size: 7.5 mm Number of attempts: 1 Airway Equipment and Method: Stylet Placement Confirmation: ETT inserted through vocal cords under direct vision, positive ETCO2 and breath sounds checked- equal and bilateral Secured at: 21 cm Tube secured with: Tape Dental Injury: Teeth and Oropharynx as per pre-operative assessment

## 2024-07-21 NOTE — Anesthesia Postprocedure Evaluation (Signed)
 Anesthesia Post Note  Patient: CALIB WADHWA  Procedure(s) Performed: CYSTOSCOPY, WITH BIOPSY CYSTOSCOPY, WITH RETROGRADE PYELOGRAM (Bilateral) INSTILLATION, BLADDER TURBT (TRANSURETHRAL RESECTION OF BLADDER TUMOR)  Patient location during evaluation: PACU Anesthesia Type: General Level of consciousness: awake and alert Pain management: pain level controlled Vital Signs Assessment: post-procedure vital signs reviewed and stable Respiratory status: spontaneous breathing, nonlabored ventilation and respiratory function stable Cardiovascular status: blood pressure returned to baseline and stable Postop Assessment: no apparent nausea or vomiting Anesthetic complications: no   No notable events documented.   Last Vitals:  Vitals:   07/21/24 1145 07/21/24 1203  BP: (!) 168/74 (!) 178/66  Pulse: 65 65  Resp: 11 20  Temp: 36.4 C (!) 36 C  SpO2: 97% 99%    Last Pain:  Vitals:   07/21/24 1203  TempSrc: Temporal  PainSc: 0-No pain                 Camellia Merilee Louder

## 2024-07-21 NOTE — Op Note (Signed)
 Date of procedure: 07/21/24  Preoperative diagnosis:  Bladder tumor  Postoperative diagnosis:  Same  Procedure: Cystoscopy Left retrograde pyelogram with intraoperative interpretation TURBT, 2cm bladder tumor Intravesical instillation of gemcitabine  Surgeon: [name], MD  Anesthesia: General  Complications: None  Intraoperative findings:  Moderate size prostate, 2 cm papillary bladder tumor right posterior lateral wall Left retrograde pyelogram with no filling defects  EBL: Minimal  Specimens: Bladder tumor  Drains: 20 French two-way Foley  Indication: [name] is a 78 y.o. patient with distant history of right nephro ureterectomy in 2006 found to have recent recurrence in the bladder on CT.  After reviewing the management options for treatment, they elected to proceed with the above surgical procedure(s). We have discussed the potential benefits and risks of the procedure, side effects of the proposed treatment, the likelihood of the patient achieving the goals of the procedure, and any potential problems that might occur during the procedure or recuperation. Informed consent has been obtained.  Description of procedure:  The patient was taken to the operating room and general anesthesia was induced. SCDs were placed for DVT prophylaxis. The patient was placed in the dorsal lithotomy position, prepped and draped in the usual sterile fashion, and preoperative antibiotics were administered. A preoperative time-out was performed.   A 21 French rigid cystoscope was used to intubate the urethra and a normal-appearing urethra was followed proximally to the bladder.  Prostate was moderate in size.  Thorough cystoscopy showed a 2 cm papillary tumor at the right posterior lateral wall.  The right ureteral orifice was surgically absent.  Left ureteral orifice was close to the bladder neck.  A sensor wire was used to intubate the left ureteral orifice and a 5 French access  catheter advanced a few centimeters into the ureter.  Retrograde pyelogram was performed which showed no filling defects or hydronephrosis.  A 26 French resectoscope was then placed using a visual obturator and the 2 cm papillary tumor at the right posterior lateral wall was resected using the small bipolar loop.  This was very challenging secondary to his anatomy, and ultimately I needed to use the cold cup biopsy forceps to remove the residual base of the tumor.  The bipolar loop was used for meticulous hemostasis.  No tumor remained.  No bleeding noted with the bladder decompressed.  The bladder was thoroughly irrigated, tumor chips sent for pathology.  A 20 [place] two-way Foley passed easily into the bladder with return of clear fluid, 10 mL replaced in the balloon.  The bladder was irrigated with 500 mL of sterile water and remained clear.  The bladder was drained, and 2 g / 50 mL gemcitabine were instilled into the bladder and the catheter clamped.  Disposition: Stable to PACU  Plan: -Unclamp Foley at 11:40 AM and allow gemcitabine to drain, Foley can be removed prior to discharge -Will call with pathology results, with his comorbidities would anticipate 80-month follow-up for cystoscopy  [name], MD

## 2024-07-22 ENCOUNTER — Encounter: Payer: Self-pay | Admitting: Urology

## 2024-07-24 ENCOUNTER — Ambulatory Visit: Admitting: Cardiovascular Disease

## 2024-07-24 LAB — SURGICAL PATHOLOGY

## 2024-07-31 ENCOUNTER — Other Ambulatory Visit: Payer: Self-pay | Admitting: Family

## 2024-08-01 ENCOUNTER — Ambulatory Visit: Admitting: Urology

## 2024-08-01 ENCOUNTER — Other Ambulatory Visit: Payer: Self-pay

## 2024-08-01 ENCOUNTER — Other Ambulatory Visit
Admission: RE | Admit: 2024-08-01 | Discharge: 2024-08-01 | Disposition: A | Discharge status: Home / Self Care | Attending: Urology | Admitting: Urology

## 2024-08-01 VITALS — BP 139/71 | HR 67 | Ht 72.0 in | Wt 214.0 lb

## 2024-08-01 DIAGNOSIS — D494 Neoplasm of unspecified behavior of bladder: Secondary | ICD-10-CM | POA: Insufficient documentation

## 2024-08-01 DIAGNOSIS — Z01812 Encounter for preprocedural laboratory examination: Secondary | ICD-10-CM | POA: Insufficient documentation

## 2024-08-01 LAB — URINALYSIS, COMPLETE (UACMP) WITH MICROSCOPIC
Bilirubin Urine: NEGATIVE
Glucose, UA: 500 mg/dL — AB
Ketones, ur: NEGATIVE mg/dL
Leukocytes,Ua: NEGATIVE
Nitrite: NEGATIVE
Protein, ur: NEGATIVE mg/dL
Specific Gravity, Urine: 1.02 (ref 1.005–1.030)
Squamous Epithelial / HPF: NONE SEEN /HPF (ref 0–5)
WBC, UA: NONE SEEN WBC/hpf (ref 0–5)
pH: 7.5 (ref 5.0–8.0)

## 2024-08-01 NOTE — Patient Instructions (Signed)
 Transurethral Resection of Bladder Tumor  Transurethral resection of a bladder tumor is the removal (resection) of cancerous tissue (tumor) from the inside wall of the bladder. The bladder is the organ that holds urine. The tumor is removed through the tube that carries urine out of the body (urethra). In a transurethral resection, a thin telescope with a light, a tiny camera, and an electric cutting edge (resectoscope) is passed through the urethra. In men, the opening of the urethra is at the end of the penis. In women, it is just above the opening of the vagina. Tell a health care provider about: Any allergies you have. All medicines you are taking, including vitamins, herbs, eye drops, creams, and over-the-counter medicines. Any problems you or family members have had with anesthetic medicines. Any bleeding problems you have. Any surgeries you have had. Any medical conditions you have, including recent urinary tract infections. Whether you are pregnant or may be pregnant. What are the risks? Generally, this is a safe procedure. However, problems may occur, including: Infection. Bleeding. Allergic reactions to medicines. Damage to nearby structures or organs. Difficulty urinating from blockage of the urethra or not being able to urinate (urinary retention). Deep vein thrombosis. This is a blood clot that can develop in your leg. Recurring cancer. What happens before the procedure? When to stop eating and drinking Follow instructions from your health care provider about what you may eat and drink before your procedure. These may include: 8 hours before your procedure Stop eating most foods. Do not eat meat, fried foods, or fatty foods. Eat only light foods, such as toast or crackers. All liquids are okay except energy drinks and alcohol. 6 hours before your procedure Stop eating. Drink only clear liquids, such as water, clear fruit juice, black coffee, plain tea, and sports  drinks. Do not drink energy drinks or alcohol. 2 hours before your procedure Stop drinking all liquids. You may be allowed to take medicines with small sips of water. Medicines Ask your health care provider about: Changing or stopping your regular medicines. This is especially important if you are taking diabetes medicines or blood thinners. Taking medicines such as aspirin and ibuprofen. These medicines can thin your blood. Do not take these medicines unless your health care provider tells you to take them. Taking over-the-counter medicines, vitamins, herbs, and supplements. General instructions If you will be going home right after the procedure, plan to have a responsible adult: Take you home from the hospital or clinic. You will not be allowed to drive. Care for you for the time you are told. Ask your health care provider what steps will be taken to help prevent infection. These steps may include: Washing skin with a germ-killing soap. Taking antibiotic medicine. Do not use any products that contain nicotine or tobacco for at least 4 weeks before the procedure. These products include cigarettes, chewing tobacco, and vaping devices, such as e-cigarettes. If you need help quitting, ask your health care provider. What happens during the procedure? An IV will be inserted into one of your veins. You will be given one or more of the following: A medicine to help you relax (sedative). A medicine that is injected into your spine to numb the area below and slightly above the injection site (spinal anesthetic). A medicine that is injected into an area of your body to numb everything below the injection site (regional anesthetic). A medicine to make you fall asleep (general anesthetic). Your legs will be placed in foot rests (  stirrups) to open your legs and bend your knees. The resectoscope will be passed through your urethra and into your bladder. The part of your bladder with the tumor will be  resected by the cutting edge of the resectoscope. Fluid will be passed to rinse out the cut tissues (irrigation). The resectoscope will then be taken out. A small, thin tube (catheter) will be passed through your urethra and into your bladder. The catheter will drain urine into a bag outside of your body. The procedure may vary among health care providers and hospitals. What happens after the procedure? Your blood pressure, heart rate, breathing rate, and blood oxygen level will be monitored until you leave the hospital or clinic. You may continue to receive fluids and medicines through an IV. You will be given pain medicine to relieve pain. You will have a catheter to drain your urine. The amount of urine will be measured. If you have blood in your urine, your bladder may be rinsed out by passing fluid through your catheter. You will be encouraged to walk as soon as you can. You may have to wear compression stockings. These stockings help to prevent blood clots and reduce swelling in your legs. If you were given a sedative during the procedure, it can affect you for several hours. Do not drive or operate machinery until your health care provider says that it is safe. Summary Transurethral resection of a bladder tumor is the removal (resection) of a cancerous growth (tumor) on the inside wall of the bladder. To do this procedure, your health care provider uses a thin telescope with a light, a tiny camera, and an electric cutting edge (resectoscope) that is guided to your bladder through your urethra. The part of your bladder that is affected by the tumor will be resected by the cutting edge of the resectoscope. A catheter will be passed through your urethra and into your bladder. The catheter will drain urine into a bag outside of your body. If you will be going home right after the procedure, plan to have a responsible adult take you home from the hospital or clinic. You will not be allowed to  drive. This information is not intended to replace advice given to you by your health care provider. Make sure you discuss any questions you have with your health care provider. Document Revised: 12/19/2021 Document Reviewed: 12/19/2021 Elsevier Patient Education  2024 [employer].

## 2024-08-01 NOTE — Progress Notes (Signed)
 Surgical Physician Order Form [facility] [place]  Dr. [name], MD  * Scheduling expectation : 4 to 6 weeks from 7/25  *Length of Case: 1 hour  *Clearance needed: no  *Anticoagulation Instructions: Hold all anticoagulants  *Aspirin  Instructions: Hold Aspirin   *Post-op visit Date/Instructions: 1 week discussed path  *Diagnosis: Bladder Tumor  *Procedure:  TURBT 2-5cm (47764)   Additional orders: N/A  -Admit type: OUTpatient  -Anesthesia: General  -VTE Prophylaxis Standing Order SCD's       Other:   -Standing Lab Orders Per Anesthesia    Lab other: UA&Urine Culture sent 8/5  -Standing Test orders EKG/Chest x-ray per Anesthesia       Test other:   - Medications:  Ancef  2gm IV  -Other orders:  N/A

## 2024-08-01 NOTE — Progress Notes (Signed)
   08/01/2024 11:20 AM   [name] March 03, 1946 [id number]  Reason for visit: Follow up bladder tumor pathology  HPI: Comorbid 78 year old male with history of urothelial cell carcinoma of the right kidney treated with neoadjuvant chemotherapy and nephro ureterectomy by Dr. Ike in 2006, also had bladder cancer at that time treated with TURBT.  This was followed by BCG.  He had some worsening health issues, a fall, and hospitalization in the spring 2025, and CT showed a 2 cm bladder lesion which was confirmed on cystoscopy.  He underwent TURBT on 07/21/2024 of a single 2 cm papillary lesion at the posterior wall of the bladder, procedure slightly challenging secondary to location and patient's body habitus.  Pathology showed high-grade urothelial cell carcinoma with focal invasion of muscularis propria.  I had a long conversation with patient and his wife about his diagnosis of recurrent stage II muscle invasive bladder cancer.  We discussed that typically this is treated with radical cystoprostatectomy and urinary diversion or Tri modal therapy with chemo and radiation.  They are fairly adamantly opposed to aggressive treatment at this time with his overall frailty and health issues, which I think is very reasonable.  They are more interested in a repeat second look TURBT, with the understanding that if there is residual muscle invasive disease, may need to consider other treatment options like radiation or Tri modal therapy.  If no additional disease found or stage I, could certainly consider BCG, though not standard of care.  Using shared decision making, they were interested in a second look TURBT.  They understand this is not the guideline based care based on his muscle invasive disease, but with his other comorbidities defer more invasive treatments at this time.  We discussed the risks of developing metastatic disease or progression based on this treatment strategy   [name],  MD  [facility] [address] [place], [place] [address] [phone]

## 2024-08-02 LAB — URINE CULTURE: Culture: <10000 — AB

## 2024-08-03 DIAGNOSIS — T1591XA Foreign body on external eye, part unspecified, right eye, initial encounter: Secondary | ICD-10-CM | POA: Diagnosis not present

## 2024-08-07 ENCOUNTER — Telehealth: Payer: Self-pay

## 2024-08-07 DIAGNOSIS — Z85828 Personal history of other malignant neoplasm of skin: Secondary | ICD-10-CM | POA: Diagnosis not present

## 2024-08-07 DIAGNOSIS — Z08 Encounter for follow-up examination after completed treatment for malignant neoplasm: Secondary | ICD-10-CM | POA: Diagnosis not present

## 2024-08-07 NOTE — Telephone Encounter (Signed)
 Per Dr. Francisca, Patient is to be scheduled for Transurethral Resection of Bladder Tumor   [name] and his wife were contacted and possible surgical dates were discussed, Monday September 8th, 2025 was agreed upon for surgery.   Patient was directed to call [phone] between 1-3pm the day before surgery to find out surgical arrival time.  Instructions were given not to eat or drink from midnight on the night before surgery and have a driver for the day of surgery. On the surgery day patient was instructed to enter through the [facility] entrance of [facility] report the Same Day Surgery desk.   Pre-Admit Testing will be in contact via phone to set up an interview with the anesthesia team to review your history and medications prior to surgery.   Reminder of this information was sent via Mail to the patient.

## 2024-08-07 NOTE — Progress Notes (Signed)
    [facility] Surgical Posting Form  Surgery Date: Date: 09/04/2024  Surgeon: Dr. [name], MD  Inpt ( No  )   Outpt (Yes)   Obs ( No  )   Diagnosis: D49.4 Bladder Tumor  -CPT: 47764  Surgery: Transurethral Resection of Bladder Tumor  Stop Anticoagulations: Yes and also hold ASA  Cardiac/Medical/Pulmonary Clearance needed: no  *Orders entered into EPIC  Date: 08/07/24   *Case booked in [place]  Date: 08/07/24  *Notified pt of Surgery: Date: 08/07/24  PRE-OP UA & CX: no  *Placed into Prior Authorization Work [place] Date: 08/07/24  Assistant/laser/rep:No

## 2024-08-10 ENCOUNTER — Ambulatory Visit
Admission: RE | Admit: 2024-08-10 | Discharge: 2024-08-10 | Disposition: A | Discharge status: Home / Self Care | Source: Ambulatory Visit | Attending: Family Medicine | Admitting: Family Medicine

## 2024-08-10 DIAGNOSIS — M5412 Radiculopathy, cervical region: Secondary | ICD-10-CM

## 2024-08-10 DIAGNOSIS — M50223 Other cervical disc displacement at C6-C7 level: Secondary | ICD-10-CM | POA: Diagnosis not present

## 2024-08-10 DIAGNOSIS — M47812 Spondylosis without myelopathy or radiculopathy, cervical region: Secondary | ICD-10-CM | POA: Diagnosis not present

## 2024-08-14 ENCOUNTER — Encounter: Payer: Self-pay | Admitting: Internal Medicine

## 2024-08-14 ENCOUNTER — Ambulatory Visit: Payer: Self-pay | Admitting: Internal Medicine

## 2024-08-14 ENCOUNTER — Ambulatory Visit (INDEPENDENT_AMBULATORY_CARE_PROVIDER_SITE_OTHER): Admitting: Internal Medicine

## 2024-08-14 VITALS — BP 122/80 | HR 66 | Ht 72.0 in | Wt 216.0 lb

## 2024-08-14 DIAGNOSIS — E1159 Type 2 diabetes mellitus with other circulatory complications: Secondary | ICD-10-CM | POA: Diagnosis not present

## 2024-08-14 DIAGNOSIS — E1169 Type 2 diabetes mellitus with other specified complication: Secondary | ICD-10-CM | POA: Diagnosis not present

## 2024-08-14 DIAGNOSIS — E782 Mixed hyperlipidemia: Secondary | ICD-10-CM

## 2024-08-14 DIAGNOSIS — E1165 Type 2 diabetes mellitus with hyperglycemia: Secondary | ICD-10-CM | POA: Diagnosis not present

## 2024-08-14 DIAGNOSIS — I152 Hypertension secondary to endocrine disorders: Secondary | ICD-10-CM | POA: Diagnosis not present

## 2024-08-14 DIAGNOSIS — T148XXA Other injury of unspecified body region, initial encounter: Secondary | ICD-10-CM

## 2024-08-14 DIAGNOSIS — R319 Hematuria, unspecified: Secondary | ICD-10-CM | POA: Diagnosis not present

## 2024-08-14 LAB — POCT CBG (FASTING - GLUCOSE)-MANUAL ENTRY: Glucose Fasting, POC: 176 mg/dL — AB (ref 70–99)

## 2024-08-14 MED ORDER — DICLOFENAC SODIUM 1 % EX GEL: 2.0000 g | Freq: Two times a day (BID) | CUTANEOUS | 0 refills | Status: DC

## 2024-08-14 MED ORDER — BACLOFEN 10 MG PO TABS: 10.0000 mg | ORAL_TABLET | Freq: Every day | ORAL | 1 refills | Status: DC

## 2024-08-14 NOTE — Progress Notes (Signed)
 Established Patient Office Visit  Subjective:  Patient ID: [name], male    DOB: June 26, 1946  Age: 78 y.o. MRN: [id number]  Chief Complaint  Patient presents with   Follow-up    6 week follow up    Patient comes in for follow up. Generally feels well. Getting ready for bladder tumor surgery.c/o left upper shoulder and neck discomfort- MRI C- spine is unremarkable. Advised Voltaren  gel and baclofen . Reports of frequently high blood glucose readings- currently on Novolin mix 70/30- 35 units bid, increase to 37 units bid. Will further adjust after lab results. Denies chest pain ,no shortness of breath.    No other concerns at this time.   Past Medical History:  Diagnosis Date   Abdominal wall hernia    a.) large; contained portions of the liver, RIGHT colon, and mid-distal small bowel   Aortic atherosclerosis (HCC)    Arthritis    Basal cell carcinoma of skin    Bifascicular block (RBBB + LPFB)    Bilateral carotid artery stenosis    a.) s/p PTA 08/26/2021 - 10 x 8 x 40 Exact stent RICA   Bladder tumor    BPH (benign prostatic hyperplasia)    Cerebral microvascular disease    Cervical spondylosis    Chronic midline thoracic back pain    Coronary artery disease    Diabetic polyneuropathy (HCC)    Diverticulosis    HFrEF (heart failure with reduced ejection fraction) (HCC)    History of 2019 novel coronavirus disease (COVID-19) 09/10/2020   a.) Tx'd with casirivimab/irdevimab infusion on 09/11/2020   History of kidney stones    HOH (hard of hearing)    Hypertension    Long-term use of aspirin  therapy    Lumbago of lumbar region with sciatica    Obesity    Pulmonary emphysema (HCC)    Renal cell cancer (HCC)    a.) s/p RIGHT nephrectomy   Spondylosis of thoracolumbar spine    T2DM (type 2 diabetes mellitus) (HCC)    Throat cancer (HCC)    Tremor     Past Surgical History:  Procedure Laterality Date   APPENDECTOMY     BACK SURGERY     BLADDER INSTILLATION  N/A 07/21/2024   Procedure: INSTILLATION, BLADDER;  Surgeon: Francisca Redell BROCKS, MD;  Location: [name] OR[facility];  Service: Urology;  Laterality: N/A;  GEMCITABINE    BLADDER SURGERY     CAROTID PTA/STENT INTERVENTION Right 08/26/2021   Procedure: CAROTID PTA/STENT INTERVENTION;  Surgeon: Jama Cordella KANDICE, MD;  Location: [name];  Service: Cardiovascular;  Laterality: Right;   CATARACT EXTRACTION W/PHACO Right 01/21/2023   Procedure: CATARACT EXTRACTION PHACO AND INTRAOCULAR LENS PLACEMENT (IOC) RIGHT DIABETIC  4.89  00:45.6;  Surgeon: Enola Feliciano Hugger, MD;  Location: [facility];  Service: Ophthalmology;  Laterality: Right;  Diabetic   CERVICAL SPINE SURGERY     CHOLECYSTECTOMY N/A 03/23/2018   Procedure: LAPAROSCOPIC CHOLECYSTECTOMY;  Surgeon: Rodolph Romano, MD;  Location: [name];  Service: General;  Laterality: N/A;   COLONOSCOPY     COLONOSCOPY WITH PROPOFOL  N/A 11/10/2017   Procedure: COLONOSCOPY WITH PROPOFOL ;  Surgeon: Toledo, Ladell POUR, MD;  Location: [name];  Service: Gastroenterology;  Laterality: N/A;   CYSTOSCOPY W/ RETROGRADES Bilateral 07/21/2024   Procedure: CYSTOSCOPY, WITH RETROGRADE PYELOGRAM;  Surgeon: Francisca Redell BROCKS, MD;  Location: [name];  Service: Urology;  Laterality: Bilateral;   CYSTOSCOPY WITH BIOPSY N/A 07/21/2024   Procedure: CYSTOSCOPY, WITH BIOPSY;  Surgeon: Francisca Redell  C, MD;  Location: [name];  Service: Urology;  Laterality: N/A;   ESOPHAGOGASTRODUODENOSCOPY (EGD) WITH PROPOFOL  N/A 11/10/2017   Procedure: ESOPHAGOGASTRODUODENOSCOPY (EGD) WITH PROPOFOL ;  Surgeon: Toledo, Ladell POUR, MD;  Location: [name];  Service: Gastroenterology;  Laterality: N/A;   HERNIA REPAIR     left od surgery     MICROLARYNGOSCOPY N/A 11/05/2020   Procedure: MICROLARYNGOSCOPY WITH BIOPSIES;  Surgeon: Blair Mt, MD;  Location: [facility];  Service: ENT;  Laterality: N/A;   NEPHRECTOMY     right kidney nephrectomy   rt elbow  surgery     THROAT SURGERY  1990   for cancer removal at [facility]   TRANSURETHRAL RESECTION OF BLADDER TUMOR  07/21/2024   Procedure: TURBT (TRANSURETHRAL RESECTION OF BLADDER TUMOR);  Surgeon: Francisca Redell BROCKS, MD;  Location: [name];  Service: Urology;;    Social History   Socioeconomic History   Marital status: Married    Spouse name: Almeda,[name] (Spouse)   Number of children: Not on file   Years of education: Not on file   Highest education level: Not on file  Occupational History   Not on file  Tobacco Use   Smoking status: Former    Current packs/day: 0.00    Types: Cigarettes    Quit date: 57    Years since quitting: 34.6   Smokeless tobacco: Never  Vaping Use   Vaping status: Never Used  Substance and Sexual Activity   Alcohol use: Not Currently    Comment: Occasional beer drinker   Drug use: No   Sexual activity: Yes  Other Topics Concern   Not on file  Social History Narrative   Lives at home with wife, active and independent at baseline   Social Drivers of Health   Financial Resource Strain: Low Risk  (07/05/2024)   Received from [facility]   Overall Financial Resource Strain (CARDIA)    Difficulty of Paying Living Expenses: Not hard at all  Food Insecurity: No Food Insecurity (07/05/2024)   Received from [facility]   Hunger Vital Sign    Within the past 12 months, you worried that your food would run out before you got the money to buy more.: Never true    Within the past 12 months, the food you bought just didn't last and you didn't have money to get more.: Never true  Transportation Needs: No Transportation Needs (07/05/2024)   Received from [facility] - Transportation    In the past 12 months, has lack of transportation kept you from medical appointments or from getting medications?: No    Lack of Transportation (Non-Medical): No  Physical Activity: Not on file  Stress: Not on  file  Social Connections: Socially Integrated (04/26/2024)   Social Connection and Isolation Panel    Frequency of Communication with Friends and Family: More than three times a week    Frequency of Social Gatherings with Friends and Family: Twice a week    Attends Religious Services: More than 4 times per year    Active Member of [employer] or Organizations: Yes    Attends Engineer, structural: More than 4 times per year    Marital Status: Married  Catering manager Violence: Not At Risk (04/26/2024)   Humiliation, Afraid, Rape, and Kick questionnaire    Fear of Current or Ex-Partner: No    Emotionally Abused: No    Physically Abused: No    Sexually  Abused: No    Family History  Problem Relation Age of Onset   Diabetes Brother    Hypertension Brother    CAD Brother    CAD Brother     Allergies  Allergen Reactions   Metformin Diarrhea    Outpatient Medications Prior to Visit  Medication Sig   acetaminophen  (TYLENOL ) 500 MG tablet Take 500 mg by mouth every 6 (six) hours as needed for moderate pain (pain score 4-6).   amitriptyline  (ELAVIL ) 25 MG tablet TAKE 2 TABLETS BY MOUTH AT BEDTIME   ascorbic acid (VITAMIN C) 500 MG tablet Take 500 mg by mouth daily.   aspirin  EC 81 MG tablet Take 81 mg by mouth daily. Swallow whole.   benzonatate (TESSALON) 100 MG capsule Take 1 capsule by mouth three times daily as needed for cough   carvedilol  (COREG ) 6.25 MG tablet Take 1 tablet (6.25 mg total) by mouth 2 (two) times daily.   cetirizine  (ZYRTEC  ALLERGY) 10 MG tablet Take 1 tablet (10 mg total) by mouth daily.   chlorthalidone  (HYGROTON ) 25 MG tablet Take 1 tablet by mouth once daily   Cholecalciferol  50 MCG (2000 UT) CAPS Take 2,000 Units by mouth daily.   cyanocobalamin  (VITAMIN B12) 500 MCG tablet Take 500 mcg by mouth daily.   empagliflozin  (JARDIANCE ) 25 MG TABS tablet Take 25 mg by mouth daily.   fluticasone  (FLONASE ) 50 MCG/ACT nasal spray Use 2 spray(s) in each nostril once  daily (Patient taking differently: Place 2 sprays into both nostrils daily as needed for allergies.)   glimepiride  (AMARYL ) 2 MG tablet Take 1 tablet by mouth twice daily   insulin  NPH-regular Human (NOVOLIN 70/30) (70-30) 100 UNIT/ML injection Inject 25-40 Units into the skin 2 (two) times daily with a meal.   lisinopril  (ZESTRIL ) 5 MG tablet Take 1 tablet (5 mg total) by mouth daily.   ONETOUCH ULTRA TEST test strip USE TO CHECK BLOOD SUGAR THREE TIMES DAILY   primidone  (MYSOLINE ) 50 MG tablet TAKE 1 TABLET BY MOUTH AT BEDTIME   rosuvastatin  (CRESTOR ) 40 MG tablet TAKE 1 TABLET BY MOUTH ONCE DAILY. REPLACES ATORVASTATIN   tamsulosin  (FLOMAX ) 0.4 MG CAPS capsule Take 0.4 mg by mouth daily.   Facility-Administered Medications Prior to Visit  Medication Dose Route Frequency Provider   technetium sestamibi generic (CARDIOLITE ) injection 10 millicurie  10 millicurie Intravenous Once PRN Fernand Denyse LABOR, MD    Review of Systems  Constitutional: Negative.  Negative for chills, diaphoresis, fever, malaise/fatigue and weight loss.  HENT: Negative.  Negative for congestion, ear discharge and sore throat.   Eyes: Negative.   Respiratory: Negative.  Negative for cough and shortness of breath.   Cardiovascular: Negative.  Negative for chest pain, palpitations and leg swelling.  Gastrointestinal: Negative.  Negative for abdominal pain, constipation, diarrhea, heartburn, nausea and vomiting.  Genitourinary: Negative.  Negative for dysuria and flank pain.  Musculoskeletal: Negative.  Negative for joint pain and myalgias.  Skin: Negative.   Neurological: Negative.  Negative for dizziness and headaches.  Endo/Heme/Allergies: Negative.   Psychiatric/Behavioral: Negative.  Negative for depression and suicidal ideas. The patient is not nervous/anxious.        Objective:   BP 122/80   Pulse 66   Ht 6' (1.829 m)   Wt 216 lb (98 kg)   SpO2 98%   BMI 29.29 kg/m   Vitals:   08/14/24 1034  BP:  122/80  Pulse: 66  Height: 6' (1.829 m)  Weight: 216 lb (98 kg)  SpO2: 98%  BMI (Calculated): 29.29    Physical Exam Vitals and nursing note reviewed.  Constitutional:      General: He is not in acute distress.    Appearance: Normal appearance.  HENT:     Head: Normocephalic and atraumatic.     Nose: Nose normal.     Mouth/Throat:     Mouth: Mucous membranes are moist.     Pharynx: Oropharynx is clear.  Eyes:     Conjunctiva/sclera: Conjunctivae normal.     Pupils: Pupils are equal, round, and reactive to light.  Cardiovascular:     Rate and Rhythm: Normal rate and regular rhythm.     Pulses: Normal pulses.     Heart sounds: Normal heart sounds.  Pulmonary:     Effort: Pulmonary effort is normal.     Breath sounds: Normal breath sounds. No wheezing, rhonchi or rales.  Abdominal:     General: Bowel sounds are normal.     Palpations: Abdomen is soft. There is no mass.     Tenderness: There is no abdominal tenderness. There is no right CVA tenderness, left CVA tenderness, guarding or rebound.     Hernia: No hernia is present.  Musculoskeletal:        General: Normal range of motion.     Cervical back: Normal range of motion.     Right lower leg: No edema.     Left lower leg: No edema.  Skin:    General: Skin is warm and dry.     Findings: No rash.  Neurological:     General: No focal deficit present.     Mental Status: He is alert and oriented to person, place, and time.  Psychiatric:        Mood and Affect: Mood normal.        Behavior: Behavior normal.        Judgment: Judgment normal.      Results for orders placed or performed in visit on 08/14/24  POCT CBG (Fasting - Glucose)  Result Value Ref Range   Glucose Fasting, POC 176 (A) 70 - 99 mg/dL    Recent Results (from the past 2160 hours)  POCT CBG (Fasting - Glucose)     Status: Abnormal   Collection Time: 06/05/24 11:14 AM  Result Value Ref Range   Glucose Fasting, POC 137 (A) 70 - 99 mg/dL   Urinalysis, Complete w Microscopic -     Status: Abnormal   Collection Time: 06/28/24 11:20 AM  Result Value Ref Range   Color, Urine YELLOW YELLOW   APPearance CLEAR CLEAR   Specific Gravity, Urine 1.020 1.005 - 1.030   pH 7.0 5.0 - 8.0   Glucose, UA >=500 (A) NEGATIVE mg/dL   Hgb urine dipstick MODERATE (A) NEGATIVE   Bilirubin Urine NEGATIVE NEGATIVE   Ketones, ur NEGATIVE NEGATIVE mg/dL   Protein, ur NEGATIVE NEGATIVE mg/dL   Nitrite NEGATIVE NEGATIVE   Leukocytes,Ua NEGATIVE NEGATIVE   Squamous Epithelial / HPF 0-5 0 - 5 /HPF   WBC, UA 0-5 0 - 5 WBC/hpf   RBC / HPF >50 0 - 5 RBC/hpf   Bacteria, UA NONE SEEN NONE SEEN    Comment: Performed at [facility], [address] [place], [place] [address]  Urine Culture     Status: None   Collection Time: 06/28/24 11:20 AM   Specimen: Urine, Random  Result Value Ref Range   Specimen Description      URINE, RANDOM Performed  at [facility], [address] [place], [place] [address]    Special Requests      NONE Performed at [facility], [address], [place], [place] [address]    Culture      NO GROWTH Performed at [facility], [address] [place], [place] [address]    Report Status 06/29/2024 FINAL   Surgical pathology     Status: None   Collection Time: 07/21/24 12:00 AM  Result Value Ref Range   SURGICAL PATHOLOGY      SURGICAL PATHOLOGY [facility] [address] [place], [place] [address] Telephone [phone] or [phone] Fax [phone]  REPORT OF SURGICAL PATHOLOGY   Accession #: [id number] Patient Name: YESHAYA, VATH Visit # : [id number]  MRN: [id number] Physician: Francisca Rogue DOB/Age December 26, 1946 (Age: 19) Gender: M Collected Date: 07/21/2024 Received Date: 07/21/2024  FINAL DIAGNOSIS       1. Bladder, transurethral resection, bladder tumor :       HIGH GRADE PAPILLARY UROTHELIAL CARCINOMA      THE  CARCINOMA FOCALLY INVADES MUSCULARIS PROPRIA (DETRUSOR MUSCLE)       DATE SIGNED OUT: 07/24/2024 ELECTRONIC SIGNATURE : Stuart Come, Zhaoli, Pathologist, Electronic Signature  MICROSCOPIC DESCRIPTION  CASE COMMENTS STAINS USED IN DIAGNOSIS: H&E H&E    CLINICAL HISTORY  SPECIMEN(S) OBTAINED 1. Bladder, transurethral resection, Bladder Tumor  SPECIMEN COMMENTS: SPECIMEN CLINICAL INFORMATION: 1. Bladder tumor, cytoscopy wi th bladder biopsy, retrograde pyelogram and bladder instillation of gemcitabine     Gross Description 1. Received fresh is a 2 x 1.4 x 0.5 cm aggregate of pink red soft tissue, entirely submitted in two blocks.   (SSW:kh 07/21/24)        Report signed out from the following location(s) [place] [address] [place], [place] [address] CLIA #: [id number]  [facility] [address] [place], [place] [address] CLIA #: [id number]   Glucose, capillary     Status: Abnormal   Collection Time: 07/21/24  9:04 AM  Result Value Ref Range   Glucose-Capillary 171 (H) 70 - 99 mg/dL    Comment: Glucose reference range applies only to samples taken after fasting for at least 8 hours.  Glucose, capillary     Status: Abnormal   Collection Time: 07/21/24 10:46 AM  Result Value Ref Range   Glucose-Capillary 150 (H) 70 - 99 mg/dL    Comment: Glucose reference range applies only to samples taken after fasting for at least 8 hours.  Urine Culture     Status: Abnormal   Collection Time: 08/01/24 10:41 AM   Specimen: Urine, Random  Result Value Ref Range   Specimen Description      URINE, RANDOM Performed at [facility], [address], [place], [place] [address]    Special Requests      NONE Performed at [facility], [address], [place], [place] [address]    Culture (A)     <10,000 COLONIES/mL INSIGNIFICANT GROWTH Performed at [facility], [address], [place], [place] [address]     Report Status 08/02/2024 FINAL   Urinalysis, Complete w Microscopic -     Status: Abnormal   Collection Time: 08/01/24 10:41 AM  Result Value Ref Range   Color, Urine YELLOW YELLOW   APPearance CLEAR CLEAR   Specific Gravity, Urine 1.020 1.005 - 1.030   pH 7.5 5.0 - 8.0  Glucose, UA 500 (A) NEGATIVE mg/dL   Hgb urine dipstick SMALL (A) NEGATIVE   Bilirubin Urine NEGATIVE NEGATIVE   Ketones, ur NEGATIVE NEGATIVE mg/dL   Protein, ur NEGATIVE NEGATIVE mg/dL   Nitrite NEGATIVE NEGATIVE   Leukocytes,Ua NEGATIVE NEGATIVE   Squamous Epithelial / HPF NONE SEEN 0 - 5 /HPF   WBC, UA NONE SEEN 0 - 5 WBC/hpf   RBC / HPF 0-5 0 - 5 RBC/hpf   Bacteria, UA RARE (A) NONE SEEN    Comment: Performed at [facility], [address] [place], [place] [address]  POCT CBG (Fasting - Glucose)     Status: Abnormal   Collection Time: 08/14/24 10:39 AM  Result Value Ref Range   Glucose Fasting, POC 176 (A) 70 - 99 mg/dL      Assessment & Plan:  Continue current meds. Increase Novolin 70/30, to 37 units bid, strict diet control. Check labs today. Problem List Items Addressed This Visit     Type 2 diabetes mellitus with hyperglycemia, without long-term current use of insulin  (HCC) - Primary   Relevant Orders   POCT CBG (Fasting - Glucose) (Completed)   Hemoglobin A1c   Hypertension associated with diabetes (HCC)   Relevant Orders   CMP14+EGFR   Combined hyperlipidemia associated with type 2 diabetes mellitus (HCC)   Relevant Orders   Lipid Panel w/o Chol/HDL Ratio   Other Visit Diagnoses       Muscle strain       Relevant Medications   diclofenac  Sodium (VOLTAREN ) 1 % GEL     Hematuria, unspecified type       Relevant Orders   CBC with Diff       Return in about 3 months (around 11/14/2024).   Total time spent: 30 minutes  FERNAND FREDY RAMAN, MD  08/14/2024   This document may have been prepared by [facility] Voice Recognition software and as such may include unintentional  dictation errors.

## 2024-08-15 ENCOUNTER — Telehealth: Payer: Self-pay

## 2024-08-15 NOTE — Progress Notes (Signed)
   08/15/2024  Patient ID: [name], male   DOB: Jun 18, 1946, 78 y.o.   MRN: [id number]  Pharmacy Quality Measure Review  This patient is appearing on a report for being at risk of failing the adherence measure for cholesterol (statin), diabetes, and hypertension (ACEi/ARB) medications this calendar year.   Medication: Lisinopril  Last fill date: 06/20/24 for 90 day supply  Medication: Jardiance  Last fill date: 05/01/24 for 90 day supply  Medication: Glimepiride  Last fill date: 07/03/24 for 90 day supply  Medication: Rosuvastatin  Last fill date: 05/14/24 for 90 day supply  Left voicemail for patient to return my call at their convenience. Past due to refill Jardiance  and Rosuvastatin . Time to refill lisinopril .  Jon VEAR Lindau, PharmD Clinical Pharmacist [phone]

## 2024-08-16 ENCOUNTER — Telehealth: Payer: Self-pay

## 2024-08-16 NOTE — Progress Notes (Signed)
   08/16/2024  Patient ID: [name], male   DOB: [birth year], 78 y.o.   MRN: [id number]  Pharmacy Quality Measure Review  This patient is appearing on a report for being at risk of failing the adherence measure for cholesterol (statin), diabetes, and hypertension (ACEi/ARB) medications this calendar year.   Medication: Lisinopril  Last fill date: 06/20/24 for 90 day supply  Medication: Jardiance  Last fill date: 05/01/24 for 90 day supply  Medication: Glimepiride  Last fill date: 07/03/24 for 90 day supply  Medication: Rosuvastatin  Last fill date: 08/11/24 for 90 day supply  Patient returned by call. All medications on auto-refill. Still has some jardiance  on hand from having to hold earlier in the year for a procedure. Patient prefers to contact [facility] when he gets closer to being out. No further action needed at this time.  Jon VEAR Lindau, PharmD Clinical Pharmacist [phone]

## 2024-08-17 ENCOUNTER — Other Ambulatory Visit: Payer: Self-pay | Admitting: Family

## 2024-08-17 LAB — CBC WITH DIFFERENTIAL/PLATELET
Basophils Absolute: 0.1 x10E3/uL (ref 0.0–0.2)
Basos: 1 %
EOS (ABSOLUTE): 0.5 x10E3/uL — ABNORMAL HIGH (ref 0.0–0.4)
Eos: 6 %
Hematocrit: 45.7 % (ref 37.5–51.0)
Hemoglobin: 13.9 g/dL (ref 13.0–17.7)
Immature Grans (Abs): 0 x10E3/uL (ref 0.0–0.1)
Immature Granulocytes: 0 %
Lymphocytes Absolute: 1.3 x10E3/uL (ref 0.7–3.1)
Lymphs: 15 %
MCH: 25.6 pg — ABNORMAL LOW (ref 26.6–33.0)
MCHC: 30.4 g/dL — ABNORMAL LOW (ref 31.5–35.7)
MCV: 84 fL (ref 79–97)
Monocytes Absolute: 0.7 x10E3/uL (ref 0.1–0.9)
Monocytes: 8 %
Neutrophils Absolute: 5.9 x10E3/uL (ref 1.4–7.0)
Neutrophils: 69 %
Platelets: 166 x10E3/uL (ref 150–450)
RBC: 5.44 x10E6/uL (ref 4.14–5.80)
RDW: 15.5 % — ABNORMAL HIGH (ref 11.6–15.4)
WBC: 8.4 x10E3/uL (ref 3.4–10.8)

## 2024-08-17 LAB — LIPID PANEL W/O CHOL/HDL RATIO

## 2024-08-17 LAB — CMP14+EGFR

## 2024-08-17 LAB — HEMOGLOBIN A1C
Est. average glucose Bld gHb Est-mCnc: 157 mg/dL
Hgb A1c MFr Bld: 7.1 % — ABNORMAL HIGH (ref 4.8–5.6)

## 2024-08-18 NOTE — Progress Notes (Signed)
 Patient notified

## 2024-08-22 ENCOUNTER — Other Ambulatory Visit

## 2024-08-22 DIAGNOSIS — E1165 Type 2 diabetes mellitus with hyperglycemia: Secondary | ICD-10-CM

## 2024-08-22 DIAGNOSIS — E1159 Type 2 diabetes mellitus with other circulatory complications: Secondary | ICD-10-CM | POA: Diagnosis not present

## 2024-08-22 DIAGNOSIS — E782 Mixed hyperlipidemia: Secondary | ICD-10-CM

## 2024-08-22 DIAGNOSIS — I152 Hypertension secondary to endocrine disorders: Secondary | ICD-10-CM | POA: Diagnosis not present

## 2024-08-23 LAB — CMP14+EGFR
ALT: 13 IU/L (ref 0–44)
AST: 16 IU/L (ref 0–40)
Albumin: 4.1 g/dL (ref 3.8–4.8)
Alkaline Phosphatase: 65 IU/L (ref 44–121)
BUN/Creatinine Ratio: 12 (ref 10–24)
BUN: 14 mg/dL (ref 8–27)
Bilirubin Total: 0.5 mg/dL (ref 0.0–1.2)
CO2: 23 mmol/L (ref 20–29)
Calcium: 10.8 mg/dL — ABNORMAL HIGH (ref 8.6–10.2)
Chloride: 104 mmol/L (ref 96–106)
Creatinine, Ser: 1.18 mg/dL (ref 0.76–1.27)
Globulin, Total: 2.4 g/dL (ref 1.5–4.5)
Glucose: 148 mg/dL — ABNORMAL HIGH (ref 70–99)
Potassium: 3.8 mmol/L (ref 3.5–5.2)
Sodium: 142 mmol/L (ref 134–144)
Total Protein: 6.5 g/dL (ref 6.0–8.5)
eGFR: 64 mL/min/1.73 (ref >59)

## 2024-08-23 LAB — LIPID PANEL
Chol/HDL Ratio: 3 ratio (ref 0.0–5.0)
Cholesterol, Total: 127 mg/dL (ref 100–199)
HDL: 43 mg/dL (ref >39)
LDL Chol Calc (NIH): 62 mg/dL (ref 0–99)
Triglycerides: 121 mg/dL (ref 0–149)
VLDL Cholesterol Cal: 22 mg/dL (ref 5–40)

## 2024-08-23 LAB — HEMOGLOBIN A1C
Est. average glucose Bld gHb Est-mCnc: 151 mg/dL
Hgb A1c MFr Bld: 6.9 % — ABNORMAL HIGH (ref 4.8–5.6)

## 2024-08-23 LAB — TSH: TSH: 1.31 u[IU]/mL (ref 0.450–4.500)

## 2024-08-24 ENCOUNTER — Ambulatory Visit: Admitting: Cardiovascular Disease

## 2024-08-24 ENCOUNTER — Ambulatory Visit: Payer: Self-pay | Admitting: Internal Medicine

## 2024-08-25 NOTE — Progress Notes (Signed)
 Patient wife informed

## 2024-08-29 ENCOUNTER — Encounter
Admission: RE | Admit: 2024-08-29 | Discharge: 2024-08-29 | Disposition: A | Discharge status: Home / Self Care | Source: Ambulatory Visit | Attending: Urology | Admitting: Urology

## 2024-08-29 DIAGNOSIS — E1165 Type 2 diabetes mellitus with hyperglycemia: Secondary | ICD-10-CM

## 2024-08-29 NOTE — Patient Instructions (Addendum)
 Your procedure is scheduled on: 09/04/24 - Monday Report to the Registration Desk on the 1st floor of the [facility]. To find out your arrival time, please call [phone] between 1PM - 3PM on: 09/01/24 - Friday If your arrival time is 6:00 am, do not arrive before that time as the [facility] entrance doors do not open until 6:00 am.  REMEMBER: Instructions that are not followed completely may result in serious medical risk, up to and including death; or upon the discretion of your surgeon and anesthesiologist your surgery may need to be rescheduled.  Do not eat food or drink any liquids after midnight the night before surgery.  No gum chewing or hard candies.  One week prior to surgery: Stop Anti-inflammatories (NSAIDS) such as Advil, Aleve, Ibuprofen, Motrin, Naproxen, Naprosyn and Aspirin  based products such as Excedrin, Goody's Powder, BC Powder.You may continue to take Tylenol  if needed for pain up until the day of surgery.  Stop ANY OVER THE COUNTER supplements until after surgery : Cholecalciferol .   HOLD empagliflozin  (JARDIANCE ) beginning 09/05, may resume after surgery.   HOLD insulin  NPH-regular Human (NOVOLIN 70/30) on the morning of surgery, may resume with meals.   ON THE DAY OF SURGERY ONLY TAKE THESE MEDICATIONS WITH SIPS OF WATER :   carvedilol  (COREG )   No Alcohol for 24 hours before or after surgery.  No Smoking including e-cigarettes for 24 hours before surgery.  No chewable tobacco products for at least 6 hours before surgery.  No nicotine patches on the day of surgery.  Do not use any recreational drugs for at least a week (preferably 2 weeks) before your surgery.  Please be advised that the combination of cocaine and anesthesia may have negative outcomes, up to and including death. If you test positive for cocaine, your surgery will be cancelled.  On the morning of surgery brush your teeth with toothpaste and water , you may rinse your mouth with  mouthwash if you wish. Do not swallow any toothpaste or mouthwash.  Use CHG Soap or wipes as directed on instruction sheet.  Do not wear jewelry, make-up, hairpins, clips or nail polish.  For welded (permanent) jewelry: bracelets, anklets, waist bands, etc.  Please have this removed prior to surgery.  If it is not removed, there is a chance that hospital personnel will need to cut it off on the day of surgery.  Do not wear lotions, powders, or perfumes.   Do not shave body hair from the neck down 48 hours before surgery.  Contact lenses, hearing aids and dentures may not be worn into surgery.  Do not bring valuables to the hospital. [facility] is not responsible for any missing/lost belongings or valuables.   Notify your doctor if there is any change in your medical condition (cold, fever, infection).  Wear comfortable clothing (specific to your surgery type) to the hospital.  After surgery, you can help prevent lung complications by doing breathing exercises.  Take deep breaths and cough every 1-2 hours. Your doctor may order a device called an Incentive Spirometer to help you take deep breaths.  When coughing or sneezing, hold a pillow firmly against your incision with both hands. This is called "splinting." Doing this helps protect your incision. It also decreases belly discomfort.  If you are being admitted to the hospital overnight, leave your suitcase in the car. After surgery it may be brought to your room.  In case of increased patient census, it may be necessary for you, the  patient, to continue your postoperative care in the Same Day Surgery department.  If you are being discharged the day of surgery, you will not be allowed to drive home. You will need a responsible individual to drive you home and stay with you for 24 hours after surgery.   If you are taking public transportation, you will need to have a responsible individual with you.  Please call the Pre-admissions  Testing Dept. at [phone] if you have any questions about these instructions.  Surgery Visitation Policy:  Patients having surgery or a procedure may have two visitors.  Children under the age of 11 must have an adult with them who is not the patient.  Inpatient Visitation:    Visiting hours are 7 a.m. to 8 p.m. Up to four visitors are allowed at one time in a patient room. The visitors may rotate out with other people during the day.  One visitor age 36 or older may stay with the patient overnight and must be in the room by 8 p.m.   Merchandiser, retail to address health-related social needs:  [email or web address]

## 2024-09-04 ENCOUNTER — Ambulatory Visit
Admission: RE | Admit: 2024-09-04 | Discharge: 2024-09-04 | Disposition: A | Discharge status: Home / Self Care | Attending: Urology | Admitting: Urology

## 2024-09-04 ENCOUNTER — Encounter: Payer: Self-pay | Admitting: Urology

## 2024-09-04 ENCOUNTER — Other Ambulatory Visit: Payer: Self-pay

## 2024-09-04 ENCOUNTER — Ambulatory Visit: Payer: Self-pay | Admitting: Certified Registered"

## 2024-09-04 ENCOUNTER — Encounter
Admission: RE | Disposition: A | Discharge status: Home / Self Care | Payer: Self-pay | Source: Home / Self Care | Attending: Urology

## 2024-09-04 ENCOUNTER — Ambulatory Visit: Payer: Self-pay | Admitting: Urgent Care

## 2024-09-04 DIAGNOSIS — J439 Emphysema, unspecified: Secondary | ICD-10-CM | POA: Diagnosis not present

## 2024-09-04 DIAGNOSIS — I5022 Chronic systolic (congestive) heart failure: Secondary | ICD-10-CM | POA: Insufficient documentation

## 2024-09-04 DIAGNOSIS — E669 Obesity, unspecified: Secondary | ICD-10-CM | POA: Diagnosis not present

## 2024-09-04 DIAGNOSIS — Z905 Acquired absence of kidney: Secondary | ICD-10-CM | POA: Diagnosis not present

## 2024-09-04 DIAGNOSIS — Z6828 Body mass index (BMI) 28.0-28.9, adult: Secondary | ICD-10-CM | POA: Insufficient documentation

## 2024-09-04 DIAGNOSIS — Z85528 Personal history of other malignant neoplasm of kidney: Secondary | ICD-10-CM | POA: Diagnosis not present

## 2024-09-04 DIAGNOSIS — E1165 Type 2 diabetes mellitus with hyperglycemia: Secondary | ICD-10-CM

## 2024-09-04 DIAGNOSIS — D494 Neoplasm of unspecified behavior of bladder: Secondary | ICD-10-CM

## 2024-09-04 DIAGNOSIS — E119 Type 2 diabetes mellitus without complications: Secondary | ICD-10-CM | POA: Insufficient documentation

## 2024-09-04 DIAGNOSIS — I5032 Chronic diastolic (congestive) heart failure: Secondary | ICD-10-CM | POA: Diagnosis not present

## 2024-09-04 DIAGNOSIS — Z833 Family history of diabetes mellitus: Secondary | ICD-10-CM | POA: Insufficient documentation

## 2024-09-04 DIAGNOSIS — I110 Hypertensive heart disease with heart failure: Secondary | ICD-10-CM | POA: Insufficient documentation

## 2024-09-04 DIAGNOSIS — Z87442 Personal history of urinary calculi: Secondary | ICD-10-CM | POA: Insufficient documentation

## 2024-09-04 DIAGNOSIS — Z9049 Acquired absence of other specified parts of digestive tract: Secondary | ICD-10-CM | POA: Insufficient documentation

## 2024-09-04 DIAGNOSIS — C671 Malignant neoplasm of dome of bladder: Secondary | ICD-10-CM | POA: Insufficient documentation

## 2024-09-04 DIAGNOSIS — I2510 Atherosclerotic heart disease of native coronary artery without angina pectoris: Secondary | ICD-10-CM | POA: Diagnosis not present

## 2024-09-04 DIAGNOSIS — Z87891 Personal history of nicotine dependence: Secondary | ICD-10-CM | POA: Diagnosis not present

## 2024-09-04 DIAGNOSIS — C679 Malignant neoplasm of bladder, unspecified: Secondary | ICD-10-CM | POA: Diagnosis not present

## 2024-09-04 HISTORY — PX: TRANSURETHRAL RESECTION OF BLADDER TUMOR: SHX2575

## 2024-09-04 LAB — GLUCOSE, CAPILLARY
Glucose-Capillary: 154 mg/dL — ABNORMAL HIGH (ref 70–99)
Glucose-Capillary: 143 mg/dL — ABNORMAL HIGH (ref 70–99)

## 2024-09-04 SURGERY — TURBT (TRANSURETHRAL RESECTION OF BLADDER TUMOR)
Anesthesia: General | Site: Bladder

## 2024-09-04 MED ORDER — ORAL CARE MOUTH RINSE: 15.0000 mL | Freq: Once | OROMUCOSAL | Status: DC

## 2024-09-04 MED ORDER — FENTANYL CITRATE (PF) 100 MCG/2ML IJ SOLN: 25.0000 ug | INTRAMUSCULAR | Status: DC | PRN

## 2024-09-04 MED ORDER — CEFAZOLIN SODIUM-DEXTROSE 2-4 GM/100ML-% IV SOLN
INTRAVENOUS | Status: AC
Start: 2024-09-04 — End: 2024-09-04
  Filled 2024-09-04: qty 100

## 2024-09-04 MED ORDER — DROPERIDOL 2.5 MG/ML IJ SOLN: 0.6250 mg | Freq: Once | INTRAMUSCULAR | Status: DC | PRN

## 2024-09-04 MED ORDER — ROCURONIUM BROMIDE 100 MG/10ML IV SOLN
INTRAVENOUS | Status: DC | PRN
  Administered 2024-09-04: 50 mg via INTRAVENOUS

## 2024-09-04 MED ORDER — CEFAZOLIN SODIUM-DEXTROSE 2-4 GM/100ML-% IV SOLN
2.0000 g | INTRAVENOUS | Status: AC
  Administered 2024-09-04: 2 g via INTRAVENOUS

## 2024-09-04 MED ORDER — PROPOFOL 10 MG/ML IV BOLUS
INTRAVENOUS | Status: DC | PRN
  Administered 2024-09-04: 120 mg via INTRAVENOUS

## 2024-09-04 MED ORDER — SODIUM CHLORIDE 0.9 % IR SOLN
Status: DC | PRN
  Administered 2024-09-04: 1500 mL

## 2024-09-04 MED ORDER — CHLORHEXIDINE GLUCONATE 0.12 % MT SOLN: 15.0000 mL | Freq: Once | OROMUCOSAL | Status: DC

## 2024-09-04 MED ORDER — SODIUM CHLORIDE 0.9 % IV SOLN: INTRAVENOUS | Status: DC

## 2024-09-04 MED ORDER — DEXAMETHASONE SODIUM PHOSPHATE 10 MG/ML IJ SOLN
INTRAMUSCULAR | Status: DC | PRN
  Administered 2024-09-04: 5 mg via INTRAVENOUS

## 2024-09-04 MED ORDER — OXYCODONE HCL 5 MG/5ML PO SOLN: 5.0000 mg | Freq: Once | ORAL | Status: DC | PRN

## 2024-09-04 MED ORDER — CHLORHEXIDINE GLUCONATE 0.12 % MT SOLN
OROMUCOSAL | Status: AC
  Filled 2024-09-04: qty 15

## 2024-09-04 MED ORDER — ONDANSETRON HCL 4 MG/2ML IJ SOLN
INTRAMUSCULAR | Status: DC | PRN
  Administered 2024-09-04: 4 mg via INTRAVENOUS

## 2024-09-04 MED ORDER — LIDOCAINE HCL (CARDIAC) PF 100 MG/5ML IV SOSY
PREFILLED_SYRINGE | INTRAVENOUS | Status: DC | PRN
  Administered 2024-09-04: 80 mg via INTRAVENOUS

## 2024-09-04 MED ORDER — SUGAMMADEX SODIUM 200 MG/2ML IV SOLN
INTRAVENOUS | Status: DC | PRN
  Administered 2024-09-04: 300 mg via INTRAVENOUS

## 2024-09-04 MED ORDER — ACETAMINOPHEN 10 MG/ML IV SOLN: 1000.0000 mg | Freq: Once | INTRAVENOUS | Status: DC | PRN

## 2024-09-04 MED ORDER — FENTANYL CITRATE (PF) 100 MCG/2ML IJ SOLN
INTRAMUSCULAR | Status: AC
  Filled 2024-09-04: qty 2

## 2024-09-04 MED ORDER — FENTANYL CITRATE (PF) 100 MCG/2ML IJ SOLN
INTRAMUSCULAR | Status: DC | PRN
  Administered 2024-09-04: 25 ug via INTRAVENOUS

## 2024-09-04 MED ORDER — STERILE WATER FOR IRRIGATION IR SOLN
Status: DC | PRN
  Administered 2024-09-04: 1500 mL
  Administered 2024-09-04: 1000 mL

## 2024-09-04 MED ORDER — OXYCODONE HCL 5 MG PO TABS: 5.0000 mg | ORAL_TABLET | Freq: Once | ORAL | Status: DC | PRN

## 2024-09-04 SURGICAL SUPPLY — 22 items
BAG DRAIN SIEMENS DORNER NS (MISCELLANEOUS) ×1 IMPLANT
BAG URINE DRAIN 2000ML AR STRL (UROLOGICAL SUPPLIES) ×1 IMPLANT
BRUSH SCRUB EZ 4% CHG (MISCELLANEOUS) ×1 IMPLANT
CATH FOL 2WAY LX 18X30 (CATHETERS) ×1 IMPLANT
DRAPE UTILITY 15X26 TOWEL STRL (DRAPES) ×1 IMPLANT
DRSG TELFA 3X4 N-ADH STERILE (GAUZE/BANDAGES/DRESSINGS) ×1 IMPLANT
ELECTRODE LOOP 22F BIPOLAR SML (ELECTROSURGICAL) IMPLANT
ELECTRODE REM PT RTRN 9FT ADLT (ELECTROSURGICAL) IMPLANT
GLOVE BIOGEL PI IND STRL 7.5 (GLOVE) ×1 IMPLANT
GOWN STRL REUS W/ TWL LRG LVL3 (GOWN DISPOSABLE) ×1 IMPLANT
GOWN STRL REUS W/ TWL XL LVL3 (GOWN DISPOSABLE) ×1 IMPLANT
KIT TURNOVER CYSTO (KITS) ×1 IMPLANT
LOOP CUT BIPOLAR 24F LRG (ELECTROSURGICAL) IMPLANT
NDL SAFETY ECLIPSE 18X1.5 (NEEDLE) ×1 IMPLANT
PACK CYSTO AR (MISCELLANEOUS) ×1 IMPLANT
PAD ARMBOARD POSITIONER FOAM (MISCELLANEOUS) ×1 IMPLANT
SET IRRIG Y TYPE TUR BLADDER L (SET/KITS/TRAYS/PACK) ×1 IMPLANT
SOL .9 NS 3000ML IRR UROMATIC (IV SOLUTION) ×2 IMPLANT
SURGILUBE 2OZ TUBE FLIPTOP (MISCELLANEOUS) ×1 IMPLANT
SYRINGE TOOMEY IRRIG 70ML (MISCELLANEOUS) ×1 IMPLANT
WATER STERILE IRR 1000ML POUR (IV SOLUTION) ×1 IMPLANT
WATER STERILE IRR 500ML POUR (IV SOLUTION) ×1 IMPLANT

## 2024-09-04 NOTE — Transfer of Care (Signed)
 Immediate Anesthesia Transfer of Care Note  Patient: [name]  Procedure(s) Performed: TURBT (TRANSURETHRAL RESECTION OF BLADDER TUMOR) (Bladder)  Patient Location: PACU  Anesthesia Type:General  Level of Consciousness: awake, drowsy, and patient cooperative  Airway & Oxygen Therapy: Patient Spontanous Breathing and Patient connected to face mask oxygen  Post-op Assessment: Report given to RN and Post -op Vital signs reviewed and stable  Post vital signs: Reviewed and stable  Last Vitals:  Vitals Value Taken Time  BP 182/78 09/04/24 12:37  Temp 36.7 C 09/04/24 12:36  Pulse 68 09/04/24 12:39  Resp 16 09/04/24 12:39  SpO2 100 % 09/04/24 12:39  Vitals shown include unfiled device data.  Last Pain:  Vitals:   09/04/24 1236  TempSrc:   PainSc: 0-No pain      Patients Stated Pain Goal: 0 (09/04/24 1053)  Complications: No notable events documented.

## 2024-09-04 NOTE — Anesthesia Procedure Notes (Signed)
 Procedure Name: Intubation Date/Time: 09/04/2024 12:13 PM  Performed by: Ledora Duncan, CRNAPre-anesthesia Checklist: Patient identified, Emergency Drugs available, Suction available and Patient being monitored Patient Re-evaluated:Patient Re-evaluated prior to induction Oxygen Delivery Method: Circle system utilized Preoxygenation: Pre-oxygenation with 100% oxygen Induction Type: IV induction Ventilation: Mask ventilation without difficulty Laryngoscope Size: McGrath and 3 Grade View: Grade I Tube type: Oral Tube size: 7.0 mm Number of attempts: 1 Airway Equipment and Method: Stylet and Oral airway Placement Confirmation: ETT inserted through vocal cords under direct vision, positive ETCO2 and breath sounds checked- equal and bilateral Secured at: 21 cm Tube secured with: Tape Dental Injury: Teeth and Oropharynx as per pre-operative assessment

## 2024-09-04 NOTE — Anesthesia Preprocedure Evaluation (Signed)
 Anesthesia Evaluation  Patient identified by MRN, date of birth, ID band Patient awake    Reviewed: Allergy & Precautions, H&P , NPO status , Patient's Chart, lab work & pertinent test results, reviewed documented beta blocker date and time   Airway Mallampati: II  TM Distance: >3 FB Neck ROM: full    Dental  (+) Teeth Intact   Pulmonary COPD,  COPD inhaler, former smoker   Pulmonary exam normal        Cardiovascular Exercise Tolerance: Poor hypertension, On Medications + CAD, + Peripheral Vascular Disease and +CHF  Normal cardiovascular exam+ dysrhythmias  Rhythm:regular Rate:Normal     Neuro/Psych  Neuromuscular disease  negative psych ROS   GI/Hepatic negative GI ROS, Neg liver ROS,,,  Endo/Other  negative endocrine ROSdiabetes, Well Controlled    Renal/GU Renal disease  negative genitourinary   Musculoskeletal   Abdominal   Peds  Hematology negative hematology ROS (+)   Anesthesia Other Findings Past Medical History: No date: Abdominal wall hernia     Comment:  a.) large; contained portions of the liver, RIGHT colon,              and mid-distal small bowel No date: Aortic atherosclerosis (HCC) No date: Arthritis No date: Basal cell carcinoma of skin No date: Bifascicular block (RBBB + LPFB) No date: Bilateral carotid artery stenosis     Comment:  a.) s/p PTA 08/26/2021 - 10 x 8 x 40 Exact stent RICA No date: Bladder tumor No date: BPH (benign prostatic hyperplasia) No date: Cerebral microvascular disease No date: Cervical spondylosis No date: Chronic midline thoracic back pain No date: Coronary artery disease No date: Diabetic polyneuropathy (HCC) No date: Diverticulosis No date: HFrEF (heart failure with reduced ejection fraction) (HCC) 09/10/2020: History of 2019 novel coronavirus disease (COVID-19)     Comment:  a.) Tx'd with casirivimab/irdevimab infusion on               09/11/2020 No date:  History of kidney stones No date: HOH (hard of hearing) No date: Hypertension No date: Long-term use of aspirin  therapy No date: Lumbago of lumbar region with sciatica No date: Obesity No date: Pulmonary emphysema (HCC) No date: Renal cell cancer (HCC)     Comment:  a.) s/p RIGHT nephrectomy No date: Spondylosis of thoracolumbar spine No date: T2DM (type 2 diabetes mellitus) (HCC) No date: Throat cancer (HCC) No date: Tremor Past Surgical History: No date: APPENDECTOMY No date: BACK SURGERY 07/21/2024: BLADDER INSTILLATION; N/A     Comment:  Procedure: INSTILLATION, BLADDER;  Surgeon: Francisca Redell BROCKS, MD;  Location: [name];  Service: Urology;                Laterality: N/A;  GEMCITABINE  No date: BLADDER SURGERY 08/26/2021: CAROTID PTA/STENT INTERVENTION; Right     Comment:  Procedure: CAROTID PTA/STENT INTERVENTION;  Surgeon:               Jama Cordella MATSU, MD;  Location: [name];               Service: Cardiovascular;  Laterality: Right; 01/21/2023: CATARACT EXTRACTION W/PHACO; Right     Comment:  Procedure: CATARACT EXTRACTION PHACO AND INTRAOCULAR               LENS PLACEMENT (IOC) RIGHT DIABETIC  4.89  00:45.6;  Surgeon: Enola Feliciano Hugger, MD;  Location: [facility]               SURGERY CN[facility];  Service: Ophthalmology;  Laterality:               Right;  Diabetic No date: CERVICAL SPINE SURGERY 03/23/2018: CHOLECYSTECTOMY; N/A     Comment:  Procedure: LAPAROSCOPIC CHOLECYSTECTOMY;  Surgeon:               Rodolph Romano, MD;  Location: [name];  Service:              General;  Laterality: N/A; No date: COLONOSCOPY 11/10/2017: COLONOSCOPY WITH PROPOFOL ; N/A     Comment:  Procedure: COLONOSCOPY WITH PROPOFOL ;  Surgeon: Tol[place],               Ladell POUR, MD;  Location: [name];  Service:               Gastroenterology;  Laterality: N/A; 07/21/2024: CYSTOSCOPY W/ RETROGRADES; Bilateral     Comment:  Procedure: CYSTOSCOPY,  WITH RETROGRADE PYELOGRAM;                Surgeon: Francisca Redell BROCKS, MD;  Location: [name];                Service: Urology;  Laterality: Bilateral; 07/21/2024: CYSTOSCOPY WITH BIOPSY; N/A     Comment:  Procedure: CYSTOSCOPY, WITH BIOPSY;  Surgeon: Francisca Redell BROCKS, MD;  Location: [name];  Service: Urology;                Laterality: N/A; 11/10/2017: ESOPHAGOGASTRODUODENOSCOPY (EGD) WITH PROPOFOL ; N/A     Comment:  Procedure: ESOPHAGOGASTRODUODENOSCOPY (EGD) WITH               PROPOFOL ;  Surgeon: Toledo, Ladell POUR, MD;  Location:               [name];  Service: Gastroenterology;  Laterality:               N/A; No date: HERNIA REPAIR No date: left od surgery 11/05/2020: MICROLARYNGOSCOPY; N/A     Comment:  Procedure: MICROLARYNGOSCOPY WITH BIOPSIES;  Surgeon:               Blair Mt, MD;  Location: [facility];                Service: ENT;  Laterality: N/A; No date: NEPHRECTOMY     Comment:  right kidney nephrectomy No date: rt elbow surgery 1990: THROAT SURGERY     Comment:  for [facility] removal at [facility] 07/21/2024: TRANSURETHRAL RESECTION OF BLADDER TUMOR     Comment:  Procedure: TURBT (TRANSURETHRAL RESECTION OF BLADDER               TUMOR);  Surgeon: Francisca Redell BROCKS, MD;  Location: [name]               ORS;  Service: Urology;; BMI    Body Mass Index: 28.35 kg/m     Reproductive/Obstetrics negative OB ROS                              Anesthesia Physical Anesthesia Plan  ASA: 4  Anesthesia Plan: General ETT   Post-op Pain Management:    Induction:   PONV Risk Score and Plan: 3  Airway Management Planned:  Additional Equipment:   Intra-op Plan:   Post-operative Plan:   Informed Consent: I have reviewed the patients History and Physical, chart, labs and discussed the procedure including the risks, benefits and alternatives for the proposed anesthesia with the patient or authorized representative  who has indicated his/her understanding and acceptance.     Dental Advisory Given  Plan Discussed with: CRNA  Anesthesia Plan Comments:         Anesthesia Quick Evaluation

## 2024-09-04 NOTE — H&P (Signed)
 09/04/24 11:36 AM   [name] Aug 02, 1946 [id number]  CC: Bladder cancer  HPI: Comorbid 78 year old male with long history of bladder cancer, as well as nephro ureterectomy for urothelial cell carcinoma, recent TURBT of small tumor showed microscopic muscle invasive disease.  After long conversation about options with his comorbidities he opted for a second look TURBT and attempt at bladder sparing endoscopic management alone.   PMH: Past Medical History:  Diagnosis Date   Abdominal wall hernia    a.) large; contained portions of the liver, RIGHT colon, and mid-distal small bowel   Aortic atherosclerosis (HCC)    Arthritis    Basal cell carcinoma of skin    Bifascicular block (RBBB + LPFB)    Bilateral carotid artery stenosis    a.) s/p PTA 08/26/2021 - 10 x 8 x 40 Exact stent RICA   Bladder tumor    BPH (benign prostatic hyperplasia)    Cerebral microvascular disease    Cervical spondylosis    Chronic midline thoracic back pain    Coronary artery disease    Diabetic polyneuropathy (HCC)    Diverticulosis    HFrEF (heart failure with reduced ejection fraction) (HCC)    History of 2019 novel coronavirus disease (COVID-19) 09/10/2020   a.) Tx'd with casirivimab/irdevimab infusion on 09/11/2020   History of kidney stones    HOH (hard of hearing)    Hypertension    Long-term use of aspirin  therapy    Lumbago of lumbar region with sciatica    Obesity    Pulmonary emphysema (HCC)    Renal cell cancer (HCC)    a.) s/p RIGHT nephrectomy   Spondylosis of thoracolumbar spine    T2DM (type 2 diabetes mellitus) (HCC)    Throat cancer (HCC)    Tremor     Surgical History: Past Surgical History:  Procedure Laterality Date   APPENDECTOMY     BACK SURGERY     BLADDER INSTILLATION N/A 07/21/2024   Procedure: INSTILLATION, BLADDER;  Surgeon: Francisca Redell BROCKS, MD;  Location: [name];  Service: Urology;  Laterality: N/A;  GEMCITABINE    BLADDER SURGERY     CAROTID  PTA/STENT INTERVENTION Right 08/26/2021   Procedure: CAROTID PTA/STENT INTERVENTION;  Surgeon: Jama Cordella KANDICE, MD;  Location: [name];  Service: Cardiovascular;  Laterality: Right;   CATARACT EXTRACTION W/PHACO Right 01/21/2023   Procedure: CATARACT EXTRACTION PHACO AND INTRAOCULAR LENS PLACEMENT (IOC) RIGHT DIABETIC  4.89  00:45.6;  Surgeon: Enola Feliciano Hugger, MD;  Location: [facility];  Service: Ophthalmology;  Laterality: Right;  Diabetic   CERVICAL SPINE SURGERY     CHOLECYSTECTOMY N/A 03/23/2018   Procedure: LAPAROSCOPIC CHOLECYSTECTOMY;  Surgeon: Rodolph Romano, MD;  Location: [name];  Service: General;  Laterality: N/A;   COLONOSCOPY     COLONOSCOPY WITH PROPOFOL  N/A 11/10/2017   Procedure: COLONOSCOPY WITH PROPOFOL ;  Surgeon: Toledo, Ladell POUR, MD;  Location: [name];  Service: Gastroenterology;  Laterality: N/A;   CYSTOSCOPY W/ RETROGRADES Bilateral 07/21/2024   Procedure: CYSTOSCOPY, WITH RETROGRADE PYELOGRAM;  Surgeon: Francisca Redell BROCKS, MD;  Location: [name];  Service: Urology;  Laterality: Bilateral;   CYSTOSCOPY WITH BIOPSY N/A 07/21/2024   Procedure: CYSTOSCOPY, WITH BIOPSY;  Surgeon: Francisca Redell BROCKS, MD;  Location: [name];  Service: Urology;  Laterality: N/A;   ESOPHAGOGASTRODUODENOSCOPY (EGD) WITH PROPOFOL  N/A 11/10/2017   Procedure: ESOPHAGOGASTRODUODENOSCOPY (EGD) WITH PROPOFOL ;  Surgeon: Toledo, Ladell POUR, MD;  Location: [name];  Service: Gastroenterology;  Laterality: N/A;   HERNIA  REPAIR     left od surgery     MICROLARYNGOSCOPY N/A 11/05/2020   Procedure: MICROLARYNGOSCOPY WITH BIOPSIES;  Surgeon: Blair Mt, MD;  Location: [facility];  Service: ENT;  Laterality: N/A;   NEPHRECTOMY     right kidney nephrectomy   rt elbow surgery     THROAT SURGERY  1990   for cancer removal at [facility]   TRANSURETHRAL RESECTION OF BLADDER TUMOR  07/21/2024   Procedure: TURBT (TRANSURETHRAL RESECTION OF BLADDER  TUMOR);  Surgeon: Francisca Redell BROCKS, MD;  Location: [name];  Service: Urology;;      Family History: Family History  Problem Relation Age of Onset   Diabetes Brother    Hypertension Brother    CAD Brother    CAD Brother     Social History:  reports that he quit smoking about 34 years ago. His smoking use included cigarettes. He has never used smokeless tobacco. He reports that he does not currently use alcohol. He reports that he does not use drugs.  Physical Exam: BP (!) 151/90   Pulse 66   Temp 98 F (36.7 C) (Temporal)   Resp 16   Ht 6' (1.829 m)   Wt 94.8 kg   SpO2 98%   BMI 28.35 kg/m    Constitutional:  Alert and oriented, No acute distress. Cardiovascular: Regular rate and rhythm Respiratory: Clear to auscultation bilaterally GI: Abdomen is soft, nontender, nondistended, no abdominal masses   Laboratory Data: Culture 8/5 no significant growth  Assessment & Plan:   78 year old male with new diagnosis of microscopic muscle invasive bladder cancer, he deferred more invasive treatments and opted for second look TURBT before considering more invasive treatments like consideration of cystectomy or Tri modal therapy.  We discussed transurethral resection of bladder tumor (TURBT) and risks and benefits at length. This is typically a 1 to 2-hour procedure done under general anesthesia in the operating room.  A scope is inserted through the urethra and used to resect abnormal tissue within the bladder, which is then sent to the pathologist to determine grade and stage of the tumor.  Risks include bleeding, infection, need for temporary Foley placement, and bladder perforation.  Treatment strategies are based on the type of tumor and depth of invasion.  We again reviewed the different treatment pathways for non-muscle invasive and muscle invasive bladder cancer.  TURBT today   Redell Francisca, MD 09/04/2024  [facility] [address], [address] [place],  [place] [address] [phone]

## 2024-09-04 NOTE — Op Note (Signed)
 Date of procedure: 09/04/24  Preoperative diagnosis:  Bladder tumor, 2 cm  Postoperative diagnosis:  Same  Procedure: Transurethral resection of bladder tumor(TURBT), 2cm  Surgeon: [name], MD  Anesthesia: General  Complications: None  Intraoperative findings:  Normal prostate, left ureteral orifice orthotopic No new bladder lesions, fibrinous tissue at prior resection right lateral dome Prior resection site measured 2 cm and resected entirely, excellent hemostasis  EBL: Minimal  Specimens: Bladder tumor  Drains: None  Indication: [name] is a 78 y.o. patient with recent diagnosis of stage II muscle invasive bladder cancer with microscopic detrusor invasion on TURBT, with his comorbidities and frailty opted for second look TURBT and deferred more invasive treatments like cystectomy or Tri modal therapy.  After reviewing the management options for treatment, they elected to proceed with the above surgical procedure(s). We have discussed the potential benefits and risks of the procedure, side effects of the proposed treatment, the likelihood of the patient achieving the goals of the procedure, and any potential problems that might occur during the procedure or recuperation. Informed consent has been obtained.  Description of procedure:  The patient was taken to the operating room and general anesthesia was induced. SCDs were placed for DVT prophylaxis.. The patient was placed in the dorsal lithotomy position, prepped and draped in the usual sterile fashion, and preoperative antibiotics were administered. A preoperative time-out was performed.   A 26 French resectoscope was used to intubate the urethra and a normal-appearing urethra was followed proximally in the bladder.  The prostate was small.  The left ureteral orifice was orthotopic, right surgically absent.  No new papillary tumors noted.  There was some fibrinous debris at the prior resection site that measured 2  cm at the right lateral dome.  The large bipolar resecting loop was used to methodically resect the fibrinous tissue over the prior tumor site.  There was no evidence of bladder perforation.  Meticulous hemostasis achieved.  Chips were sent for pathology.  With the bladder decompressed no bleeding was noted.  Disposition: Stable to PACU  Plan: Will contact with pathology, consider BCG and surveillance cystoscopy per patient preference  [name], MD

## 2024-09-05 ENCOUNTER — Encounter: Payer: Self-pay | Admitting: Urology

## 2024-09-06 LAB — SURGICAL PATHOLOGY

## 2024-09-06 NOTE — Anesthesia Postprocedure Evaluation (Signed)
 Anesthesia Post Note  Patient: [name]  Procedure(s) Performed: TURBT (TRANSURETHRAL RESECTION OF BLADDER TUMOR) (Bladder)  Patient location during evaluation: PACU Anesthesia Type: General Level of consciousness: awake and alert Pain management: pain level controlled Vital Signs Assessment: post-procedure vital signs reviewed and stable Respiratory status: spontaneous breathing, nonlabored ventilation, respiratory function stable and patient connected to nasal cannula oxygen Cardiovascular status: blood pressure returned to baseline and stable Postop Assessment: no apparent nausea or vomiting Anesthetic complications: no   No notable events documented.   Last Vitals:  Vitals:   09/04/24 1330 09/04/24 1338  BP: (!) 180/71 (!) 179/65  Pulse: 69 65  Resp: 17   Temp: (!) 36.2 C   SpO2: 96% 98%    Last Pain:  Vitals:   09/04/24 1330  TempSrc: Temporal  PainSc: 0-No pain                 Lynwood KANDICE Clause

## 2024-09-08 DIAGNOSIS — M5416 Radiculopathy, lumbar region: Secondary | ICD-10-CM | POA: Diagnosis not present

## 2024-09-08 DIAGNOSIS — M4802 Spinal stenosis, cervical region: Secondary | ICD-10-CM | POA: Diagnosis not present

## 2024-09-08 DIAGNOSIS — M25512 Pain in left shoulder: Secondary | ICD-10-CM | POA: Diagnosis not present

## 2024-09-08 DIAGNOSIS — G8929 Other chronic pain: Secondary | ICD-10-CM | POA: Diagnosis not present

## 2024-09-08 DIAGNOSIS — M5412 Radiculopathy, cervical region: Secondary | ICD-10-CM | POA: Diagnosis not present

## 2024-09-12 ENCOUNTER — Telehealth: Payer: Self-pay

## 2024-09-12 ENCOUNTER — Ambulatory Visit: Admitting: Urology

## 2024-09-12 VITALS — BP 166/77 | HR 66

## 2024-09-12 DIAGNOSIS — C674 Malignant neoplasm of posterior wall of bladder: Secondary | ICD-10-CM

## 2024-09-12 NOTE — Progress Notes (Signed)
   09/12/2024  Patient ID: [name], male   DOB: [birth year], 78 y.o.   MRN: [id number]  Pharmacy Quality Measure Review  This patient is appearing on a report for being at risk of failing the adherence measure for diabetes medications this calendar year.   Medication: Jardiance  Last fill date: 05/01/24 for 90 day supply  Left voicemail for patient to return my call at their convenience.  Jon VEAR Lindau, PharmD Clinical Pharmacist [phone]

## 2024-09-12 NOTE — Progress Notes (Signed)
   09/12/2024 10:05 AM   [name] 03/19/1946 [id number]  Reason for visit: Follow up bladder cancer  History: Number of comorbidities and frail Distant history of bladder cancer and right upper tract urothelial cell carcinoma treated with nephro ureterectomy with Dr. Ike 2006 New bladder lesion seen on CT June 2025 underwent TURBT TURBT July 2025 2 cm tumor at the posterior lateral wall, pathology [facility] UCC, focal muscle invasion(T2) With his comorbidities and frailty, they opted for second look TURBT and deferred cystectomy or Tri modal therapy Second look TURBT September 2025 HG UCC T1, muscle present and not involved with tumor   Today: No problems after recent TURBT Continues to prefer less invasive treatment options  Plan:   Plan for BCG induction x 6 weeks followed by close surveillance cystoscopy every 3 months, recommend at least 1 year BCG Risks and benefits discussed, including progression of disease with his microscopic stage II muscle invasion, and that standard of care would be neoadjuvant chemotherapy and cystectomy or resection/chemotherapy/radiation for Tri modal therapy Today understand the risks of BCG and surveillance   [name], MD  [facility] [address], [address] [place], [place] [address] [phone]

## 2024-09-12 NOTE — Patient Instructions (Signed)
 We will set you up for BCG treatments to prevent bladder cancer from recurring, and continue to look in your bladder in clinic 4 times per year to monitor for any recurrence.  The BCG treatments are given every week for 6 weeks to start, then spaced to 3 months, then 6 months and the follow-up visits are just 3 treatments over the course of 3 weeks.  Typically we will continue BCG for 1 to 3 years depending on your progress.   Bacillus Calmette-Guerin Live, BCG intravesical solution What is this medication? BACILLUS CALMETTE-GEURIN (buh SIL us  cal MET gey RAHN), or BCG, prevents and treats bladder cancer. It works by helping your immune system slow or stop the spread of cancer cells. This medicine may be used for other purposes; ask your health care provider or pharmacist if you have questions. COMMON BRAND NAME(S): Theracys, TICE BCG What should I tell my care team before I take this medication? They need to know if you have any of these conditions: aneurysm blood in the urine bladder biopsy within 2 weeks fever or infection immune system problems leukemia lymphoma myasthenia gravis need organ transplant prosthetic device like arterial graft, artificial joint, prosthetic heart valve recent or ongoing radiation therapy tuberculosis an unusual or allergic reaction to Bacillus Calmette-Guerin Live, BCG, latex, other medicines, foods, dyes, or preservatives pregnant or trying to get pregnant breast-feeding How should I use this medication? This drug is given as a catheter infusion into the bladder. It is administered in a hospital or clinic by a specially trained health care provider. You will be given directions to follow before the treatment. Follow your health care provider's directions carefully. This medicine contains live bacteria. It is very important to follow these directions closely after treatment to prevent others from coming in contact with your urine. Your health care provider  may give you additional directions to follow. Try to hold this medicine in your bladder for 1 to 2 hours. Follow these directions the first time you go to the bathroom and for 6 hours after the first void. Wash your hands before using the restroom. After voiding, wash your hands and genital area. Use a toilet and sit when going to the bathroom. This helps to prevent the urine from splashing. Do not use public toilets or void outside. After each void, add 2 cups of undiluted bleach to the toilet. Close the lid. Wait 15 to 20 minutes and then flush the toilet. After the first void, drink more fluids to help dilute your urine. If you have urinary incontinence, wash the clothes you were wearing in the washer immediately. Do not wash other clothes at the same time. If you are wearing an incontinence pad, pour bleach on the pad and allow it to soak into the pad before throwing it away. Put the pad in a plastic bag and put it in the trash. Talk to your pediatrician regarding the use of this medicine in children. Special care may be needed. Overdosage: If you think you have taken too much of this medicine contact a poison control center or emergency room at once. NOTE: This medicine is only for you. Do not share this medicine with others. Overdosage: If you think you have taken too much of this medicine contact a poison control center or emergency room at once. NOTE: This medicine is only for you. Do not share this medicine with others. What if I miss a dose? It is important not to miss your dose. Call your doctor  or health care professional if you are unable to keep an appointment. What may interact with this medication? antibiotics medicines to suppress your immune system like chemotherapy agents or corticosteroids medicine to treat tuberculosis This list may not describe all possible interactions. Give your health care provider a list of all the medicines, herbs, non-prescription drugs, or dietary  supplements you use. Also tell them if you smoke, drink alcohol, or use illegal drugs. Some items may interact with your medicine. This list may not describe all possible interactions. Give your health care provider a list of all the medicines, herbs, non-prescription drugs, or dietary supplements you use. Also tell them if you smoke, drink alcohol, or use illegal drugs. Some items may interact with your medicine. What should I watch for while using this medication? Visit your health care provider for checks on your progress. This medicine may make you feel generally unwell. Contact your health care provider if your symptoms last more than 2 days or if they get worse. Call your health care provider right away if you have a severe or unusual symptom. Infection can be spread to others through contact with this medicine. To prevent the spread of infection, follow your health care provider's directions carefully after treatment. Do not become pregnant while taking this medicine. There is a potential for serious side effects to an unborn child. Talk to your health care provider for more information. Do not breast-feed an infant while taking this medicine. If you have sex while on this medicine, use a condom. Ask your health care provider how long you should use a condom. What side effects may I notice from receiving this medication? Side effects that you should report to your care team as soon as possible: Allergic reactions--skin rash, itching, hives, swelling of the face, lips, tongue, or throat Flu-like symptoms--fever, chills, muscle pain, cough, headache, fatigue Red or dark brown urine Urinary tract infection (UTI)--burning when passing urine, passing frequent small amounts of urine, bloody or cloudy urine, pain in the lower back or sides Side effects that usually do not require medical attention (report these to your care team if they continue or are bothersome): General discomfort and  fatigue Headache Nausea This list may not describe all possible side effects. Call your doctor for medical advice about side effects. You may report side effects to FDA at 1-800-FDA-1088. Where should I keep my medication? This drug is given in a hospital or clinic and will not be stored at home. NOTE: This sheet is a summary. It may not cover all possible information. If you have questions about this medicine, talk to your doctor, pharmacist, or health care provider.  2025 Elsevier/Gold Standard (2024-01-18 00:00:00)

## 2024-09-15 ENCOUNTER — Other Ambulatory Visit: Payer: Self-pay | Admitting: Internal Medicine

## 2024-09-18 ENCOUNTER — Telehealth: Payer: Self-pay

## 2024-09-18 NOTE — Progress Notes (Addendum)
   09/18/2024  Patient ID: [name], male   DOB: 1946-11-03, 78 y.o.   MRN: [id number]  Spoke with patient's spouse regarding Lisinopril  and Jardiance .  Lisinopril  has been discontinued, patient no longer taking.  Still taking Jardiance . Discussed that it has not been filled since May of this year for 90 DS. Report patient had to hold med while in hospital and for several procedures in the last few months, resulting in excess on hand. They will contact pharmacy when ready for refill.  Made note of elevated BP at last office visit. Patient monitoring BID at home. Reports BP was 130/68 today.    [name], PharmD Clinical Pharmacist [phone]

## 2024-09-20 ENCOUNTER — Other Ambulatory Visit: Payer: Self-pay | Admitting: Family

## 2024-09-25 ENCOUNTER — Telehealth (INDEPENDENT_AMBULATORY_CARE_PROVIDER_SITE_OTHER): Payer: Self-pay | Admitting: Nurse Practitioner

## 2024-09-25 DIAGNOSIS — M48062 Spinal stenosis, lumbar region with neurogenic claudication: Secondary | ICD-10-CM | POA: Diagnosis not present

## 2024-09-25 DIAGNOSIS — M5416 Radiculopathy, lumbar region: Secondary | ICD-10-CM | POA: Diagnosis not present

## 2024-09-25 NOTE — Telephone Encounter (Signed)
 [name] Arland called because they have a question regarding Plavix  75 mg. She stated that during the spring, Plavix  was discontinued, not sure if PCP or [facility] who did that so that's why she called over here.  Patient noticed that he have increased dizziness since he is no longer taking Plavix  and BP have dropped as well. They were told it could be because of patient's circulation with not the help of Plavix . However, patient is taking 81 mg aspirin  still.  Please advise.

## 2024-09-25 NOTE — Telephone Encounter (Signed)
 Spoken to patient's wife and notified Fallon's comments. Verbalized understanding.

## 2024-09-25 NOTE — Telephone Encounter (Signed)
 It is unlikely that the plavix  with that drastic of a difference based on his last studies to make his circulation poor  .  It looks like it was stopped by his cardiologist in reviewing the records.  He can restart it if he likes to see if that would make a drastic difference in his symptoms.  If not, I would suggest he see his PCP

## 2024-10-11 ENCOUNTER — Ambulatory Visit: Admitting: Physician Assistant

## 2024-10-11 ENCOUNTER — Encounter: Payer: Self-pay | Admitting: Physician Assistant

## 2024-10-11 VITALS — BP 143/84 | HR 67 | Ht 72.0 in | Wt 211.2 lb

## 2024-10-11 DIAGNOSIS — D494 Neoplasm of unspecified behavior of bladder: Secondary | ICD-10-CM

## 2024-10-11 DIAGNOSIS — C679 Malignant neoplasm of bladder, unspecified: Secondary | ICD-10-CM | POA: Diagnosis not present

## 2024-10-11 DIAGNOSIS — R350 Frequency of micturition: Secondary | ICD-10-CM

## 2024-10-11 LAB — URINALYSIS, COMPLETE
Bilirubin, UA: NEGATIVE
Ketones, UA: NEGATIVE
Leukocytes,UA: NEGATIVE
Nitrite, UA: NEGATIVE
Protein,UA: NEGATIVE
Specific Gravity, UA: 1.01 (ref 1.005–1.030)
Urobilinogen, Ur: 1 mg/dL (ref 0.2–1.0)
pH, UA: 6 (ref 5.0–7.5)

## 2024-10-11 LAB — MICROSCOPIC EXAMINATION

## 2024-10-11 MED ORDER — BCG LIVE 50 MG IS SUSR
3.2400 mL | Freq: Once | INTRAVESICAL | Status: AC
  Administered 2024-10-11: 81 mg via INTRAVESICAL

## 2024-10-11 MED ORDER — GEMTESA 75 MG PO TABS: 75.0000 mg | ORAL_TABLET | Freq: Every day | ORAL | Status: AC

## 2024-10-11 NOTE — Patient Instructions (Signed)
Your Timeline for Today:  Right now through 1:50pm: Hold your urine and do your quarter turns every 15 minutes. 1:50pm-7:50pm today: Every time you urinate, pour 1/2 cup of bleach into the toilet and let it sit for 15 minutes prior to flushing. 7:50pm onward: Resume your normal routine.   Patient Education: (BCG) Into the Bladder (Intravesical Chemotherapy)  BCG is a vaccine which is used to prevent tuberculosis (TB).  But it's also a helpful treatment for some early bladder cancers.  When BCG goes directly into the bladder the treatment is described as intravesical.  BCG is a type of immunotherapy.  Immunotherapy stimulates the body's immune system to destroy cancer cells.  How it's given BCG treatment is given to you in an outpatient setting.  It takes a few minutes to administer and you can go home as soon as it's finished.  It might be a good idea to ask someone to bring you, particularly the fist time.  Unlike chemotherapy into the bladder, BCG treatment is never given immediately after surgery to remove bladder tumors.  There needs to be a delay usually of at least two weeks after surgery, before you can have it.  You won't be given treatment with BCG if you are unwell or have an infection in your urine.  You're usually asked to limit the amount you drink before your treatment.  This will help to increase the concentration of BCG in your bladder.  Drinking too much before your treatment may make your bladder feel uncomfortably full.  If you normally take water tablets (diuretics) take them later in the day after your treatment.  Your nurse or doctor will give you more advise about preparing for your treatment.  You will have a small tube (catheter) placed into your bladder.  Your doctor will then put the liquid vaccine directly into your bladder through the catheter and remove the catheter.  You will need to hold your urine for two hours afterwards.  Rotating every 15 minutes from side to  side. This can be difficult but it's to give the treatment time to work.  When the treatment is over you can go to the toilet.  After your treatment there are some precautions you'll need to take.  This is because BCG is a live vaccine and other people shouldn't be exposed to it.  For the next six hours, you'll need to avoid your urine splashing on the toilet seat and getting any urine on your hands.  It might be easer for men to sit down when they're using an ordinary toilet although using a stand up urinal should be alright.  The main this is to avoid splashing urine and spreading the vaccine.  You will also be asked to put 1/2 cup undiluted bleach into the toilet to destroy any live vaccine and leave it for 15 minutes until you flush for the next 6 voids.  Side Effects Because BCG goes directly into the bladder most of the side effects are linked with the bladder.  They usually go away within one to two days after your treatment.  The most common ones are: -needing to pass urine often -pain when you pass urine -blood in urine -flu-like symptoms (tiredness, general aching and raised temperature)  Theses side effects should settle down within a day or two.  If they don't get better contact your doctor.  Drinking lots of fluids can help flush the drug out of your bladder and reduce some of these effects.  Taking Ibuprofen or Aleve is encouraged unless you have a condition that would make these medications unsafe to take (renal failure, diabetes, gerd)  Rare side effects can include a continuing high temperature (fever), pain in your joints and a cough.  If you have any of these symptoms, or if you feel generally unwell, contact your doctor.  These symptoms could be a sign of a more serious infection (due to BCG) that needs to be treated immediately.  If this happens you'll be treated with the same drugs (antibiotics) that are used to treat TB.  Contraception Men should use a condom during sex for  the first 48 hours after their treatment.  If you are a women who has had BCG treatment then your partner should use a condom.  Using a condom will protect your partner from any vaccine present in your semen or vaginal fluid.  We don't know how BCG may affect a developing fetus so it's not advisable to become pregnant or father a child while having it.  It is important to use effective contraception during your treatment and for six weeks afterwards.  You can discuss this with your doctor or specialist nurse.

## 2024-10-11 NOTE — Progress Notes (Signed)
 BCG Bladder Instillation  BCG # 1 of 6  Due to Bladder Cancer patient is present today for a BCG treatment. Patient was cleaned and prepped in a sterile fashion with betadine. A 14FR catheter was inserted, urine return was noted , urine was yellow in color.  50ml of reconstituted BCG was instilled into the bladder. The catheter was then removed. Patient tolerated well, no complications were noted  Performed by: Lonie Rummell, PA-C and Nya Bynum, CMA  Additional notes: Remote BCG, no need to keep him for 30 minutes to assess for allergic reaction.  We reviewed side effects including irritative voiding symptoms, fatigue, body aches, and low-grade temp.  We discussed that if he develops a fever greater than 101 F, he should go to the emergency room immediately for further evaluation.  He reports chronic urinary frequency every hour.  I gave him Gemtesa samples today to help him hold the urine longer after installations.  Follow up: 1 week

## 2024-10-12 ENCOUNTER — Other Ambulatory Visit: Payer: Self-pay | Admitting: Internal Medicine

## 2024-10-12 ENCOUNTER — Telehealth: Payer: Self-pay

## 2024-10-12 DIAGNOSIS — T148XXA Other injury of unspecified body region, initial encounter: Secondary | ICD-10-CM

## 2024-10-12 NOTE — Telephone Encounter (Signed)
 Auth Submission: NO AUTH NEEDED Site of care: Urology Payer: [employer] Medication & CPT/J Code(s) submitted: BCG Diagnosis Code:  Route of submission (phone, fax, portal):  Phone # Fax # Auth type: Buy/Bill PB Units/visits requested: every 6 weeks Reference number:  Approval from: 10/12/24 to 12/27/24

## 2024-10-16 DIAGNOSIS — M5416 Radiculopathy, lumbar region: Secondary | ICD-10-CM | POA: Diagnosis not present

## 2024-10-16 DIAGNOSIS — M5412 Radiculopathy, cervical region: Secondary | ICD-10-CM | POA: Diagnosis not present

## 2024-10-16 DIAGNOSIS — M4802 Spinal stenosis, cervical region: Secondary | ICD-10-CM | POA: Diagnosis not present

## 2024-10-16 DIAGNOSIS — M48062 Spinal stenosis, lumbar region with neurogenic claudication: Secondary | ICD-10-CM | POA: Diagnosis not present

## 2024-10-18 ENCOUNTER — Encounter: Payer: Self-pay | Admitting: Physician Assistant

## 2024-10-18 ENCOUNTER — Ambulatory Visit: Admitting: Physician Assistant

## 2024-10-18 VITALS — BP 147/80 | HR 68 | Ht 72.0 in | Wt 213.4 lb

## 2024-10-18 DIAGNOSIS — C679 Malignant neoplasm of bladder, unspecified: Secondary | ICD-10-CM | POA: Diagnosis not present

## 2024-10-18 DIAGNOSIS — R350 Frequency of micturition: Secondary | ICD-10-CM | POA: Diagnosis not present

## 2024-10-18 DIAGNOSIS — D494 Neoplasm of unspecified behavior of bladder: Secondary | ICD-10-CM

## 2024-10-18 LAB — URINALYSIS, COMPLETE
Bilirubin, UA: NEGATIVE
Ketones, UA: NEGATIVE
Leukocytes,UA: NEGATIVE
Nitrite, UA: NEGATIVE
Protein,UA: NEGATIVE
Specific Gravity, UA: 1.01 (ref 1.005–1.030)
Urobilinogen, Ur: 0.2 mg/dL (ref 0.2–1.0)
pH, UA: 6 (ref 5.0–7.5)

## 2024-10-18 LAB — MICROSCOPIC EXAMINATION: Epithelial Cells (non renal): >10 /HPF — AB (ref 0–10)

## 2024-10-18 MED ORDER — BCG LIVE 50 MG IS SUSR
3.2400 mL | Freq: Once | INTRAVESICAL | Status: AC
  Administered 2024-10-18: 81 mg via INTRAVESICAL

## 2024-10-18 NOTE — Progress Notes (Signed)
 BCG Bladder Instillation  BCG # 2 of 6  Due to Bladder Cancer patient is present today for a BCG treatment. Patient was cleaned and prepped in a sterile fashion with betadine. A 14FR catheter was inserted, urine return was noted , urine was yellow in color.  50ml of reconstituted BCG was instilled into the bladder. The catheter was then removed. Patient tolerated well, no complications were noted  Performed by: Emmauel Hallums, PA-C and Nya Bynum, CMA  Follow up/ Additional notes: 1 week

## 2024-10-18 NOTE — Patient Instructions (Signed)
 Your Timeline for Today:  Right now through 1:50pm: Hold your urine and do your quarter turns every 15 minutes. 1:50pm-7:50pm today: Every time you urinate, pour 1/2 cup of bleach into the toilet and let it sit for 15 minutes prior to flushing. 7:50pm onward: Resume your normal routine.

## 2024-10-19 DIAGNOSIS — H26493 Other secondary cataract, bilateral: Secondary | ICD-10-CM | POA: Diagnosis not present

## 2024-10-19 DIAGNOSIS — E119 Type 2 diabetes mellitus without complications: Secondary | ICD-10-CM | POA: Diagnosis not present

## 2024-10-19 DIAGNOSIS — H02884 Meibomian gland dysfunction left upper eyelid: Secondary | ICD-10-CM | POA: Diagnosis not present

## 2024-10-19 DIAGNOSIS — H02881 Meibomian gland dysfunction right upper eyelid: Secondary | ICD-10-CM | POA: Diagnosis not present

## 2024-10-25 ENCOUNTER — Ambulatory Visit: Admitting: Physician Assistant

## 2024-10-25 VITALS — BP 149/85 | HR 82

## 2024-10-25 DIAGNOSIS — C679 Malignant neoplasm of bladder, unspecified: Secondary | ICD-10-CM

## 2024-10-25 DIAGNOSIS — D494 Neoplasm of unspecified behavior of bladder: Secondary | ICD-10-CM

## 2024-10-25 LAB — URINALYSIS, COMPLETE
Bilirubin, UA: NEGATIVE
Ketones, UA: NEGATIVE
Leukocytes,UA: NEGATIVE
Nitrite, UA: NEGATIVE
Protein,UA: NEGATIVE
Specific Gravity, UA: 1.015 (ref 1.005–1.030)
Urobilinogen, Ur: 0.2 mg/dL (ref 0.2–1.0)
pH, UA: 6 (ref 5.0–7.5)

## 2024-10-25 LAB — MICROSCOPIC EXAMINATION

## 2024-10-25 MED ORDER — BCG LIVE 50 MG IS SUSR
3.2400 mL | Freq: Once | INTRAVESICAL | Status: AC
  Administered 2024-10-25: 81 mg via INTRAVESICAL

## 2024-10-25 NOTE — Progress Notes (Signed)
 BCG Bladder Instillation  BCG # 3 of 6  Due to Bladder Cancer patient is present today for a BCG treatment. Patient was cleaned and prepped in a sterile fashion with betadine. A 14FR catheter was inserted, urine return was noted , urine was yellow in color.  50ml of reconstituted BCG was instilled into the bladder. The catheter was then removed. Patient tolerated well, no complications were noted  Performed by: Dorthey Depace, PA-C and Mathew Pinal, RN  Follow up/ Additional notes: 1 week

## 2024-10-29 ENCOUNTER — Other Ambulatory Visit: Payer: Self-pay | Admitting: Family

## 2024-11-01 ENCOUNTER — Ambulatory Visit: Admitting: Physician Assistant

## 2024-11-01 VITALS — BP 131/81 | HR 68 | Ht 72.0 in | Wt 211.0 lb

## 2024-11-01 DIAGNOSIS — D494 Neoplasm of unspecified behavior of bladder: Secondary | ICD-10-CM | POA: Diagnosis not present

## 2024-11-01 DIAGNOSIS — C674 Malignant neoplasm of posterior wall of bladder: Secondary | ICD-10-CM

## 2024-11-01 DIAGNOSIS — C679 Malignant neoplasm of bladder, unspecified: Secondary | ICD-10-CM | POA: Diagnosis not present

## 2024-11-01 LAB — URINALYSIS, COMPLETE
Bilirubin, UA: NEGATIVE
Ketones, UA: NEGATIVE
Leukocytes,UA: NEGATIVE
Nitrite, UA: POSITIVE — AB
Specific Gravity, UA: 1.01 (ref 1.005–1.030)
Urobilinogen, Ur: 2 mg/dL — ABNORMAL HIGH (ref 0.2–1.0)
pH, UA: 7 (ref 5.0–7.5)

## 2024-11-01 LAB — MICROSCOPIC EXAMINATION: Epithelial Cells (non renal): >10 /HPF — AB (ref 0–10)

## 2024-11-01 MED ORDER — BCG LIVE 50 MG IS SUSR
3.2400 mL | Freq: Once | INTRAVESICAL | Status: AC
  Administered 2024-11-01: 81 mg via INTRAVESICAL

## 2024-11-01 NOTE — Progress Notes (Signed)
 BCG Bladder Instillation  BCG # 4 of 6  Due to Bladder Cancer patient is present today for a BCG treatment. Patient was cleaned and prepped in a sterile fashion with betadine. A 14FR catheter was inserted, urine return was noted , urine was orange in color.  50ml of reconstituted BCG was instilled into the bladder. The catheter was then removed. Patient tolerated well, no complications were noted  Performed by: Jentri Aye, PA-C and Nya Bynum, CMA  Follow up: 1 week

## 2024-11-02 ENCOUNTER — Telehealth: Payer: Self-pay

## 2024-11-03 NOTE — Telephone Encounter (Signed)
 [name] left VM stating her has been getting BCG treatments for his bladder cancer and is almost done with them. He has an upcoming appt and needs labs but she wants to know if the treatments will effect his lab results? Please advise.

## 2024-11-04 ENCOUNTER — Other Ambulatory Visit: Payer: Self-pay | Admitting: Cardiovascular Disease

## 2024-11-07 ENCOUNTER — Other Ambulatory Visit: Payer: Self-pay | Admitting: Internal Medicine

## 2024-11-08 ENCOUNTER — Ambulatory Visit: Admitting: Physician Assistant

## 2024-11-08 DIAGNOSIS — C679 Malignant neoplasm of bladder, unspecified: Secondary | ICD-10-CM | POA: Diagnosis not present

## 2024-11-08 DIAGNOSIS — D494 Neoplasm of unspecified behavior of bladder: Secondary | ICD-10-CM | POA: Diagnosis not present

## 2024-11-08 LAB — MICROSCOPIC EXAMINATION
Epithelial Cells (non renal): >10 /HPF — AB (ref 0–10)
RBC, Urine: >30 /HPF — AB (ref 0–2)

## 2024-11-08 LAB — URINALYSIS, COMPLETE
Bilirubin, UA: NEGATIVE
Ketones, UA: NEGATIVE
Leukocytes,UA: NEGATIVE
Nitrite, UA: NEGATIVE
Protein,UA: NEGATIVE
Specific Gravity, UA: 1.015 (ref 1.005–1.030)
Urobilinogen, Ur: 0.2 mg/dL (ref 0.2–1.0)
pH, UA: 6.5 (ref 5.0–7.5)

## 2024-11-08 MED ORDER — BCG LIVE 50 MG IS SUSR
3.2400 mL | Freq: Once | INTRAVESICAL | Status: AC
  Administered 2024-11-08: 81 mg via INTRAVESICAL

## 2024-11-08 NOTE — Progress Notes (Signed)
 BCG Bladder Instillation   BCG # 5 of 6   Due to Bladder Cancer patient is present today for a BCG treatment. Patient was cleaned and prepped in a sterile fashion with betadine. A 14FR catheter was inserted, urine return was noted 105 ml, urine was orange in color.  50ml of reconstituted BCG was instilled into the bladder. The catheter was then removed. Patient tolerated well, no complications were noted   Performed by: Mathew Pinal, RN and Laymon Ned, CMA   Follow up: 1 week

## 2024-11-13 ENCOUNTER — Encounter: Payer: Self-pay | Admitting: Urology

## 2024-11-14 ENCOUNTER — Ambulatory Visit: Payer: Self-pay | Admitting: Internal Medicine

## 2024-11-14 ENCOUNTER — Ambulatory Visit (INDEPENDENT_AMBULATORY_CARE_PROVIDER_SITE_OTHER): Admitting: Internal Medicine

## 2024-11-14 ENCOUNTER — Encounter: Payer: Self-pay | Admitting: Internal Medicine

## 2024-11-14 VITALS — BP 108/80 | HR 69 | Ht 72.0 in | Wt 212.4 lb

## 2024-11-14 DIAGNOSIS — E1159 Type 2 diabetes mellitus with other circulatory complications: Secondary | ICD-10-CM | POA: Diagnosis not present

## 2024-11-14 DIAGNOSIS — E1165 Type 2 diabetes mellitus with hyperglycemia: Secondary | ICD-10-CM

## 2024-11-14 DIAGNOSIS — E782 Mixed hyperlipidemia: Secondary | ICD-10-CM | POA: Diagnosis not present

## 2024-11-14 DIAGNOSIS — Z23 Encounter for immunization: Secondary | ICD-10-CM | POA: Insufficient documentation

## 2024-11-14 DIAGNOSIS — E1169 Type 2 diabetes mellitus with other specified complication: Secondary | ICD-10-CM

## 2024-11-14 DIAGNOSIS — E663 Overweight: Secondary | ICD-10-CM

## 2024-11-14 DIAGNOSIS — D414 Neoplasm of uncertain behavior of bladder: Secondary | ICD-10-CM

## 2024-11-14 DIAGNOSIS — K439 Ventral hernia without obstruction or gangrene: Secondary | ICD-10-CM

## 2024-11-14 DIAGNOSIS — I152 Hypertension secondary to endocrine disorders: Secondary | ICD-10-CM | POA: Diagnosis not present

## 2024-11-14 DIAGNOSIS — Z6828 Body mass index (BMI) 28.0-28.9, adult: Secondary | ICD-10-CM | POA: Diagnosis not present

## 2024-11-14 LAB — POCT CBG (FASTING - GLUCOSE)-MANUAL ENTRY: Glucose Fasting, POC: 166 mg/dL — AB (ref 70–99)

## 2024-11-14 MED ORDER — HYDROCHLOROTHIAZIDE 12.5 MG PO CAPS
12.5000 mg | ORAL_CAPSULE | Freq: Every day | ORAL | 3 refills | Status: DC
Start: 2024-11-14 — End: 2024-11-14

## 2024-11-14 MED ORDER — INSULIN NPH ISOPHANE & REGULAR (70-30) 100 UNIT/ML ~~LOC~~ SUSP
37.0000 [IU] | Freq: Two times a day (BID) | SUBCUTANEOUS | 3 refills | Status: DC
Start: 2024-11-14 — End: 2024-11-17

## 2024-11-14 MED ORDER — HYDROCHLOROTHIAZIDE 12.5 MG PO CAPS: ORAL_CAPSULE | ORAL | 3 refills | Status: AC

## 2024-11-14 NOTE — Progress Notes (Signed)
 Established Patient Office Visit  Subjective:  Patient ID: [name], male    DOB: 1946/06/29  Age: 78 y.o. MRN: [id number]  Chief Complaint  Patient presents with   Follow-up    3 month follow up    Patient is here today for follow up. He has new concerns to discuss today.  He reports he is feeling fairly well today. He has undergone bladder cancer surgery and has 1 cancer treatment remaining with Urology/Oncology. Denies urinary or stool complications  He endorses increased postural dizziness from going from sitting to standing. He reports intermittent right leg swelling. He is on chlorthalidone  25 mg daily. Will reduce and change to hydrochlorothiazide  12.5 mg Monday, Wednesday, Friday and continue Coreg  as prescribed.  Patient reports bilateral shoulder pain. He has seen orthopedics in the past for back pain and has received injections in his back in the past. They are awaiting him to finish bladder cancer treatments before restarting injections. He reports clicking popping sensation.  Patient would like flu shot today. Patient is due for his AWV and will get labs when he returns.    No other concerns at this time.   Past Medical History:  Diagnosis Date   Abdominal wall hernia    a.) large; contained portions of the liver, RIGHT colon, and mid-distal small bowel   Aortic atherosclerosis    Arthritis    Basal cell carcinoma of skin    Bifascicular block (RBBB + LPFB)    Bilateral carotid artery stenosis    a.) s/p PTA 08/26/2021 - 10 x 8 x 40 Exact stent RICA   Bladder tumor    BPH (benign prostatic hyperplasia)    Cerebral microvascular disease    Cervical spondylosis    Chronic midline thoracic back pain    Coronary artery disease    Diabetic polyneuropathy (HCC)    Diverticulosis    HFrEF (heart failure with reduced ejection fraction) (HCC)    History of 2019 novel coronavirus disease (COVID-19) 09/10/2020   a.) Tx'd with casirivimab/irdevimab infusion on  09/11/2020   History of kidney stones    HOH (hard of hearing)    Hypertension    Long-term use of aspirin  therapy    Lumbago of lumbar region with sciatica    Obesity    Pulmonary emphysema (HCC)    Renal cell cancer (HCC)    a.) s/p RIGHT nephrectomy   Spondylosis of thoracolumbar spine    T2DM (type 2 diabetes mellitus) (HCC)    Throat cancer (HCC)    Tremor     Past Surgical History:  Procedure Laterality Date   APPENDECTOMY     BACK SURGERY     BLADDER INSTILLATION N/A 07/21/2024   Procedure: INSTILLATION, BLADDER;  Surgeon: Francisca Redell BROCKS, MD;  Location: [name];  Service: Urology;  Laterality: N/A;  GEMCITABINE    BLADDER SURGERY     CAROTID PTA/STENT INTERVENTION Right 08/26/2021   Procedure: CAROTID PTA/STENT INTERVENTION;  Surgeon: Jama Cordella KANDICE, MD;  Location: [name];  Service: Cardiovascular;  Laterality: Right;   CATARACT EXTRACTION W/PHACO Right 01/21/2023   Procedure: CATARACT EXTRACTION PHACO AND INTRAOCULAR LENS PLACEMENT (IOC) RIGHT DIABETIC  4.89  00:45.6;  Surgeon: Enola Feliciano Hugger, MD;  Location: [facility];  Service: Ophthalmology;  Laterality: Right;  Diabetic   CERVICAL SPINE SURGERY     CHOLECYSTECTOMY N/A 03/23/2018   Procedure: LAPAROSCOPIC CHOLECYSTECTOMY;  Surgeon: Rodolph Romano, MD;  Location: [name];  Service: General;  Laterality: N/A;  COLONOSCOPY     COLONOSCOPY WITH PROPOFOL  N/A 11/10/2017   Procedure: COLONOSCOPY WITH PROPOFOL ;  Surgeon: Toledo, Ladell POUR, MD;  Location: [name];  Service: Gastroenterology;  Laterality: N/A;   CYSTOSCOPY W/ RETROGRADES Bilateral 07/21/2024   Procedure: CYSTOSCOPY, WITH RETROGRADE PYELOGRAM;  Surgeon: Francisca Redell BROCKS, MD;  Location: [name];  Service: Urology;  Laterality: Bilateral;   CYSTOSCOPY WITH BIOPSY N/A 07/21/2024   Procedure: CYSTOSCOPY, WITH BIOPSY;  Surgeon: Francisca Redell BROCKS, MD;  Location: [name];  Service: Urology;  Laterality: N/A;    ESOPHAGOGASTRODUODENOSCOPY (EGD) WITH PROPOFOL  N/A 11/10/2017   Procedure: ESOPHAGOGASTRODUODENOSCOPY (EGD) WITH PROPOFOL ;  Surgeon: Toledo, Ladell POUR, MD;  Location: [name];  Service: Gastroenterology;  Laterality: N/A;   HERNIA REPAIR     left od surgery     MICROLARYNGOSCOPY N/A 11/05/2020   Procedure: MICROLARYNGOSCOPY WITH BIOPSIES;  Surgeon: Blair Mt, MD;  Location: [facility];  Service: ENT;  Laterality: N/A;   NEPHRECTOMY     right kidney nephrectomy   rt elbow surgery     THROAT SURGERY  1990   for cancer removal at [facility]   TRANSURETHRAL RESECTION OF BLADDER TUMOR  07/21/2024   Procedure: TURBT (TRANSURETHRAL RESECTION OF BLADDER TUMOR);  Surgeon: Francisca Redell BROCKS, MD;  Location: [name];  Service: Urology;;   TRANSURETHRAL RESECTION OF BLADDER TUMOR N/A 09/04/2024   Procedure: TURBT (TRANSURETHRAL RESECTION OF BLADDER TUMOR);  Surgeon: Francisca Redell BROCKS, MD;  Location: [name];  Service: Urology;  Laterality: N/A;    Social History   Socioeconomic History   Marital status: Married    Spouse name: Schiano,[name] (Spouse)   Number of children: Not on file   Years of education: Not on file   Highest education level: Not on file  Occupational History   Not on file  Tobacco Use   Smoking status: Former    Current packs/day: 0.00    Types: Cigarettes    Quit date: 80    Years since quitting: 34.9   Smokeless tobacco: Never  Vaping Use   Vaping status: Never Used  Substance and Sexual Activity   Alcohol use: Not Currently    Comment: Occasional beer drinker   Drug use: No   Sexual activity: Yes  Other Topics Concern   Not on file  Social History Narrative   Lives at home with wife, active and independent at baseline   Social Drivers of Health   Financial Resource Strain: Low Risk  (07/05/2024)   Received from [facility]   Overall Financial Resource Strain (CARDIA)    Difficulty of Paying Living Expenses: Not hard at  all  Food Insecurity: No Food Insecurity (07/05/2024)   Received from [facility]   Hunger Vital Sign    Within the past 12 months, you worried that your food would run out before you got the money to buy more.: Never true    Within the past 12 months, the food you bought just didn't last and you didn't have money to get more.: Never true  Transportation Needs: No Transportation Needs (07/05/2024)   Received from [facility] - Transportation    In the past 12 months, has lack of transportation kept you from medical appointments or from getting medications?: No    Lack of Transportation (Non-Medical): No  Physical Activity: Not on file  Stress: Not on file  Social Connections: Socially Integrated (04/26/2024)   Social Connection and Isolation Panel  Frequency of Communication with Friends and Family: More than three times a week    Frequency of Social Gatherings with Friends and Family: Twice a week    Attends Religious Services: More than 4 times per year    Active Member of [employer] or Organizations: Yes    Attends Engineer, Structural: More than 4 times per year    Marital Status: Married  Catering Manager Violence: Not At Risk (04/26/2024)   Humiliation, Afraid, Rape, and Kick questionnaire    Fear of Current or Ex-Partner: No    Emotionally Abused: No    Physically Abused: No    Sexually Abused: No    Family History  Problem Relation Age of Onset   Diabetes Brother    Hypertension Brother    CAD Brother    CAD Brother     Allergies  Allergen Reactions   Metformin Diarrhea    Outpatient Medications Prior to Visit  Medication Sig   acetaminophen  (TYLENOL ) 500 MG tablet Take 500 mg by mouth every 6 (six) hours as needed for moderate pain (pain score 4-6).   amitriptyline  (ELAVIL ) 25 MG tablet TAKE 2 TABLETS BY MOUTH AT BEDTIME   aspirin  EC 81 MG tablet Take 81 mg by mouth daily. Swallow whole.   baclofen  (LIORESAL ) 10 MG  tablet Take 1 tablet (10 mg total) by mouth at bedtime.   carvedilol  (COREG ) 6.25 MG tablet Take 1 tablet (6.25 mg total) by mouth 2 (two) times daily.   cetirizine  (ZYRTEC  ALLERGY) 10 MG tablet Take 1 tablet (10 mg total) by mouth daily.   Cholecalciferol  50 MCG (2000 UT) CAPS Take 2,000 Units by mouth daily.   cyanocobalamin  (VITAMIN B12) 500 MCG tablet Take 500 mcg by mouth daily.   empagliflozin  (JARDIANCE ) 25 MG TABS tablet Take 25 mg by mouth daily.   fluorouracil (EFUDEX) 5 % cream Apply topically. Apply topically two (2) times a day. Use for 4 weeks   fluticasone  (FLONASE ) 50 MCG/ACT nasal spray Use 2 spray(s) in each nostril once daily   glimepiride  (AMARYL ) 2 MG tablet Take 1 tablet by mouth twice daily   Menthol, Topical Analgesic, (BIOFREEZE COOL THE PAIN) 5 % PTCH Place 1 patch onto the skin daily as needed (pain).   ONETOUCH ULTRA test strip USE TO TEST BLOOD SUGAR THREE TIMES DAILY   primidone  (MYSOLINE ) 50 MG tablet Take 1 tablet (50 mg total) by mouth in the morning.   rosuvastatin  (CRESTOR ) 40 MG tablet TAKE 1 TABLET BY MOUTH ONCE DAILY REPLACES  ATORVASTATIN   tamsulosin  (FLOMAX ) 0.4 MG CAPS capsule TAKE 1 CAPSULE BY MOUTH ONCE DAILY AFTER BREAKFAST   Vibegron (GEMTESA) 75 MG TABS Take 1 tablet (75 mg total) by mouth daily.   [DISCONTINUED] chlorthalidone  (HYGROTON ) 25 MG tablet Take 1 tablet by mouth once daily   [DISCONTINUED] insulin  NPH-regular Human (NOVOLIN 70/30) (70-30) 100 UNIT/ML injection Inject 30-35 Units into the skin 2 (two) times daily with a meal.   Facility-Administered Medications Prior to Visit  Medication Dose Route Frequency Provider   technetium sestamibi generic (CARDIOLITE ) injection 10 millicurie  10 millicurie Intravenous Once PRN Fernand Denyse LABOR, MD    Review of Systems  Constitutional: Negative.  Negative for chills, fever and malaise/fatigue.  HENT: Negative.  Negative for congestion and sore throat.   Eyes: Negative.  Negative for blurred  vision and pain.  Respiratory: Negative.  Negative for cough and shortness of breath.   Cardiovascular:  Negative for chest pain and palpitations. Leg  swelling: RLE edema. Gastrointestinal: Negative.  Negative for abdominal pain, blood in stool, constipation, diarrhea, heartburn, melena, nausea and vomiting.  Genitourinary: Negative.  Negative for dysuria, flank pain, frequency and urgency.  Musculoskeletal:  Positive for joint pain (bilateral shoulder pain). Negative for myalgias.  Skin: Negative.   Neurological: Negative.  Negative for dizziness, tingling, sensory change, weakness and headaches.  Endo/Heme/Allergies: Negative.   Psychiatric/Behavioral: Negative.  Negative for depression and suicidal ideas. The patient is not nervous/anxious.        Objective:   BP 108/80   Pulse 69   Ht 6' (1.829 m)   Wt 212 lb 6.4 oz (96.3 kg)   SpO2 99%   BMI 28.81 kg/m   Vitals:   11/14/24 0951  BP: 108/80  Pulse: 69  Height: 6' (1.829 m)  Weight: 212 lb 6.4 oz (96.3 kg)  SpO2: 99%  BMI (Calculated): 28.8    Physical Exam Vitals and nursing note reviewed.  Constitutional:      General: He is not in acute distress.    Appearance: Normal appearance. He is not ill-appearing.  HENT:     Head: Normocephalic and atraumatic.     Nose: Nose normal.     Mouth/Throat:     Mouth: Mucous membranes are moist.     Pharynx: Oropharynx is clear.  Eyes:     Conjunctiva/sclera: Conjunctivae normal.     Pupils: Pupils are equal, round, and reactive to light.  Cardiovascular:     Rate and Rhythm: Normal rate and regular rhythm.     Pulses: Normal pulses.     Heart sounds: Normal heart sounds.  Pulmonary:     Effort: Pulmonary effort is normal.     Breath sounds: Normal breath sounds. No wheezing or rhonchi.  Abdominal:     General: Bowel sounds are normal. There is no distension.     Palpations: Abdomen is soft.     Tenderness: There is no abdominal tenderness.  Musculoskeletal:         General: Normal range of motion.     Cervical back: Normal range of motion and neck supple.     Right lower leg: No edema.     Left lower leg: No edema.  Skin:    General: Skin is warm and dry.     Capillary Refill: Capillary refill takes less than 2 seconds.  Neurological:     General: No focal deficit present.     Mental Status: He is alert and oriented to person, place, and time.     Sensory: No sensory deficit.     Motor: No weakness.  Psychiatric:        Mood and Affect: Mood normal.        Behavior: Behavior normal.        Judgment: Judgment normal.      Results for orders placed or performed in visit on 11/14/24  POCT CBG (Fasting - Glucose)  Result Value Ref Range   Glucose Fasting, POC 166 (A) 70 - 99 mg/dL    Recent Results (from the past 2160 hours)  Hemoglobin A1c     Status: Abnormal   Collection Time: 08/22/24  8:54 AM  Result Value Ref Range   Hgb A1c MFr Bld 6.9 (H) 4.8 - 5.6 %    Comment:          Prediabetes: 5.7 - 6.4          Diabetes: >6.4  Glycemic control for adults with diabetes: <7.0    Est. average glucose Bld gHb Est-mCnc 151 mg/dL  TSH     Status: None   Collection Time: 08/22/24  8:54 AM  Result Value Ref Range   TSH 1.310 0.450 - 4.500 uIU/mL  CMP14+EGFR     Status: Abnormal   Collection Time: 08/22/24  8:54 AM  Result Value Ref Range   Glucose 148 (H) 70 - 99 mg/dL   BUN 14 8 - 27 mg/dL   Creatinine, Ser 8.81 0.76 - 1.27 mg/dL   eGFR 64 >40 fO/fpw/8.26   BUN/Creatinine Ratio 12 10 - 24   Sodium 142 134 - 144 mmol/L   Potassium 3.8 3.5 - 5.2 mmol/L   Chloride 104 96 - 106 mmol/L   CO2 23 20 - 29 mmol/L   Calcium  10.8 (H) 8.6 - 10.2 mg/dL   Total Protein 6.5 6.0 - 8.5 g/dL   Albumin 4.1 3.8 - 4.8 g/dL   Globulin, Total 2.4 1.5 - 4.5 g/dL   Bilirubin Total 0.5 0.0 - 1.2 mg/dL   Alkaline Phosphatase 65 44 - 121 IU/L   AST 16 0 - 40 IU/L   ALT 13 0 - 44 IU/L  Lipid panel     Status: None   Collection Time: 08/22/24  8:54  AM  Result Value Ref Range   Cholesterol, Total 127 100 - 199 mg/dL   Triglycerides 878 0 - 149 mg/dL   HDL 43 >60 mg/dL   VLDL Cholesterol Cal 22 5 - 40 mg/dL   LDL Chol Calc (NIH) 62 0 - 99 mg/dL   Chol/HDL Ratio 3.0 0.0 - 5.0 ratio    Comment:                                   T. Chol/HDL Ratio                                             Men  Women                               1/2 Avg.Risk  3.4    3.3                                   Avg.Risk  5.0    4.4                                2X Avg.Risk  9.6    7.1                                3X Avg.Risk 23.4   11.0   Surgical pathology     Status: None   Collection Time: 09/04/24 12:00 AM  Result Value Ref Range   SURGICAL PATHOLOGY      SURGICAL PATHOLOGY [facility] [address], [address] [place], [place] [address] Telephone [phone] or [phone] Fax [phone]  REPORT OF SURGICAL PATHOLOGY   Accession #: [id number] Patient Name: MASSIE, MEES Visit # : [id number]  MRN: [id number] Physician: Francisca Rogue DOB/Age 76-Apr-1947 (Age: 77) Gender: M Collected Date: 09/04/2024 Received Date: 09/04/2024  FINAL DIAGNOSIS       1. Bladder, transurethral resection, tumor :       HIGH GRADE UROTHELIAL CARCINOMA      THE CARCINOMA INVADES LAMINAR PROPRIA      MUSCULARIS PROPRIA (DETRUSOR MUSCLE) IS PRESENT WITH INFLAMMATION AND ISCHEMIC      NECROSIS       DATE SIGNED OUT: 09/06/2024 ELECTRONIC SIGNATURE : Stuart Come, Zhaoli, Pathologist, Electronic Signature  MICROSCOPIC DESCRIPTION  CASE COMMENTS STAINS USED IN DIAGNOSIS: H&E    CLINICAL HISTORY  SPECIMEN(S) OBTAINED 1. Bladder, transurethral resection, Tumor  SPECIMEN COMMENTS: SPECIMEN CLINICAL INFORM ATION: 1. S/P bladder tumor    Gross Description 1. Received fresh and placed in formalin is a 1.8 x 1.8 x 0.3 cm aggregate of tan, firm tissue submitted entirely in 1 block. mb 09-05-24        Report signed out from  the following location(s) [facility] [address] [place], [place] [address] CLIA #: [id number]  [facility] [address] [place], [place] [address] CLIA #: [id number]   Glucose, capillary     Status: Abnormal   Collection Time: 09/04/24 10:59 AM  Result Value Ref Range   Glucose-Capillary 154 (H) 70 - 99 mg/dL    Comment: Glucose reference range applies only to samples taken after fasting for at least 8 hours.  Glucose, capillary     Status: Abnormal   Collection Time: 09/04/24 12:40 PM  Result Value Ref Range   Glucose-Capillary 143 (H) 70 - 99 mg/dL    Comment: Glucose reference range applies only to samples taken after fasting for at least 8 hours.  Urinalysis, Complete     Status: Abnormal   Collection Time: 10/11/24 10:47 AM  Result Value Ref Range   Specific Gravity, UA 1.010 1.005 - 1.030   pH, UA 6.0 5.0 - 7.5   Color, UA Yellow Yellow   Appearance Ur Clear Clear   Leukocytes,UA Negative Negative   Protein,UA Negative Negative/Trace   Glucose, UA 3+ (A) Negative   Ketones, UA Negative Negative   RBC, UA Trace (A) Negative   Bilirubin, UA Negative Negative   Urobilinogen, Ur 1.0 0.2 - 1.0 mg/dL   Nitrite, UA Negative Negative   Microscopic Examination See below:     Comment: Microscopic was indicated and was performed.  Microscopic Examination     Status: Abnormal   Collection Time: 10/11/24 10:47 AM   Urine  Result Value Ref Range   WBC, UA 0-5 0 - 5 /hpf   RBC, Urine 0-2 0 - 2 /hpf   Epithelial Cells (non renal) 0-10 0 - 10 /hpf   Mucus, UA Present (A) Not Estab.   Bacteria, UA Few None seen/Few  Urinalysis, Complete     Status: Abnormal   Collection Time: 10/18/24 11:26 AM  Result Value Ref Range   Specific Gravity, UA 1.010 1.005 - 1.030   pH, UA 6.0 5.0 - 7.5   Color, UA Yellow Yellow   Appearance Ur Clear Clear   Leukocytes,UA Negative Negative   Protein,UA Negative Negative/Trace   Glucose, UA 3+ (A) Negative    Ketones, UA Negative Negative   RBC, UA Trace (A) Negative   Bilirubin, UA Negative Negative   Urobilinogen, Ur 0.2 0.2 - 1.0 mg/dL   Nitrite, UA Negative Negative   Microscopic Examination See below:  Comment: Microscopic was indicated and was performed.  Microscopic Examination     Status: Abnormal   Collection Time: 10/18/24 11:26 AM   Urine  Result Value Ref Range   WBC, UA 6-10 (A) 0 - 5 /hpf   RBC, Urine 3-10 (A) 0 - 2 /hpf   Epithelial Cells (non renal) >10 (A) 0 - 10 /hpf   Bacteria, UA Few None seen/Few  Urinalysis, Complete     Status: Abnormal   Collection Time: 10/25/24 10:51 AM  Result Value Ref Range   Specific Gravity, UA 1.015 1.005 - 1.030   pH, UA 6.0 5.0 - 7.5   Color, UA Yellow Yellow   Appearance Ur Clear Clear   Leukocytes,UA Negative Negative   Protein,UA Negative Negative/Trace   Glucose, UA 3+ (A) Negative   Ketones, UA Negative Negative   RBC, UA 1+ (A) Negative   Bilirubin, UA Negative Negative   Urobilinogen, Ur 0.2 0.2 - 1.0 mg/dL   Nitrite, UA Negative Negative   Microscopic Examination See below:     Comment: Microscopic was indicated and was performed.  Microscopic Examination     Status: Abnormal   Collection Time: 10/25/24 10:51 AM   Urine  Result Value Ref Range   WBC, UA 6-10 (A) 0 - 5 /hpf   RBC, Urine 11-30 (A) 0 - 2 /hpf   Epithelial Cells (non renal) 0-10 0 - 10 /hpf   Bacteria, UA Few None seen/Few  Urinalysis, Complete     Status: Abnormal   Collection Time: 11/01/24 10:42 AM  Result Value Ref Range   Specific Gravity, UA 1.010 1.005 - 1.030   pH, UA 7.0 5.0 - 7.5   Color, UA Orange Yellow   Appearance Ur Clear Clear   Leukocytes,UA Negative Negative   Protein,UA Trace Negative/Trace   Glucose, UA 3+ (A) Negative   Ketones, UA Negative Negative   RBC, UA Trace (A) Negative   Bilirubin, UA Negative Negative   Urobilinogen, Ur 2.0 (H) 0.2 - 1.0 mg/dL   Nitrite, UA Positive (A) Negative   Microscopic Examination See  below:     Comment: Microscopic was indicated and was performed.  Microscopic Examination     Status: Abnormal   Collection Time: 11/01/24 10:42 AM   Urine  Result Value Ref Range   WBC, UA 0-5 0 - 5 /hpf   RBC, Urine 3-10 (A) 0 - 2 /hpf   Epithelial Cells (non renal) >10 (A) 0 - 10 /hpf   Mucus, UA Present (A) Not Estab.   Bacteria, UA Few None seen/Few  Urinalysis, Complete     Status: Abnormal   Collection Time: 11/08/24 11:13 AM  Result Value Ref Range   Specific Gravity, UA 1.015 1.005 - 1.030   pH, UA 6.5 5.0 - 7.5   Color, UA Yellow Yellow   Appearance Ur Slightly cloudy Clear   Leukocytes,UA Negative Negative   Protein,UA Negative Negative/Trace   Glucose, UA 3+ (A) Negative   Ketones, UA Negative Negative   RBC, UA 2+ (A) Negative   Bilirubin, UA Negative Negative   Urobilinogen, Ur 0.2 0.2 - 1.0 mg/dL   Nitrite, UA Negative Negative   Microscopic Examination See below:     Comment: Microscopic was indicated and was performed.  Microscopic Examination     Status: Abnormal   Collection Time: 11/08/24 11:13 AM   Urine  Result Value Ref Range   WBC, UA 6-10 (A) 0 - 5 /hpf   RBC, Urine >30 (A)  0 - 2 /hpf   Epithelial Cells (non renal) >10 (A) 0 - 10 /hpf   Bacteria, UA Few None seen/Few  CULTURE, URINE COMPREHENSIVE     Status: None (Preliminary result)   Collection Time: 11/08/24  1:04 PM   Specimen: Urine   UR  Result Value Ref Range   Urine Culture, Comprehensive Preliminary report    Organism ID, Bacteria Comment     Comment: Microbiological testing to rule out the presence of possible pathogens is in progress. 300 Colonies/mL    Organism ID, Bacteria Comment     Comment: Microbiological testing to rule out the presence of possible pathogens is in progress.   POCT CBG (Fasting - Glucose)     Status: Abnormal   Collection Time: 11/14/24  9:56 AM  Result Value Ref Range   Glucose Fasting, POC 166 (A) 70 - 99 mg/dL      Assessment & Plan:  Start  hydrochlorothiazide  12.5 mg Monday, Wednesday, Friday. Stop chlorthalidone . Refill sent for insulin . Continue other medications as prescribed. Flu shot given. Problem List Items Addressed This Visit     Ventral hernia   Bladder neoplasm of uncertain malignant potential   Type 2 diabetes mellitus with hyperglycemia, without long-term current use of insulin  (HCC)   Relevant Medications   insulin  NPH-regular Human (70-30) 100 UNIT/ML injection   Other Relevant Orders   POCT CBG (Fasting - Glucose) (Completed)   Hemoglobin A1c   Hypertension associated with diabetes (HCC) - Primary   Relevant Medications   hydrochlorothiazide  (MICROZIDE ) 12.5 MG capsule   insulin  NPH-regular Human (70-30) 100 UNIT/ML injection   Other Relevant Orders   CMP14+EGFR   Lipid panel   Hemoglobin A1c   TSH   Combined hyperlipidemia associated with type 2 diabetes mellitus (HCC)   Relevant Medications   hydrochlorothiazide  (MICROZIDE ) 12.5 MG capsule   insulin  NPH-regular Human (70-30) 100 UNIT/ML injection   Other Relevant Orders   Lipid panel   Overweight with body mass index (BMI) of 28 to 28.9 in adult   Needs flu shot   Relevant Orders   Flu Vaccine Trivalent High Dose (Fluad) (Completed)    Return in about 2 weeks (around 11/28/2024) for AWV.   Total time spent: 25 minutes. This time includes review of previous notes and results and patient face to face interaction during today's visit.    FERNAND FREDY RAMAN, MD  11/14/2024   This document may have been prepared by [facility] Voice Recognition software and as such may include unintentional dictation errors.

## 2024-11-15 ENCOUNTER — Ambulatory Visit: Admitting: Physician Assistant

## 2024-11-15 VITALS — BP 166/80 | HR 79

## 2024-11-15 DIAGNOSIS — C679 Malignant neoplasm of bladder, unspecified: Secondary | ICD-10-CM

## 2024-11-15 DIAGNOSIS — D494 Neoplasm of unspecified behavior of bladder: Secondary | ICD-10-CM | POA: Diagnosis not present

## 2024-11-15 LAB — MICROSCOPIC EXAMINATION

## 2024-11-15 LAB — CULTURE, URINE COMPREHENSIVE

## 2024-11-15 MED ORDER — BCG LIVE 50 MG IS SUSR
3.2400 mL | Freq: Once | INTRAVESICAL | Status: AC
  Administered 2024-11-15: 81 mg via INTRAVESICAL

## 2024-11-15 NOTE — Progress Notes (Signed)
 BCG Bladder Instillation  BCG # 6  Due to Bladder Cancer patient is present today for a BCG treatment. Patient was cleaned and prepped in a sterile fashion with betadine. A 14FR catheter was inserted, urine return was noted 75ml, urine was yellow in color.  50ml of reconstituted BCG was instilled into the bladder. The catheter was then removed. Patient tolerated well, no complications were noted  Performed by: Melia Hopes, PA-C and Humberta Magallon-Mariche, CMA  Follow up/ Additional notes: Return for Keep follow-up as scheduled.

## 2024-11-16 LAB — URINALYSIS, COMPLETE
Bilirubin, UA: NEGATIVE
Ketones, UA: NEGATIVE
Leukocytes,UA: NEGATIVE
Nitrite, UA: NEGATIVE
Specific Gravity, UA: 1.015 (ref 1.005–1.030)
Urobilinogen, Ur: 1 mg/dL (ref 0.2–1.0)
pH, UA: 6 (ref 5.0–7.5)

## 2024-11-16 LAB — MICROSCOPIC EXAMINATION: RBC, Urine: >30 /HPF — AB (ref 0–2)

## 2024-11-17 ENCOUNTER — Other Ambulatory Visit: Payer: Self-pay

## 2024-11-17 ENCOUNTER — Ambulatory Visit: Payer: Self-pay | Admitting: Physician Assistant

## 2024-11-17 DIAGNOSIS — E1165 Type 2 diabetes mellitus with hyperglycemia: Secondary | ICD-10-CM

## 2024-11-17 MED ORDER — INSULIN NPH ISOPHANE & REGULAR (70-30) 100 UNIT/ML ~~LOC~~ SUSP: 37.0000 [IU] | Freq: Two times a day (BID) | SUBCUTANEOUS | 1 refills | Status: AC

## 2024-11-18 ENCOUNTER — Other Ambulatory Visit: Payer: Self-pay | Admitting: Cardiology

## 2024-11-20 ENCOUNTER — Other Ambulatory Visit: Payer: Self-pay | Admitting: Internal Medicine

## 2024-11-27 ENCOUNTER — Other Ambulatory Visit: Payer: Self-pay | Admitting: Internal Medicine

## 2024-11-30 ENCOUNTER — Ambulatory Visit: Admitting: Internal Medicine

## 2024-12-12 ENCOUNTER — Other Ambulatory Visit

## 2024-12-12 ENCOUNTER — Other Ambulatory Visit: Payer: Self-pay | Admitting: Internal Medicine

## 2024-12-13 LAB — CMP14+EGFR
ALT: 18 IU/L (ref 0–44)
AST: 30 IU/L (ref 0–40)
Albumin: 4.3 g/dL (ref 3.8–4.8)
Alkaline Phosphatase: 64 IU/L (ref 47–123)
BUN/Creatinine Ratio: 14 (ref 10–24)
BUN: 17 mg/dL (ref 8–27)
Bilirubin Total: 0.6 mg/dL (ref 0.0–1.2)
CO2: 22 mmol/L (ref 20–29)
Calcium: 11.5 mg/dL — ABNORMAL HIGH (ref 8.6–10.2)
Chloride: 100 mmol/L (ref 96–106)
Creatinine, Ser: 1.18 mg/dL (ref 0.76–1.27)
Globulin, Total: 2.8 g/dL (ref 1.5–4.5)
Glucose: 157 mg/dL — ABNORMAL HIGH (ref 70–99)
Potassium: 4.5 mmol/L (ref 3.5–5.2)
Sodium: 141 mmol/L (ref 134–144)
Total Protein: 7.1 g/dL (ref 6.0–8.5)
eGFR: 64 mL/min/1.73 (ref >59)

## 2024-12-13 LAB — LIPID PANEL
Chol/HDL Ratio: 3 ratio (ref 0.0–5.0)
Cholesterol, Total: 135 mg/dL (ref 100–199)
HDL: 45 mg/dL (ref >39)
LDL Chol Calc (NIH): 66 mg/dL (ref 0–99)
Triglycerides: 140 mg/dL (ref 0–149)
VLDL Cholesterol Cal: 24 mg/dL (ref 5–40)

## 2024-12-13 LAB — TSH: TSH: 1.98 u[IU]/mL (ref 0.450–4.500)

## 2024-12-13 LAB — HEMOGLOBIN A1C
Est. average glucose Bld gHb Est-mCnc: 143 mg/dL
Hgb A1c MFr Bld: 6.6 % — ABNORMAL HIGH (ref 4.8–5.6)

## 2024-12-14 ENCOUNTER — Ambulatory Visit (INDEPENDENT_AMBULATORY_CARE_PROVIDER_SITE_OTHER): Admitting: Internal Medicine

## 2024-12-14 VITALS — BP 170/84 | HR 73 | Ht 66.0 in | Wt 214.6 lb

## 2024-12-14 DIAGNOSIS — E1169 Type 2 diabetes mellitus with other specified complication: Secondary | ICD-10-CM

## 2024-12-14 DIAGNOSIS — E66811 Obesity, class 1: Secondary | ICD-10-CM

## 2024-12-14 DIAGNOSIS — Z0001 Encounter for general adult medical examination with abnormal findings: Secondary | ICD-10-CM | POA: Diagnosis not present

## 2024-12-14 DIAGNOSIS — E782 Mixed hyperlipidemia: Secondary | ICD-10-CM

## 2024-12-14 DIAGNOSIS — Z0000 Encounter for general adult medical examination without abnormal findings: Secondary | ICD-10-CM | POA: Insufficient documentation

## 2024-12-14 DIAGNOSIS — J302 Other seasonal allergic rhinitis: Secondary | ICD-10-CM | POA: Insufficient documentation

## 2024-12-14 DIAGNOSIS — Z1211 Encounter for screening for malignant neoplasm of colon: Secondary | ICD-10-CM | POA: Diagnosis not present

## 2024-12-14 DIAGNOSIS — I152 Hypertension secondary to endocrine disorders: Secondary | ICD-10-CM | POA: Diagnosis not present

## 2024-12-14 DIAGNOSIS — E1159 Type 2 diabetes mellitus with other circulatory complications: Secondary | ICD-10-CM | POA: Diagnosis not present

## 2024-12-14 DIAGNOSIS — E6609 Other obesity due to excess calories: Secondary | ICD-10-CM | POA: Insufficient documentation

## 2024-12-14 DIAGNOSIS — Z6834 Body mass index (BMI) 34.0-34.9, adult: Secondary | ICD-10-CM | POA: Diagnosis not present

## 2024-12-14 DIAGNOSIS — E119 Type 2 diabetes mellitus without complications: Secondary | ICD-10-CM

## 2024-12-14 DIAGNOSIS — Z794 Long term (current) use of insulin: Secondary | ICD-10-CM

## 2024-12-14 DIAGNOSIS — E559 Vitamin D deficiency, unspecified: Secondary | ICD-10-CM

## 2024-12-14 MED ORDER — AZELASTINE HCL 0.1 % NA SOLN: 1.0000 | Freq: Two times a day (BID) | NASAL | 12 refills | Status: AC

## 2024-12-14 NOTE — Progress Notes (Signed)
 Established Patient Office Visit  Subjective:  Patient ID: [name], male    DOB: [birth year]  Age: 78 y.o. MRN: [id number]  Chief Complaint  Patient presents with   Annual Exam    AWV    Patient is here today for his Medicare AWV. He reports feeling fairly well at this time.  Patients BP is very high today. He reports 130-150/70-80 at home. He had insurance home wellness check and his BP was 130/80s at that time. Patient recently had blood work completed. Discussed in detail today. HbgA1c was improved to 6.6% Continue medications as prescribed. Reinforced healthy diet and staying hydrated.  His last colonoscopy was 10/2017. It was recommended 5 year FU. Discussed options with patient and will order cologuard at this time.  Patient has hx of hypercalcemia. When previously check PTH was within normal range. He reports using tums on occasion and taking senior mens multi-vitamin. Advised patient to stop taking tums and switch to pepcid  as needed. He will start taking vitamin D  and B12 daily instead of multivitamin. Will have him come back in to recheck his BMP at that to FU on his calcium  before sending in Endocrinology referral.  He reports hydrochlorothiazide  12.5 mg 3 times a week did not reduce his dizziness so he stopped taking it all together. He denies any BLE edema. Upon exam patient has bilateral middle ear effusion which can contribute to dizziness. Educated patient on proper nasal spray use as patient reports Flonase  would run down the back of his throat when he administered it. He reports taking Zyrtec  daily but only using Flonase  as needed. Recommend patient start using Flonase  nasal spray daily and add on Astelin  nose spray at this time.  PHQ-9 score 1; GAD-7 score 1; 6CIT score 8.      No other concerns at this time.   Past Medical History:  Diagnosis Date   Abdominal wall hernia    a.) large; contained portions of the liver, RIGHT colon, and mid-distal small  bowel   Aortic atherosclerosis    Arthritis    Basal cell carcinoma of skin    Bifascicular block (RBBB + LPFB)    Bilateral carotid artery stenosis    a.) s/p PTA 08/26/2021 - 10 x 8 x 40 Exact stent RICA   Bladder tumor    BPH (benign prostatic hyperplasia)    Cerebral microvascular disease    Cervical spondylosis    Chronic midline thoracic back pain    Coronary artery disease    Diabetic polyneuropathy (HCC)    Diverticulosis    HFrEF (heart failure with reduced ejection fraction) (HCC)    History of 2019 novel coronavirus disease (COVID-19) 09/10/2020   a.) Tx'd with casirivimab/irdevimab infusion on 09/11/2020   History of kidney stones    HOH (hard of hearing)    Hypertension    Long-term use of aspirin  therapy    Lumbago of lumbar region with sciatica    Obesity    Pulmonary emphysema (HCC)    Renal cell cancer (HCC)    a.) s/p RIGHT nephrectomy   Spondylosis of thoracolumbar spine    T2DM (type 2 diabetes mellitus) (HCC)    Throat cancer (HCC)    Tremor     Past Surgical History:  Procedure Laterality Date   APPENDECTOMY     BACK SURGERY     BLADDER INSTILLATION N/A 07/21/2024   Procedure: INSTILLATION, BLADDER;  Surgeon: Francisca Redell BROCKS, MD;  Location: [name] ORS;  Service: Urology;  Laterality: N/A;  GEMCITABINE    BLADDER SURGERY     CAROTID PTA/STENT INTERVENTION Right 08/26/2021   Procedure: CAROTID PTA/STENT INTERVENTION;  Surgeon: Jama Cordella MATSU, MD;  Location: [name] IN[facility];  Service: Cardiovascular;  Laterality: Right;   CATARACT EXTRACTION W/PHACO Right 01/21/2023   Procedure: CATARACT EXTRACTION PHACO AND INTRAOCULAR LENS PLACEMENT (IOC) RIGHT DIABETIC  4.89  00:45.6;  Surgeon: Enola Feliciano Hugger, MD;  Location: [facility];  Service: Ophthalmology;  Laterality: Right;  Diabetic   CERVICAL SPINE SURGERY     CHOLECYSTECTOMY N/A 03/23/2018   Procedure: LAPAROSCOPIC CHOLECYSTECTOMY;  Surgeon: Rodolph Romano, MD;  Location: [name]  ORS;  Service: General;  Laterality: N/A;   COLONOSCOPY     COLONOSCOPY WITH PROPOFOL  N/A 11/10/2017   Procedure: COLONOSCOPY WITH PROPOFOL ;  Surgeon: Toledo, Ladell POUR, MD;  Location: [name] END[facility];  Service: Gastroenterology;  Laterality: N/A;   CYSTOSCOPY W/ RETROGRADES Bilateral 07/21/2024   Procedure: CYSTOSCOPY, WITH RETROGRADE PYELOGRAM;  Surgeon: Francisca Redell BROCKS, MD;  Location: [name] OR[facility];  Service: Urology;  Laterality: Bilateral;   CYSTOSCOPY WITH BIOPSY N/A 07/21/2024   Procedure: CYSTOSCOPY, WITH BIOPSY;  Surgeon: Francisca Redell BROCKS, MD;  Location: [name];  Service: Urology;  Laterality: N/A;   ESOPHAGOGASTRODUODENOSCOPY (EGD) WITH PROPOFOL  N/A 11/10/2017   Procedure: ESOPHAGOGASTRODUODENOSCOPY (EGD) WITH PROPOFOL ;  Surgeon: Toledo, Ladell POUR, MD;  Location: [name] END[facility];  Service: Gastroenterology;  Laterality: N/A;   HERNIA REPAIR     left od surgery     MICROLARYNGOSCOPY N/A 11/05/2020   Procedure: MICROLARYNGOSCOPY WITH BIOPSIES;  Surgeon: Blair Mt, MD;  Location: [facility];  Service: ENT;  Laterality: N/A;   NEPHRECTOMY     right kidney nephrectomy   rt elbow surgery     THROAT SURGERY  1990   for cancer removal at [facility]   TRANSURETHRAL RESECTION OF BLADDER TUMOR  07/21/2024   Procedure: TURBT (TRANSURETHRAL RESECTION OF BLADDER TUMOR);  Surgeon: Francisca Redell BROCKS, MD;  Location: [name] OR[facility];  Service: Urology;;   TRANSURETHRAL RESECTION OF BLADDER TUMOR N/A 09/04/2024   Procedure: TURBT (TRANSURETHRAL RESECTION OF BLADDER TUMOR);  Surgeon: Francisca Redell BROCKS, MD;  Location: [name] OR[facility];  Service: Urology;  Laterality: N/A;    Social History   Socioeconomic History   Marital status: Married    Spouse name: [name],Martin L (Spouse)   Number of children: Not on file   Years of education: Not on file   Highest education level: Not on file  Occupational History   Not on file  Tobacco Use   Smoking status: Former    Current packs/day: 0.00    Types:  Cigarettes    Quit date: 44    Years since quitting: 34.9   Smokeless tobacco: Never  Vaping Use   Vaping status: Never Used  Substance and Sexual Activity   Alcohol use: Not Currently    Comment: Occasional beer drinker   Drug use: No   Sexual activity: Yes  Other Topics Concern   Not on file  Social History Narrative   Lives at home with wife, active and independent at baseline   Social Drivers of Health   Tobacco Use: Medium Risk (12/14/2024)   Patient History    Smoking Tobacco Use: Former    Smokeless Tobacco Use: Never    Passive Exposure: Not on file  Financial Resource Strain: Low Risk  (07/05/2024)   Received from [facility]   Overall Financial Resource Strain (CARDIA)    Difficulty of Paying Living  Expenses: Not hard at all  Food Insecurity: No Food Insecurity (07/05/2024)   Received from [facility]   Epic    Within the past 12 months, you worried that your food would run out before you got the money to buy more.: Never true    Within the past 12 months, the food you bought just didn't last and you didn't have money to get more.: Never true  Transportation Needs: No Transportation Needs (07/05/2024)   Received from [facility] - Transportation    In the past 12 months, has lack of transportation kept you from medical appointments or from getting medications?: No    Lack of Transportation (Non-Medical): No  Physical Activity: Not on file  Stress: Not on file  Social Connections: Socially Integrated (04/26/2024)   Social Connection and Isolation Panel    Frequency of Communication with Friends and Family: More than three times a week    Frequency of Social Gatherings with Friends and Family: Twice a week    Attends Religious Services: More than 4 times per year    Active Member of [employer] or Organizations: Yes    Attends Engineer, Structural: More than 4 times per year    Marital Status: Married   Catering Manager Violence: Not At Risk (04/26/2024)   Humiliation, Afraid, Rape, and Kick questionnaire    Fear of Current or Ex-Partner: No    Emotionally Abused: No    Physically Abused: No    Sexually Abused: No  Depression (PHQ2-9): Low Risk (12/14/2024)   Depression (PHQ2-9)    PHQ-2 Score: 1  Alcohol Screen: Not on file  Housing: Unknown (07/05/2024)   Received from [facility]   Epic    In the last 12 months, was there a time when you were not able to pay the mortgage or rent on time?: No    Number of Times Moved in the Last Year: Not on file    At any time in the past 12 months, were you homeless or living in a shelter (including now)?: No  Utilities: Not At Risk (07/05/2024)   Received from [facility]   Epic    In the past 12 months has the electric, gas, oil, or water  company threatened to shut off services in your home?: No  Health Literacy: Not on file    Family History  Problem Relation Age of Onset   Diabetes Brother    Hypertension Brother    CAD Brother    CAD Brother     Allergies[1]  Show/hide medication list[2]  Review of Systems  Constitutional: Negative.  Negative for chills, fever and malaise/fatigue.  HENT: Negative.  Negative for congestion and sore throat.   Eyes: Negative.  Negative for blurred vision and pain.  Respiratory: Negative.  Negative for cough and shortness of breath.   Cardiovascular: Negative.  Negative for chest pain, palpitations and leg swelling.  Gastrointestinal: Negative.  Negative for abdominal pain, blood in stool, constipation, diarrhea, heartburn, melena, nausea and vomiting.  Genitourinary: Negative.  Negative for dysuria, flank pain, frequency and urgency.  Musculoskeletal: Negative.  Negative for joint pain and myalgias.  Skin: Negative.   Neurological:  Positive for dizziness. Negative for tingling, sensory change, weakness and headaches.  Endo/Heme/Allergies: Negative.    Psychiatric/Behavioral: Negative.  Negative for depression and suicidal ideas. The patient is not nervous/anxious.        Objective:   BP (!) 170/84  Pulse 73   Ht 5' 6 (1.676 m)   Wt 214 lb 9.6 oz (97.3 kg)   SpO2 97%   BMI 34.64 kg/m   Vitals:   12/14/24 1401  BP: (!) 170/84  Pulse: 73  Height: 5' 6 (1.676 m)  Weight: 214 lb 9.6 oz (97.3 kg)  SpO2: 97%  BMI (Calculated): 34.65    Physical Exam Vitals and nursing note reviewed.  Constitutional:      General: He is not in acute distress.    Appearance: Normal appearance. He is not ill-appearing.  HENT:     Head: Normocephalic and atraumatic.     Right Ear: A middle ear effusion is present.     Left Ear: A middle ear effusion is present.     Nose: Nose normal.     Mouth/Throat:     Mouth: Mucous membranes are moist.     Pharynx: Oropharynx is clear.  Eyes:     Conjunctiva/sclera: Conjunctivae normal.     Pupils: Pupils are equal, round, and reactive to light.  Cardiovascular:     Rate and Rhythm: Normal rate and regular rhythm.     Pulses: Normal pulses.     Heart sounds: Normal heart sounds.  Pulmonary:     Effort: Pulmonary effort is normal.     Breath sounds: Normal breath sounds. No wheezing or rhonchi.  Abdominal:     General: Bowel sounds are normal. There is no distension.     Palpations: Abdomen is soft.     Tenderness: There is no abdominal tenderness.  Musculoskeletal:        General: Normal range of motion.     Cervical back: Normal range of motion and neck supple.     Right lower leg: No edema.     Left lower leg: No edema.  Skin:    General: Skin is warm and dry.     Capillary Refill: Capillary refill takes less than 2 seconds.  Neurological:     General: No focal deficit present.     Mental Status: He is alert and oriented to person, place, and time.     Sensory: No sensory deficit.     Motor: No weakness.  Psychiatric:        Mood and Affect: Mood normal.        Behavior: Behavior  normal.        Judgment: Judgment normal.      No results found for any visits on 12/14/24.  Recent Results (from the past 2160 hours)  Urinalysis, Complete     Status: Abnormal   Collection Time: 10/11/24 10:47 AM  Result Value Ref Range   Specific Gravity, UA 1.010 1.005 - 1.030   pH, UA 6.0 5.0 - 7.5   Color, UA Yellow Yellow   Appearance Ur Clear Clear   Leukocytes,UA Negative Negative   Protein,UA Negative Negative/Trace   Glucose, UA 3+ (A) Negative   Ketones, UA Negative Negative   RBC, UA Trace (A) Negative   Bilirubin, UA Negative Negative   Urobilinogen, Ur 1.0 0.2 - 1.0 mg/dL   Nitrite, UA Negative Negative   Microscopic Examination See below:     Comment: Microscopic was indicated and was performed.  Microscopic Examination     Status: Abnormal   Collection Time: 10/11/24 10:47 AM   Urine  Result Value Ref Range   WBC, UA 0-5 0 - 5 /hpf   RBC, Urine 0-2 0 - 2 /hpf   Epithelial Cells (non  renal) 0-10 0 - 10 /hpf   Mucus, UA Present (A) Not Estab.   Bacteria, UA Few None seen/Few  Urinalysis, Complete     Status: Abnormal   Collection Time: 10/18/24 11:26 AM  Result Value Ref Range   Specific Gravity, UA 1.010 1.005 - 1.030   pH, UA 6.0 5.0 - 7.5   Color, UA Yellow Yellow   Appearance Ur Clear Clear   Leukocytes,UA Negative Negative   Protein,UA Negative Negative/Trace   Glucose, UA 3+ (A) Negative   Ketones, UA Negative Negative   RBC, UA Trace (A) Negative   Bilirubin, UA Negative Negative   Urobilinogen, Ur 0.2 0.2 - 1.0 mg/dL   Nitrite, UA Negative Negative   Microscopic Examination See below:     Comment: Microscopic was indicated and was performed.  Microscopic Examination     Status: Abnormal   Collection Time: 10/18/24 11:26 AM   Urine  Result Value Ref Range   WBC, UA 6-10 (A) 0 - 5 /hpf   RBC, Urine 3-10 (A) 0 - 2 /hpf   Epithelial Cells (non renal) >10 (A) 0 - 10 /hpf   Bacteria, UA Few None seen/Few  Urinalysis, Complete     Status:  Abnormal   Collection Time: 10/25/24 10:51 AM  Result Value Ref Range   Specific Gravity, UA 1.015 1.005 - 1.030   pH, UA 6.0 5.0 - 7.5   Color, UA Yellow Yellow   Appearance Ur Clear Clear   Leukocytes,UA Negative Negative   Protein,UA Negative Negative/Trace   Glucose, UA 3+ (A) Negative   Ketones, UA Negative Negative   RBC, UA 1+ (A) Negative   Bilirubin, UA Negative Negative   Urobilinogen, Ur 0.2 0.2 - 1.0 mg/dL   Nitrite, UA Negative Negative   Microscopic Examination See below:     Comment: Microscopic was indicated and was performed.  Microscopic Examination     Status: Abnormal   Collection Time: 10/25/24 10:51 AM   Urine  Result Value Ref Range   WBC, UA 6-10 (A) 0 - 5 /hpf   RBC, Urine 11-30 (A) 0 - 2 /hpf   Epithelial Cells (non renal) 0-10 0 - 10 /hpf   Bacteria, UA Few None seen/Few  Urinalysis, Complete     Status: Abnormal   Collection Time: 11/01/24 10:42 AM  Result Value Ref Range   Specific Gravity, UA 1.010 1.005 - 1.030   pH, UA 7.0 5.0 - 7.5   Color, UA Orange Yellow   Appearance Ur Clear Clear   Leukocytes,UA Negative Negative   Protein,UA Trace Negative/Trace   Glucose, UA 3+ (A) Negative   Ketones, UA Negative Negative   RBC, UA Trace (A) Negative   Bilirubin, UA Negative Negative   Urobilinogen, Ur 2.0 (H) 0.2 - 1.0 mg/dL   Nitrite, UA Positive (A) Negative   Microscopic Examination See below:     Comment: Microscopic was indicated and was performed.  Microscopic Examination     Status: Abnormal   Collection Time: 11/01/24 10:42 AM   Urine  Result Value Ref Range   WBC, UA 0-5 0 - 5 /hpf   RBC, Urine 3-10 (A) 0 - 2 /hpf   Epithelial Cells (non renal) >10 (A) 0 - 10 /hpf   Mucus, UA Present (A) Not Estab.   Bacteria, UA Few None seen/Few  Urinalysis, Complete     Status: Abnormal   Collection Time: 11/08/24 11:13 AM  Result Value Ref Range   Specific Gravity, UA 1.015  1.005 - 1.030   pH, UA 6.5 5.0 - 7.5   Color, UA Yellow Yellow    Appearance Ur Slightly cloudy Clear   Leukocytes,UA Negative Negative   Protein,UA Negative Negative/Trace   Glucose, UA 3+ (A) Negative   Ketones, UA Negative Negative   RBC, UA 2+ (A) Negative   Bilirubin, UA Negative Negative   Urobilinogen, Ur 0.2 0.2 - 1.0 mg/dL   Nitrite, UA Negative Negative   Microscopic Examination See below:     Comment: Microscopic was indicated and was performed.  Microscopic Examination     Status: Abnormal   Collection Time: 11/08/24 11:13 AM   Urine  Result Value Ref Range   WBC, UA 6-10 (A) 0 - 5 /hpf   RBC, Urine >30 (A) 0 - 2 /hpf   Epithelial Cells (non renal) >10 (A) 0 - 10 /hpf   Bacteria, UA Few None seen/Few  CULTURE, URINE COMPREHENSIVE     Status: Abnormal   Collection Time: 11/08/24  1:04 PM   Specimen: Urine   UR  Result Value Ref Range   Urine Culture, Comprehensive Final report (A)    Organism ID, Bacteria Enterococcus faecalis (A)     Comment: Enterococci susceptible to penicillin are predictably susceptible to ampicillin, amoxicillin, ampicillin-sulbactam, amoxicillin-clavulanate, and piperacillin -tazobactam for non-beta-lactamase producing enterococci. (CLSI 2018) For Enterococcus species, aminoglycosides (except for high-level resistance screening), cephalosporins, clindamycin, and trimethoprim-sulfamethoxazole are not effective clinically. (CLSI, M100-S26, 2016) 3,000 Colonies/mL    Organism ID, Bacteria Comment     Comment: Mixed urogenital flora 1,000 Colonies/mL    ANTIMICROBIAL SUSCEPTIBILITY Comment     Comment:       ** S = Susceptible; I = Intermediate; R = Resistant **                    P = Positive; N = Negative             MICS are expressed in micrograms per mL    Antibiotic                 RSLT#1    RSLT#2    RSLT#3    RSLT#4 Ciprofloxacin                   S Levofloxacin                   S Nitrofurantoin                  S Penicillin                     S Tetracycline                   R Vancomycin                      S   POCT CBG (Fasting - Glucose)     Status: Abnormal   Collection Time: 11/14/24  9:56 AM  Result Value Ref Range   Glucose Fasting, POC 166 (A) 70 - 99 mg/dL  Urinalysis, Complete     Status: Abnormal   Collection Time: 11/15/24 10:30 AM  Result Value Ref Range   Specific Gravity, UA 1.015 1.005 - 1.030   pH, UA 6.0 5.0 - 7.5   Color, UA Yellow Yellow   Appearance Ur Slightly cloudy Clear   Leukocytes,UA Negative Negative   Protein,UA Trace Negative/Trace   Glucose, UA 3+ (A) Negative  Ketones, UA Negative Negative   RBC, UA 3+ (A) Negative   Bilirubin, UA Negative Negative   Urobilinogen, Ur 1.0 0.2 - 1.0 mg/dL   Nitrite, UA Negative Negative   Microscopic Examination See below:     Comment: Microscopic was indicated and was performed.  Microscopic Examination     Status: Abnormal   Collection Time: 11/15/24 10:30 AM   Urine  Result Value Ref Range   WBC, UA 0-5 0 - 5 /hpf   RBC, Urine >30 (A) 0 - 2 /hpf   Epithelial Cells (non renal) 0-10 0 - 10 /hpf   Bacteria, UA Few None seen/Few  CMP14+EGFR     Status: Abnormal   Collection Time: 12/12/24  8:42 AM  Result Value Ref Range   Glucose 157 (H) 70 - 99 mg/dL   BUN 17 8 - 27 mg/dL   Creatinine, Ser 8.81 0.76 - 1.27 mg/dL   eGFR 64 >40 fO/fpw/8.26   BUN/Creatinine Ratio 14 10 - 24   Sodium 141 134 - 144 mmol/L   Potassium 4.5 3.5 - 5.2 mmol/L    Comment: Specimen received hemolyzed. Value may be increased by hemolysis. Clinical correlation indicated.    Chloride 100 96 - 106 mmol/L   CO2 22 20 - 29 mmol/L   Calcium  11.5 (H) 8.6 - 10.2 mg/dL   Total Protein 7.1 6.0 - 8.5 g/dL   Albumin 4.3 3.8 - 4.8 g/dL   Globulin, Total 2.8 1.5 - 4.5 g/dL   Bilirubin Total 0.6 0.0 - 1.2 mg/dL   Alkaline Phosphatase 64 47 - 123 IU/L   AST 30 0 - 40 IU/L   ALT 18 0 - 44 IU/L  Lipid panel     Status: None   Collection Time: 12/12/24  8:42 AM  Result Value Ref Range   Cholesterol, Total 135 100 - 199 mg/dL    Triglycerides 859 0 - 149 mg/dL   HDL 45 >60 mg/dL   VLDL Cholesterol Cal 24 5 - 40 mg/dL   LDL Chol Calc (NIH) 66 0 - 99 mg/dL   Chol/HDL Ratio 3.0 0.0 - 5.0 ratio    Comment:                                   T. Chol/HDL Ratio                                             Men  Women                               1/2 Avg.Risk  3.4    3.3                                   Avg.Risk  5.0    4.4                                2X Avg.Risk  9.6    7.1  3X Avg.Risk 23.4   11.0   Hemoglobin A1c     Status: Abnormal   Collection Time: 12/12/24  8:42 AM  Result Value Ref Range   Hgb A1c MFr Bld 6.6 (H) 4.8 - 5.6 %    Comment:          Prediabetes: 5.7 - 6.4          Diabetes: >6.4          Glycemic control for adults with diabetes: <7.0    Est. average glucose Bld gHb Est-mCnc 143 mg/dL  TSH     Status: None   Collection Time: 12/12/24  8:42 AM  Result Value Ref Range   TSH 1.980 0.450 - 4.500 uIU/mL      Assessment & Plan:  Start using Flonase  nasal spray daily. Add on Astelin  nasal spray twice daily. Continue other medications as prescribed. Stop tums and multivitamin to reduce extra calcium  intake. Recheck BMP when he returns with vitamin D  and send endocrinology referral as needed. Reinforced healthy diet and staying hydrated. Patient due for colon cancer screening will order cologuard. Problem List Items Addressed This Visit       Cardiovascular and Mediastinum   Hypertension associated with diabetes (HCC)     Respiratory   Seasonal allergic rhinitis   Relevant Medications   azelastine  (ASTELIN ) 0.1 % nasal spray     Endocrine   Insulin  dependent type 2 diabetes mellitus (HCC)   Combined hyperlipidemia associated with type 2 diabetes mellitus (HCC)     Other   Vitamin D  deficiency   Relevant Orders   Vitamin D  (25 hydroxy)   Hypercalcemia   Relevant Orders   Basic metabolic panel with GFR   Colon cancer screening   Relevant Orders    Cologuard   Class 1 obesity due to excess calories with serious comorbidity and body mass index (BMI) of 34.0 to 34.9 in adult   Medicare annual wellness visit, subsequent - Primary    Return in about 2 weeks (around 12/28/2024).   Total time spent: 30 minutes. This time includes review of previous notes and results and patient face to face interaction during today's visit.    FERNAND FREDY RAMAN, MD  12/14/2024   This document may have been prepared by [facility] Voice Recognition software and as such may include unintentional dictation errors.      [1]  Allergies Allergen Reactions   Metformin Diarrhea  [2]  Outpatient Medications Prior to Visit  Medication Sig   acetaminophen  (TYLENOL ) 500 MG tablet Take 500 mg by mouth every 6 (six) hours as needed for moderate pain (pain score 4-6).   amitriptyline  (ELAVIL ) 25 MG tablet TAKE 2 TABLETS BY MOUTH AT BEDTIME   aspirin  EC 81 MG tablet Take 81 mg by mouth daily. Swallow whole.   baclofen  (LIORESAL ) 10 MG tablet Take 1 tablet (10 mg total) by mouth at bedtime.   benzonatate (TESSALON) 100 MG capsule Take 1 capsule by mouth three times daily as needed for cough   carvedilol  (COREG ) 6.25 MG tablet Take 1 tablet (6.25 mg total) by mouth 2 (two) times daily.   cetirizine  (ZYRTEC  ALLERGY) 10 MG tablet Take 1 tablet (10 mg total) by mouth daily.   Cholecalciferol  50 MCG (2000 UT) CAPS Take 2,000 Units by mouth daily.   cyanocobalamin  (VITAMIN B12) 500 MCG tablet Take 500 mcg by mouth daily.   empagliflozin  (JARDIANCE ) 25 MG TABS tablet Take 25 mg by mouth daily.   fluorouracil (EFUDEX) 5 %  cream Apply topically. Apply topically two (2) times a day. Use for 4 weeks   fluticasone  (FLONASE ) 50 MCG/ACT nasal spray Use 2 spray(s) in each nostril once daily   glimepiride  (AMARYL ) 2 MG tablet Take 1 tablet by mouth twice daily   insulin  NPH-regular Human (70-30) 100 UNIT/ML injection Inject 37 Units into the skin 2 (two) times daily with a meal.    Menthol, Topical Analgesic, (BIOFREEZE COOL THE PAIN) 5 % PTCH Place 1 patch onto the skin daily as needed (pain).   ONETOUCH ULTRA BLUE TEST test strip USE TO CHECK BLOOD SUAGR THREE TIMES DAILY   primidone  (MYSOLINE ) 50 MG tablet Take 1 tablet (50 mg total) by mouth in the morning.   rosuvastatin  (CRESTOR ) 40 MG tablet TAKE 1 TABLET BY MOUTH ONCE DAILY REPLACES  ATORVASTATIN   tamsulosin  (FLOMAX ) 0.4 MG CAPS capsule TAKE 1 CAPSULE BY MOUTH ONCE DAILY AFTER BREAKFAST   Vibegron  (GEMTESA ) 75 MG TABS Take 1 tablet (75 mg total) by mouth daily.   hydrochlorothiazide  (MICROZIDE ) 12.5 MG capsule Take 1 tablet Monday, Wednesday, Fridays only. (Patient not taking: Reported on 12/14/2024)   Facility-Administered Medications Prior to Visit  Medication Dose Route Frequency Provider   technetium sestamibi generic (CARDIOLITE ) injection 10 millicurie  10 millicurie Intravenous Once PRN Fernand Denyse LABOR, MD

## 2024-12-19 ENCOUNTER — Ambulatory Visit: Admitting: Urology

## 2024-12-19 VITALS — BP 152/79 | HR 73 | Wt 208.0 lb

## 2024-12-19 DIAGNOSIS — D494 Neoplasm of unspecified behavior of bladder: Secondary | ICD-10-CM | POA: Diagnosis not present

## 2024-12-19 MED ORDER — NITROFURANTOIN MONOHYD MACRO 100 MG PO CAPS
100.0000 mg | ORAL_CAPSULE | Freq: Once | ORAL | Status: AC
  Administered 2024-12-19: 100 mg via ORAL

## 2024-12-19 MED ORDER — LIDOCAINE HCL URETHRAL/MUCOSAL 2 % EX GEL
1.0000 | Freq: Once | CUTANEOUS | Status: AC
  Administered 2024-12-19: 1 via URETHRAL

## 2024-12-19 NOTE — Progress Notes (Signed)
 Cystoscopy Procedure Note:  Indication: History of bladder cancer  Comorbid and frail M with distant history of right upper tract urothelial cell carcinoma treated with nephro ureterectomy by Dr. Ike 2006.  New bladder lesion July 2025 and underwent TURBT showing 2 cm papillary bladder tumor right posterior wall with pathology showing HG urothelial cell carcinoma with focal invasion of muscularis propria.  He deferred more definitive treatments with cystectomy or Tri modal therapy and opted for repeat TURBT and BCG.  Second look TURBT September 2025 showed residual HG T1 disease, muscle was present and not involved with tumor.  Completed induction BCG November 2025-> plan to continue at least 1 year maintenance  Nitrofurantoin  given for prophylaxis  After informed consent and discussion of the procedure and its risks, [name] was positioned and prepped in the standard fashion. Cystoscopy was performed with a flexible cystoscope.  Subtle wispy stricture proximal urethra easily bypassed with scope.  The urethra, bladder neck and entire bladder was visualized in a standard fashion. The prostate was moderate in size.  Scar tissue and fibrinous debris at prior resection site right posterior lateral wall, no evidence of papillary recurrence.  Some subtle erythema throughout the bladder but no definite papillary lesions.  Cytology sent.  Findings: No definite recurrence, mild erythema unclear if CIS/early recurrence versus BCG changes  Assessment and Plan: Follow-up cytology, if positive for suspicious recommend bladder biopsy and fulguration, if negative BCG times 31 January 2024 and continue every 3 month cystoscopy, likely repeat cross-sectional imaging prior to next visit  [name], MD 12/19/2024

## 2024-12-27 ENCOUNTER — Ambulatory Visit: Payer: Self-pay | Admitting: Urology

## 2024-12-27 LAB — "CYTOLOGY PLUS MONITORING PROFILE: PAP & FEULGEN "

## 2024-12-29 ENCOUNTER — Other Ambulatory Visit

## 2024-12-30 LAB — BASIC METABOLIC PANEL WITH GFR
BUN/Creatinine Ratio: 15 (ref 10–24)
BUN: 18 mg/dL (ref 8–27)
CO2: 24 mmol/L (ref 20–29)
Calcium: 11 mg/dL — ABNORMAL HIGH (ref 8.6–10.2)
Chloride: 104 mmol/L (ref 96–106)
Creatinine, Ser: 1.19 mg/dL (ref 0.76–1.27)
Glucose: 128 mg/dL — ABNORMAL HIGH (ref 70–99)
Potassium: 3.7 mmol/L (ref 3.5–5.2)
Sodium: 144 mmol/L (ref 134–144)
eGFR: 63 mL/min/1.73

## 2024-12-30 LAB — VITAMIN D 25 HYDROXY (VIT D DEFICIENCY, FRACTURES): Vit D, 25-Hydroxy: 41.6 ng/mL (ref 30.0–100.0)

## 2025-01-01 ENCOUNTER — Ambulatory Visit: Payer: Self-pay | Admitting: Internal Medicine

## 2025-01-02 ENCOUNTER — Encounter: Payer: Self-pay | Admitting: Internal Medicine

## 2025-01-02 ENCOUNTER — Ambulatory Visit: Payer: Self-pay | Admitting: Internal Medicine

## 2025-01-02 ENCOUNTER — Ambulatory Visit: Admitting: Internal Medicine

## 2025-01-02 VITALS — BP 130/80 | HR 66 | Ht 66.0 in | Wt 214.6 lb

## 2025-01-02 DIAGNOSIS — Z6834 Body mass index (BMI) 34.0-34.9, adult: Secondary | ICD-10-CM

## 2025-01-02 DIAGNOSIS — E1165 Type 2 diabetes mellitus with hyperglycemia: Secondary | ICD-10-CM

## 2025-01-02 DIAGNOSIS — E782 Mixed hyperlipidemia: Secondary | ICD-10-CM | POA: Diagnosis not present

## 2025-01-02 DIAGNOSIS — I152 Hypertension secondary to endocrine disorders: Secondary | ICD-10-CM | POA: Diagnosis not present

## 2025-01-02 DIAGNOSIS — J0100 Acute maxillary sinusitis, unspecified: Secondary | ICD-10-CM | POA: Diagnosis not present

## 2025-01-02 DIAGNOSIS — E1169 Type 2 diabetes mellitus with other specified complication: Secondary | ICD-10-CM | POA: Diagnosis not present

## 2025-01-02 DIAGNOSIS — E66811 Obesity, class 1: Secondary | ICD-10-CM | POA: Diagnosis not present

## 2025-01-02 DIAGNOSIS — Z794 Long term (current) use of insulin: Secondary | ICD-10-CM

## 2025-01-02 DIAGNOSIS — E1159 Type 2 diabetes mellitus with other circulatory complications: Secondary | ICD-10-CM | POA: Diagnosis not present

## 2025-01-02 DIAGNOSIS — E119 Type 2 diabetes mellitus without complications: Secondary | ICD-10-CM

## 2025-01-02 DIAGNOSIS — E6609 Other obesity due to excess calories: Secondary | ICD-10-CM | POA: Diagnosis not present

## 2025-01-02 LAB — POCT CBG (FASTING - GLUCOSE)-MANUAL ENTRY: Glucose Fasting, POC: 136 mg/dL — AB (ref 70–99)

## 2025-01-02 MED ORDER — AMOXICILLIN-POT CLAVULANATE 875-125 MG PO TABS: 1.0000 | ORAL_TABLET | Freq: Two times a day (BID) | ORAL | 0 refills | Status: AC

## 2025-01-02 NOTE — Progress Notes (Signed)
 "  Established Patient Office Visit  Subjective:  Patient ID: [name], male    DOB: Aug 25, 1946  Age: 79 y.o. MRN: [id number]  Chief Complaint  Patient presents with   Follow-up    2 week follow up    Patient is here today for follow up. He reports feeling fairly well today but has concerns he would like to discuss.  His calcium  level is still elevated but has come down to 11 when rechecked. He has had 2 normal PTH labs in the past, 2022 and 2024. Will order serum protein electrophoresis and send Endocrinology referral at this time. A referral was previously sent in 2022 but somehow patient did not see specialist at that time. Patient does have hx of bladder cancer and recently finished treatment and has hx of laryngeal cancer, right renal cancer, basal cell skin cancer. He reports he has stopped using tums and taking a multivitamin. He reports just taking B12 and vitamin D  supplements at this time. He reports eating 1 slice a cheese a day and denies drinking dairy products.  He reports dizziness has improved since adding on Astelin  nose spray. He reports still having nasal congestion that is worse on right side than the left and his sinus are tender to the touch. Reports sore throat and cough at bedtime. He reports taking OTC cold medicine and it has helped slightly but he continues to have symptoms. Will start Augmentin  for sinus infection. He reports symptoms have been ongoing for about a month. Denies fever or chills.     No other concerns at this time.   Past Medical History:  Diagnosis Date   Abdominal wall hernia    a.) large; contained portions of the liver, RIGHT colon, and mid-distal small bowel   Aortic atherosclerosis    Arthritis    Basal cell carcinoma of skin    Bifascicular block (RBBB + LPFB)    Bilateral carotid artery stenosis    a.) s/p PTA 08/26/2021 - 10 x 8 x 40 Exact stent RICA   Bladder tumor    BPH (benign prostatic hyperplasia)    Cerebral  microvascular disease    Cervical spondylosis    Chronic midline thoracic back pain    Coronary artery disease    Diabetic polyneuropathy (HCC)    Diverticulosis    HFrEF (heart failure with reduced ejection fraction) (HCC)    History of 2019 novel coronavirus disease (COVID-19) 09/10/2020   a.) Tx'd with casirivimab/irdevimab infusion on 09/11/2020   History of kidney stones    HOH (hard of hearing)    Hypertension    Long-term use of aspirin  therapy    Lumbago of lumbar region with sciatica    Obesity    Pulmonary emphysema (HCC)    Renal cell cancer (HCC)    a.) s/p RIGHT nephrectomy   Spondylosis of thoracolumbar spine    T2DM (type 2 diabetes mellitus) (HCC)    Throat cancer (HCC)    Tremor     Past Surgical History:  Procedure Laterality Date   APPENDECTOMY     BACK SURGERY     BLADDER INSTILLATION N/A 07/21/2024   Procedure: INSTILLATION, BLADDER;  Surgeon: Francisca Redell BROCKS, MD;  Location: [name];  Service: Urology;  Laterality: N/A;  GEMCITABINE    BLADDER SURGERY     CAROTID PTA/STENT INTERVENTION Right 08/26/2021   Procedure: CAROTID PTA/STENT INTERVENTION;  Surgeon: Jama Cordella KANDICE, MD;  Location: [name];  Service: Cardiovascular;  Laterality: Right;  CATARACT EXTRACTION W/PHACO Right 01/21/2023   Procedure: CATARACT EXTRACTION PHACO AND INTRAOCULAR LENS PLACEMENT (IOC) RIGHT DIABETIC  4.89  00:45.6;  Surgeon: Enola Feliciano Hugger, MD;  Location: [facility];  Service: Ophthalmology;  Laterality: Right;  Diabetic   CERVICAL SPINE SURGERY     CHOLECYSTECTOMY N/A 03/23/2018   Procedure: LAPAROSCOPIC CHOLECYSTECTOMY;  Surgeon: Rodolph Romano, MD;  Location: [name];  Service: General;  Laterality: N/A;   COLONOSCOPY     COLONOSCOPY WITH PROPOFOL  N/A 11/10/2017   Procedure: COLONOSCOPY WITH PROPOFOL ;  Surgeon: Toledo, Ladell POUR, MD;  Location: [name];  Service: Gastroenterology;  Laterality: N/A;   CYSTOSCOPY W/ RETROGRADES  Bilateral 07/21/2024   Procedure: CYSTOSCOPY, WITH RETROGRADE PYELOGRAM;  Surgeon: Francisca Redell BROCKS, MD;  Location: [name];  Service: Urology;  Laterality: Bilateral;   CYSTOSCOPY WITH BIOPSY N/A 07/21/2024   Procedure: CYSTOSCOPY, WITH BIOPSY;  Surgeon: Francisca Redell BROCKS, MD;  Location: [name];  Service: Urology;  Laterality: N/A;   ESOPHAGOGASTRODUODENOSCOPY (EGD) WITH PROPOFOL  N/A 11/10/2017   Procedure: ESOPHAGOGASTRODUODENOSCOPY (EGD) WITH PROPOFOL ;  Surgeon: Toledo, Ladell POUR, MD;  Location: [name];  Service: Gastroenterology;  Laterality: N/A;   HERNIA REPAIR     left od surgery     MICROLARYNGOSCOPY N/A 11/05/2020   Procedure: MICROLARYNGOSCOPY WITH BIOPSIES;  Surgeon: Blair Mt, MD;  Location: [facility];  Service: ENT;  Laterality: N/A;   NEPHRECTOMY     right kidney nephrectomy   rt elbow surgery     THROAT SURGERY  1990   for cancer removal at [facility]   TRANSURETHRAL RESECTION OF BLADDER TUMOR  07/21/2024   Procedure: TURBT (TRANSURETHRAL RESECTION OF BLADDER TUMOR);  Surgeon: Francisca Redell BROCKS, MD;  Location: [name];  Service: Urology;;   TRANSURETHRAL RESECTION OF BLADDER TUMOR N/A 09/04/2024   Procedure: TURBT (TRANSURETHRAL RESECTION OF BLADDER TUMOR);  Surgeon: Francisca Redell BROCKS, MD;  Location: [name];  Service: Urology;  Laterality: N/A;    Social History   Socioeconomic History   Marital status: Married    Spouse name: [name] (Spouse)   Number of children: Not on file   Years of education: Not on file   Highest education level: Not on file  Occupational History   Not on file  Tobacco Use   Smoking status: Former    Current packs/day: 0.00    Types: Cigarettes    Quit date: 9    Years since quitting: 35.0   Smokeless tobacco: Never  Vaping Use   Vaping status: Never Used  Substance and Sexual Activity   Alcohol use: Not Currently    Comment: Occasional beer drinker   Drug use: No   Sexual activity: Yes  Other Topics  Concern   Not on file  Social History Narrative   Lives at home with wife, active and independent at baseline   Social Drivers of Health   Tobacco Use: Medium Risk (01/02/2025)   Patient History    Smoking Tobacco Use: Former    Smokeless Tobacco Use: Never    Passive Exposure: Not on Actuary Strain: Low Risk  (07/05/2024)   Received from [facility]   Overall Financial Resource Strain (CARDIA)    Difficulty of Paying Living Expenses: Not hard at all  Food Insecurity: No Food Insecurity (07/05/2024)   Received from [facility]   Epic    Within the past 12 months, you worried that your food would run out before you got the  money to buy more.: Never true    Within the past 12 months, the food you bought just didn't last and you didn't have money to get more.: Never true  Transportation Needs: No Transportation Needs (07/05/2024)   Received from [facility] - Transportation    In the past 12 months, has lack of transportation kept you from medical appointments or from getting medications?: No    Lack of Transportation (Non-Medical): No  Physical Activity: Not on file  Stress: Not on file  Social Connections: Socially Integrated (04/26/2024)   Social Connection and Isolation Panel    Frequency of Communication with Friends and Family: More than three times a week    Frequency of Social Gatherings with Friends and Family: Twice a week    Attends Religious Services: More than 4 times per year    Active Member of [employer] or Organizations: Yes    Attends Engineer, Structural: More than 4 times per year    Marital Status: Married  Catering Manager Violence: Not At Risk (04/26/2024)   Humiliation, Afraid, Rape, and Kick questionnaire    Fear of Current or Ex-Partner: No    Emotionally Abused: No    Physically Abused: No    Sexually Abused: No  Depression (PHQ2-9): Low Risk (12/14/2024)   Depression (PHQ2-9)     PHQ-2 Score: 1  Alcohol Screen: Not on file  Housing: Unknown (07/05/2024)   Received from [facility]   Epic    In the last 12 months, was there a time when you were not able to pay the mortgage or rent on time?: No    Number of Times Moved in the Last Year: Not on file    At any time in the past 12 months, were you homeless or living in a shelter (including now)?: No  Utilities: Not At Risk (07/05/2024)   Received from [facility]   Epic    In the past 12 months has the electric, gas, oil, or water  company threatened to shut off services in your home?: No  Health Literacy: Not on file    Family History  Problem Relation Age of Onset   Diabetes Brother    Hypertension Brother    CAD Brother    CAD Brother     Allergies[1]  Show/hide medication list[2]  Review of Systems  Constitutional: Negative.  Negative for chills, fever and malaise/fatigue.  HENT:  Positive for congestion, sinus pain and sore throat.   Eyes: Negative.  Negative for blurred vision and pain.  Respiratory:  Positive for cough (at night) and sputum production. Negative for shortness of breath.   Cardiovascular: Negative.  Negative for chest pain, palpitations and leg swelling.  Gastrointestinal: Negative.  Negative for abdominal pain, blood in stool, constipation, diarrhea, heartburn, melena, nausea and vomiting.  Genitourinary: Negative.  Negative for dysuria, flank pain, frequency and urgency.  Musculoskeletal:  Positive for myalgias (bilateral feet pain with walking). Negative for joint pain.  Skin: Negative.   Neurological: Negative.  Negative for dizziness, tingling, sensory change, weakness and headaches.  Endo/Heme/Allergies: Negative.   Psychiatric/Behavioral: Negative.  Negative for depression and suicidal ideas. The patient is not nervous/anxious.        Objective:   BP 130/80   Pulse 66   Ht 5' 6 (1.676 m)   Wt 214 lb 9.6 oz (97.3 kg)   SpO2 98%   BMI  34.64 kg/m   Vitals:  01/02/25 1034  BP: 130/80  Pulse: 66  Height: 5' 6 (1.676 m)  Weight: 214 lb 9.6 oz (97.3 kg)  SpO2: 98%  BMI (Calculated): 34.65    Physical Exam Vitals and nursing note reviewed.  Constitutional:      General: He is not in acute distress.    Appearance: Normal appearance. He is not ill-appearing.  HENT:     Head: Normocephalic and atraumatic.     Right Ear: A middle ear effusion is present.     Left Ear: A middle ear effusion is present.     Ears:     Comments: improving    Nose: Congestion and rhinorrhea present.     Right Sinus: Maxillary sinus tenderness present.     Mouth/Throat:     Mouth: Mucous membranes are moist.     Pharynx: Oropharynx is clear.  Eyes:     Conjunctiva/sclera: Conjunctivae normal.     Pupils: Pupils are equal, round, and reactive to light.  Cardiovascular:     Rate and Rhythm: Normal rate and regular rhythm.     Pulses: Normal pulses.     Heart sounds: Normal heart sounds.  Pulmonary:     Effort: Pulmonary effort is normal.     Breath sounds: Normal breath sounds. No wheezing or rhonchi.  Abdominal:     General: Bowel sounds are normal. There is no distension.     Palpations: Abdomen is soft.     Tenderness: There is no abdominal tenderness.  Musculoskeletal:        General: Normal range of motion.     Cervical back: Normal range of motion and neck supple.     Right lower leg: No edema.     Left lower leg: No edema.  Skin:    General: Skin is warm and dry.     Capillary Refill: Capillary refill takes less than 2 seconds.  Neurological:     General: No focal deficit present.     Mental Status: He is alert and oriented to person, place, and time.     Sensory: No sensory deficit.     Motor: No weakness.  Psychiatric:        Mood and Affect: Mood normal.        Behavior: Behavior normal.        Judgment: Judgment normal.      Results for orders placed or performed in visit on 01/02/25  POCT CBG (Fasting -  Glucose)  Result Value Ref Range   Glucose Fasting, POC 136 (A) 70 - 99 mg/dL    Recent Results (from the past 2160 hours)  Urinalysis, Complete     Status: Abnormal   Collection Time: 10/11/24 10:47 AM  Result Value Ref Range   Specific Gravity, UA 1.010 1.005 - 1.030   pH, UA 6.0 5.0 - 7.5   Color, UA Yellow Yellow   Appearance Ur Clear Clear   Leukocytes,UA Negative Negative   Protein,UA Negative Negative/Trace   Glucose, UA 3+ (A) Negative   Ketones, UA Negative Negative   RBC, UA Trace (A) Negative   Bilirubin, UA Negative Negative   Urobilinogen, Ur 1.0 0.2 - 1.0 mg/dL   Nitrite, UA Negative Negative   Microscopic Examination See below:     Comment: Microscopic was indicated and was performed.  Microscopic Examination     Status: Abnormal   Collection Time: 10/11/24 10:47 AM   Urine  Result Value Ref Range   WBC, UA 0-5 0 - 5 /  hpf   RBC, Urine 0-2 0 - 2 /hpf   Epithelial Cells (non renal) 0-10 0 - 10 /hpf   Mucus, UA Present (A) Not Estab.   Bacteria, UA Few None seen/Few  Urinalysis, Complete     Status: Abnormal   Collection Time: 10/18/24 11:26 AM  Result Value Ref Range   Specific Gravity, UA 1.010 1.005 - 1.030   pH, UA 6.0 5.0 - 7.5   Color, UA Yellow Yellow   Appearance Ur Clear Clear   Leukocytes,UA Negative Negative   Protein,UA Negative Negative/Trace   Glucose, UA 3+ (A) Negative   Ketones, UA Negative Negative   RBC, UA Trace (A) Negative   Bilirubin, UA Negative Negative   Urobilinogen, Ur 0.2 0.2 - 1.0 mg/dL   Nitrite, UA Negative Negative   Microscopic Examination See below:     Comment: Microscopic was indicated and was performed.  Microscopic Examination     Status: Abnormal   Collection Time: 10/18/24 11:26 AM   Urine  Result Value Ref Range   WBC, UA 6-10 (A) 0 - 5 /hpf   RBC, Urine 3-10 (A) 0 - 2 /hpf   Epithelial Cells (non renal) >10 (A) 0 - 10 /hpf   Bacteria, UA Few None seen/Few  Urinalysis, Complete     Status: Abnormal    Collection Time: 10/25/24 10:51 AM  Result Value Ref Range   Specific Gravity, UA 1.015 1.005 - 1.030   pH, UA 6.0 5.0 - 7.5   Color, UA Yellow Yellow   Appearance Ur Clear Clear   Leukocytes,UA Negative Negative   Protein,UA Negative Negative/Trace   Glucose, UA 3+ (A) Negative   Ketones, UA Negative Negative   RBC, UA 1+ (A) Negative   Bilirubin, UA Negative Negative   Urobilinogen, Ur 0.2 0.2 - 1.0 mg/dL   Nitrite, UA Negative Negative   Microscopic Examination See below:     Comment: Microscopic was indicated and was performed.  Microscopic Examination     Status: Abnormal   Collection Time: 10/25/24 10:51 AM   Urine  Result Value Ref Range   WBC, UA 6-10 (A) 0 - 5 /hpf   RBC, Urine 11-30 (A) 0 - 2 /hpf   Epithelial Cells (non renal) 0-10 0 - 10 /hpf   Bacteria, UA Few None seen/Few  Urinalysis, Complete     Status: Abnormal   Collection Time: 11/01/24 10:42 AM  Result Value Ref Range   Specific Gravity, UA 1.010 1.005 - 1.030   pH, UA 7.0 5.0 - 7.5   Color, UA Orange Yellow   Appearance Ur Clear Clear   Leukocytes,UA Negative Negative   Protein,UA Trace Negative/Trace   Glucose, UA 3+ (A) Negative   Ketones, UA Negative Negative   RBC, UA Trace (A) Negative   Bilirubin, UA Negative Negative   Urobilinogen, Ur 2.0 (H) 0.2 - 1.0 mg/dL   Nitrite, UA Positive (A) Negative   Microscopic Examination See below:     Comment: Microscopic was indicated and was performed.  Microscopic Examination     Status: Abnormal   Collection Time: 11/01/24 10:42 AM   Urine  Result Value Ref Range   WBC, UA 0-5 0 - 5 /hpf   RBC, Urine 3-10 (A) 0 - 2 /hpf   Epithelial Cells (non renal) >10 (A) 0 - 10 /hpf   Mucus, UA Present (A) Not Estab.   Bacteria, UA Few None seen/Few  Urinalysis, Complete     Status: Abnormal   Collection  Time: 11/08/24 11:13 AM  Result Value Ref Range   Specific Gravity, UA 1.015 1.005 - 1.030   pH, UA 6.5 5.0 - 7.5   Color, UA Yellow Yellow   Appearance Ur  Slightly cloudy Clear   Leukocytes,UA Negative Negative   Protein,UA Negative Negative/Trace   Glucose, UA 3+ (A) Negative   Ketones, UA Negative Negative   RBC, UA 2+ (A) Negative   Bilirubin, UA Negative Negative   Urobilinogen, Ur 0.2 0.2 - 1.0 mg/dL   Nitrite, UA Negative Negative   Microscopic Examination See below:     Comment: Microscopic was indicated and was performed.  Microscopic Examination     Status: Abnormal   Collection Time: 11/08/24 11:13 AM   Urine  Result Value Ref Range   WBC, UA 6-10 (A) 0 - 5 /hpf   RBC, Urine >30 (A) 0 - 2 /hpf   Epithelial Cells (non renal) >10 (A) 0 - 10 /hpf   Bacteria, UA Few None seen/Few  CULTURE, URINE COMPREHENSIVE     Status: Abnormal   Collection Time: 11/08/24  1:04 PM   Specimen: Urine   UR  Result Value Ref Range   Urine Culture, Comprehensive Final report (A)    Organism ID, Bacteria Enterococcus faecalis (A)     Comment: Enterococci susceptible to penicillin are predictably susceptible to ampicillin, amoxicillin , ampicillin-sulbactam, amoxicillin -clavulanate, and piperacillin -tazobactam for non-beta-lactamase producing enterococci. (CLSI 2018) For Enterococcus species, aminoglycosides (except for high-level resistance screening), cephalosporins, clindamycin, and trimethoprim-sulfamethoxazole are not effective clinically. (CLSI, M100-S26, 2016) 3,000 Colonies/mL    Organism ID, Bacteria Comment     Comment: Mixed urogenital flora 1,000 Colonies/mL    ANTIMICROBIAL SUSCEPTIBILITY Comment     Comment:       ** S = Susceptible; I = Intermediate; R = Resistant **                    P = Positive; N = Negative             MICS are expressed in micrograms per mL    Antibiotic                 RSLT#1    RSLT#2    RSLT#3    RSLT#4 Ciprofloxacin                   S Levofloxacin                   S Nitrofurantoin                  S Penicillin                     S Tetracycline                   R Vancomycin                      S   POCT CBG (Fasting - Glucose)     Status: Abnormal   Collection Time: 11/14/24  9:56 AM  Result Value Ref Range   Glucose Fasting, POC 166 (A) 70 - 99 mg/dL  Urinalysis, Complete     Status: Abnormal   Collection Time: 11/15/24 10:30 AM  Result Value Ref Range   Specific Gravity, UA 1.015 1.005 - 1.030   pH, UA 6.0 5.0 - 7.5   Color, UA Yellow Yellow   Appearance Ur Slightly cloudy Clear   Leukocytes,UA  Negative Negative   Protein,UA Trace Negative/Trace   Glucose, UA 3+ (A) Negative   Ketones, UA Negative Negative   RBC, UA 3+ (A) Negative   Bilirubin, UA Negative Negative   Urobilinogen, Ur 1.0 0.2 - 1.0 mg/dL   Nitrite, UA Negative Negative   Microscopic Examination See below:     Comment: Microscopic was indicated and was performed.  Microscopic Examination     Status: Abnormal   Collection Time: 11/15/24 10:30 AM   Urine  Result Value Ref Range   WBC, UA 0-5 0 - 5 /hpf   RBC, Urine >30 (A) 0 - 2 /hpf   Epithelial Cells (non renal) 0-10 0 - 10 /hpf   Bacteria, UA Few None seen/Few  CMP14+EGFR     Status: Abnormal   Collection Time: 12/12/24  8:42 AM  Result Value Ref Range   Glucose 157 (H) 70 - 99 mg/dL   BUN 17 8 - 27 mg/dL   Creatinine, Ser 8.81 0.76 - 1.27 mg/dL   eGFR 64 >40 fO/fpw/8.26   BUN/Creatinine Ratio 14 10 - 24   Sodium 141 134 - 144 mmol/[name]   Potassium 4.5 3.5 - 5.2 mmol/[name]    Comment: Specimen received hemolyzed. Value may be increased by hemolysis. Clinical correlation indicated.    Chloride 100 96 - 106 mmol/[name]   CO2 22 20 - 29 mmol/[name]   Calcium  11.5 (H) 8.6 - 10.2 mg/dL   Total Protein 7.1 6.0 - 8.5 g/dL   Albumin 4.3 3.8 - 4.8 g/dL   Globulin, Total 2.8 1.5 - 4.5 g/dL   Bilirubin Total 0.6 0.0 - 1.2 mg/dL   Alkaline Phosphatase 64 47 - 123 IU/[name]   AST 30 0 - 40 IU/[name]   ALT 18 0 - 44 IU/[name]  Lipid panel     Status: None   Collection Time: 12/12/24  8:42 AM  Result Value Ref Range   Cholesterol, Total 135 100 - 199 mg/dL   Triglycerides 859 0  - 149 mg/dL   HDL 45 >60 mg/dL   VLDL Cholesterol Cal 24 5 - 40 mg/dL   LDL Chol Calc (NIH) 66 0 - 99 mg/dL   Chol/HDL Ratio 3.0 0.0 - 5.0 ratio    Comment:                                   T. Chol/HDL Ratio                                             Men  Women                               1/2 Avg.Risk  3.4    3.3                                   Avg.Risk  5.0    4.4                                2X Avg.Risk  9.6    7.1  3X Avg.Risk 23.4   11.0   Hemoglobin A1c     Status: Abnormal   Collection Time: 12/12/24  8:42 AM  Result Value Ref Range   Hgb A1c MFr Bld 6.6 (H) 4.8 - 5.6 %    Comment:          Prediabetes: 5.7 - 6.4          Diabetes: >6.4          Glycemic control for adults with diabetes: <7.0    Est. average glucose Bld gHb Est-mCnc 143 mg/dL  TSH     Status: None   Collection Time: 12/12/24  8:42 AM  Result Value Ref Range   TSH 1.980 0.450 - 4.500 uIU/mL  Cytology Plus Monitoring Profile: Pap & Feulgen     Status: None   Collection Time: 12/19/24 12:00 AM  Result Value Ref Range   CYTOLOGY PLUS MONITORING PROFILE: PAP & FEULGEN Comment     Comment: REPORT STATUS: FINAL RESULTS:  SPECIMEN SOURCE: Bladder Wash  DIAGNOSIS: Negative for malignant cells or dysplasia.   MACROSCOPIC DESCRIPTION: Received is 70ml of light yellow, clear, fixed urine.  CPT CODES: 11887   CASE CLINICAL INFORMATION:  No clinical data specified No therapy  CASE CLINICAL HISTORY:  12/19/2024  [id number]  NILM (Normal)   CASE COMMENT:   DIAGNOSIS ICD CODE(S): Clinician Provided ICD10:      D49.4: NEOPLASM OF UNSPECIFIED BEHAVIOR OF BLADDER - Examination of Feulgen stained slides contributes to the diagnosis.   CYTO TECHNOLOGIST: Rosaline Cuna, CT (ASCP) (Signature on file)  PATHOLOGIST: Josphine Charls Cea, M.D. (Signature on file)  PERFORMING LABS:      [facility] [address] [place], [place], [address] Medical  Director:  Maude Dolores M.D.  [employer] is a subsidiary of [employer], using the brand [facility].  Feulgen stain performed by [employer] [address], [place], [place], [address],   Maude Dolores M.D., Medical Director, [phone]   Vitamin D  (25 hydroxy)     Status: None   Collection Time: 12/29/24 10:20 AM  Result Value Ref Range   Vit D, 25-Hydroxy 41.6 30.0 - 100.0 ng/mL    Comment: Vitamin D  deficiency has been defined by the Institute of Medicine and an Endocrine Society practice guideline as a level of serum 25-OH vitamin D  less than 20 ng/mL (1,2). The Endocrine Society went on to further define vitamin D  insufficiency as a level between 21 and 29 ng/mL (2). 1. IOM (Institute of Medicine). 2010. Dietary reference    intakes for calcium  and D. [place]: The    [employer]. 2. Holick MF, Binkley [place], Bischoff-Ferrari HA, et al.    Evaluation, treatment, and prevention of vitamin D     deficiency: an Endocrine Society clinical practice    guideline. JCEM. 2011 Jul; 96(7):1911-30.   Basic metabolic panel with GFR     Status: Abnormal   Collection Time: 12/29/24 10:21 AM  Result Value Ref Range   Glucose 128 (H) 70 - 99 mg/dL   BUN 18 8 - 27 mg/dL   Creatinine, Ser 8.80 0.76 - 1.27 mg/dL   eGFR 63 >40 fO/fpw/8.26   BUN/Creatinine Ratio 15 10 - 24   Sodium 144 134 - 144 mmol/[name]   Potassium 3.7 3.5 - 5.2 mmol/[name]   Chloride 104 96 - 106 mmol/[name]   CO2 24 20 - 29 mmol/[name]   Calcium  11.0 (H) 8.6 - 10.2 mg/dL  POCT  CBG (Fasting - Glucose)     Status: Abnormal   Collection Time: 01/02/25 10:38 AM  Result Value Ref Range   Glucose Fasting, POC 136 (A) 70 - 99 mg/dL      Assessment & Plan:  Check blood work today. Endocrinology referral sent. Start Augmentin  twice daily. Continue medications as prescribed. Reinforced eating healthy diet and staying active. Avoid excessive dairy product intake. Problem  List Items Addressed This Visit       Cardiovascular and Mediastinum   Hypertension associated with diabetes (HCC) - Primary     Respiratory   Acute maxillary sinusitis   Relevant Medications   amoxicillin -clavulanate (AUGMENTIN ) 875-125 MG tablet     Endocrine   Insulin  dependent type 2 diabetes mellitus (HCC)   Relevant Orders   POCT CBG (Fasting - Glucose) (Completed)   Combined hyperlipidemia associated with type 2 diabetes mellitus (HCC)     Other   Hypercalcemia   Relevant Orders   Ambulatory referral to Endocrinology   Serum protein electrophoresis with reflex   Class 1 obesity due to excess calories with serious comorbidity and body mass index (BMI) of 34.0 to 34.9 in adult    Return in about 10 days (around 01/12/2025).   Total time spent: 25 minutes. This time includes review of previous notes and results and patient face to face interaction during today's visit.    Martin FREDY RAMAN, MD  01/02/2025   This document may have been prepared by [facility] [employer] software and as such may include unintentional dictation errors.     [1]  Allergies Allergen Reactions   Metformin Diarrhea  [2]  Outpatient Medications Prior to Visit  Medication Sig   acetaminophen  (TYLENOL ) 500 MG tablet Take 500 mg by mouth every 6 (six) hours as needed for moderate pain (pain score 4-6).   amitriptyline  (ELAVIL ) 25 MG tablet TAKE 2 TABLETS BY MOUTH AT BEDTIME   aspirin  EC 81 MG tablet Take 81 mg by mouth daily. Swallow whole.   azelastine  (ASTELIN ) 0.1 % nasal spray Place 1 spray into both nostrils 2 (two) times daily. Use in each nostril as directed   baclofen  (LIORESAL ) 10 MG tablet Take 1 tablet (10 mg total) by mouth at bedtime.   benzonatate (TESSALON) 100 MG capsule Take 1 capsule by mouth three times daily as needed for cough   carvedilol  (COREG ) 6.25 MG tablet Take 1 tablet (6.25 mg total) by mouth 2 (two) times daily.   cetirizine  (ZYRTEC  ALLERGY) 10 MG tablet Take 1  tablet (10 mg total) by mouth daily.   Cholecalciferol  50 MCG (2000 UT) CAPS Take 2,000 Units by mouth daily.   cyanocobalamin  (VITAMIN B12) 500 MCG tablet Take 500 mcg by mouth daily.   empagliflozin  (JARDIANCE ) 25 MG TABS tablet Take 25 mg by mouth daily.   fluorouracil (EFUDEX) 5 % cream Apply topically. Apply topically two (2) times a day. Use for 4 weeks   fluticasone  (FLONASE ) 50 MCG/ACT nasal spray Use 2 spray(s) in each nostril once daily   glimepiride  (AMARYL ) 2 MG tablet Take 1 tablet by mouth twice daily   insulin  NPH-regular Human (70-30) 100 UNIT/ML injection Inject 37 Units into the skin 2 (two) times daily with a meal.   Menthol, Topical Analgesic, (BIOFREEZE COOL THE PAIN) 5 % PTCH Place 1 patch onto the skin daily as needed (pain).   ONETOUCH ULTRA BLUE TEST test strip USE TO CHECK BLOOD SUAGR THREE TIMES DAILY   primidone  (MYSOLINE ) 50 MG tablet Take 1 tablet (  50 mg total) by mouth in the morning.   rosuvastatin  (CRESTOR ) 40 MG tablet TAKE 1 TABLET BY MOUTH ONCE DAILY REPLACES  ATORVASTATIN   tamsulosin  (FLOMAX ) 0.4 MG CAPS capsule TAKE 1 CAPSULE BY MOUTH ONCE DAILY AFTER BREAKFAST   Vibegron  (GEMTESA ) 75 MG TABS Take 1 tablet (75 mg total) by mouth daily.   hydrochlorothiazide  (MICROZIDE ) 12.5 MG capsule Take 1 tablet Monday, Wednesday, Fridays only. (Patient not taking: Reported on 01/02/2025)   Facility-Administered Medications Prior to Visit  Medication Dose Route Frequency Provider   technetium sestamibi generic (CARDIOLITE ) injection 10 millicurie  10 millicurie Intravenous Once PRN Martin Denyse LABOR, MD   "

## 2025-01-03 LAB — PROTEIN ELECTROPHORESIS, SERUM, WITH REFLEX
A/G Ratio: 1.1 (ref 0.7–1.7)
Albumin ELP: 3.3 g/dL (ref 2.9–4.4)
Alpha 1: 0.2 g/dL (ref 0.0–0.4)
Alpha 2: 0.9 g/dL (ref 0.4–1.0)
Beta: 1 g/dL (ref 0.7–1.3)
Gamma Globulin: 1.1 g/dL (ref 0.4–1.8)
Globulin, Total: 3.1 g/dL (ref 2.2–3.9)
Total Protein: 6.4 g/dL (ref 6.0–8.5)

## 2025-01-03 LAB — COLOGUARD

## 2025-01-12 ENCOUNTER — Ambulatory Visit: Payer: Self-pay | Admitting: Internal Medicine

## 2025-01-12 ENCOUNTER — Ambulatory Visit: Admitting: Internal Medicine

## 2025-01-12 ENCOUNTER — Encounter: Payer: Self-pay | Admitting: Internal Medicine

## 2025-01-12 VITALS — BP 148/80 | HR 69 | Ht 66.0 in | Wt 211.8 lb

## 2025-01-12 DIAGNOSIS — Z794 Long term (current) use of insulin: Secondary | ICD-10-CM

## 2025-01-12 DIAGNOSIS — I152 Hypertension secondary to endocrine disorders: Secondary | ICD-10-CM

## 2025-01-12 DIAGNOSIS — E1159 Type 2 diabetes mellitus with other circulatory complications: Secondary | ICD-10-CM

## 2025-01-12 DIAGNOSIS — E1169 Type 2 diabetes mellitus with other specified complication: Secondary | ICD-10-CM | POA: Diagnosis not present

## 2025-01-12 DIAGNOSIS — J302 Other seasonal allergic rhinitis: Secondary | ICD-10-CM | POA: Diagnosis not present

## 2025-01-12 DIAGNOSIS — E1165 Type 2 diabetes mellitus with hyperglycemia: Secondary | ICD-10-CM

## 2025-01-12 DIAGNOSIS — E119 Type 2 diabetes mellitus without complications: Secondary | ICD-10-CM

## 2025-01-12 DIAGNOSIS — E782 Mixed hyperlipidemia: Secondary | ICD-10-CM | POA: Diagnosis not present

## 2025-01-12 LAB — POCT CBG (FASTING - GLUCOSE)-MANUAL ENTRY: Glucose Fasting, POC: 188 mg/dL — AB (ref 70–99)

## 2025-01-12 NOTE — Progress Notes (Signed)
 "  Established Patient Office Visit  Subjective:  Patient ID: [name], male    DOB: 02-May-1946  Age: 79 y.o. MRN: [id number]  Chief Complaint  Patient presents with   Follow-up    10 day follow up    Patient comes in for his follow-up today.  He has completed his round of Augmentin  and is feeling much better.  He gets very little cough.  No fevers or chills and no postnasal drip.  Patient's labs have been discussed and his calcium  stays high.  His SPEP is unremarkable.  He has been referred to endocrine for hypocalcemia, but has not made an appointment yet.  No other complaints today.    No other concerns at this time.   Past Medical History:  Diagnosis Date   Abdominal wall hernia    a.) large; contained portions of the liver, RIGHT colon, and mid-distal small bowel   Aortic atherosclerosis    Arthritis    Basal cell carcinoma of skin    Bifascicular block (RBBB + LPFB)    Bilateral carotid artery stenosis    a.) s/p PTA 08/26/2021 - 10 x 8 x 40 Exact stent RICA   Bladder tumor    BPH (benign prostatic hyperplasia)    Cerebral microvascular disease    Cervical spondylosis    Chronic midline thoracic back pain    Coronary artery disease    Diabetic polyneuropathy (HCC)    Diverticulosis    HFrEF (heart failure with reduced ejection fraction) (HCC)    History of 2019 novel coronavirus disease (COVID-19) 09/10/2020   a.) Tx'd with casirivimab/irdevimab infusion on 09/11/2020   History of kidney stones    HOH (hard of hearing)    Hypertension    Long-term use of aspirin  therapy    Lumbago of lumbar region with sciatica    Obesity    Pulmonary emphysema (HCC)    Renal cell cancer (HCC)    a.) s/p RIGHT nephrectomy   Spondylosis of thoracolumbar spine    T2DM (type 2 diabetes mellitus) (HCC)    Throat cancer (HCC)    Tremor     Past Surgical History:  Procedure Laterality Date   APPENDECTOMY     BACK SURGERY     BLADDER INSTILLATION N/A 07/21/2024    Procedure: INSTILLATION, BLADDER;  Surgeon: Francisca Redell BROCKS, MD;  Location: [name];  Service: Urology;  Laterality: N/A;  GEMCITABINE    BLADDER SURGERY     CAROTID PTA/STENT INTERVENTION Right 08/26/2021   Procedure: CAROTID PTA/STENT INTERVENTION;  Surgeon: Jama Cordella KANDICE, MD;  Location: [name];  Service: Cardiovascular;  Laterality: Right;   CATARACT EXTRACTION W/PHACO Right 01/21/2023   Procedure: CATARACT EXTRACTION PHACO AND INTRAOCULAR LENS PLACEMENT (IOC) RIGHT DIABETIC  4.89  00:45.6;  Surgeon: Enola Feliciano Hugger, MD;  Location: [facility];  Service: Ophthalmology;  Laterality: Right;  Diabetic   CERVICAL SPINE SURGERY     CHOLECYSTECTOMY N/A 03/23/2018   Procedure: LAPAROSCOPIC CHOLECYSTECTOMY;  Surgeon: Rodolph Romano, MD;  Location: [name];  Service: General;  Laterality: N/A;   COLONOSCOPY     COLONOSCOPY WITH PROPOFOL  N/A 11/10/2017   Procedure: COLONOSCOPY WITH PROPOFOL ;  Surgeon: Toledo, Ladell POUR, MD;  Location: [name];  Service: Gastroenterology;  Laterality: N/A;   CYSTOSCOPY W/ RETROGRADES Bilateral 07/21/2024   Procedure: CYSTOSCOPY, WITH RETROGRADE PYELOGRAM;  Surgeon: Francisca Redell BROCKS, MD;  Location: [name];  Service: Urology;  Laterality: Bilateral;   CYSTOSCOPY WITH BIOPSY N/A 07/21/2024  Procedure: CYSTOSCOPY, WITH BIOPSY;  Surgeon: Francisca Redell BROCKS, MD;  Location: [name];  Service: Urology;  Laterality: N/A;   ESOPHAGOGASTRODUODENOSCOPY (EGD) WITH PROPOFOL  N/A 11/10/2017   Procedure: ESOPHAGOGASTRODUODENOSCOPY (EGD) WITH PROPOFOL ;  Surgeon: Toledo, Ladell POUR, MD;  Location: [name];  Service: Gastroenterology;  Laterality: N/A;   HERNIA REPAIR     left od surgery     MICROLARYNGOSCOPY N/A 11/05/2020   Procedure: MICROLARYNGOSCOPY WITH BIOPSIES;  Surgeon: Blair Mt, MD;  Location: [facility];  Service: ENT;  Laterality: N/A;   NEPHRECTOMY     right kidney nephrectomy   rt elbow surgery      THROAT SURGERY  1990   for cancer removal at [facility]   TRANSURETHRAL RESECTION OF BLADDER TUMOR  07/21/2024   Procedure: TURBT (TRANSURETHRAL RESECTION OF BLADDER TUMOR);  Surgeon: Francisca Redell BROCKS, MD;  Location: [name];  Service: Urology;;   TRANSURETHRAL RESECTION OF BLADDER TUMOR N/A 09/04/2024   Procedure: TURBT (TRANSURETHRAL RESECTION OF BLADDER TUMOR);  Surgeon: Francisca Redell BROCKS, MD;  Location: [name];  Service: Urology;  Laterality: N/A;    Social History   Socioeconomic History   Marital status: Married    Spouse name: Broman,[name] (Spouse)   Number of children: Not on file   Years of education: Not on file   Highest education level: Not on file  Occupational History   Not on file  Tobacco Use   Smoking status: Former    Current packs/day: 0.00    Types: Cigarettes    Quit date: 83    Years since quitting: 35.0   Smokeless tobacco: Never  Vaping Use   Vaping status: Never Used  Substance and Sexual Activity   Alcohol use: Not Currently    Comment: Occasional beer drinker   Drug use: No   Sexual activity: Yes  Other Topics Concern   Not on file  Social History Narrative   Lives at home with wife, active and independent at baseline   Social Drivers of Health   Tobacco Use: Medium Risk (01/12/2025)   Patient History    Smoking Tobacco Use: Former    Smokeless Tobacco Use: Never    Passive Exposure: Not on Actuary Strain: Low Risk  (07/05/2024)   Received from [facility]   Overall Financial Resource Strain (CARDIA)    Difficulty of Paying Living Expenses: Not hard at all  Food Insecurity: No Food Insecurity (07/05/2024)   Received from [facility]   Epic    Within the past 12 months, you worried that your food would run out before you got the money to buy more.: Never true    Within the past 12 months, the food you bought just didn't last and you didn't have money to get more.: Never true   Transportation Needs: No Transportation Needs (07/05/2024)   Received from [facility] - Transportation    In the past 12 months, has lack of transportation kept you from medical appointments or from getting medications?: No    Lack of Transportation (Non-Medical): No  Physical Activity: Not on file  Stress: Not on file  Social Connections: Socially Integrated (04/26/2024)   Social Connection and Isolation Panel    Frequency of Communication with Friends and Family: More than three times a week    Frequency of Social Gatherings with Friends and Family: Twice a week    Attends Religious Services: More than 4 times per  year    Active Member of Clubs or Organizations: Yes    Attends Banker Meetings: More than 4 times per year    Marital Status: Married  Catering Manager Violence: Not At Risk (04/26/2024)   Humiliation, Afraid, Rape, and Kick questionnaire    Fear of Current or Ex-Partner: No    Emotionally Abused: No    Physically Abused: No    Sexually Abused: No  Depression (PHQ2-9): Low Risk (12/14/2024)   Depression (PHQ2-9)    PHQ-2 Score: 1  Alcohol Screen: Not on file  Housing: Unknown (01/10/2025)   Received from [facility]   Epic    In the last 12 months, was there a time when you were not able to pay the mortgage or rent on time?: No    Number of Times Moved in the Last Year: Not on file    At any time in the past 12 months, were you homeless or living in a shelter (including now)?: No  Utilities: Not At Risk (07/05/2024)   Received from [facility]   Epic    In the past 12 months has the electric, gas, oil, or water  company threatened to shut off services in your home?: No  Health Literacy: Not on file    Family History  Problem Relation Age of Onset   Diabetes Brother    Hypertension Brother    CAD Brother    CAD Brother     Allergies[1]  Show/hide medication list[2]  Review of Systems   Constitutional: Negative.  Negative for chills, fever and malaise/fatigue.  HENT: Negative.  Negative for congestion and sore throat.   Eyes: Negative.  Negative for blurred vision and pain.  Respiratory: Negative.  Negative for cough and shortness of breath.   Cardiovascular: Negative.  Negative for chest pain, palpitations and leg swelling.  Gastrointestinal: Negative.  Negative for abdominal pain, blood in stool, constipation, diarrhea, heartburn, melena, nausea and vomiting.  Genitourinary: Negative.  Negative for dysuria, flank pain, frequency and urgency.  Musculoskeletal: Negative.  Negative for joint pain and myalgias.  Skin: Negative.   Neurological: Negative.  Negative for dizziness, tingling, sensory change, weakness and headaches.  Endo/Heme/Allergies: Negative.   Psychiatric/Behavioral: Negative.  Negative for depression and suicidal ideas. The patient is not nervous/anxious.        Objective:   BP (!) 148/80   Pulse 69   Ht 5' 6 (1.676 m)   Wt 211 lb 12.8 oz (96.1 kg)   SpO2 99%   BMI 34.19 kg/m   Vitals:   01/12/25 1334  BP: (!) 148/80  Pulse: 69  Height: 5' 6 (1.676 m)  Weight: 211 lb 12.8 oz (96.1 kg)  SpO2: 99%  BMI (Calculated): 34.2    Physical Exam Vitals and nursing note reviewed.  Constitutional:      General: He is not in acute distress.    Appearance: Normal appearance. He is not ill-appearing.  HENT:     Head: Normocephalic and atraumatic.     Nose: Nose normal.     Mouth/Throat:     Mouth: Mucous membranes are moist.     Pharynx: Oropharynx is clear.  Eyes:     Conjunctiva/sclera: Conjunctivae normal.     Pupils: Pupils are equal, round, and reactive to light.  Cardiovascular:     Rate and Rhythm: Normal rate and regular rhythm.     Pulses: Normal pulses.     Heart sounds: Normal heart sounds.  Pulmonary:  Effort: Pulmonary effort is normal.     Breath sounds: Normal breath sounds. No wheezing or rhonchi.  Abdominal:      General: Bowel sounds are normal. There is no distension.     Palpations: Abdomen is soft.     Tenderness: There is no abdominal tenderness.  Musculoskeletal:        General: Normal range of motion.     Cervical back: Normal range of motion and neck supple.     Right lower leg: No edema.     Left lower leg: No edema.  Skin:    General: Skin is warm and dry.     Capillary Refill: Capillary refill takes less than 2 seconds.  Neurological:     General: No focal deficit present.     Mental Status: He is alert and oriented to person, place, and time.     Sensory: No sensory deficit.     Motor: No weakness.  Psychiatric:        Mood and Affect: Mood normal.        Behavior: Behavior normal.        Judgment: Judgment normal.      Results for orders placed or performed in visit on 01/12/25  POCT CBG (Fasting - Glucose)  Result Value Ref Range   Glucose Fasting, POC 188 (A) 70 - 99 mg/dL    Recent Results (from the past 2160 hours)  Urinalysis, Complete     Status: Abnormal   Collection Time: 10/18/24 11:26 AM  Result Value Ref Range   Specific Gravity, UA 1.010 1.005 - 1.030   pH, UA 6.0 5.0 - 7.5   Color, UA Yellow Yellow   Appearance Ur Clear Clear   Leukocytes,UA Negative Negative   Protein,UA Negative Negative/Trace   Glucose, UA 3+ (A) Negative   Ketones, UA Negative Negative   RBC, UA Trace (A) Negative   Bilirubin, UA Negative Negative   Urobilinogen, Ur 0.2 0.2 - 1.0 mg/dL   Nitrite, UA Negative Negative   Microscopic Examination See below:     Comment: Microscopic was indicated and was performed.  Microscopic Examination     Status: Abnormal   Collection Time: 10/18/24 11:26 AM   Urine  Result Value Ref Range   WBC, UA 6-10 (A) 0 - 5 /hpf   RBC, Urine 3-10 (A) 0 - 2 /hpf   Epithelial Cells (non renal) >10 (A) 0 - 10 /hpf   Bacteria, UA Few None seen/Few  Urinalysis, Complete     Status: Abnormal   Collection Time: 10/25/24 10:51 AM  Result Value Ref Range    Specific Gravity, UA 1.015 1.005 - 1.030   pH, UA 6.0 5.0 - 7.5   Color, UA Yellow Yellow   Appearance Ur Clear Clear   Leukocytes,UA Negative Negative   Protein,UA Negative Negative/Trace   Glucose, UA 3+ (A) Negative   Ketones, UA Negative Negative   RBC, UA 1+ (A) Negative   Bilirubin, UA Negative Negative   Urobilinogen, Ur 0.2 0.2 - 1.0 mg/dL   Nitrite, UA Negative Negative   Microscopic Examination See below:     Comment: Microscopic was indicated and was performed.  Microscopic Examination     Status: Abnormal   Collection Time: 10/25/24 10:51 AM   Urine  Result Value Ref Range   WBC, UA 6-10 (A) 0 - 5 /hpf   RBC, Urine 11-30 (A) 0 - 2 /hpf   Epithelial Cells (non renal) 0-10 0 - 10 /hpf  Bacteria, UA Few None seen/Few  Urinalysis, Complete     Status: Abnormal   Collection Time: 11/01/24 10:42 AM  Result Value Ref Range   Specific Gravity, UA 1.010 1.005 - 1.030   pH, UA 7.0 5.0 - 7.5   Color, UA Orange Yellow   Appearance Ur Clear Clear   Leukocytes,UA Negative Negative   Protein,UA Trace Negative/Trace   Glucose, UA 3+ (A) Negative   Ketones, UA Negative Negative   RBC, UA Trace (A) Negative   Bilirubin, UA Negative Negative   Urobilinogen, Ur 2.0 (H) 0.2 - 1.0 mg/dL   Nitrite, UA Positive (A) Negative   Microscopic Examination See below:     Comment: Microscopic was indicated and was performed.  Microscopic Examination     Status: Abnormal   Collection Time: 11/01/24 10:42 AM   Urine  Result Value Ref Range   WBC, UA 0-5 0 - 5 /hpf   RBC, Urine 3-10 (A) 0 - 2 /hpf   Epithelial Cells (non renal) >10 (A) 0 - 10 /hpf   Mucus, UA Present (A) Not Estab.   Bacteria, UA Few None seen/Few  Urinalysis, Complete     Status: Abnormal   Collection Time: 11/08/24 11:13 AM  Result Value Ref Range   Specific Gravity, UA 1.015 1.005 - 1.030   pH, UA 6.5 5.0 - 7.5   Color, UA Yellow Yellow   Appearance Ur Slightly cloudy Clear   Leukocytes,UA Negative Negative    Protein,UA Negative Negative/Trace   Glucose, UA 3+ (A) Negative   Ketones, UA Negative Negative   RBC, UA 2+ (A) Negative   Bilirubin, UA Negative Negative   Urobilinogen, Ur 0.2 0.2 - 1.0 mg/dL   Nitrite, UA Negative Negative   Microscopic Examination See below:     Comment: Microscopic was indicated and was performed.  Microscopic Examination     Status: Abnormal   Collection Time: 11/08/24 11:13 AM   Urine  Result Value Ref Range   WBC, UA 6-10 (A) 0 - 5 /hpf   RBC, Urine >30 (A) 0 - 2 /hpf   Epithelial Cells (non renal) >10 (A) 0 - 10 /hpf   Bacteria, UA Few None seen/Few  CULTURE, URINE COMPREHENSIVE     Status: Abnormal   Collection Time: 11/08/24  1:04 PM   Specimen: Urine   UR  Result Value Ref Range   Urine Culture, Comprehensive Final report (A)    Organism ID, Bacteria Enterococcus faecalis (A)     Comment: Enterococci susceptible to penicillin are predictably susceptible to ampicillin, amoxicillin , ampicillin-sulbactam, amoxicillin -clavulanate, and piperacillin -tazobactam for non-beta-lactamase producing enterococci. (CLSI 2018) For Enterococcus species, aminoglycosides (except for high-level resistance screening), cephalosporins, clindamycin, and trimethoprim-sulfamethoxazole are not effective clinically. (CLSI, M100-S26, 2016) 3,000 Colonies/mL    Organism ID, Bacteria Comment     Comment: Mixed urogenital flora 1,000 Colonies/mL    ANTIMICROBIAL SUSCEPTIBILITY Comment     Comment:       ** S = Susceptible; I = Intermediate; R = Resistant **                    P = Positive; N = Negative             MICS are expressed in micrograms per mL    Antibiotic                 RSLT#1    RSLT#2    RSLT#3    RSLT#4 Ciprofloxacin   S Levofloxacin                   S Nitrofurantoin                  S Penicillin                     S Tetracycline                   R Vancomycin                     S   POCT CBG (Fasting - Glucose)     Status: Abnormal    Collection Time: 11/14/24  9:56 AM  Result Value Ref Range   Glucose Fasting, POC 166 (A) 70 - 99 mg/dL  Urinalysis, Complete     Status: Abnormal   Collection Time: 11/15/24 10:30 AM  Result Value Ref Range   Specific Gravity, UA 1.015 1.005 - 1.030   pH, UA 6.0 5.0 - 7.5   Color, UA Yellow Yellow   Appearance Ur Slightly cloudy Clear   Leukocytes,UA Negative Negative   Protein,UA Trace Negative/Trace   Glucose, UA 3+ (A) Negative   Ketones, UA Negative Negative   RBC, UA 3+ (A) Negative   Bilirubin, UA Negative Negative   Urobilinogen, Ur 1.0 0.2 - 1.0 mg/dL   Nitrite, UA Negative Negative   Microscopic Examination See below:     Comment: Microscopic was indicated and was performed.  Microscopic Examination     Status: Abnormal   Collection Time: 11/15/24 10:30 AM   Urine  Result Value Ref Range   WBC, UA 0-5 0 - 5 /hpf   RBC, Urine >30 (A) 0 - 2 /hpf   Epithelial Cells (non renal) 0-10 0 - 10 /hpf   Bacteria, UA Few None seen/Few  CMP14+EGFR     Status: Abnormal   Collection Time: 12/12/24  8:42 AM  Result Value Ref Range   Glucose 157 (H) 70 - 99 mg/dL   BUN 17 8 - 27 mg/dL   Creatinine, Ser 8.81 0.76 - 1.27 mg/dL   eGFR 64 >40 fO/fpw/8.26   BUN/Creatinine Ratio 14 10 - 24   Sodium 141 134 - 144 mmol/L   Potassium 4.5 3.5 - 5.2 mmol/L    Comment: Specimen received hemolyzed. Value may be increased by hemolysis. Clinical correlation indicated.    Chloride 100 96 - 106 mmol/L   CO2 22 20 - 29 mmol/L   Calcium  11.5 (H) 8.6 - 10.2 mg/dL   Total Protein 7.1 6.0 - 8.5 g/dL   Albumin 4.3 3.8 - 4.8 g/dL   Globulin, Total 2.8 1.5 - 4.5 g/dL   Bilirubin Total 0.6 0.0 - 1.2 mg/dL   Alkaline Phosphatase 64 47 - 123 IU/L   AST 30 0 - 40 IU/L   ALT 18 0 - 44 IU/L  Lipid panel     Status: None   Collection Time: 12/12/24  8:42 AM  Result Value Ref Range   Cholesterol, Total 135 100 - 199 mg/dL   Triglycerides 859 0 - 149 mg/dL   HDL 45 >60 mg/dL   VLDL Cholesterol Cal 24  5 - 40 mg/dL   LDL Chol Calc (NIH) 66 0 - 99 mg/dL   Chol/HDL Ratio 3.0 0.0 - 5.0 ratio    Comment:  T. Chol/HDL Ratio                                             Men  Women                               1/2 Avg.Risk  3.4    3.3                                   Avg.Risk  5.0    4.4                                2X Avg.Risk  9.6    7.1                                3X Avg.Risk 23.4   11.0   Hemoglobin A1c     Status: Abnormal   Collection Time: 12/12/24  8:42 AM  Result Value Ref Range   Hgb A1c MFr Bld 6.6 (H) 4.8 - 5.6 %    Comment:          Prediabetes: 5.7 - 6.4          Diabetes: >6.4          Glycemic control for adults with diabetes: <7.0    Est. average glucose Bld gHb Est-mCnc 143 mg/dL  TSH     Status: None   Collection Time: 12/12/24  8:42 AM  Result Value Ref Range   TSH 1.980 0.450 - 4.500 uIU/mL  Cytology Plus Monitoring Profile: Pap & Feulgen     Status: None   Collection Time: 12/19/24 12:00 AM  Result Value Ref Range   CYTOLOGY PLUS MONITORING PROFILE: PAP & FEULGEN Comment     Comment: REPORT STATUS: FINAL RESULTS:  SPECIMEN SOURCE: Bladder Wash  DIAGNOSIS: Negative for malignant cells or dysplasia.   MACROSCOPIC DESCRIPTION: Received is 70ml of light yellow, clear, fixed urine.  CPT CODES: 11887   CASE CLINICAL INFORMATION:  No clinical data specified No therapy  CASE CLINICAL HISTORY:  12/19/2024  [id number]  NILM (Normal)   CASE COMMENT:   DIAGNOSIS ICD CODE(S): Clinician Provided ICD10:      D49.4: NEOPLASM OF UNSPECIFIED BEHAVIOR OF BLADDER - Examination of Feulgen stained slides contributes to the diagnosis.   CYTO TECHNOLOGIST: Rosaline Cuna, CT (ASCP) (Signature on file)  PATHOLOGIST: Josphine Charls Cea, M.D. (Signature on file)  PERFORMING LABS:      [facility] [address] [place], [place], [address] Medical Director:  Maude Dolores M.D.  [employer] is a  subsidiary of [employer], using the brand [facility].  Feulgen stain performed by [employer] [address], [place], [place], [address],   Maude Dolores M.D., Medical Director, [phone]   Vitamin D  (25 hydroxy)     Status: None   Collection Time: 12/29/24 10:20 AM  Result Value Ref Range   Vit D, 25-Hydroxy 41.6 30.0 - 100.0 ng/mL    Comment: Vitamin D  deficiency has been defined by the Institute of Medicine and an Endocrine Society practice guideline as a level  of serum 25-OH vitamin D  less than 20 ng/mL (1,2). The Endocrine Society went on to further define vitamin D  insufficiency as a level between 21 and 29 ng/mL (2). 1. IOM (Institute of Medicine). 2010. Dietary reference    intakes for calcium  and D. [place]: The    [employer]. 2. Holick MF, Binkley [place], Bischoff-Ferrari HA, et al.    Evaluation, treatment, and prevention of vitamin D     deficiency: an Endocrine Society clinical practice    guideline. JCEM. 2011 Jul; 96(7):1911-30.   Basic metabolic panel with GFR     Status: Abnormal   Collection Time: 12/29/24 10:21 AM  Result Value Ref Range   Glucose 128 (H) 70 - 99 mg/dL   BUN 18 8 - 27 mg/dL   Creatinine, Ser 8.80 0.76 - 1.27 mg/dL   eGFR 63 >40 fO/fpw/8.26   BUN/Creatinine Ratio 15 10 - 24   Sodium 144 134 - 144 mmol/L   Potassium 3.7 3.5 - 5.2 mmol/L   Chloride 104 96 - 106 mmol/L   CO2 24 20 - 29 mmol/L   Calcium  11.0 (H) 8.6 - 10.2 mg/dL  Cologuard     Status: None   Collection Time: 01/01/25  9:30 AM  Result Value Ref Range   COLOGUARD Sample Could Not Be Processed 3 N/A    Comment: The Cologuard (TM) test was assigned to this specimen.  The stool exceeds the allowable weight (300 grams). The patient will be contacted to initiate a new sample collection.  POCT CBG (Fasting - Glucose)     Status: Abnormal   Collection Time: 01/02/25 10:38 AM  Result Value Ref Range   Glucose Fasting,  POC 136 (A) 70 - 99 mg/dL  Serum protein electrophoresis with reflex     Status: None   Collection Time: 01/02/25 11:16 AM  Result Value Ref Range   Total Protein 6.4 6.0 - 8.5 g/dL   Albumin ELP 3.3 2.9 - 4.4 g/dL   Alpha 1 0.2 0.0 - 0.4 g/dL   Alpha 2 0.9 0.4 - 1.0 g/dL   Beta 1.0 0.7 - 1.3 g/dL   Gamma Globulin 1.1 0.4 - 1.8 g/dL   M-Spike, % Not Observed Not Observed g/dL   Globulin, Total 3.1 2.2 - 3.9 g/dL   A/G Ratio 1.1 0.7 - 1.7   Please Note: Comment     Comment: Protein electrophoresis scan will follow via computer, mail, or courier delivery.    Interpretation(See Below) Comment     Comment: The SPE pattern appears unremarkable. Evidence of monoclonal protein is not apparent.   POCT CBG (Fasting - Glucose)     Status: Abnormal   Collection Time: 01/12/25  1:39 PM  Result Value Ref Range   Glucose Fasting, POC 188 (A) 70 - 99 mg/dL      Assessment & Plan:  Continue current meds.  Monitor calcium .  To proceed with endocrine consult. Problem List Items Addressed This Visit       Cardiovascular and Mediastinum   Hypertension associated with diabetes (HCC)     Respiratory   Seasonal allergic rhinitis     Endocrine   Insulin  dependent type 2 diabetes mellitus (HCC) - Primary   Relevant Orders   POCT CBG (Fasting - Glucose) (Completed)   Combined hyperlipidemia associated with type 2 diabetes mellitus (HCC)     Other   Hypercalcemia    Return in about 3 months (around 04/12/2025).   Total time spent: 25 minutes. This time  includes review of previous notes and results and patient face to face interaction during today's visit.    FERNAND FREDY RAMAN, MD  01/12/2025   This document may have been prepared by [facility] Voice Recognition software and as such may include unintentional dictation errors.     [1]  Allergies Allergen Reactions   Metformin Diarrhea  [2]  Outpatient Medications Prior to Visit  Medication Sig   acetaminophen  (TYLENOL ) 500 MG tablet  Take 500 mg by mouth every 6 (six) hours as needed for moderate pain (pain score 4-6).   amitriptyline  (ELAVIL ) 25 MG tablet TAKE 2 TABLETS BY MOUTH AT BEDTIME   aspirin  EC 81 MG tablet Take 81 mg by mouth daily. Swallow whole.   azelastine  (ASTELIN ) 0.1 % nasal spray Place 1 spray into both nostrils 2 (two) times daily. Use in each nostril as directed   benzonatate (TESSALON) 100 MG capsule Take 1 capsule by mouth three times daily as needed for cough   carvedilol  (COREG ) 6.25 MG tablet Take 1 tablet (6.25 mg total) by mouth 2 (two) times daily.   cetirizine  (ZYRTEC  ALLERGY) 10 MG tablet Take 1 tablet (10 mg total) by mouth daily.   Cholecalciferol  50 MCG (2000 UT) CAPS Take 2,000 Units by mouth daily.   cyanocobalamin  (VITAMIN B12) 500 MCG tablet Take 500 mcg by mouth daily.   empagliflozin  (JARDIANCE ) 25 MG TABS tablet Take 25 mg by mouth daily.   fluticasone  (FLONASE ) 50 MCG/ACT nasal spray Use 2 spray(s) in each nostril once daily   glimepiride  (AMARYL ) 2 MG tablet Take 1 tablet by mouth twice daily   insulin  NPH-regular Human (70-30) 100 UNIT/ML injection Inject 37 Units into the skin 2 (two) times daily with a meal.   Menthol, Topical Analgesic, (BIOFREEZE COOL THE PAIN) 5 % PTCH Place 1 patch onto the skin daily as needed (pain).   ONETOUCH ULTRA BLUE TEST test strip USE TO CHECK BLOOD SUAGR THREE TIMES DAILY   primidone  (MYSOLINE ) 50 MG tablet Take 1 tablet (50 mg total) by mouth in the morning.   rosuvastatin  (CRESTOR ) 40 MG tablet TAKE 1 TABLET BY MOUTH ONCE DAILY REPLACES  ATORVASTATIN   tamsulosin  (FLOMAX ) 0.4 MG CAPS capsule TAKE 1 CAPSULE BY MOUTH ONCE DAILY AFTER BREAKFAST   amoxicillin -clavulanate (AUGMENTIN ) 875-125 MG tablet Take 1 tablet by mouth 2 (two) times daily. (Patient not taking: Reported on 01/12/2025)   baclofen  (LIORESAL ) 10 MG tablet Take 1 tablet (10 mg total) by mouth at bedtime. (Patient not taking: Reported on 01/12/2025)   fluorouracil (EFUDEX) 5 % cream Apply  topically. Apply topically two (2) times a day. Use for 4 weeks (Patient not taking: Reported on 01/12/2025)   hydrochlorothiazide  (MICROZIDE ) 12.5 MG capsule Take 1 tablet Monday, Wednesday, Fridays only. (Patient not taking: Reported on 01/12/2025)   Vibegron  (GEMTESA ) 75 MG TABS Take 1 tablet (75 mg total) by mouth daily. (Patient not taking: Reported on 01/12/2025)   Facility-Administered Medications Prior to Visit  Medication Dose Route Frequency Provider   technetium sestamibi generic (CARDIOLITE ) injection 10 millicurie  10 millicurie Intravenous Once PRN Fernand Denyse LABOR, MD   "

## 2025-01-15 ENCOUNTER — Telehealth: Payer: Self-pay

## 2025-01-15 NOTE — Telephone Encounter (Signed)
 Pts family member called and wanted to know results from Urine cytology. I read to her Dr. Jacqueline message he had sent and the plan to do the 3 BCG treatments and a cysto to follow. I let her know that someone will be in touch with them soon about setting up those treatments.

## 2025-01-27 ENCOUNTER — Other Ambulatory Visit: Payer: Self-pay | Admitting: Internal Medicine

## 2025-01-27 ENCOUNTER — Other Ambulatory Visit: Payer: Self-pay | Admitting: Cardiovascular Disease

## 2025-01-31 ENCOUNTER — Telehealth: Payer: Self-pay

## 2025-01-31 ENCOUNTER — Other Ambulatory Visit: Payer: Self-pay

## 2025-01-31 NOTE — Telephone Encounter (Signed)
 Left patient know we have not called about bcg . They are still on back order.

## 2025-01-31 NOTE — Telephone Encounter (Signed)
 Pts wife LM on triage line  She states she is returning a call. Some one called her daughter to set up BCGS appts.   Will send to [facility]??

## 2025-03-12 ENCOUNTER — Encounter (INDEPENDENT_AMBULATORY_CARE_PROVIDER_SITE_OTHER)

## 2025-03-12 ENCOUNTER — Ambulatory Visit (INDEPENDENT_AMBULATORY_CARE_PROVIDER_SITE_OTHER): Admitting: Vascular Surgery

## 2025-04-13 ENCOUNTER — Ambulatory Visit: Admitting: Internal Medicine
